# Patient Record
Sex: Female | Born: 1950
Health system: Southern US, Community
[De-identification: ages and names within clinical notes are randomized; demographics above are authoritative.]

## PROBLEM LIST (undated history)

## (undated) DIAGNOSIS — N189 Chronic kidney disease, unspecified: Secondary | ICD-10-CM

## (undated) DIAGNOSIS — H269 Unspecified cataract: Secondary | ICD-10-CM

## (undated) DIAGNOSIS — L309 Dermatitis, unspecified: Secondary | ICD-10-CM

## (undated) DIAGNOSIS — R0600 Dyspnea, unspecified: Secondary | ICD-10-CM

## (undated) DIAGNOSIS — D649 Anemia, unspecified: Secondary | ICD-10-CM

## (undated) DIAGNOSIS — M5090 Cervical disc disorder, unspecified, unspecified cervical region: Secondary | ICD-10-CM

## (undated) DIAGNOSIS — J302 Other seasonal allergic rhinitis: Secondary | ICD-10-CM

## (undated) DIAGNOSIS — I10 Essential (primary) hypertension: Secondary | ICD-10-CM

## (undated) DIAGNOSIS — H409 Unspecified glaucoma: Secondary | ICD-10-CM

## (undated) DIAGNOSIS — E041 Nontoxic single thyroid nodule: Secondary | ICD-10-CM

## (undated) DIAGNOSIS — E785 Hyperlipidemia, unspecified: Secondary | ICD-10-CM

## (undated) DIAGNOSIS — E119 Type 2 diabetes mellitus without complications: Secondary | ICD-10-CM

## (undated) DIAGNOSIS — Z8619 Personal history of other infectious and parasitic diseases: Secondary | ICD-10-CM

## (undated) DIAGNOSIS — R011 Cardiac murmur, unspecified: Secondary | ICD-10-CM

## (undated) DIAGNOSIS — M1990 Unspecified osteoarthritis, unspecified site: Secondary | ICD-10-CM

## (undated) HISTORY — PX: UPPER GI ENDOSCOPY: SHX6162

## (undated) HISTORY — PX: TUBAL LIGATION: SHX77

## (undated) HISTORY — DX: Type 2 diabetes mellitus without complications: E11.9

## (undated) HISTORY — DX: Nontoxic single thyroid nodule: E04.1

## (undated) HISTORY — DX: Personal history of other infectious and parasitic diseases: Z86.19

## (undated) HISTORY — PX: TONSILLECTOMY: SUR1361

## (undated) HISTORY — DX: Cervical disc disorder, unspecified, unspecified cervical region: M50.90

## (undated) HISTORY — PX: COLONOSCOPY: SHX174

---

## 1997-07-03 ENCOUNTER — Other Ambulatory Visit: Admission: RE | Admit: 1997-07-03 | Discharge: 1997-07-03 | Payer: Self-pay | Admitting: Family Medicine

## 1997-07-11 ENCOUNTER — Ambulatory Visit (HOSPITAL_COMMUNITY): Admission: RE | Admit: 1997-07-11 | Discharge: 1997-07-11 | Payer: Self-pay | Admitting: Family Medicine

## 1997-09-08 ENCOUNTER — Other Ambulatory Visit: Admission: RE | Admit: 1997-09-08 | Discharge: 1997-09-08 | Payer: Self-pay | Admitting: Family Medicine

## 2000-06-17 ENCOUNTER — Ambulatory Visit (HOSPITAL_COMMUNITY): Admission: RE | Admit: 2000-06-17 | Discharge: 2000-06-17 | Payer: Self-pay | Admitting: Gastroenterology

## 2000-06-25 ENCOUNTER — Ambulatory Visit (HOSPITAL_COMMUNITY): Admission: RE | Admit: 2000-06-25 | Discharge: 2000-06-25 | Payer: Self-pay | Admitting: Family Medicine

## 2000-06-25 ENCOUNTER — Encounter: Payer: Self-pay | Admitting: Family Medicine

## 2001-06-20 ENCOUNTER — Encounter: Admission: RE | Admit: 2001-06-20 | Discharge: 2001-09-18 | Payer: Self-pay | Admitting: Family Medicine

## 2001-06-27 ENCOUNTER — Ambulatory Visit (HOSPITAL_COMMUNITY): Admission: RE | Admit: 2001-06-27 | Discharge: 2001-06-27 | Payer: Self-pay | Admitting: Family Medicine

## 2001-06-27 ENCOUNTER — Encounter: Payer: Self-pay | Admitting: Family Medicine

## 2001-10-06 ENCOUNTER — Encounter: Admission: RE | Admit: 2001-10-06 | Discharge: 2001-10-18 | Payer: Self-pay | Admitting: Family Medicine

## 2003-11-22 ENCOUNTER — Emergency Department (HOSPITAL_COMMUNITY): Admission: EM | Admit: 2003-11-22 | Discharge: 2003-11-22 | Payer: Self-pay | Admitting: Emergency Medicine

## 2004-07-01 ENCOUNTER — Ambulatory Visit (HOSPITAL_COMMUNITY): Admission: RE | Admit: 2004-07-01 | Discharge: 2004-07-01 | Payer: Self-pay | Admitting: Internal Medicine

## 2004-07-17 ENCOUNTER — Encounter: Admission: RE | Admit: 2004-07-17 | Discharge: 2004-07-17 | Payer: Self-pay | Admitting: Internal Medicine

## 2006-02-15 ENCOUNTER — Emergency Department (HOSPITAL_COMMUNITY): Admission: EM | Admit: 2006-02-15 | Discharge: 2006-02-15 | Payer: Self-pay | Admitting: Family Medicine

## 2006-09-16 ENCOUNTER — Encounter: Admission: RE | Admit: 2006-09-16 | Discharge: 2006-09-16 | Payer: Self-pay | Admitting: Internal Medicine

## 2007-02-02 ENCOUNTER — Emergency Department (HOSPITAL_COMMUNITY): Admission: EM | Admit: 2007-02-02 | Discharge: 2007-02-02 | Payer: Self-pay | Admitting: Emergency Medicine

## 2008-07-12 ENCOUNTER — Encounter: Admission: RE | Admit: 2008-07-12 | Discharge: 2008-07-12 | Payer: Self-pay | Admitting: Internal Medicine

## 2009-07-29 ENCOUNTER — Encounter: Admission: RE | Admit: 2009-07-29 | Discharge: 2009-10-04 | Payer: Self-pay | Admitting: Internal Medicine

## 2009-10-24 ENCOUNTER — Encounter: Admission: RE | Admit: 2009-10-24 | Discharge: 2009-10-24 | Payer: Self-pay | Admitting: Internal Medicine

## 2010-01-26 ENCOUNTER — Encounter: Payer: Self-pay | Admitting: Internal Medicine

## 2010-01-27 ENCOUNTER — Encounter: Payer: Self-pay | Admitting: Internal Medicine

## 2010-05-23 NOTE — Procedures (Signed)
[facility]  Patient:    Michelle Cummings, Michelle Cummings                         MRN: [id number] Proc. Date: 06/17/00 Adm. Date:  [id number] Attending:  Juanita Craver CC:         Emeline General. Dema Severin, M.D.   Procedure Report  DATE OF BIRTH:  1950-02-17.  REFERRING PHYSICIAN:  Emeline General. Dema Severin, M.D.  PROCEDURE PERFORMED:  Colonoscopy.  ENDOSCOPIST:  Nelwyn Salisbury, M.D.  INSTRUMENT USED:  Olympus video colonoscope.  INDICATIONS FOR PROCEDURE:  The patient is a 60 year old African-American female with a history of adenomatous polyps removed in the past.  Repeat colorectal cancer screening is being done to rule out recurrent polyps.  PREPROCEDURE PREPARATION:  Informed consent was procured from the patient. The patient was fasted for eight hours prior to the procedure and prepped with a bottle of magnesium citrate and a gallon of NuLytely the night prior to the procedure.  PREPROCEDURE PHYSICAL:  The patient had stable vital signs.  Neck supple. Chest clear to auscultation.  S1, S2 regular.  Abdomen soft with normal abdominal bowel sounds.  DESCRIPTION OF PROCEDURE:  The patient was placed in the left lateral decubitus position and sedated with 60 mg of Demerol and 7.5 mg of Versed intravenously.  Once the patient was adequately sedated and maintained on low-flow oxygen and continuous cardiac monitoring, the Olympus video colonoscope was advanced from the rectum to the cecum with slight difficulty secondary to some residual stool in the colon especially the transverse and right colon.  No masses, polyps, erosions, ulcerations or diverticula were seen.  Small lesions could have been missed.  The patient tolerated the procedure well without complication.  IMPRESSION: 1. Healthy-appearing colon except for some residual stool in the colon. 2. No large masses or polyps seen. 3. Very small lesions may have been missed.  RECOMMENDATIONS: 1. Repeat colorectal  cancer screening is recommended in the next five years. 2. A high fiber diet has been advised. 3. Outpatient follow-up is advised on a p.r.n. basis.DD:  06/17/00 TD:  06/18/00 Job: [id number]

## 2010-05-23 NOTE — Consult Note (Signed)
NAMEMIRIAN, CAISSIE NO.:  [id number]   MEDICAL RECORD NO.:  [id number]          PATIENT TYPE:  EMS   LOCATION:  ED                           FACILITY:  [facility]   PHYSICIAN:  Anderson Malta, M.D.    DATE OF BIRTH:  20-Feb-1950   DATE OF CONSULTATION:  11/22/2003  DATE OF DISCHARGE:                                   CONSULTATION   CHIEF COMPLAINT:  Right ankle pain.   HISTORY OF PRESENT ILLNESS:  Michelle Cummings is a 60 year old African-American  female, who fell today while she was walking in her wet leaves.  She reports  right ankle pain.  She denies any other orthopedic complaints.  She denies  any loss of consciousness.   CURRENT MEDICATIONS:  Amaryl, Lotrel, and Glucophage.   ALLERGIES:  She has no known drug allergies.   PAST MEDICAL AND SURGICAL HISTORY:  Notable for hypertension and colon  polyps.   SOCIAL HISTORY:  The patient does not smoke or drink.  She lives with her  husband in [place].  She has good family support with at least one  daughter in the area.   PHYSICAL EXAMINATION:  She has full range of motion of the upper extremities  and left lower extremity.  She has no neck pain or tenderness.  Her right  foot exam demonstrates intact sensation of the dorsal aspect of the foot,  palpable pedal pulses, swelling on the lateral aspect of the ankle with mild  tenderness, but there is no medial-sided tenderness.  She has good  dorsiflexion and plantar flexion of the toes.  Compartments are otherwise  soft.  No other masses, lymphadenopathy, or skin changes are noted.  Radiographs demonstrate Weber B ankle fracture with about 2 mm of  displacement.  The ankle mortis is symmetric.  Medial clear space is not  widened.   IMPRESSION:  Right ankle fracture.   PLAN:  The patient's right ankle was manipulated with reversal of her  supination, external rotation mechanism.  The patient's foot was pronated  and internally rotated.  Posterior splint was  applied.  Postreduction  radiographs demonstrate improvement in the alignment.  Because she is  diabetic, I would favor at this time nonoperative management with a period  of nonweightbearing.  I am going to see her back in the clinic in a week,  and we will proceed from there.  I am going to put her on Percocet for pain  as well as aspirin for DVT prophylaxis.  She will be nonweightbearing with  crutches.     GSD/MEDQ  D:  11/22/2003  T:  11/22/2003  Job:  [id number]   cc:   Dr. Rolin Barry

## 2010-05-23 NOTE — Op Note (Signed)
NAMEBREANNA, POULOS NO.:  [id number]   MEDICAL RECORD NO.:  [id number]          PATIENT TYPE:  EMS   LOCATION:  ED                           FACILITY:  [facility]   PHYSICIAN:  Anderson Malta, M.D.    DATE OF BIRTH:  Jun 21, 1950   DATE OF PROCEDURE:  11/22/2003  DATE OF DISCHARGE:                                 OPERATIVE REPORT   PREOPERATIVE DIAGNOSIS:  Right ankle fracture.   POSTOPERATIVE DIAGNOSIS:  Right ankle fracture.   PROCEDURE:  Closed reduction of right ankle fracture.   SURGEON:  Anderson Malta, M.D.   PROCEDURE:  Patient was given 4 mg of IV morphine.  The posterior splint  material was applied.  Using a reduction maneuver of pronation and internal  rotation via supination and external rotation, the ankle fracture was  reduced.  The plaster was allowed to harden while the foot was held in  internal rotation and pronation.  Post-reduction x-ray showed improvement in  the alignment of the fracture.  The patient tolerated the procedure well  without immediate complication.      GSD/MEDQ  D:  11/22/2003  T:  11/22/2003  Job:  [id number]

## 2011-03-05 ENCOUNTER — Encounter (HOSPITAL_COMMUNITY): Payer: Self-pay | Admitting: Internal Medicine

## 2011-03-05 ENCOUNTER — Encounter (HOSPITAL_COMMUNITY): Payer: Self-pay | Admitting: Certified Registered"

## 2011-03-05 ENCOUNTER — Emergency Department (HOSPITAL_COMMUNITY): Payer: BC Managed Care – PPO

## 2011-03-05 ENCOUNTER — Encounter (HOSPITAL_COMMUNITY)
Admission: EM | Disposition: A | Discharge status: Home / Self Care | Payer: Self-pay | Source: Ambulatory Visit | Attending: Emergency Medicine

## 2011-03-05 ENCOUNTER — Observation Stay (HOSPITAL_COMMUNITY)
Admission: EM | Admit: 2011-03-05 | Discharge: 2011-03-07 | DRG: 226 | Disposition: A | Discharge status: Home / Self Care | Payer: BC Managed Care – PPO | Source: Ambulatory Visit | Attending: Internal Medicine | Admitting: Internal Medicine

## 2011-03-05 ENCOUNTER — Inpatient Hospital Stay (HOSPITAL_COMMUNITY): Payer: BC Managed Care – PPO | Admitting: Certified Registered"

## 2011-03-05 ENCOUNTER — Other Ambulatory Visit: Payer: Self-pay

## 2011-03-05 DIAGNOSIS — R55 Syncope and collapse: Secondary | ICD-10-CM | POA: Diagnosis present

## 2011-03-05 DIAGNOSIS — S51009A Unspecified open wound of unspecified elbow, initial encounter: Principal | ICD-10-CM | POA: Insufficient documentation

## 2011-03-05 DIAGNOSIS — E119 Type 2 diabetes mellitus without complications: Secondary | ICD-10-CM | POA: Insufficient documentation

## 2011-03-05 DIAGNOSIS — S41112A Laceration without foreign body of left upper arm, initial encounter: Secondary | ICD-10-CM

## 2011-03-05 DIAGNOSIS — R197 Diarrhea, unspecified: Secondary | ICD-10-CM

## 2011-03-05 DIAGNOSIS — N183 Chronic kidney disease, stage 3 unspecified: Secondary | ICD-10-CM | POA: Diagnosis present

## 2011-03-05 DIAGNOSIS — W19XXXA Unspecified fall, initial encounter: Secondary | ICD-10-CM | POA: Insufficient documentation

## 2011-03-05 DIAGNOSIS — S51002A Unspecified open wound of left elbow, initial encounter: Secondary | ICD-10-CM | POA: Diagnosis present

## 2011-03-05 DIAGNOSIS — Z23 Encounter for immunization: Secondary | ICD-10-CM | POA: Insufficient documentation

## 2011-03-05 DIAGNOSIS — R110 Nausea: Secondary | ICD-10-CM | POA: Insufficient documentation

## 2011-03-05 DIAGNOSIS — S46909A Unspecified injury of unspecified muscle, fascia and tendon at shoulder and upper arm level, unspecified arm, initial encounter: Secondary | ICD-10-CM | POA: Insufficient documentation

## 2011-03-05 DIAGNOSIS — E1122 Type 2 diabetes mellitus with diabetic chronic kidney disease: Secondary | ICD-10-CM | POA: Diagnosis present

## 2011-03-05 DIAGNOSIS — I10 Essential (primary) hypertension: Secondary | ICD-10-CM

## 2011-03-05 DIAGNOSIS — E041 Nontoxic single thyroid nodule: Secondary | ICD-10-CM | POA: Diagnosis present

## 2011-03-05 DIAGNOSIS — Y92009 Unspecified place in unspecified non-institutional (private) residence as the place of occurrence of the external cause: Secondary | ICD-10-CM | POA: Insufficient documentation

## 2011-03-05 HISTORY — DX: Essential (primary) hypertension: I10

## 2011-03-05 HISTORY — PX: ORIF ELBOW FRACTURE: SHX5031

## 2011-03-05 LAB — POCT I-STAT, CHEM 8
BUN: 29 mg/dL — ABNORMAL HIGH (ref 6–23)
Calcium, Ion: 1.28 mmol/L (ref 1.12–1.32)
Chloride: 111 mEq/L (ref 96–112)
Creatinine, Ser: 0.9 mg/dL (ref 0.50–1.10)
Glucose, Bld: 249 mg/dL — ABNORMAL HIGH (ref 70–99)
HCT: 40 % (ref 36.0–46.0)
Hemoglobin: 13.6 g/dL (ref 12.0–15.0)
Potassium: 4.6 mEq/L (ref 3.5–5.1)
Sodium: 137 mEq/L (ref 135–145)
TCO2: 19 mmol/L (ref 0–100)

## 2011-03-05 LAB — GLUCOSE, CAPILLARY
Glucose-Capillary: 181 mg/dL — ABNORMAL HIGH (ref 70–99)
Glucose-Capillary: 167 mg/dL — ABNORMAL HIGH (ref 70–99)
Glucose-Capillary: 150 mg/dL — ABNORMAL HIGH (ref 70–99)
Glucose-Capillary: 156 mg/dL — ABNORMAL HIGH (ref 70–99)

## 2011-03-05 LAB — DIFFERENTIAL
Basophils Absolute: 0 10*3/uL (ref 0.0–0.1)
Basophils Relative: 0 % (ref 0–1)
Eosinophils Absolute: 0.2 10*3/uL (ref 0.0–0.7)
Eosinophils Relative: 1 % (ref 0–5)
Lymphocytes Relative: 14 % (ref 12–46)
Lymphs Abs: 1.4 10*3/uL (ref 0.7–4.0)
Monocytes Absolute: 1.1 10*3/uL — ABNORMAL HIGH (ref 0.1–1.0)
Monocytes Relative: 10 % (ref 3–12)
Neutro Abs: 8 10*3/uL — ABNORMAL HIGH (ref 1.7–7.7)
Neutrophils Relative %: 75 % (ref 43–77)

## 2011-03-05 LAB — COMPREHENSIVE METABOLIC PANEL
ALT: 32 U/L (ref 0–35)
AST: 15 U/L (ref 0–37)
Albumin: 3.3 g/dL — ABNORMAL LOW (ref 3.5–5.2)
Alkaline Phosphatase: 88 U/L (ref 39–117)
BUN: 26 mg/dL — ABNORMAL HIGH (ref 6–23)
CO2: 16 mEq/L — ABNORMAL LOW (ref 19–32)
Calcium: 9.3 mg/dL (ref 8.4–10.5)
Chloride: 108 mEq/L (ref 96–112)
Creatinine, Ser: 0.84 mg/dL (ref 0.50–1.10)
GFR calc Af Amer: 85 mL/min — ABNORMAL LOW (ref >90)
GFR calc non Af Amer: 73 mL/min — ABNORMAL LOW (ref >90)
Glucose, Bld: 179 mg/dL — ABNORMAL HIGH (ref 70–99)
Potassium: 4.2 mEq/L (ref 3.5–5.1)
Sodium: 136 mEq/L (ref 135–145)
Total Bilirubin: 0.2 mg/dL — ABNORMAL LOW (ref 0.3–1.2)
Total Protein: 7 g/dL (ref 6.0–8.3)

## 2011-03-05 LAB — CBC
HCT: 37.2 % (ref 36.0–46.0)
HCT: 34.5 % — ABNORMAL LOW (ref 36.0–46.0)
Hemoglobin: 12.1 g/dL (ref 12.0–15.0)
Hemoglobin: 10.7 g/dL — ABNORMAL LOW (ref 12.0–15.0)
MCH: 26.7 pg (ref 26.0–34.0)
MCH: 25.5 pg — ABNORMAL LOW (ref 26.0–34.0)
MCHC: 32.5 g/dL (ref 30.0–36.0)
MCHC: 31 g/dL (ref 30.0–36.0)
MCV: 81.9 fL (ref 78.0–100.0)
MCV: 82.3 fL (ref 78.0–100.0)
Platelets: 367 10*3/uL (ref 150–400)
Platelets: 315 10*3/uL (ref 150–400)
RBC: 4.54 MIL/uL (ref 3.87–5.11)
RBC: 4.19 MIL/uL (ref 3.87–5.11)
RDW: 13.9 % (ref 11.5–15.5)
RDW: 14.1 % (ref 11.5–15.5)
WBC: 10.6 10*3/uL — ABNORMAL HIGH (ref 4.0–10.5)
WBC: 7.2 10*3/uL (ref 4.0–10.5)

## 2011-03-05 LAB — TSH: TSH: 0.794 u[IU]/mL (ref 0.350–4.500)

## 2011-03-05 LAB — POCT I-STAT TROPONIN I: Troponin i, poc: 0 ng/mL (ref 0.00–0.08)

## 2011-03-05 SURGERY — OPEN REDUCTION INTERNAL FIXATION (ORIF) ELBOW/OLECRANON FRACTURE
Anesthesia: General | Site: Elbow | Laterality: Left | Wound class: Dirty or Infected

## 2011-03-05 MED ORDER — FENTANYL CITRATE 0.05 MG/ML IJ SOLN
50.0000 ug | Freq: Once | INTRAMUSCULAR | Status: AC
  Administered 2011-03-05: 50 ug via INTRAVENOUS

## 2011-03-05 MED ORDER — MEPERIDINE HCL 25 MG/ML IJ SOLN: 6.2500 mg | INTRAMUSCULAR | Status: DC | PRN

## 2011-03-05 MED ORDER — INSULIN ASPART 100 UNIT/ML ~~LOC~~ SOLN
0.0000 [IU] | Freq: Three times a day (TID) | SUBCUTANEOUS | Status: DC
  Administered 2011-03-06: 3 [IU] via SUBCUTANEOUS
  Filled 2011-03-05: qty 3

## 2011-03-05 MED ORDER — OXYCODONE-ACETAMINOPHEN 5-325 MG PO TABS
1.0000 | ORAL_TABLET | ORAL | Status: DC | PRN
  Administered 2011-03-05 – 2011-03-06 (×2): 2 via ORAL
  Filled 2011-03-05 (×2): qty 2

## 2011-03-05 MED ORDER — SODIUM CHLORIDE 0.9 % IV BOLUS (SEPSIS)
1000.0000 mL | Freq: Once | INTRAVENOUS | Status: AC
  Administered 2011-03-05: 1000 mL via INTRAVENOUS

## 2011-03-05 MED ORDER — LIDOCAINE HCL 4 % EX SOLN
CUTANEOUS | Status: AC
  Filled 2011-03-05: qty 100

## 2011-03-05 MED ORDER — PHENYLEPHRINE HCL 10 MG/ML IJ SOLN
INTRAMUSCULAR | Status: DC | PRN
  Administered 2011-03-05: 80 ug via INTRAVENOUS
  Administered 2011-03-05: 40 ug via INTRAVENOUS
  Administered 2011-03-05: 80 ug via INTRAVENOUS

## 2011-03-05 MED ORDER — ONDANSETRON HCL 4 MG/2ML IJ SOLN
INTRAMUSCULAR | Status: DC | PRN
  Administered 2011-03-05: 4 mg via INTRAVENOUS

## 2011-03-05 MED ORDER — CEFAZOLIN SODIUM 1-5 GM-% IV SOLN
1.0000 g | Freq: Three times a day (TID) | INTRAVENOUS | Status: AC
  Administered 2011-03-05 – 2011-03-06 (×3): 1 g via INTRAVENOUS
  Filled 2011-03-05 (×4): qty 50

## 2011-03-05 MED ORDER — PROPOFOL 10 MG/ML IV EMUL
INTRAVENOUS | Status: DC | PRN
  Administered 2011-03-05: 160 mg via INTRAVENOUS
  Administered 2011-03-05: 40 mg via INTRAVENOUS

## 2011-03-05 MED ORDER — FENTANYL CITRATE 0.05 MG/ML IJ SOLN
INTRAMUSCULAR | Status: AC
  Filled 2011-03-05: qty 2

## 2011-03-05 MED ORDER — LIDOCAINE-EPINEPHRINE 1 %-1:100000 IJ SOLN
INTRAMUSCULAR | Status: AC
  Administered 2011-03-05: 05:00:00
  Filled 2011-03-05: qty 2

## 2011-03-05 MED ORDER — CEFAZOLIN SODIUM 1-5 GM-% IV SOLN
INTRAVENOUS | Status: AC
  Filled 2011-03-05: qty 50

## 2011-03-05 MED ORDER — LABETALOL HCL 5 MG/ML IV SOLN
10.0000 mg | INTRAVENOUS | Status: DC | PRN
  Administered 2011-03-05: 10 mg via INTRAVENOUS
  Filled 2011-03-05: qty 4

## 2011-03-05 MED ORDER — SODIUM CHLORIDE 0.9 % IJ SOLN: 3.0000 mL | Freq: Two times a day (BID) | INTRAMUSCULAR | Status: DC

## 2011-03-05 MED ORDER — ACETAMINOPHEN 650 MG RE SUPP: 650.0000 mg | Freq: Four times a day (QID) | RECTAL | Status: DC | PRN

## 2011-03-05 MED ORDER — MIDAZOLAM HCL 2 MG/2ML IJ SOLN: 2.0000 mg | INTRAMUSCULAR | Status: DC | PRN

## 2011-03-05 MED ORDER — 0.9 % SODIUM CHLORIDE (POUR BTL) OPTIME
TOPICAL | Status: DC | PRN
  Administered 2011-03-05: 1000 mL

## 2011-03-05 MED ORDER — FENTANYL CITRATE 0.05 MG/ML IJ SOLN: 100.0000 ug | INTRAMUSCULAR | Status: DC | PRN

## 2011-03-05 MED ORDER — ONDANSETRON HCL 4 MG/2ML IJ SOLN: 4.0000 mg | Freq: Four times a day (QID) | INTRAMUSCULAR | Status: DC | PRN

## 2011-03-05 MED ORDER — MIDAZOLAM HCL 5 MG/5ML IJ SOLN
INTRAMUSCULAR | Status: DC | PRN
  Administered 2011-03-05: 2 mg via INTRAVENOUS

## 2011-03-05 MED ORDER — SODIUM CHLORIDE 0.9 % IV SOLN
INTRAVENOUS | Status: DC | PRN
  Administered 2011-03-05 (×2): via INTRAVENOUS

## 2011-03-05 MED ORDER — TETANUS-DIPHTH-ACELL PERTUSSIS 5-2.5-18.5 LF-MCG/0.5 IM SUSP
0.5000 mL | Freq: Once | INTRAMUSCULAR | Status: AC
  Administered 2011-03-05: 0.5 mL via INTRAMUSCULAR
  Filled 2011-03-05: qty 0.5

## 2011-03-05 MED ORDER — FENTANYL CITRATE 0.05 MG/ML IJ SOLN
INTRAMUSCULAR | Status: DC | PRN
  Administered 2011-03-05 (×4): 50 ug via INTRAVENOUS

## 2011-03-05 MED ORDER — HYDROCODONE-ACETAMINOPHEN 5-325 MG PO TABS
1.0000 | ORAL_TABLET | ORAL | Status: DC | PRN
Start: 2011-03-05 — End: 2011-03-07
  Administered 2011-03-06: 2 via ORAL
  Filled 2011-03-05: qty 2

## 2011-03-05 MED ORDER — HYDROMORPHONE HCL PF 1 MG/ML IJ SOLN
0.2500 mg | INTRAMUSCULAR | Status: DC | PRN
Start: 2011-03-05 — End: 2011-03-05
  Administered 2011-03-05 (×2): 0.5 mg via INTRAVENOUS

## 2011-03-05 MED ORDER — MORPHINE SULFATE 2 MG/ML IJ SOLN: 1.0000 mg | INTRAMUSCULAR | Status: DC | PRN

## 2011-03-05 MED ORDER — SODIUM CHLORIDE 0.9 % IV SOLN
INTRAVENOUS | Status: DC
  Administered 2011-03-05 – 2011-03-07 (×5): via INTRAVENOUS

## 2011-03-05 MED ORDER — FENTANYL CITRATE 0.05 MG/ML IJ SOLN
100.0000 ug | Freq: Once | INTRAMUSCULAR | Status: AC
  Administered 2011-03-05: 100 ug via INTRAVENOUS

## 2011-03-05 MED ORDER — ONDANSETRON HCL 4 MG PO TABS: 4.0000 mg | ORAL_TABLET | Freq: Four times a day (QID) | ORAL | Status: DC | PRN

## 2011-03-05 MED ORDER — CEFAZOLIN SODIUM 1-5 GM-% IV SOLN: 1.0000 g | INTRAVENOUS | Status: DC

## 2011-03-05 MED ORDER — CEFAZOLIN SODIUM 1-5 GM-% IV SOLN
1.0000 g | Freq: Once | INTRAVENOUS | Status: AC
  Administered 2011-03-05 (×2): 1 g via INTRAVENOUS
  Filled 2011-03-05: qty 50

## 2011-03-05 MED ORDER — ACETAMINOPHEN 325 MG PO TABS
650.0000 mg | ORAL_TABLET | Freq: Four times a day (QID) | ORAL | Status: DC | PRN
  Administered 2011-03-05: 650 mg via ORAL
  Filled 2011-03-05: qty 2

## 2011-03-05 MED ORDER — BUPIVACAINE HCL (PF) 0.25 % IJ SOLN
INTRAMUSCULAR | Status: DC | PRN
  Administered 2011-03-05: 10 mL

## 2011-03-05 SURGICAL SUPPLY — 58 items
ANCHOR SCREW TI W/SUT 3 (Screw) ×4 IMPLANT
BANDAGE ELASTIC 3 VELCRO ST LF (GAUZE/BANDAGES/DRESSINGS) ×2 IMPLANT
BANDAGE ELASTIC 4 VELCRO ST LF (GAUZE/BANDAGES/DRESSINGS) ×2 IMPLANT
BANDAGE GAUZE ELAST BULKY 4 IN (GAUZE/BANDAGES/DRESSINGS) ×2 IMPLANT
BNDG COHESIVE 4X5 TAN STRL (GAUZE/BANDAGES/DRESSINGS) ×2 IMPLANT
BNDG ESMARK 4X9 LF (GAUZE/BANDAGES/DRESSINGS) ×2 IMPLANT
CLOTH BEACON ORANGE TIMEOUT ST (SAFETY) ×2 IMPLANT
CORDS BIPOLAR (ELECTRODE) ×2 IMPLANT
COVER MAYO STAND STRL (DRAPES) IMPLANT
COVER SURGICAL LIGHT HANDLE (MISCELLANEOUS) ×4 IMPLANT
CUFF TOURNIQUET SINGLE 18IN (TOURNIQUET CUFF) ×2 IMPLANT
CUFF TOURNIQUET SINGLE 24IN (TOURNIQUET CUFF) IMPLANT
DRAPE INCISE IOBAN 66X45 STRL (DRAPES) IMPLANT
DRAPE OEC MINIVIEW 54X84 (DRAPES) IMPLANT
DRAPE ORTHO SPLIT 77X108 STRL (DRAPES) ×1
DRAPE SURG ORHT 6 SPLT 77X108 (DRAPES) ×1 IMPLANT
DRAPE U-SHAPE 47X51 STRL (DRAPES) ×2 IMPLANT
DRILL BIT 5/64 (BIT) ×2 IMPLANT
DRSG ADAPTIC 3X8 NADH LF (GAUZE/BANDAGES/DRESSINGS) ×2 IMPLANT
GLOVE BIOGEL PI IND STRL 7.0 (GLOVE) ×1 IMPLANT
GLOVE BIOGEL PI IND STRL 8 (GLOVE) ×1 IMPLANT
GLOVE BIOGEL PI IND STRL 8.5 (GLOVE) ×1 IMPLANT
GLOVE BIOGEL PI INDICATOR 7.0 (GLOVE) ×1
GLOVE BIOGEL PI INDICATOR 8 (GLOVE) ×1
GLOVE BIOGEL PI INDICATOR 8.5 (GLOVE) ×1
GLOVE ECLIPSE 6.5 STRL STRAW (GLOVE) ×2 IMPLANT
GLOVE EXAM NITRILE MD LF STRL (GLOVE) ×2 IMPLANT
GLOVE SURG ORTHO 8.0 STRL STRW (GLOVE) ×2 IMPLANT
GLOVE SURG SS PI 7.5 STRL IVOR (GLOVE) ×2 IMPLANT
GOWN PREVENTION PLUS XLARGE (GOWN DISPOSABLE) ×2 IMPLANT
GOWN SRG XL XLNG 56XLVL 4 (GOWN DISPOSABLE) ×1 IMPLANT
GOWN STRL NON-REIN LRG LVL3 (GOWN DISPOSABLE) ×2 IMPLANT
GOWN STRL NON-REIN XL XLG LVL4 (GOWN DISPOSABLE) ×1
KIT BASIN OR (CUSTOM PROCEDURE TRAY) ×2 IMPLANT
KIT ROOM TURNOVER OR (KITS) ×2 IMPLANT
LOOP VESSEL MAXI BLUE (MISCELLANEOUS) ×2 IMPLANT
MANIFOLD NEPTUNE II (INSTRUMENTS) ×2 IMPLANT
NEEDLE HYPO 25GX1X1/2 BEV (NEEDLE) ×2 IMPLANT
NS IRRIG 1000ML POUR BTL (IV SOLUTION) ×2 IMPLANT
PACK ORTHO EXTREMITY (CUSTOM PROCEDURE TRAY) ×2 IMPLANT
PAD ARMBOARD 7.5X6 YLW CONV (MISCELLANEOUS) ×4 IMPLANT
PAD CAST 4YDX4 CTTN HI CHSV (CAST SUPPLIES) ×1 IMPLANT
PADDING CAST COTTON 4X4 STRL (CAST SUPPLIES) ×1
SOAP 2 % CHG 4 OZ (WOUND CARE) ×2 IMPLANT
SPONGE GAUZE 4X4 12PLY (GAUZE/BANDAGES/DRESSINGS) ×2 IMPLANT
STAPLER VISISTAT 35W (STAPLE) ×2 IMPLANT
SUCTION FRAZIER TIP 10 FR DISP (SUCTIONS) ×2 IMPLANT
SUT MERSILENE 4 0 P 3 (SUTURE) IMPLANT
SUT PROLENE 3 0 PS 2 (SUTURE) ×4 IMPLANT
SUT PROLENE 4 0 PS 2 18 (SUTURE) IMPLANT
SUT VIC AB 2-0 CT1 27 (SUTURE)
SUT VIC AB 2-0 CT1 TAPERPNT 27 (SUTURE) IMPLANT
SYR CONTROL 10ML LL (SYRINGE) ×2 IMPLANT
TOWEL OR 17X24 6PK STRL BLUE (TOWEL DISPOSABLE) ×2 IMPLANT
TOWEL OR 17X26 10 PK STRL BLUE (TOWEL DISPOSABLE) ×4 IMPLANT
TUBE CONNECTING 12X1/4 (SUCTIONS) ×2 IMPLANT
UNDERPAD 30X30 INCONTINENT (UNDERPADS AND DIAPERS) ×2 IMPLANT
WATER STERILE IRR 1000ML POUR (IV SOLUTION) IMPLANT

## 2011-03-05 NOTE — Brief Op Note (Signed)
03/05/2011  4:06 PM  PATIENT:  Michelle Cummings  61 y.o. female  PRE-OPERATIVE DIAGNOSIS:  Elbow repair tendon laceration  POST-OPERATIVE DIAGNOSIS:  Elbow repair tendon laceration  PROCEDURE:  Procedure(s) (LRB): Open laceration repair and triceps tendon repair  SURGEON:  Surgeon(s) and Role:    * Linna Hoff, MD - Primary  PHYSICIAN ASSISTANT:   ASSISTANTS: none   ANESTHESIA:   general  EBL:  Total I/O In: 1000 [I.V.:1000] Out: -   BLOOD ADMINISTERED:none  DRAINS: none   LOCAL MEDICATIONS USED:  MARCAINE     SPECIMEN:  No Specimen  DISPOSITION OF SPECIMEN:  N/A  COUNTS:  YES  TOURNIQUET:  * Missing tourniquet times found for documented tourniquets in log:  26912 *  DICTATION: .Other Dictation: Dictation Number [id number]  PLAN OF CARE: Admit to inpatient   PATIENT DISPOSITION:  PACU - hemodynamically stable.   Delay start of Pharmacological VTE agent (>24hrs) due to surgical blood loss or risk of bleeding: not applicable

## 2011-03-05 NOTE — Anesthesia Postprocedure Evaluation (Signed)
  Anesthesia Post-op Note  Patient: Michelle Cummings  Procedure(s) Performed: Procedure(s) (LRB): OPEN REDUCTION INTERNAL FIXATION (ORIF) ELBOW/OLECRANON FRACTURE (Left)  Patient Location: PACU  Anesthesia Type: General  Level of Consciousness: awake and alert   Airway and Oxygen Therapy: Patient Spontanous Breathing and Patient connected to nasal cannula oxygen  Post-op Pain: mild  Post-op Assessment: Post-op Vital signs reviewed, Patient's Cardiovascular Status Stable, Respiratory Function Stable, Patent Airway and No signs of Nausea or vomiting  Post-op Vital Signs: Reviewed and stable  Complications: No apparent anesthesia complications

## 2011-03-05 NOTE — Anesthesia Preprocedure Evaluation (Addendum)
Anesthesia Evaluation  Patient identified by MRN, date of birth, ID band Patient awake    Reviewed: Allergy & Precautions, H&P , NPO status , Patient's Chart, lab work & pertinent test results  Airway Mallampati: II TM Distance: >3 FB Neck ROM: Full    Dental No notable dental hx. (+) Teeth Intact   Pulmonary neg pulmonary ROS,  clear to auscultation  Pulmonary exam normal       Cardiovascular hypertension, On Medications Regular Normal    Neuro/Psych Negative Neurological ROS  Negative Psych ROS   GI/Hepatic negative GI ROS, Neg liver ROS,   Endo/Other  Diabetes mellitus-, Well Controlled, Type 2, Oral Hypoglycemic Agents  Renal/GU negative Renal ROS  Genitourinary negative   Musculoskeletal   Abdominal   Peds  Hematology negative hematology ROS (+)   Anesthesia Other Findings   Reproductive/Obstetrics negative OB ROS                           Anesthesia Physical Anesthesia Plan  ASA: II  Anesthesia Plan: General   Post-op Pain Management:    Induction: Intravenous  Airway Management Planned: LMA  Additional Equipment:   Intra-op Plan:   Post-operative Plan: Extubation in OR  Informed Consent: I have reviewed the patients History and Physical, chart, labs and discussed the procedure including the risks, benefits and alternatives for the proposed anesthesia with the patient or authorized representative who has indicated his/her understanding and acceptance.     Plan Discussed with: CRNA  Anesthesia Plan Comments:         Anesthesia Quick Evaluation

## 2011-03-05 NOTE — Transfer of Care (Signed)
Immediate Anesthesia Transfer of Care Note  Patient: Michelle Cummings  Procedure(s) Performed: Procedure(s) (LRB): OPEN REDUCTION INTERNAL FIXATION (ORIF) ELBOW/OLECRANON FRACTURE (Left)  Patient Location: PACU  Anesthesia Type: General  Level of Consciousness: sedated  Airway & Oxygen Therapy: Patient Spontanous Breathing and Patient connected to nasal cannula oxygen  Post-op Assessment: Report given to PACU RN, Post -op Vital signs reviewed and stable and Patient moving all extremities X 4  Post vital signs: Reviewed and stable  Complications: No apparent anesthesia complications

## 2011-03-05 NOTE — Consult Note (Signed)
Reason for Consult:LACERATION TO LEFT ELBOW Referring Physician: [facility]  CHAREL TANCREDI is an 61 y.o. female.  HPI: SYNCOPAL EPISODE LAST NIGHT AND SUSTAINED LACERATION TO LEFT ELBOW PT HERE FOR SURGERY TODAY FOR LEFT ELBOW.  Past Medical History  Diagnosis Date  . Diabetes mellitus   . Hypertension     Past Surgical History  Procedure Date  . No past surgeries     History reviewed. No pertinent family history.  Social History:  reports that she has never smoked. She does not have any smokeless tobacco history on file. She reports that she does not drink alcohol. Her drug history not on file.  Allergies: No Known Allergies  Medications: I have reviewed the patient's current medications.  Results for orders placed during the hospital encounter of 03/05/11 (from the past 48 hour(s))  CBC     Status: Abnormal   Collection Time   03/05/11 12:25 AM      Component Value Range Comment   WBC 10.6 (*) 4.0 - 10.5 (K/uL)    RBC 4.54  3.87 - 5.11 (MIL/uL)    Hemoglobin 12.1  12.0 - 15.0 (g/dL)    HCT 37.2  36.0 - 46.0 (%)    MCV 81.9  78.0 - 100.0 (fL)    MCH 26.7  26.0 - 34.0 (pg)    MCHC 32.5  30.0 - 36.0 (g/dL)    RDW 13.9  11.5 - 15.5 (%)    Platelets 367  150 - 400 (K/uL)   DIFFERENTIAL     Status: Abnormal   Collection Time   03/05/11 12:25 AM      Component Value Range Comment   Neutrophils Relative 75  43 - 77 (%)    Neutro Abs 8.0 (*) 1.7 - 7.7 (K/uL)    Lymphocytes Relative 14  12 - 46 (%)    Lymphs Abs 1.4  0.7 - 4.0 (K/uL)    Monocytes Relative 10  3 - 12 (%)    Monocytes Absolute 1.1 (*) 0.1 - 1.0 (K/uL)    Eosinophils Relative 1  0 - 5 (%)    Eosinophils Absolute 0.2  0.0 - 0.7 (K/uL)    Basophils Relative 0  0 - 1 (%)    Basophils Absolute 0.0  0.0 - 0.1 (K/uL)   POCT I-STAT TROPONIN I     Status: Normal   Collection Time   03/05/11  3:39 AM      Component Value Range Comment   Troponin i, poc 0.00  0.00 - 0.08 (ng/mL)    Comment 3             POCT I-STAT, CHEM 8     Status: Abnormal   Collection Time   03/05/11  3:40 AM      Component Value Range Comment   Sodium 137  135 - 145 (mEq/L)    Potassium 4.6  3.5 - 5.1 (mEq/L)    Chloride 111  96 - 112 (mEq/L)    BUN 29 (*) 6 - 23 (mg/dL)    Creatinine, Ser 0.90  0.50 - 1.10 (mg/dL)    Glucose, Bld 249 (*) 70 - 99 (mg/dL)    Calcium, Ion 1.28  1.12 - 1.32 (mmol/L)    TCO2 19  0 - 100 (mmol/L)    Hemoglobin 13.6  12.0 - 15.0 (g/dL)    HCT 40.0  36.0 - 46.0 (%)   COMPREHENSIVE METABOLIC PANEL     Status: Abnormal   Collection Time  03/05/11  8:35 AM      Component Value Range Comment   Sodium 136  135 - 145 (mEq/L)    Potassium 4.2  3.5 - 5.1 (mEq/L)    Chloride 108  96 - 112 (mEq/L)    CO2 16 (*) 19 - 32 (mEq/L)    Glucose, Bld 179 (*) 70 - 99 (mg/dL)    BUN 26 (*) 6 - 23 (mg/dL)    Creatinine, Ser 0.84  0.50 - 1.10 (mg/dL)    Calcium 9.3  8.4 - 10.5 (mg/dL)    Total Protein 7.0  6.0 - 8.3 (g/dL)    Albumin 3.3 (*) 3.5 - 5.2 (g/dL)    AST 15  0 - 37 (U/L)    ALT 32  0 - 35 (U/L)    Alkaline Phosphatase 88  39 - 117 (U/L)    Total Bilirubin 0.2 (*) 0.3 - 1.2 (mg/dL)    GFR calc non Af Amer 73 (*) >90 (mL/min)    GFR calc Af Amer 85 (*) >90 (mL/min)   CBC     Status: Abnormal   Collection Time   03/05/11  8:35 AM      Component Value Range Comment   WBC 7.2  4.0 - 10.5 (K/uL)    RBC 4.19  3.87 - 5.11 (MIL/uL)    Hemoglobin 10.7 (*) 12.0 - 15.0 (g/dL) DELTA CHECK NOTED   HCT 34.5 (*) 36.0 - 46.0 (%)    MCV 82.3  78.0 - 100.0 (fL)    MCH 25.5 (*) 26.0 - 34.0 (pg)    MCHC 31.0  30.0 - 36.0 (g/dL)    RDW 14.1  11.5 - 15.5 (%)    Platelets 315  150 - 400 (K/uL)   GLUCOSE, CAPILLARY     Status: Abnormal   Collection Time   03/05/11  9:08 AM      Component Value Range Comment   Glucose-Capillary 181 (*) 70 - 99 (mg/dL)    Comment 1 Notify RN     GLUCOSE, CAPILLARY     Status: Abnormal   Collection Time   03/05/11 12:05 PM      Component Value Range Comment    Glucose-Capillary 167 (*) 70 - 99 (mg/dL)    Comment 1 Notify RN       Dg Chest 2 View  03/05/2011  *RADIOLOGY REPORT*  Clinical Data: Fall and fainted.  CHEST - 2 VIEW  Comparison: None.  Findings: Two views of the chest demonstrate clear lungs. Heart and mediastinum are within normal limits.  The trachea is midline. Bony structures are intact.  IMPRESSION: No acute chest findings.  Original Report Authenticated By: Markus Daft, M.D.   Dg Elbow Complete Left  03/05/2011  *RADIOLOGY REPORT*  Clinical Data: Fall and laceration to the left elbow.  LEFT ELBOW - COMPLETE 3+ VIEW  Comparison: None.  Findings: Four views of the left elbow were obtained.  There is lucency posterior to the elbow which is likely within the subcutaneous tissues.  Difficult to evaluate for joint effusion. There may be a skin laceration near the olecranon.  There is no evidence for a displaced elbow fracture.  The elbow is located.  IMPRESSION: No evidence for a gross or displaced elbow fracture.  Limited evaluation for a joint effusion due to the laceration and subcutaneous gas.  Original Report Authenticated By: Markus Daft, M.D.   Ct Head Wo Contrast  03/05/2011  *RADIOLOGY REPORT*  Clinical Data:  Laceration and fall.  CT  HEAD WITHOUT CONTRAST CT CERVICAL SPINE WITHOUT CONTRAST  Technique:  Multidetector CT imaging of the head and cervical spine was performed following the standard protocol without intravenous contrast.  Multiplanar CT image reconstructions of the cervical spine were also generated.  Comparison:   None  CT HEAD  Findings: No evidence for acute hemorrhage, mass lesion, midline shift, hydrocephalus or large infarct.  No acute bony abnormality. The visualized sinuses are clear.  IMPRESSION: No acute intracranial abnormality.  CT CERVICAL SPINE  Findings: Negative for acute fracture or dislocation.  Lung apices are clear.  There is a heterogeneous nodule along the inferior thyroid that measures 3.5 x 2.4 cm.  Mild  cervical spondylosis in the lower cervical spine.  Normal alignment of the cervical spine.  IMPRESSION: No acute bony abnormality in the cervical spine.  Large inferior thyroid nodule measuring up to 3.5 cm.  Recommend ultrasound evaluation and possible biopsy of this lesion.  Original Report Authenticated By: Markus Daft, M.D.   Ct Cervical Spine Wo Contrast  03/05/2011  *RADIOLOGY REPORT*  Clinical Data:  Laceration and fall.  CT HEAD WITHOUT CONTRAST CT CERVICAL SPINE WITHOUT CONTRAST  Technique:  Multidetector CT imaging of the head and cervical spine was performed following the standard protocol without intravenous contrast.  Multiplanar CT image reconstructions of the cervical spine were also generated.  Comparison:   None  CT HEAD  Findings: No evidence for acute hemorrhage, mass lesion, midline shift, hydrocephalus or large infarct.  No acute bony abnormality. The visualized sinuses are clear.  IMPRESSION: No acute intracranial abnormality.  CT CERVICAL SPINE  Findings: Negative for acute fracture or dislocation.  Lung apices are clear.  There is a heterogeneous nodule along the inferior thyroid that measures 3.5 x 2.4 cm.  Mild cervical spondylosis in the lower cervical spine.  Normal alignment of the cervical spine.  IMPRESSION: No acute bony abnormality in the cervical spine.  Large inferior thyroid nodule measuring up to 3.5 cm.  Recommend ultrasound evaluation and possible biopsy of this lesion.  Original Report Authenticated By: Markus Daft, M.D.    @ROS @ Blood pressure 156/70, pulse 90, temperature 98.9 F (37.2 C), temperature source Oral, resp. rate 20, SpO2 98.00%. General Appearance:  Alert, cooperative, no distress, appears stated age  Head:  Normocephalic, without obvious abnormality, atraumatic  Eyes:  Pupils equal, conjunctiva/corneas clear,         Throat: Lips, mucosa, and tongue normal; teeth and gums normal  Neck: No visible masses     Lungs:   respirations unlabored  Chest  Wall:  No tenderness or deformity  Heart:  Regular rate and rhythm,  Abdomen:   Soft, non-tender,         Extremities: LEFT ELBOW IN DRESSING, I DID LOOK AT WOUND WHEN SHE CAME TO ED, LARGE LACERATION WITH LACERATION OF TRICEPS INSERTION AND TENDON ABLE TO FLEX AND EXTEND DIGITS FINGERS WARM WELL PERFUSED  Pulses: 2+ and symmetric  Skin: Skin color, texture, turgor normal, no rashes or lesions     Neurologic: Normal    Assessment/Plan: LEFT ELBOW LACERATION WITH TENDON INVOLVEMENT  TO OR FOR DEFINITIVE REPAIR OF ELBOW LACERATION AND REPAIR OF TRICEPS TENDON  R/B/A DISCUSSED WITH PT IN HOLDING AREA.  PT VOICED UNDERSTANDING OF PLAN CONSENT SIGNED DAY OF SURGERY PT SEEN AND EXAMINED PRIOR TO OPERATIVE PROCEDURE/DAY OF SURGERY SITE MARKED. QUESTIONS ANSWERED WILL REMAIN AN INPATIENT FOLLOWING SURGERY  Michelle Cummings 03/05/2011, 2:21 PM

## 2011-03-05 NOTE — ED Notes (Signed)
Cervical Collar removed per Dr. Lynnae January orders. Suture cart at bedside. Dr. Randal Buba aware. Will continue to monitor.

## 2011-03-05 NOTE — ED Provider Notes (Signed)
History     CSN: [id number]  Arrival date & time 03/05/11  0014   First MD Initiated Contact with Patient 03/05/11 0020      Chief Complaint  Patient presents with  . Near Syncope  . Laceration  . Fall    (Consider location/radiation/quality/duration/timing/severity/associated sxs/prior treatment) Patient is a 61 y.o. female presenting with skin laceration, fall, and syncope. The history is provided by the patient. No language interpreter was used.  Laceration  The incident occurred less than 1 hour ago. The laceration is located on the left arm. The laceration is 11-20 cm in size. The laceration mechanism was a broken glass. The pain is at a severity of 10/10. The pain is severe. The pain has been constant since onset. She reports no foreign bodies present. Her tetanus status is out of date.  Fall The accident occurred less than 1 hour ago. The fall occurred while walking. She fell from a height of 1 to 2 ft. She landed on grass. The volume of blood lost was minimal. The point of impact was the left elbow. The pain is at a severity of 9/10. The pain is severe. She was ambulatory at the scene. There was no entrapment after the fall. There was no drug use involved in the accident. There was no alcohol use involved in the accident. Associated symptoms include nausea. Pertinent negatives include no visual change, no fever, no numbness, no abdominal pain, no vomiting, no hematuria, no headaches, no hearing loss and no loss of consciousness. The symptoms are aggravated by activity. She has tried nothing for the symptoms. The treatment provided no relief.  Loss of Consciousness This is a new problem. The current episode started less than 1 hour ago. The problem occurs rarely. The problem has been resolved. Pertinent negatives include no chest pain, no abdominal pain, no headaches and no shortness of breath. The symptoms are aggravated by nothing. The symptoms are relieved by nothing. She has tried  nothing for the symptoms. The treatment provided significant relief.    No past medical history on file.  No past surgical history on file.  No family history on file.  History  Substance Use Topics  . Smoking status: Not on file  . Smokeless tobacco: Not on file  . Alcohol Use: Not on file    OB History    No data available      Review of Systems  Constitutional: Negative.  Negative for fever.  HENT: Negative.   Eyes: Negative.   Respiratory: Negative for shortness of breath.   Cardiovascular: Positive for syncope. Negative for chest pain.  Gastrointestinal: Positive for nausea and diarrhea. Negative for vomiting and abdominal pain.  Genitourinary: Negative.  Negative for hematuria.  Musculoskeletal: Negative.   Skin: Negative.   Neurological: Negative.  Negative for loss of consciousness, numbness and headaches.  Hematological: Negative.   Psychiatric/Behavioral: Negative.   All other systems reviewed and are negative.    Allergies  Review of patient's allergies indicates no known allergies.  Home Medications   Current Outpatient Rx  Name Route Sig Dispense Refill  . LOSARTAN POTASSIUM 100 MG PO TABS Oral Take 100 mg by mouth daily.    Marland Kitchen METFORMIN HCL 1000 MG PO TABS Oral Take 1,000 mg by mouth 2 (two) times daily with a meal.      BP 155/58  Pulse 102  Temp(Src) 98.2 F (36.8 C) (Oral)  Resp 16  SpO2 99%  Physical Exam  Constitutional: She is oriented to  person, place, and time. She appears well-developed and well-nourished. No distress.  HENT:  Head: Normocephalic and atraumatic.  Right Ear: No hemotympanum.  Left Ear: No hemotympanum.  Mouth/Throat: Oropharynx is clear and moist.  Eyes: Conjunctivae are normal. Pupils are equal, round, and reactive to light.  Neck: Normal range of motion. Neck supple.  Cardiovascular: Normal rate and regular rhythm.   Pulmonary/Chest: Effort normal and breath sounds normal. She has no wheezes. She has no rales.    Abdominal: Soft. Bowel sounds are normal. There is tenderness. There is guarding. There is no rebound.  Musculoskeletal: She exhibits tenderness.       Arms:      Olecranon.  No snuff box tenderness of the left wrist left hand neurovascularly intact  Neurological: She is alert and oriented to person, place, and time. She has normal reflexes.  Skin: Skin is warm and dry.  Psychiatric: She has a normal mood and affect.    ED Course  Procedures (including critical care time)  Labs Reviewed  CBC - Abnormal; Notable for the following:    WBC 10.6 (*)    All other components within normal limits  DIFFERENTIAL - Abnormal; Notable for the following:    Neutro Abs 8.0 (*)    Monocytes Absolute 1.1 (*)    All other components within normal limits  URINE CULTURE  URINALYSIS, ROUTINE W REFLEX MICROSCOPIC   Dg Chest 2 View  03/05/2011  *RADIOLOGY REPORT*  Clinical Data: Fall and fainted.  CHEST - 2 VIEW  Comparison: None.  Findings: Two views of the chest demonstrate clear lungs. Heart and mediastinum are within normal limits.  The trachea is midline. Bony structures are intact.  IMPRESSION: No acute chest findings.  Original Report Authenticated By: Markus Daft, M.D.   Dg Elbow Complete Left  03/05/2011  *RADIOLOGY REPORT*  Clinical Data: Fall and laceration to the left elbow.  LEFT ELBOW - COMPLETE 3+ VIEW  Comparison: None.  Findings: Four views of the left elbow were obtained.  There is lucency posterior to the elbow which is likely within the subcutaneous tissues.  Difficult to evaluate for joint effusion. There may be a skin laceration near the olecranon.  There is no evidence for a displaced elbow fracture.  The elbow is located.  IMPRESSION: No evidence for a gross or displaced elbow fracture.  Limited evaluation for a joint effusion due to the laceration and subcutaneous gas.  Original Report Authenticated By: Markus Daft, M.D.   Ct Head Wo Contrast  03/05/2011  *RADIOLOGY REPORT*  Clinical  Data:  Laceration and fall.  CT HEAD WITHOUT CONTRAST CT CERVICAL SPINE WITHOUT CONTRAST  Technique:  Multidetector CT imaging of the head and cervical spine was performed following the standard protocol without intravenous contrast.  Multiplanar CT image reconstructions of the cervical spine were also generated.  Comparison:   None  CT HEAD  Findings: No evidence for acute hemorrhage, mass lesion, midline shift, hydrocephalus or large infarct.  No acute bony abnormality. The visualized sinuses are clear.  IMPRESSION: No acute intracranial abnormality.  CT CERVICAL SPINE  Findings: Negative for acute fracture or dislocation.  Lung apices are clear.  There is a heterogeneous nodule along the inferior thyroid that measures 3.5 x 2.4 cm.  Mild cervical spondylosis in the lower cervical spine.  Normal alignment of the cervical spine.  IMPRESSION: No acute bony abnormality in the cervical spine.  Large inferior thyroid nodule measuring up to 3.5 cm.  Recommend ultrasound evaluation and possible  biopsy of this lesion.  Original Report Authenticated By: Markus Daft, M.D.   Ct Cervical Spine Wo Contrast  03/05/2011  *RADIOLOGY REPORT*  Clinical Data:  Laceration and fall.  CT HEAD WITHOUT CONTRAST CT CERVICAL SPINE WITHOUT CONTRAST  Technique:  Multidetector CT imaging of the head and cervical spine was performed following the standard protocol without intravenous contrast.  Multiplanar CT image reconstructions of the cervical spine were also generated.  Comparison:   None  CT HEAD  Findings: No evidence for acute hemorrhage, mass lesion, midline shift, hydrocephalus or large infarct.  No acute bony abnormality. The visualized sinuses are clear.  IMPRESSION: No acute intracranial abnormality.  CT CERVICAL SPINE  Findings: Negative for acute fracture or dislocation.  Lung apices are clear.  There is a heterogeneous nodule along the inferior thyroid that measures 3.5 x 2.4 cm.  Mild cervical spondylosis in the lower cervical  spine.  Normal alignment of the cervical spine.  IMPRESSION: No acute bony abnormality in the cervical spine.  Large inferior thyroid nodule measuring up to 3.5 cm.  Recommend ultrasound evaluation and possible biopsy of this lesion.  Original Report Authenticated By: Markus Daft, M.D.     1. Syncope   2. Lacerations of multiple sites of left arm       MDM   Date: 03/05/2011  Rate: 94  Rhythm: normal sinus rhythm  QRS Axis: normal  Intervals: PR prolonged  ST/T Wave abnormalities: normal  Conduction Disutrbances:first-degree A-V block   Narrative Interpretation:   Old EKG Reviewed: none  LACERATION REPAIR Performed by: Carlisle Beers Authorized by: Carlisle Beers Consent: Verbal consent obtained. Risks and benefits: risks, benefits and alternatives were discussed Consent given by: patient Patient identity confirmed: provided demographic data Prepped and Draped in normal sterile fashion Wound explored  Laceration Location: olecranon left  Laceration Length: 11-12cm  No Foreign Bodies seen or palpated  Anesthesia: local infiltration  Local anesthetic: lidocaine 1%   Anesthetic total: 72ml  Irrigation method: syringe Amount of cleaning: extensive   Skin closure: staples  Number of sutures: 6 to loosely approximate until OR in am  Technique: staples loose  Patient tolerance: Patient tolerated the procedure well with no immediate complications.  LACERATION REPAIR Performed by: Carlisle Beers Authorized by: Carlisle Beers Consent: Verbal consent obtained. Risks and benefits: risks, benefits and alternatives were discussed Consent given by: patient Patient identity confirmed: provided demographic data Prepped and Draped in normal sterile fashion Wound explored  Laceration Location: posterior left arm just proximal to the olecranon  Laceration Length: 1.5 cm  No Foreign Bodies seen or palpated  Anesthesia: local  infiltration  Local anesthetic: lidocaine 1%   Anesthetic total: 2 ml  Irrigation method: syringe Amount of cleaning: extensive  Skin closure: 4.0 ethilon  Number of sutures: 4  Technique: simple  Patient tolerance: Patient tolerated the procedure well with no immediate complications.  LACERATION REPAIR Performed by: Carlisle Beers Authorized by: Carlisle Beers Consent: Verbal consent obtained. Risks and benefits: risks, benefits and alternatives were discussed Consent given by: patient Patient identity confirmed: provided demographic data Prepped and Draped in normal sterile fashion Wound explored  Laceration Location: just proximal to the left olecranon  Laceration Length: 1.5cm  No Foreign Bodies seen or palpated  Anesthesia: local infiltration  Local anesthetic: lidocaine 1%   Anesthetic total: 2 ml  Irrigation method: syringe Amount of cleaning: extensive  Skin closure: 4.0 ethilon  Number of sutures: 3  Technique:  simple  Patient tolerance: Patient tolerated the procedure  well with no immediate complications. LACERATION REPAIR Performed by: Carlisle Beers Authorized by: Carlisle Beers Consent: Verbal consent obtained. Risks and benefits: risks, benefits and alternatives were discussed Consent given by: patient Patient identity confirmed: provided demographic data Prepped and Draped in normal sterile fashion Wound explored  Laceration Location: just distal to the left olecranon  Laceration Length: 1.5 cm  No Foreign Bodies seen or palpated  Anesthesia: local infiltration  Local anesthetic: lidocaine 1%   Anesthetic total: 33ml  Irrigation method: syringe Amount of cleaning: extensive  Skin closure: 4.0 ethilon  Number of sutures: 3  Technique:  simple  Patient tolerance: Patient tolerated the procedure well with no immediate complications.  LACERATION REPAIR Performed by: Carlisle Beers Authorized by: Carlisle Beers Consent: Verbal consent obtained. Risks and benefits: risks, benefits and alternatives were discussed Consent given by: patient Patient identity confirmed: provided demographic data Prepped and Draped in normal sterile fashion Wound explored  Laceration Location: distal and medial to the left olecranon gouge  Laceration Length: 2cm  No Foreign Bodies seen or palpated  Anesthesia: local infiltration  Local anesthetic: lidocaine 1%   Anesthetic total: 2 ml  Irrigation method: syringe Amount of cleaning: extensive  Skin closure: 4.0 ethilon  Number of sutures: 2  Technique: simple  Patient tolerance: Patient tolerated the procedure well with no immediate complications.      Carlisle Beers, MD 03/05/11 [phone]

## 2011-03-05 NOTE — H&P (Signed)
Michelle Cummings is an 61 y.o. female.   PCP - Gabriel Carina. Chief Complaint: Loss of consciousness and left elbow injury. HPI: 61 year old female with known history of hypertension and diabetes mellitus type 2 presented to the ER the patient had a fall last night while going to the bathroom. Patient states since yesterday morning patient has been having multiple episodes of diarrhea. Last night when she tried to go to the bathroom she fell and lost consciousness. Her husband was there at the home called EMS and patient was brought to the ER. When she fell her left elbow hit the vase and she had a wound. In the ER patient had CT of the head neck chest x-rays EKG and basic labs which all does not show any acute except for a thyroid nodule. X-ray of the left elbow it's not very conclusive. At this time orthopedic surgeon on call Dr.Ortman has been consulted by ER physician Dr.Palumbo. Dr.Ortman is likely to take the patient to surgery. Patient denies any chest pain palpitation nausea vomiting abdominal pain or any blood in the diarrhea. Patient denies any focal deficits headache or visual symptoms. Patient has not had a syncopal episode previously.  Past Medical History  Diagnosis Date  . Diabetes mellitus   . Hypertension     Past Surgical History  Procedure Date  . No past surgeries     History reviewed. No pertinent family history. Social History:  reports that she has never smoked. She does not have any smokeless tobacco history on file. She reports that she does not drink alcohol. Her drug history not on file.  Allergies: No Known Allergies  Medications Prior to Admission  Medication Dose Route Frequency Provider Last Rate Last Dose  . ceFAZolin (ANCEF) IVPB 1 g/50 mL premix  1 g Intravenous Once April K Palumbo-Rasch, MD   1 g at 03/05/11 0132  . fentaNYL (SUBLIMAZE) injection 100 mcg  100 mcg Intravenous Once April K Palumbo-Rasch, MD   100 mcg at 03/05/11 0410  . fentaNYL (SUBLIMAZE)  injection 50 mcg  50 mcg Intravenous Once April K Palumbo-Rasch, MD   50 mcg at 03/05/11 0328  . lidocaine-EPINEPHrine (XYLOCAINE W/EPI) 1 %-1:100000 (with pres) injection           . sodium chloride 0.9 % bolus 1,000 mL  1,000 mL Intravenous Once April K Palumbo-Rasch, MD   1,000 mL at 03/05/11 0325  . TDaP (BOOSTRIX) injection 0.5 mL  0.5 mL Intramuscular Once April K Palumbo-Rasch, MD   0.5 mL at 03/05/11 0132  . DISCONTD: lidocaine (XYLOCAINE) 4 % external solution            No current outpatient prescriptions on file as of 03/05/2011.    Results for orders placed during the hospital encounter of 03/05/11 (from the past 48 hour(s))  CBC     Status: Abnormal   Collection Time   03/05/11 12:25 AM      Component Value Range Comment   WBC 10.6 (*) 4.0 - 10.5 (K/uL)    RBC 4.54  3.87 - 5.11 (MIL/uL)    Hemoglobin 12.1  12.0 - 15.0 (g/dL)    HCT 37.2  36.0 - 46.0 (%)    MCV 81.9  78.0 - 100.0 (fL)    MCH 26.7  26.0 - 34.0 (pg)    MCHC 32.5  30.0 - 36.0 (g/dL)    RDW 13.9  11.5 - 15.5 (%)    Platelets 367  150 - 400 (K/uL)  DIFFERENTIAL     Status: Abnormal   Collection Time   03/05/11 12:25 AM      Component Value Range Comment   Neutrophils Relative 75  43 - 77 (%)    Neutro Abs 8.0 (*) 1.7 - 7.7 (K/uL)    Lymphocytes Relative 14  12 - 46 (%)    Lymphs Abs 1.4  0.7 - 4.0 (K/uL)    Monocytes Relative 10  3 - 12 (%)    Monocytes Absolute 1.1 (*) 0.1 - 1.0 (K/uL)    Eosinophils Relative 1  0 - 5 (%)    Eosinophils Absolute 0.2  0.0 - 0.7 (K/uL)    Basophils Relative 0  0 - 1 (%)    Basophils Absolute 0.0  0.0 - 0.1 (K/uL)    Dg Chest 2 View  03/05/2011  *RADIOLOGY REPORT*  Clinical Data: Fall and fainted.  CHEST - 2 VIEW  Comparison: None.  Findings: Two views of the chest demonstrate clear lungs. Heart and mediastinum are within normal limits.  The trachea is midline. Bony structures are intact.  IMPRESSION: No acute chest findings.  Original Report Authenticated By: Markus Daft,  M.D.   Dg Elbow Complete Left  03/05/2011  *RADIOLOGY REPORT*  Clinical Data: Fall and laceration to the left elbow.  LEFT ELBOW - COMPLETE 3+ VIEW  Comparison: None.  Findings: Four views of the left elbow were obtained.  There is lucency posterior to the elbow which is likely within the subcutaneous tissues.  Difficult to evaluate for joint effusion. There may be a skin laceration near the olecranon.  There is no evidence for a displaced elbow fracture.  The elbow is located.  IMPRESSION: No evidence for a gross or displaced elbow fracture.  Limited evaluation for a joint effusion due to the laceration and subcutaneous gas.  Original Report Authenticated By: Markus Daft, M.D.   Ct Head Wo Contrast  03/05/2011  *RADIOLOGY REPORT*  Clinical Data:  Laceration and fall.  CT HEAD WITHOUT CONTRAST CT CERVICAL SPINE WITHOUT CONTRAST  Technique:  Multidetector CT imaging of the head and cervical spine was performed following the standard protocol without intravenous contrast.  Multiplanar CT image reconstructions of the cervical spine were also generated.  Comparison:   None  CT HEAD  Findings: No evidence for acute hemorrhage, mass lesion, midline shift, hydrocephalus or large infarct.  No acute bony abnormality. The visualized sinuses are clear.  IMPRESSION: No acute intracranial abnormality.  CT CERVICAL SPINE  Findings: Negative for acute fracture or dislocation.  Lung apices are clear.  There is a heterogeneous nodule along the inferior thyroid that measures 3.5 x 2.4 cm.  Mild cervical spondylosis in the lower cervical spine.  Normal alignment of the cervical spine.  IMPRESSION: No acute bony abnormality in the cervical spine.  Large inferior thyroid nodule measuring up to 3.5 cm.  Recommend ultrasound evaluation and possible biopsy of this lesion.  Original Report Authenticated By: Markus Daft, M.D.   Ct Cervical Spine Wo Contrast  03/05/2011  *RADIOLOGY REPORT*  Clinical Data:  Laceration and fall.  CT HEAD  WITHOUT CONTRAST CT CERVICAL SPINE WITHOUT CONTRAST  Technique:  Multidetector CT imaging of the head and cervical spine was performed following the standard protocol without intravenous contrast.  Multiplanar CT image reconstructions of the cervical spine were also generated.  Comparison:   None  CT HEAD  Findings: No evidence for acute hemorrhage, mass lesion, midline shift, hydrocephalus or large infarct.  No acute bony abnormality. The  visualized sinuses are clear.  IMPRESSION: No acute intracranial abnormality.  CT CERVICAL SPINE  Findings: Negative for acute fracture or dislocation.  Lung apices are clear.  There is a heterogeneous nodule along the inferior thyroid that measures 3.5 x 2.4 cm.  Mild cervical spondylosis in the lower cervical spine.  Normal alignment of the cervical spine.  IMPRESSION: No acute bony abnormality in the cervical spine.  Large inferior thyroid nodule measuring up to 3.5 cm.  Recommend ultrasound evaluation and possible biopsy of this lesion.  Original Report Authenticated By: Markus Daft, M.D.    Review of Systems  Constitutional: Negative.   HENT: Negative.   Eyes: Negative.   Respiratory: Negative.   Cardiovascular: Negative.   Gastrointestinal: Negative.   Genitourinary: Negative.   Musculoskeletal: Positive for falls.  Skin: Negative.   Neurological: Positive for loss of consciousness.  Endo/Heme/Allergies: Negative.     Blood pressure 148/69, pulse 96, temperature 98.2 F (36.8 C), temperature source Oral, resp. rate 24, SpO2 98.00%. Physical Exam  Constitutional: She is oriented to person, place, and time. She appears well-developed and well-nourished. No distress.  HENT:  Head: Normocephalic and atraumatic.  Right Ear: External ear normal.  Left Ear: External ear normal.  Nose: Nose normal.  Mouth/Throat: Oropharynx is clear and moist. No oropharyngeal exudate.  Eyes: Conjunctivae are normal. Pupils are equal, round, and reactive to light. Right eye  exhibits no discharge. Left eye exhibits no discharge. No scleral icterus.  Neck: Normal range of motion. Neck supple.  Cardiovascular: Normal rate, regular rhythm and normal heart sounds.   Respiratory: Effort normal and breath sounds normal. No respiratory distress. She has no wheezes. She has no rales.  GI: Soft. Bowel sounds are normal. She exhibits no distension. There is no tenderness. There is no rebound.  Musculoskeletal: Normal range of motion.       Left arm is dressed. Patient has good pulses.  Neurological: She is alert and oriented to person, place, and time. No cranial nerve deficit. Coordination normal.       Moves upper and lower extremities 5/5. No facial asymmetry.  Skin: Skin is warm and dry. No rash noted. She is not diaphoretic. No erythema.  Psychiatric: Her behavior is normal.     Assessment/Plan #1. Syncope probably precipitated by dehydration from diarrhea - at this time we will monitor patient in telemetry to rule out arrhythmias. We will aggressively hydrate patient with IV fluids. And recheck orthostatic blood pressures. #2. Left elbow injury - patient sustained a left elbow injury after the fall and patient is likely to go for surgery. Dr. Caralyn Guile has been consulted. Patient will be kept n.p.o. until then. #3. Diarrhea - this may be viral. Patient has not taken any antibiotics recently. We will check stool cultures and as suggested earlier aggressively hydrate. #4. Large thyroid nodule - I have discussed this with the patient. I have explained the patient will need further workup as outpatient for this and it is very important. For now we will check a TSH and free T4 level and make sure there is no hyperthyroidism. #5. History of hypertension - as patient is going to be n.p.o. I will keep patient on when necessary labetalol for systolic blood pressure more than 160. Her regular home medication can be resumed once patient starts taking by mouth. #6. History of diabetes  mellitus type 2 - as patient will be n.p.o. for possible surgery we will check CBG Q4 which can be changed to a.c. and at  bedtime once patient starts diet. Patient will be on sliding scale coverage.  CODE STATUS - full code.  Rise Patience. 03/05/2011, 6:15 AM

## 2011-03-05 NOTE — Progress Notes (Signed)
Utilization Review completed.  

## 2011-03-05 NOTE — Anesthesia Procedure Notes (Signed)
Procedure Name: LMA Insertion Date/Time: 03/05/2011 2:55 PM Performed by: Valetta Fuller Pre-anesthesia Checklist: Patient identified, Emergency Drugs available, Suction available and Patient being monitored Patient Re-evaluated:Patient Re-evaluated prior to inductionOxygen Delivery Method: Circle system utilized Preoxygenation: Pre-oxygenation with 100% oxygen Intubation Type: IV induction Ventilation: Mask ventilation without difficulty LMA: LMA inserted LMA Size: 4.0 Tube secured with: Tape Dental Injury: Teeth and Oropharynx as per pre-operative assessment

## 2011-03-05 NOTE — Preoperative (Signed)
Beta Blockers   Reason not to administer Beta Blockers:Not Applicable 

## 2011-03-05 NOTE — Progress Notes (Signed)
Patient seen and examined, admitted by Dr. Hal Hope this morning. Briefly, patient admitted with syncopal episode likely secondary to dehydration and diarrhea, had a fall and sustained left elbow injury. - Continue IV fluids and syncope workup - Plan for OR today for the left elbow injury - Will follow closely.   Montrey Buist M.D. [facility] 03/05/2011, 3:28 PM  Pager: [phone]

## 2011-03-05 NOTE — ED Notes (Signed)
Per EMS, pt has been having diarrhea. Pt was trying to go to the bathroom when she fell like she was going to "pass out". That is when she fell and LOC. She hit her left elbow on a glass vase. Pt is not sure if she hit her head.  Vitals: 172/86, CBG 198, HR 110 and NSR

## 2011-03-05 NOTE — Progress Notes (Signed)
POST OP PLAN: OK TO GO HOME KEEP SPLINT ON AT ALL TIMES NO USE OF LEFT ARM LEFT ARM IS TO KEEP STRAIGHT AT ALL TIMES CALL OFFICE FOR F/U IN 12 DAYS [phone]

## 2011-03-06 LAB — CBC
HCT: 30.5 % — ABNORMAL LOW (ref 36.0–46.0)
Hemoglobin: 9.6 g/dL — ABNORMAL LOW (ref 12.0–15.0)
MCH: 26.4 pg (ref 26.0–34.0)
MCHC: 31.5 g/dL (ref 30.0–36.0)
MCV: 84 fL (ref 78.0–100.0)
Platelets: 273 10*3/uL (ref 150–400)
RBC: 3.63 MIL/uL — ABNORMAL LOW (ref 3.87–5.11)
RDW: 14.3 % (ref 11.5–15.5)
WBC: 7.1 10*3/uL (ref 4.0–10.5)

## 2011-03-06 LAB — GLUCOSE, CAPILLARY
Glucose-Capillary: 143 mg/dL — ABNORMAL HIGH (ref 70–99)
Glucose-Capillary: 225 mg/dL — ABNORMAL HIGH (ref 70–99)
Glucose-Capillary: 100 mg/dL — ABNORMAL HIGH (ref 70–99)
Glucose-Capillary: 198 mg/dL — ABNORMAL HIGH (ref 70–99)

## 2011-03-06 LAB — BASIC METABOLIC PANEL
BUN: 16 mg/dL (ref 6–23)
CO2: 19 mEq/L (ref 19–32)
Calcium: 8.4 mg/dL (ref 8.4–10.5)
Chloride: 111 mEq/L (ref 96–112)
Creatinine, Ser: 0.8 mg/dL (ref 0.50–1.10)
GFR calc Af Amer: 90 mL/min — ABNORMAL LOW (ref >90)
GFR calc non Af Amer: 77 mL/min — ABNORMAL LOW (ref >90)
Glucose, Bld: 159 mg/dL — ABNORMAL HIGH (ref 70–99)
Potassium: 3.9 mEq/L (ref 3.5–5.1)
Sodium: 137 mEq/L (ref 135–145)

## 2011-03-06 MED ORDER — LOSARTAN POTASSIUM 50 MG PO TABS
100.0000 mg | ORAL_TABLET | Freq: Every day | ORAL | Status: DC
  Administered 2011-03-06 – 2011-03-07 (×2): 100 mg via ORAL
  Filled 2011-03-06 (×3): qty 2

## 2011-03-06 MED ORDER — METFORMIN HCL 500 MG PO TABS
1000.0000 mg | ORAL_TABLET | Freq: Two times a day (BID) | ORAL | Status: DC
  Administered 2011-03-07: 1000 mg via ORAL
  Filled 2011-03-06 (×4): qty 2

## 2011-03-06 NOTE — Progress Notes (Signed)
Patient ID: Michelle Cummings    [id number]    DOB: 1950-03-26    DOA: 03/05/2011  PCP: [name], MD, MD  Subjective: Pain controlled  Objective: Weight change:   Intake/Output Summary (Last 24 hours) at 03/06/11 1718 Last data filed at 03/06/11 0800  Gross per 24 hour  Intake    300 ml  Output      0 ml  Net    300 ml   Blood pressure 176/91, pulse 107, temperature 98.5 F (36.9 C), temperature source Oral, resp. rate 20, weight 84.641 kg (186 lb 9.6 oz), SpO2 98.00%.  Physical Exam: General: Alert and awake, oriented x3, not in any acute distress. HEENT: anicteric sclera, pupils reactive to light and accommodation, EOMI CVS: S1-S2 clear, no murmur rubs or gallops Chest: clear to auscultation bilaterally, no wheezing, rales or rhonchi Abdomen: soft nontender, nondistended, normal bowel sounds, no organomegaly Extremities: no cyanosis, clubbing or edema noted bilaterally, left elbow in cast  Neuro: Cranial nerves II-XII intact, no focal neurological deficits  Lab Results: Basic Metabolic Panel:  Lab [id number] 03/05/11 0835  NA 137 136  K 3.9 4.2  CL 111 108  CO2 19 16*  GLUCOSE 159* 179*  BUN 16 26*  CREATININE 0.80 0.84  CALCIUM 8.4 9.3  MG -- --  PHOS -- --   Liver Function Tests:  Lab 03/05/11 0835  AST 15  ALT 32  ALKPHOS 88  BILITOT 0.2*  PROT 7.0  ALBUMIN 3.3*   CBC:  Lab 03/06/11 0645 03/05/11 0835 03/05/11 0025  WBC 7.1 7.2 --  NEUTROABS -- -- 8.0*  HGB 9.6* 10.7* --  HCT 30.5* 34.5* --  MCV 84.0 82.3 --  PLT 273 315 --   CBG:  Lab 03/06/11 1646 03/06/11 1215 03/06/11 0758 03/05/11 2115 03/05/11 1634  GLUCAP 100* 225* 143* 156* 150*     Micro Results: No results found for this or any previous visit (from the past 240 hour(s)).  Studies/Results: Dg Chest 2 View  03/05/2011  *RADIOLOGY REPORT*  Clinical Data: Fall and fainted.  CHEST - 2 VIEW  Comparison: None.  Findings: Two views of the chest demonstrate clear lungs. Heart and  mediastinum are within normal limits.  The trachea is midline. Bony structures are intact.  IMPRESSION: No acute chest findings.  Original Report Authenticated By: Markus Daft, M.D.   Dg Elbow Complete Left  03/05/2011  *RADIOLOGY REPORT*  Clinical Data: Fall and laceration to the left elbow.  LEFT ELBOW - COMPLETE 3+ VIEW  Comparison: None.  Findings: Four views of the left elbow were obtained.  There is lucency posterior to the elbow which is likely within the subcutaneous tissues.  Difficult to evaluate for joint effusion. There may be a skin laceration near the olecranon.  There is no evidence for a displaced elbow fracture.  The elbow is located.  IMPRESSION: No evidence for a gross or displaced elbow fracture.  Limited evaluation for a joint effusion due to the laceration and subcutaneous gas.  Original Report Authenticated By: Markus Daft, M.D.   Ct Head Wo Contrast  03/05/2011  *RADIOLOGY REPORT*  Clinical Data:  Laceration and fall.  CT HEAD WITHOUT CONTRAST CT CERVICAL SPINE WITHOUT CONTRAST  Technique:  Multidetector CT imaging of the head and cervical spine was performed following the standard protocol without intravenous contrast.  Multiplanar CT image reconstructions of the cervical spine were also generated.  Comparison:   None  CT HEAD  Findings: No evidence for acute  hemorrhage, mass lesion, midline shift, hydrocephalus or large infarct.  No acute bony abnormality. The visualized sinuses are clear.  IMPRESSION: No acute intracranial abnormality.  CT CERVICAL SPINE  Findings: Negative for acute fracture or dislocation.  Lung apices are clear.  There is a heterogeneous nodule along the inferior thyroid that measures 3.5 x 2.4 cm.  Mild cervical spondylosis in the lower cervical spine.  Normal alignment of the cervical spine.  IMPRESSION: No acute bony abnormality in the cervical spine.  Large inferior thyroid nodule measuring up to 3.5 cm.  Recommend ultrasound evaluation and possible biopsy of this  lesion.  Original Report Authenticated By: Markus Daft, M.D.   Ct Cervical Spine Wo Contrast  03/05/2011  *RADIOLOGY REPORT*  Clinical Data:  Laceration and fall.  CT HEAD WITHOUT CONTRAST CT CERVICAL SPINE WITHOUT CONTRAST  Technique:  Multidetector CT imaging of the head and cervical spine was performed following the standard protocol without intravenous contrast.  Multiplanar CT image reconstructions of the cervical spine were also generated.  Comparison:   None  CT HEAD  Findings: No evidence for acute hemorrhage, mass lesion, midline shift, hydrocephalus or large infarct.  No acute bony abnormality. The visualized sinuses are clear.  IMPRESSION: No acute intracranial abnormality.  CT CERVICAL SPINE  Findings: Negative for acute fracture or dislocation.  Lung apices are clear.  There is a heterogeneous nodule along the inferior thyroid that measures 3.5 x 2.4 cm.  Mild cervical spondylosis in the lower cervical spine.  Normal alignment of the cervical spine.  IMPRESSION: No acute bony abnormality in the cervical spine.  Large inferior thyroid nodule measuring up to 3.5 cm.  Recommend ultrasound evaluation and possible biopsy of this lesion.  Original Report Authenticated By: Markus Daft, M.D.    Medications: Scheduled Meds:   .  ceFAZolin (ANCEF) IV  1 g Intravenous Q8H  . insulin aspart  0-9 Units Subcutaneous TID WC  . losartan  100 mg Oral Daily  . metFORMIN  1,000 mg Oral BID WC  . sodium chloride  3 mL Intravenous Q12H  . DISCONTD:  ceFAZolin (ANCEF) IV  1 g Intravenous NOW   Continuous Infusions:   . sodium chloride 100 mL/hr at 03/06/11 1055     Assessment/Plan: Principal Problem:  *Syncope: Vasovagal, Likely scheduled to dehydration - Continue gentle hydration today, nausea vomiting diarrhea resolved - PT evaluation ordered  Active Problems:  Diarrhea: Resolved per patient   Open wound of left elbow:  Status post surgery yesterday - Continue pain control, recommendations per  Dr. Caralyn Guile   HTN (hypertension): Uncontrolled - Restarted Cozaar, continue labetalol when necessary   Thyroid nodule: TSH 0.79 - check T4, T3, patient to followup with her PCP for further workup, will need a thyroid ultrasound and biopsy and a referral to endocrinologist.   Diabetes mellitus: Uncontrolled - Added metformin, continue sliding scale insulin  DVT Prophylaxis: SCDs  Code Status: Full Code  Disposition: Hopefully tomorrow am, await PT rec's   LOS: 1 day   Dabney Schanz M.D. [facility] 03/06/2011, 5:18 PM Pager: [phone]

## 2011-03-06 NOTE — Op Note (Signed)
NAMESALIA, Michelle Cummings NO.:  [id number]  MEDICAL RECORD NO.:  [id number]  LOCATION:  [id number]                         FACILITY:  [facility]  PHYSICIAN:  Michelle Nakayama, MD  DATE OF BIRTH:  05/30/1950  DATE OF PROCEDURE:  03/05/2011 DATE OF DISCHARGE:                              OPERATIVE REPORT   PREOPERATIVE DIAGNOSIS:  Left elbow laceration with tendon involvement, triceps tendon laceration.  POSTOPERATIVE DIAGNOSIS:  Left elbow laceration with tendon involvement, triceps tendon laceration.  ATTENDING PHYSICIAN:  Michelle Hoff IV, MD, who scrubbed and present for the entire procedure.  ASSISTANT SURGEON:  None.  ANESTHESIA:  General via LMA.  SURGICAL PROCEDURES: 1. Left elbow triceps tendon repair, primary tendon repair. 2. Left elbow traumatic laceration repair, 10 cm.  SURGICAL IMPLANTS:  Two [employer]  2.9 mm anchors with 2-0 MaxBraid suture.  SURGICAL INDICATIONS:  Michelle Cummings is a right-hand dominant female who sustained a traumatic fall from a syncopal episode yesterday sustaining a sharp laceration over the posterior aspect of her elbow.  The patient consented for the above procedure.  Risks, benefits, and alternatives were discussed in detail with the patient and signed informed consent was obtained.  Risks include, but not limited to bleeding; infection; damage to nearby nerves, arteries, or tendons; loss of motion of the elbow, wrist, and digits; tendon rupture; need for further surgical intervention.  DESCRIPTION OF PROCEDURE:  The patient was properly identified in the preop holding area and a mark with a permanent marker was made on the left elbow to indicate the correct operative site.  The patient was then brought back to the operating room and placed supine on the anesthesia room table.  General anesthesia was administered.  The patient tolerated this well.  A well-padded tourniquet was then placed on the left brachium and sealed  with 1000 drape.  The left upper extremity was then prepped and draped in normal sterile fashion.  Time-out was called, correct site was identified, and procedure was then begun. Attention was then turned to the left elbow where the traumatic laceration which was 10 cm was then extended and was then opened up.  This revealed a complete laceration at the triceps insertion.  Triceps tendon was then mobilized.  Had a nice bony area for the "landing" for the triceps. This was prepared using small curettes and rongeurs.  Following this, 2 of the anchors were then placed and then with the suture in place running up the tendon and back down the tendon.  The tendon was then tied in a locking Krackow fashion, was then tied down to the bone with 2 of the anchors.  This was reinforced with several running 2-0 MaxBraid suture along the tendon edges, both medially and laterally.  Several also 2-0 Vicryl sutures were then used to support the fascia layer. After triceps tendon repair, the wound was then thoroughly irrigated. The traumatic laceration was then closed using 3-0 Prolene in a running horizontal mattress suture.  A 10 mL of 0.25% Marcaine infiltrated locally.  Adaptic dressing, sterile compressive bandage was then applied.  The patient tolerated the procedure well, was placed in a well-  molded anterior splint keeping the elbow in full extension, extubated, and taken to recovery room in good condition.  POSTOPERATIVE PLAN:  The patient will be admitted back to the Medicine Service and being able to be discharged to home.  Seen back in my office in approximately 12 days for wound check, suture removal, and begin a postoperative triceps tendon repair protocol.  X-rays at the first visit of the elbow keeping the elbow in full extension.     Michelle Nakayama, MD     FWO/MEDQ  D:  03/05/2011  T:  03/06/2011  Job:  [id number]

## 2011-03-06 NOTE — Progress Notes (Signed)
PT IS FROM HOME, AWAITING PT/OT ORDER TO DETERMINE DC NEEDS.  WILL F/U. Chauncy Lean [phone] OR [phone] 03/06/2011

## 2011-03-07 LAB — GLUCOSE, CAPILLARY
Glucose-Capillary: 160 mg/dL — ABNORMAL HIGH (ref 70–99)
Glucose-Capillary: 135 mg/dL — ABNORMAL HIGH (ref 70–99)

## 2011-03-07 MED ORDER — METHOCARBAMOL 500 MG PO TABS: 500.0000 mg | ORAL_TABLET | Freq: Four times a day (QID) | ORAL | Status: AC | PRN

## 2011-03-07 MED ORDER — HYDROCODONE-ACETAMINOPHEN 5-325 MG PO TABS: 1.0000 | ORAL_TABLET | Freq: Four times a day (QID) | ORAL | Status: AC | PRN

## 2011-03-07 NOTE — Evaluation (Signed)
Physical Therapy Evaluation Patient Details Name: Michelle Cummings MRN: [id number] DOB: 1950/03/14 Today's Date: 03/07/2011  Problem List:  Patient Active Problem List  Diagnoses  . Syncope  . Diarrhea  . Open wound of left elbow  . HTN (hypertension)  . Thyroid nodule  . Diabetes mellitus    Past Medical History:  Past Medical History  Diagnosis Date  . Diabetes mellitus   . Hypertension    Past Surgical History:  Past Surgical History  Procedure Date  . No past surgeries     PT Assessment/Plan/Recommendation PT Assessment Clinical Impression Statement: 61 year old female with known history of hypertension and diabetes mellitus type 2 presented to the ER the patient had a fall last night while going to the bathroom. Patient states since yesterday morning patient has been having multiple episodes of diarrhea. Last night when she tried to go to the bathroom she fell and lost consciousness. Her husband was there at the home called EMS and patient was brought to the ER. When she fell her left elbow hit the vase and she had a wound. In the ER patient had CT of the head neck chest x-rays EKG and basic labs which all does not show any acute except for a thyroid nodule.  Pt sustained elbow laceration with triceps tendon involvement. Surgery performed per ortho. Pt presents to PT with good mobility, LUE splinted in extension. Pt with good understanding of restrictions for LUE. Educated on elevating and icing extremity. No difficulties with ambulation and mobility. Educated on safe bed mobiilty not using LUE. Willl have assist from family on d/c home. No further PT needed at this time until pt clear for elbow mobilization per ortho. PT signing off.  PT Recommendation/Assessment: Patent does not need any further PT services (when clear from ortho she may benefit OPPT for elbow mob. ) No Skilled PT: All education completed;Patient is modified independent with all activity/mobility;Patient will have  necessary level of assist by caregiver at discharge PT Recommendation Follow Up Recommendations: No PT follow up Equipment Recommended: None recommended by PT PT Goals     PT Evaluation Precautions/Restrictions  Precautions Precautions: Fall Restrictions LUE Weight Bearing: Non weight bearing Other Position/Activity Restrictions: No use of LUE at all; splinted in extension Prior [place] Lives With: Spouse Receives Help From: Family (sister and brother in law can help out at home) Type of Home: House Home Layout: One level Home Access: Stairs to enter Entrance Stairs-Rails: None Entrance Stairs-Number of Steps: 3 in the front, 2 in the back, no railing either place Bathroom Shower/Tub: Tub/shower unit [employer]: Standard Home Adaptive Equipment: None Prior Function Level of Independence: Independent with basic ADLs;Independent with homemaking with ambulation;Independent with transfers;Independent with gait Driving: Yes Vocation: Full time employment Comments: Programmer, systems Cognition Arousal/Alertness: Awake/alert Overall Cognitive Status: Appears within functional limits for tasks assessed Orientation Level: Oriented X4 Sensation/Coordination Sensation Light Touch: Appears Intact Coordination Gross Motor Movements are Fluid and Coordinated: Yes Fine Motor Movements are Fluid and Coordinated: Yes Extremity Assessment RUE Assessment RUE Assessment: Within Functional Limits LUE Assessment LUE Assessment:  (splinted in elbow ext.; hand moves WFL; some swelling) RLE Assessment RLE Assessment: Within Functional Limits LLE Assessment LLE Assessment: Within Functional Limits Mobility (including Balance) Bed Mobility Bed Mobility: No (ed pt on technique without use of LUE) Transfers Transfers: Yes Sit to Stand: 6: Modified independent (Device/Increase time);Without upper extremity assist;From chair/3-in-1;From toilet Stand to Sit: 6:  Modified independent (Device/Increase time);Without upper extremity assist;To chair/3-in-1;To  toilet Ambulation/Gait Ambulation/Gait: Yes Ambulation/Gait Assistance: 7: Independent Ambulation Distance (Feet): 160 Feet Assistive device: None Gait Pattern: Within Functional Limits Stairs: Yes Stairs Assistance: 4: Min assist Stairs Assistance Details (indicate cue type and reason): minA RUE HHA to steady self on stairs Stair Management Technique: Step to pattern Number of Stairs: 3   Posture/Postural Control Posture/Postural Control: No significant limitations Exercise    End of Session PT - End of Session Equipment Utilized During Treatment: Gait belt Activity Tolerance: Patient tolerated treatment well Patient left: in chair;with call bell in reach Nurse Communication: Mobility status for transfers;Mobility status for ambulation General Behavior During Session: [facility] for tasks performed Cognition: [facility] for tasks performed  [place] 03/07/2011, 1:12 PM

## 2011-03-07 NOTE — Progress Notes (Signed)
Pt dc home with husband, BSC arranged through [facility], pt ambulated down hall nad back, pt steady no dizziness, SOB, pain.

## 2011-03-07 NOTE — Discharge Summary (Signed)
Physician Discharge Summary  Patient ID: GEANA LELLA MRN: [id number] DOB/AGE: [birth year] 61 y.o.  Admit date: 03/05/2011 Discharge date: 03/07/2011  Primary Care Physician:  [name], MD, MD  Discharge Diagnoses:    .Syncope likely secondary to dehydration from diarrhea  .Diarrhea resolved  .Open wound/laceration of left elbow status post surgery,  postop day 2  .HTN (hypertension) .Thyroid nodule .Diabetes mellitus  Consults: Orthopedics, Dr. Iran Planas   Discharge Medications: Medication List  As of 03/07/2011 12:48 PM   TAKE these medications         bimatoprost 0.03 % ophthalmic solution   Commonly known as: LUMIGAN   Place 1 drop into both eyes at bedtime.      HYDROcodone-acetaminophen 5-325 MG per tablet   Commonly known as: NORCO   Take 1 tablet by mouth every 6 (six) hours as needed for pain.      losartan 100 MG tablet   Commonly known as: COZAAR   Take 100 mg by mouth daily.      metFORMIN 1000 MG tablet   Commonly known as: GLUCOPHAGE   Take 1,000 mg by mouth 2 (two) times daily with a meal.      methocarbamol 500 MG tablet   Commonly known as: ROBAXIN   Take 1 tablet (500 mg total) by mouth 4 (four) times daily as needed (pain/spasms).      timolol 0.5 % ophthalmic gel-forming   Commonly known as: TIMOPTIC-XR   Place 1 drop into both eyes daily.             Brief H and P: For complete details please refer to admission H and P, but in brief patient is a 61 year old female with known history of hypertension and diabetes type 2 presented to the emergency room with a fall while going to the bathroom a night before the admission. Patient stated that she was having multiple episodes of diarrhea, and when she tried to go to the bathroom she fell and lost consciousness. EMS was called and patient was brought to the emergency room. When patient fell, her left elbow hit the vase and she had a wound.   Hospital Course:    *Syncope: Likely secondary  to dehydration from diarrhea - Patient was admitted to the telemetry monitored floor, had no arrhythmias or any cardiac symptoms. Patient was placed on IV fluids, at the time of discharge she was am waiting without any difficulty, tolerating diet and diarrhea had spontaneously resolved.   Diarrhea: Likely viral gastroenteritis, resolved spontaneously   Open wound of left elbow/laceration: Postop day 2 at the time of discharge - Orthopedics was consulted, patient had repair of triceps tendon and open laceration on 03/05/2011. Patient will be seen in approximately 12 days for wound check, suture removal and postoperative triceps tendon repair protocol in Dr. Angus Palms office. Patient was given discharge instructions by Dr. Caralyn Guile.    HTN (hypertension): Stable, patient was restarted on Cozaar   Thyroid nodule: Patient was incidentally found to have large inferior thyroid nodule measuring up to 3.5 cm on CT of the cervical spine done as the protocol for the fall. TSH was within normal limits. Patient was explained in great detail to followup with her primary care physician, Dr. Baird Cancer in 2 weeks for thyroid ultrasound and thyroid biopsy with referral to endocrinologist.   Diabetes mellitus: Remained stable   Day of Discharge BP 155/89  Pulse 92  Temp(Src) 98.5 F (36.9 C) (Oral)  Resp 18  Ht 5\' 4"  (  1.626 m)  Wt 84.505 kg (186 lb 4.8 oz)  BMI 31.98 kg/m2  SpO2 96%  Physical Exam: General: Alert and awake oriented x3 not in any acute distress. HEENT: anicteric sclera, pupils reactive to light and accommodation CVS: S1-S2 clear no murmur rubs or gallops Chest: clear to auscultation bilaterally, no wheezing rales or rhonchi Abdomen: soft nontender, nondistended, normal bowel sounds, no organomegaly Extremities: Left arm dressing intact, no cyanosis, clubbing or edema noted bilaterally Neuro: Cranial nerves II-XII intact, no focal neurological deficits   The results of significant  diagnostics from this hospitalization (including imaging, microbiology, ancillary and laboratory) are listed below for reference.    LAB RESULTS: Basic Metabolic Panel:  Lab [id number] 03/05/11 0835  NA 137 136  K 3.9 4.2  CL 111 108  CO2 19 16*  GLUCOSE 159* 179*  BUN 16 26*  CREATININE 0.80 0.84  CALCIUM 8.4 9.3  MG -- --  PHOS -- --   Liver Function Tests:  Lab 03/05/11 0835  AST 15  ALT 32  ALKPHOS 88  BILITOT 0.2*  PROT 7.0  ALBUMIN 3.3*   CBC:  Lab 03/06/11 0645 03/05/11 0835 03/05/11 0025  WBC 7.1 7.2 --  NEUTROABS -- -- 8.0*  HGB 9.6* 10.7* --  HCT 30.5* 34.5* --  MCV 84.0 -- --  PLT 273 315 --   CBG:  Lab 03/07/11 1158 03/07/11 0734  GLUCAP 135* 160*    Significant Diagnostic Studies:  Dg Chest 2 View  03/05/2011  *RADIOLOGY REPORT*  Clinical Data: Fall and fainted.  CHEST - 2 VIEW  Comparison: None.  Findings: Two views of the chest demonstrate clear lungs. Heart and mediastinum are within normal limits.  The trachea is midline. Bony structures are intact.  IMPRESSION: No acute chest findings.  Original Report Authenticated By: Markus Daft, M.D.   Dg Elbow Complete Left  03/05/2011  *RADIOLOGY REPORT*  Clinical Data: Fall and laceration to the left elbow.  LEFT ELBOW - COMPLETE 3+ VIEW  Comparison: None.  Findings: Four views of the left elbow were obtained.  There is lucency posterior to the elbow which is likely within the subcutaneous tissues.  Difficult to evaluate for joint effusion. There may be a skin laceration near the olecranon.  There is no evidence for a displaced elbow fracture.  The elbow is located.  IMPRESSION: No evidence for a gross or displaced elbow fracture.  Limited evaluation for a joint effusion due to the laceration and subcutaneous gas.  Original Report Authenticated By: Markus Daft, M.D.   Ct Head Wo Contrast  03/05/2011  *RADIOLOGY REPORT*  Clinical Data:  Laceration and fall.  CT HEAD WITHOUT CONTRAST CT CERVICAL SPINE WITHOUT  CONTRAST  Technique:  Multidetector CT imaging of the head and cervical spine was performed following the standard protocol without intravenous contrast.  Multiplanar CT image reconstructions of the cervical spine were also generated.  Comparison:   None  CT HEAD  Findings: No evidence for acute hemorrhage, mass lesion, midline shift, hydrocephalus or large infarct.  No acute bony abnormality. The visualized sinuses are clear.  IMPRESSION: No acute intracranial abnormality.  CT CERVICAL SPINE  Findings: Negative for acute fracture or dislocation.  Lung apices are clear.  There is a heterogeneous nodule along the inferior thyroid that measures 3.5 x 2.4 cm.  Mild cervical spondylosis in the lower cervical spine.  Normal alignment of the cervical spine.  IMPRESSION: No acute bony abnormality in the cervical spine.  Large inferior thyroid nodule  measuring up to 3.5 cm.  Recommend ultrasound evaluation and possible biopsy of this lesion.  Original Report Authenticated By: Markus Daft, M.D.   Ct Cervical Spine Wo Contrast  03/05/2011  *RADIOLOGY REPORT*  Clinical Data:  Laceration and fall.  CT HEAD WITHOUT CONTRAST CT CERVICAL SPINE WITHOUT CONTRAST  Technique:  Multidetector CT imaging of the head and cervical spine was performed following the standard protocol without intravenous contrast.  Multiplanar CT image reconstructions of the cervical spine were also generated.  Comparison:   None  CT HEAD  Findings: No evidence for acute hemorrhage, mass lesion, midline shift, hydrocephalus or large infarct.  No acute bony abnormality. The visualized sinuses are clear.  IMPRESSION: No acute intracranial abnormality.  CT CERVICAL SPINE  Findings: Negative for acute fracture or dislocation.  Lung apices are clear.  There is a heterogeneous nodule along the inferior thyroid that measures 3.5 x 2.4 cm.  Mild cervical spondylosis in the lower cervical spine.  Normal alignment of the cervical spine.  IMPRESSION: No acute bony  abnormality in the cervical spine.  Large inferior thyroid nodule measuring up to 3.5 cm.  Recommend ultrasound evaluation and possible biopsy of this lesion.  Original Report Authenticated By: Markus Daft, M.D.     Disposition and Follow-up: Discharge Orders    Future Orders Please Complete By Expires   Diet Carb Modified      Increase activity slowly      Discharge instructions      Comments:   You need thyroid ultrasound and biopsy of the thyroid nodule, referral to an Endocrinologist (by your PCP, Dr. Baird Cancer).       DISPOSITION: Home  DIET: Carb modified diet  ACTIVITY:  As tolerated  TESTS THAT NEED FOLLOW-UP Thyroid ultrasound, biopsy, needs referral to an endocrinologist.  DISCHARGE FOLLOW-UP Follow-up Information    Follow up with Linna Hoff, MD. Schedule an appointment as soon as possible for a visit in 12 days.   Contact information:   [facility] [address] [place] [phone]       Follow up with [name], MD. Schedule an appointment as soon as possible for a visit in 2 weeks. (you need Thyroid ultrasound and Biopsy, referral to endocrinologist)    Contact information:   [address] [place] [phone]          Time spent on Discharge: 45 mins  Signed:  Lua Feng M.D. [facility] 03/07/2011, 12:48 PM

## 2011-03-07 NOTE — Progress Notes (Signed)
   CARE MANAGEMENT NOTE 03/07/2011  Patient:  Michelle Cummings, Michelle Cummings   Account Number:  [id number]  Date Initiated:  03/07/2011  Documentation initiated by:  [facility]  Subjective/Objective Assessment:   near syncope, fall     Action/Plan:   Anticipated DC Date:  03/07/2011   Anticipated DC Plan:  [place]  CM consult      Choice offered to / List presented to:     DME arranged  3-N-1      DME agency  [place].        Status of service:  Completed, signed off Medicare Important Message given?   (If response is "NO", the following Medicare IM given date fields will be blank) Date Medicare IM given:   Date Additional Medicare IM given:    Discharge Disposition:  HOME/SELF CARE  Per UR Regulation:    Comments:  03/07/2011 1230 Spoke to pt. Explained NCM will have 3n1 delivered to her home. Address correct on facesheet. Jonnie Finner RN CCM Case Mgmt phone [phone]

## 2011-03-11 ENCOUNTER — Encounter (HOSPITAL_COMMUNITY): Payer: Self-pay | Admitting: Orthopedic Surgery

## 2011-03-18 ENCOUNTER — Other Ambulatory Visit: Payer: Self-pay | Admitting: Internal Medicine

## 2011-03-18 DIAGNOSIS — E041 Nontoxic single thyroid nodule: Secondary | ICD-10-CM

## 2011-03-20 ENCOUNTER — Other Ambulatory Visit: Payer: BC Managed Care – PPO

## 2011-03-23 ENCOUNTER — Other Ambulatory Visit: Payer: Self-pay | Admitting: Internal Medicine

## 2011-03-23 ENCOUNTER — Ambulatory Visit
Admission: RE | Admit: 2011-03-23 | Discharge: 2011-03-23 | Disposition: A | Discharge status: Home / Self Care | Payer: BC Managed Care – PPO | Source: Ambulatory Visit | Attending: Internal Medicine | Admitting: Internal Medicine

## 2011-03-23 DIAGNOSIS — E041 Nontoxic single thyroid nodule: Secondary | ICD-10-CM

## 2011-03-24 ENCOUNTER — Other Ambulatory Visit: Payer: Self-pay | Admitting: Internal Medicine

## 2011-03-24 DIAGNOSIS — E042 Nontoxic multinodular goiter: Secondary | ICD-10-CM

## 2011-03-25 ENCOUNTER — Other Ambulatory Visit (HOSPITAL_COMMUNITY)
Admission: RE | Admit: 2011-03-25 | Discharge: 2011-03-25 | Disposition: A | Discharge status: Home / Self Care | Payer: BC Managed Care – PPO | Source: Ambulatory Visit | Attending: Interventional Radiology | Admitting: Interventional Radiology

## 2011-03-25 ENCOUNTER — Ambulatory Visit
Admission: RE | Admit: 2011-03-25 | Discharge: 2011-03-25 | Disposition: A | Discharge status: Home / Self Care | Payer: BC Managed Care – PPO | Source: Ambulatory Visit | Attending: Internal Medicine | Admitting: Internal Medicine

## 2011-03-25 DIAGNOSIS — R221 Localized swelling, mass and lump, neck: Secondary | ICD-10-CM | POA: Insufficient documentation

## 2011-03-25 DIAGNOSIS — E041 Nontoxic single thyroid nodule: Secondary | ICD-10-CM

## 2011-03-25 DIAGNOSIS — R220 Localized swelling, mass and lump, head: Secondary | ICD-10-CM | POA: Insufficient documentation

## 2011-03-25 DIAGNOSIS — E042 Nontoxic multinodular goiter: Secondary | ICD-10-CM

## 2011-05-05 ENCOUNTER — Other Ambulatory Visit: Payer: Self-pay | Admitting: Endocrinology

## 2011-05-05 DIAGNOSIS — E049 Nontoxic goiter, unspecified: Secondary | ICD-10-CM

## 2011-05-29 ENCOUNTER — Other Ambulatory Visit: Payer: Self-pay | Admitting: Internal Medicine

## 2011-05-29 DIAGNOSIS — Z1231 Encounter for screening mammogram for malignant neoplasm of breast: Secondary | ICD-10-CM

## 2011-06-17 ENCOUNTER — Ambulatory Visit
Admission: RE | Admit: 2011-06-17 | Discharge: 2011-06-17 | Disposition: A | Discharge status: Home / Self Care | Payer: BC Managed Care – PPO | Source: Ambulatory Visit | Attending: Internal Medicine | Admitting: Internal Medicine

## 2011-06-17 DIAGNOSIS — Z1231 Encounter for screening mammogram for malignant neoplasm of breast: Secondary | ICD-10-CM

## 2011-07-13 ENCOUNTER — Encounter: Payer: Self-pay | Admitting: Obstetrics and Gynecology

## 2011-07-13 ENCOUNTER — Ambulatory Visit (INDEPENDENT_AMBULATORY_CARE_PROVIDER_SITE_OTHER): Payer: BC Managed Care – PPO | Admitting: Obstetrics and Gynecology

## 2011-07-13 VITALS — BP 130/78 | HR 70 | Ht 64.0 in | Wt 177.0 lb

## 2011-07-13 DIAGNOSIS — Z0000 Encounter for general adult medical examination without abnormal findings: Secondary | ICD-10-CM

## 2011-07-13 DIAGNOSIS — Z124 Encounter for screening for malignant neoplasm of cervix: Secondary | ICD-10-CM

## 2011-07-13 NOTE — Progress Notes (Signed)
Last Pap: 11/14/09 WNL: Yes Regular Periods:no Contraception: BTL  Monthly Breast exam:yes Tetanus<26yrs:yes Nl.Bladder Function:yes Daily BMs:yes Healthy Diet:yes Calcium:yes Mammogram:yes Date of Mammogram: 06/2011 per pt Exercise:yes Have often Exercise: walking 4 times per week  Seatbelt: yes Abuse at home: no Stressful work:no Sigmoid-colonoscopy: 2012 per pt Bone Density: Yes 2012 per pt PCP: Dr. Glendale Chard Change in [place]: none Change in [id number] BP 130/78  Pulse 70  Ht 5\' 4"  (1.626 m)  Wt 177 lb (80.287 kg)  BMI 30.38 kg/m2 Physical Examination: Neck - supple, no significant adenopathy Chest - clear to auscultation, no wheezes, rales or rhonchi, symmetric air entry Heart - normal rate, regular rhythm, normal S1, S2, no murmurs, rubs, clicks or gallops Abdomen - soft, nontender, nondistended, no masses or organomegaly Breasts - breasts appear normal, no suspicious masses, no skin or nipple changes or axillary nodes Pelvic - normal external genitalia, vulva, vagina, cervix, uterus and adnexa, atrophic Rectal - normal rectal, no masses Musculoskeletal - no joint tenderness, deformity or swelling Extremities - peripheral pulses normal, no pedal edema, no clubbing or cyanosis Skin - normal coloration and turgor, no rashes, no suspicious skin lesions noted Normal AEX Pt due for mammogram done and normal per pt colonoscopy due no Pap done yes next due in three yrs RT one year Diet and exercise discussed

## 2011-07-14 LAB — PAP IG W/ RFLX HPV ASCU

## 2011-10-26 ENCOUNTER — Emergency Department (INDEPENDENT_AMBULATORY_CARE_PROVIDER_SITE_OTHER): Payer: Worker's Compensation

## 2011-10-26 ENCOUNTER — Encounter (HOSPITAL_COMMUNITY): Payer: Self-pay | Admitting: *Deleted

## 2011-10-26 ENCOUNTER — Emergency Department (INDEPENDENT_AMBULATORY_CARE_PROVIDER_SITE_OTHER)
Admission: EM | Admit: 2011-10-26 | Discharge: 2011-10-26 | Disposition: A | Discharge status: Home / Self Care | Payer: Worker's Compensation | Source: Home / Self Care | Attending: Emergency Medicine | Admitting: Emergency Medicine

## 2011-10-26 ENCOUNTER — Other Ambulatory Visit: Payer: BC Managed Care – PPO

## 2011-10-26 DIAGNOSIS — S82409A Unspecified fracture of shaft of unspecified fibula, initial encounter for closed fracture: Secondary | ICD-10-CM

## 2011-10-26 DIAGNOSIS — S82402A Unspecified fracture of shaft of left fibula, initial encounter for closed fracture: Secondary | ICD-10-CM

## 2011-10-26 DIAGNOSIS — S82843A Displaced bimalleolar fracture of unspecified lower leg, initial encounter for closed fracture: Secondary | ICD-10-CM

## 2011-10-26 MED ORDER — HYDROCODONE-ACETAMINOPHEN 5-325 MG PO TABS: ORAL_TABLET | ORAL | Status: DC

## 2011-10-26 NOTE — ED Provider Notes (Addendum)
Chief Complaint  Patient presents with  . Ankle Pain    History of Present Illness:   Michelle Cummings is a 61 year old female employee of the [employer]. She injured her ankle at 7:30 this morning at [employer]. She was walking across some wet grass and slipped and fell. She did not hear a pop. She was able to walk both on the scene and here. She describes swelling and pain over the lateral malleolus as well as over the medial malleolus. She able to move her ankle well but it feels tight and hurt somewhat. She denies any numbness or tingling.  Review of Systems:  Other than noted above, the patient denies any of the following symptoms: Systemic:  No fevers, chills, sweats, or aches.  No fatigue or tiredness. Musculoskeletal:  No joint pain, arthritis, bursitis, swelling, back pain, or neck pain. Neurological:  No muscular weakness, paresthesias, headache, or trouble with speech or coordination.  No dizziness.  [place]:  Past medical history, family history, social history, meds, and allergies were reviewed.  Physical Exam:   Vital signs:  BP 201/84  Pulse 76  Temp 97.5 F (36.4 C) (Oral)  Resp 20  SpO2 100% Gen:  Alert and oriented times 3.  In no distress. Musculoskeletal: There is marketed swelling over both medial and lateral malleolar line but no bruising or deformity there is not much pain to palpation over either the medial or lateral malleolus. The ankle has a full range of motion with minimal pain. Pulses were full. Sensation was normal. Muscle strength was normal. Otherwise, all joints had a full a ROM with no swelling, bruising or deformity.  No edema, pulses full. Extremities were warm and pink.  Capillary refill was brisk.  Skin:  Clear, warm and dry.  No rash. Neuro:  Alert and oriented times 3.  Muscle strength was normal.  Sensation was intact to light touch.   Radiology:  Dg Ankle Complete Left  10/26/2011  *RADIOLOGY REPORT*  Clinical Data: Twist injury, fall,  unable to bear weight, soft tissue swelling  LEFT ANKLE COMPLETE - 3+ VIEW  Comparison: None  Findings: Osseous mineralization normal. Oblique displaced distal fibular fracture. Slight widening of the anterior ankle joint on the lateral view. Plantar and Achilles insertion calcaneal spurs. Nonfused ossicle at medial malleolus appears corticated and old. Diffuse soft tissue swelling. No definite additional fracture, dislocation or bone destruction. Large calcification at the lateral proximal to mid foot likely an unusually large accessory ossification center. Scattered intertarsal degenerative changes.  IMPRESSION: Displaced oblique distal fibular fracture. Mild widening of the anterior ankle joint on lateral view could reflect occult ligamentous injury. Significant calcaneal spurring and intertarsal degenerative changes.   Original Report Authenticated By: Burnetta Sabin, M.D.      I reviewed the images independently and personally and concur with the radiologist's findings.  Course in Urgent Care Center:   The patient was placed in a short leg splint with both a stirrup support and posterior support and given crutches for ambulation.  Assessment:  The encounter diagnosis was Left fibular fracture.  Plan:   1.  The following meds were prescribed:   New Prescriptions   HYDROCODONE-ACETAMINOPHEN (NORCO/VICODIN) 5-325 MG PER TABLET    1 to 2 tabs every 4 to 6 hours as needed for pain.   2.  The patient was instructed in symptomatic care, including rest and activity, elevation, application of ice and compression.  Appropriate handouts were given. 3.  The patient was told  to return if becoming worse in any way, if no better in 3 or 4 days, and given some red flag symptoms that would indicate earlier return.   4.  The patient was told to follow up with Dr. Netta Cedars later on this week.   Harden Mo, MD 10/26/11 1432  Harden Mo, MD 10/26/11 1434

## 2011-10-26 NOTE — ED Notes (Signed)
Pt  Twisted her  l  Ankle  Today  While  Walking on the  [employer]   She  Has  Pain /  Swelling  To the  Affected  Ankle   -  occ  Health  Has  No   X  Ray  Tech  Today that is  Why she  Is  Here

## 2011-11-04 ENCOUNTER — Encounter (HOSPITAL_BASED_OUTPATIENT_CLINIC_OR_DEPARTMENT_OTHER): Payer: Self-pay | Admitting: *Deleted

## 2011-11-04 NOTE — Progress Notes (Signed)
Will need istat

## 2011-11-05 ENCOUNTER — Ambulatory Visit (HOSPITAL_BASED_OUTPATIENT_CLINIC_OR_DEPARTMENT_OTHER): Payer: Worker's Compensation | Admitting: Anesthesiology

## 2011-11-05 ENCOUNTER — Ambulatory Visit (HOSPITAL_COMMUNITY): Payer: Worker's Compensation

## 2011-11-05 ENCOUNTER — Ambulatory Visit (HOSPITAL_BASED_OUTPATIENT_CLINIC_OR_DEPARTMENT_OTHER)
Admission: RE | Admit: 2011-11-05 | Discharge: 2011-11-05 | Disposition: A | Discharge status: Home / Self Care | Payer: Worker's Compensation | Source: Ambulatory Visit | Attending: Orthopedic Surgery | Admitting: Orthopedic Surgery

## 2011-11-05 ENCOUNTER — Encounter (HOSPITAL_BASED_OUTPATIENT_CLINIC_OR_DEPARTMENT_OTHER)
Admission: RE | Disposition: A | Discharge status: Home / Self Care | Payer: Self-pay | Source: Ambulatory Visit | Attending: Orthopedic Surgery

## 2011-11-05 ENCOUNTER — Encounter (HOSPITAL_BASED_OUTPATIENT_CLINIC_OR_DEPARTMENT_OTHER): Payer: Self-pay | Admitting: *Deleted

## 2011-11-05 ENCOUNTER — Encounter (HOSPITAL_BASED_OUTPATIENT_CLINIC_OR_DEPARTMENT_OTHER): Payer: Self-pay | Admitting: Anesthesiology

## 2011-11-05 DIAGNOSIS — Z7982 Long term (current) use of aspirin: Secondary | ICD-10-CM | POA: Insufficient documentation

## 2011-11-05 DIAGNOSIS — M129 Arthropathy, unspecified: Secondary | ICD-10-CM | POA: Insufficient documentation

## 2011-11-05 DIAGNOSIS — Z79899 Other long term (current) drug therapy: Secondary | ICD-10-CM | POA: Insufficient documentation

## 2011-11-05 DIAGNOSIS — Y990 Civilian activity done for income or pay: Secondary | ICD-10-CM | POA: Insufficient documentation

## 2011-11-05 DIAGNOSIS — S8263XA Displaced fracture of lateral malleolus of unspecified fibula, initial encounter for closed fracture: Secondary | ICD-10-CM | POA: Insufficient documentation

## 2011-11-05 DIAGNOSIS — S8262XA Displaced fracture of lateral malleolus of left fibula, initial encounter for closed fracture: Secondary | ICD-10-CM

## 2011-11-05 DIAGNOSIS — E119 Type 2 diabetes mellitus without complications: Secondary | ICD-10-CM | POA: Insufficient documentation

## 2011-11-05 DIAGNOSIS — I10 Essential (primary) hypertension: Secondary | ICD-10-CM | POA: Insufficient documentation

## 2011-11-05 DIAGNOSIS — X500XXA Overexertion from strenuous movement or load, initial encounter: Secondary | ICD-10-CM | POA: Insufficient documentation

## 2011-11-05 HISTORY — DX: Unspecified osteoarthritis, unspecified site: M19.90

## 2011-11-05 HISTORY — PX: ORIF ANKLE FRACTURE: SHX5408

## 2011-11-05 LAB — POCT I-STAT, CHEM 8
BUN: 29 mg/dL — ABNORMAL HIGH (ref 6–23)
Calcium, Ion: 1.23 mmol/L (ref 1.13–1.30)
Chloride: 109 mEq/L (ref 96–112)
Creatinine, Ser: 1 mg/dL (ref 0.50–1.10)
Glucose, Bld: 145 mg/dL — ABNORMAL HIGH (ref 70–99)
HCT: 36 % (ref 36.0–46.0)
Hemoglobin: 12.2 g/dL (ref 12.0–15.0)
Potassium: 4.3 mEq/L (ref 3.5–5.1)
Sodium: 138 mEq/L (ref 135–145)
TCO2: 21 mmol/L (ref 0–100)

## 2011-11-05 LAB — GLUCOSE, CAPILLARY: Glucose-Capillary: 121 mg/dL — ABNORMAL HIGH (ref 70–99)

## 2011-11-05 SURGERY — OPEN REDUCTION INTERNAL FIXATION (ORIF) ANKLE FRACTURE
Anesthesia: General | Laterality: Left

## 2011-11-05 MED ORDER — ACETAMINOPHEN 10 MG/ML IV SOLN
1000.0000 mg | Freq: Once | INTRAVENOUS | Status: AC
  Administered 2011-11-05: 1000 mg via INTRAVENOUS

## 2011-11-05 MED ORDER — OXYCODONE HCL 5 MG PO TABS: 5.0000 mg | ORAL_TABLET | Freq: Once | ORAL | Status: DC | PRN

## 2011-11-05 MED ORDER — LIDOCAINE HCL (CARDIAC) 20 MG/ML IV SOLN
INTRAVENOUS | Status: DC | PRN
  Administered 2011-11-05: 50 mg via INTRAVENOUS

## 2011-11-05 MED ORDER — DOCUSATE SODIUM 100 MG PO CAPS: 100.0000 mg | ORAL_CAPSULE | Freq: Two times a day (BID) | ORAL | Status: DC

## 2011-11-05 MED ORDER — OXYCODONE HCL 5 MG/5ML PO SOLN: 5.0000 mg | Freq: Once | ORAL | Status: DC | PRN

## 2011-11-05 MED ORDER — LACTATED RINGERS IV SOLN
INTRAVENOUS | Status: DC
  Administered 2011-11-05 (×2): via INTRAVENOUS

## 2011-11-05 MED ORDER — ASPIRIN EC 325 MG PO TBEC: 325.0000 mg | DELAYED_RELEASE_TABLET | Freq: Every day | ORAL | Status: DC

## 2011-11-05 MED ORDER — BUPIVACAINE-EPINEPHRINE PF 0.5-1:200000 % IJ SOLN
INTRAMUSCULAR | Status: DC | PRN
  Administered 2011-11-05: 30 mL

## 2011-11-05 MED ORDER — EPHEDRINE SULFATE 50 MG/ML IJ SOLN
INTRAMUSCULAR | Status: DC | PRN
  Administered 2011-11-05: 10 mg via INTRAVENOUS

## 2011-11-05 MED ORDER — MIDAZOLAM HCL 2 MG/2ML IJ SOLN
0.5000 mg | INTRAMUSCULAR | Status: DC | PRN
  Administered 2011-11-05: 2 mg via INTRAVENOUS

## 2011-11-05 MED ORDER — HYDROMORPHONE HCL PF 1 MG/ML IJ SOLN: 0.2500 mg | INTRAMUSCULAR | Status: DC | PRN

## 2011-11-05 MED ORDER — CEFAZOLIN SODIUM-DEXTROSE 2-3 GM-% IV SOLR
2.0000 g | INTRAVENOUS | Status: AC
  Administered 2011-11-05: 2 g via INTRAVENOUS

## 2011-11-05 MED ORDER — PROPOFOL 10 MG/ML IV BOLUS
INTRAVENOUS | Status: DC | PRN
  Administered 2011-11-05: 200 mg via INTRAVENOUS

## 2011-11-05 MED ORDER — ONDANSETRON HCL 4 MG/2ML IJ SOLN
INTRAMUSCULAR | Status: DC | PRN
  Administered 2011-11-05: 4 mg via INTRAVENOUS

## 2011-11-05 MED ORDER — BUPIVACAINE HCL (PF) 0.5 % IJ SOLN
INTRAMUSCULAR | Status: DC | PRN
Start: 2011-11-05 — End: 2011-11-05
  Administered 2011-11-05: 10 mL

## 2011-11-05 MED ORDER — FENTANYL CITRATE 0.05 MG/ML IJ SOLN
50.0000 ug | INTRAMUSCULAR | Status: DC | PRN
  Administered 2011-11-05: 100 ug via INTRAVENOUS

## 2011-11-05 MED ORDER — OXYCODONE HCL 5 MG PO TABS: 5.0000 mg | ORAL_TABLET | ORAL | Status: DC | PRN

## 2011-11-05 MED ORDER — BACITRACIN ZINC 500 UNIT/GM EX OINT
TOPICAL_OINTMENT | CUTANEOUS | Status: DC | PRN
  Administered 2011-11-05: 1 via TOPICAL

## 2011-11-05 MED ORDER — SENNOSIDES 8.6 MG PO TABS: 2.0000 | ORAL_TABLET | Freq: Every day | ORAL | Status: DC

## 2011-11-05 SURGICAL SUPPLY — 68 items
BAG DECANTER FOR FLEXI CONT (MISCELLANEOUS) IMPLANT
BANDAGE ESMARK 6X9 LF (GAUZE/BANDAGES/DRESSINGS) ×1 IMPLANT
BIT DRILL 2.5X2.75 QC CALB (BIT) ×2 IMPLANT
BIT DRILL 3.5X5.5 QC CALB (BIT) ×2 IMPLANT
BLADE SURG 15 STRL LF DISP TIS (BLADE) ×3 IMPLANT
BLADE SURG 15 STRL SS (BLADE) ×3
BNDG COHESIVE 4X5 TAN STRL (GAUZE/BANDAGES/DRESSINGS) ×2 IMPLANT
BNDG COHESIVE 6X5 TAN STRL LF (GAUZE/BANDAGES/DRESSINGS) ×2 IMPLANT
BNDG ESMARK 6X9 LF (GAUZE/BANDAGES/DRESSINGS) ×2
CHLORAPREP W/TINT 26ML (MISCELLANEOUS) ×2 IMPLANT
CLOTH BEACON ORANGE TIMEOUT ST (SAFETY) ×2 IMPLANT
COVER TABLE BACK 60X90 (DRAPES) ×2 IMPLANT
CUFF TOURNIQUET SINGLE 34IN LL (TOURNIQUET CUFF) ×2 IMPLANT
DECANTER SPIKE VIAL GLASS SM (MISCELLANEOUS) IMPLANT
DRAPE C-ARM 42X72 X-RAY (DRAPES) ×2 IMPLANT
DRAPE EXTREMITY T 121X128X90 (DRAPE) ×2 IMPLANT
DRAPE INCISE IOBAN 66X45 STRL (DRAPES) ×2 IMPLANT
DRAPE U-SHAPE 47X51 STRL (DRAPES) ×2 IMPLANT
DRAPE U-SHAPE 76X120 STRL (DRAPES) ×2 IMPLANT
DRESSING ADAPTIC 1/2  N-ADH (PACKING) IMPLANT
DRSG EMULSION OIL 3X3 NADH (GAUZE/BANDAGES/DRESSINGS) ×2 IMPLANT
DRSG PAD ABDOMINAL 8X10 ST (GAUZE/BANDAGES/DRESSINGS) ×4 IMPLANT
ELECT REM PT RETURN 9FT ADLT (ELECTROSURGICAL) ×2
ELECTRODE REM PT RTRN 9FT ADLT (ELECTROSURGICAL) ×1 IMPLANT
GLOVE BIO SURGEON STRL SZ8 (GLOVE) ×2 IMPLANT
GLOVE BIOGEL PI IND STRL 7.0 (GLOVE) ×1 IMPLANT
GLOVE BIOGEL PI IND STRL 8 (GLOVE) ×1 IMPLANT
GLOVE BIOGEL PI INDICATOR 7.0 (GLOVE) ×1
GLOVE BIOGEL PI INDICATOR 8 (GLOVE) ×1
GLOVE ECLIPSE 6.5 STRL STRAW (GLOVE) ×2 IMPLANT
GOWN PREVENTION PLUS XLARGE (GOWN DISPOSABLE) ×2 IMPLANT
GOWN PREVENTION PLUS XXLARGE (GOWN DISPOSABLE) ×2 IMPLANT
NEEDLE HYPO 22GX1.5 SAFETY (NEEDLE) IMPLANT
PACK BASIN DAY SURGERY FS (CUSTOM PROCEDURE TRAY) ×2 IMPLANT
PAD CAST 4YDX4 CTTN HI CHSV (CAST SUPPLIES) ×2 IMPLANT
PADDING CAST COTTON 4X4 STRL (CAST SUPPLIES) ×2
PADDING CAST COTTON 6X4 STRL (CAST SUPPLIES) ×2 IMPLANT
PENCIL BUTTON HOLSTER BLD 10FT (ELECTRODE) ×2 IMPLANT
PLATE ACE 100DEG 6HOLE (Plate) ×2 IMPLANT
SCREW CORTICAL 3.5MM  12MM (Screw) ×2 IMPLANT
SCREW CORTICAL 3.5MM  16MM (Screw) ×1 IMPLANT
SCREW CORTICAL 3.5MM  20MM (Screw) ×1 IMPLANT
SCREW CORTICAL 3.5MM 12MM (Screw) ×2 IMPLANT
SCREW CORTICAL 3.5MM 14MM (Screw) ×6 IMPLANT
SCREW CORTICAL 3.5MM 16MM (Screw) ×1 IMPLANT
SCREW CORTICAL 3.5MM 20MM (Screw) ×1 IMPLANT
SHEET MEDIUM DRAPE 40X70 STRL (DRAPES) ×2 IMPLANT
SPLINT FAST PLASTER 5X30 (CAST SUPPLIES) ×20
SPLINT PLASTER CAST FAST 5X30 (CAST SUPPLIES) ×20 IMPLANT
SPONGE GAUZE 4X4 12PLY (GAUZE/BANDAGES/DRESSINGS) ×2 IMPLANT
SPONGE LAP 18X18 X RAY DECT (DISPOSABLE) ×2 IMPLANT
STAPLER VISISTAT 35W (STAPLE) IMPLANT
STOCKINETTE 6  STRL (DRAPES) ×1
STOCKINETTE 6 STRL (DRAPES) ×1 IMPLANT
STRIP CLOSURE SKIN 1/2X4 (GAUZE/BANDAGES/DRESSINGS) IMPLANT
SUCTION FRAZIER TIP 10 FR DISP (SUCTIONS) ×2 IMPLANT
SUT ETHILON 3 0 PS 1 (SUTURE) ×2 IMPLANT
SUT MNCRL AB 3-0 PS2 18 (SUTURE) ×2 IMPLANT
SUT PROLENE 3 0 PS 2 (SUTURE) IMPLANT
SUT VIC AB 0 SH 27 (SUTURE) ×2 IMPLANT
SUT VIC AB 2-0 SH 27 (SUTURE) ×1
SUT VIC AB 2-0 SH 27XBRD (SUTURE) ×1 IMPLANT
SUT VICRYL 4-0 PS2 18IN ABS (SUTURE) IMPLANT
SYR BULB 3OZ (MISCELLANEOUS) ×2 IMPLANT
SYR CONTROL 10ML LL (SYRINGE) IMPLANT
TUBE CONNECTING 20X1/4 (TUBING) ×2 IMPLANT
UNDERPAD 30X30 INCONTINENT (UNDERPADS AND DIAPERS) ×2 IMPLANT
WATER STERILE IRR 1000ML POUR (IV SOLUTION) IMPLANT

## 2011-11-05 NOTE — H&P (Signed)
Michelle Cummings is an 61 y.o. female.   Chief Complaint: left ankle fracture HPI: 61 y/o female with PMH of diabetes presents with a one week h/o left ankle pain after a fall at work.  She slipped on wet grass and had an inversion injury.  Xrays at urgent care revealed a displaced left lateral malleolus fracture.  She presents now for attempt at closed reduction and casting v. Open reduction and internal fixation.  Past Medical History  Diagnosis Date  . Diabetes mellitus   . Hypertension   . History of chicken pox   . Yeast infection   . Diabetes mellitus   . Arthritis     Past Surgical History  Procedure Date  . Orif elbow fracture 03/05/2011    Procedure: OPEN REDUCTION INTERNAL FIXATION (ORIF) ELBOW/OLECRANON FRACTURE;  Surgeon: Linna Hoff, MD;  Location: [facility];  Service: Orthopedics;  Laterality: Left;  . Tonsilectomy, adenoidectomy, bilateral myringotomy and tubes   . Cesarean section   . Colonoscopy     History reviewed. No pertinent family history. Social History:  reports that she has never smoked. She does not have any smokeless tobacco history on file. She reports that she does not drink alcohol or use illicit drugs.  Allergies: No Known Allergies  Medications Prior to Admission  Medication Sig Dispense Refill  . aspirin 81 MG tablet Take 81 mg by mouth daily.      . bimatoprost (LUMIGAN) 0.03 % ophthalmic solution Place 1 drop into both eyes at bedtime.      . carvedilol (COREG) 6.25 MG tablet Take 6.25 mg by mouth 2 (two) times daily with a meal.      . furosemide (LASIX) 20 MG tablet Take 20 mg by mouth 2 (two) times daily.      Marland Kitchen HYDROcodone-acetaminophen (NORCO/VICODIN) 5-325 MG per tablet 1 to 2 tabs every 4 to 6 hours as needed for pain.  20 tablet  0  . losartan (COZAAR) 100 MG tablet Take 100 mg by mouth daily.      . rosuvastatin (CRESTOR) 10 MG tablet Take 10 mg by mouth daily.      . Saxagliptin-Metformin (KOMBIGLYZE XR) 2.05-998 MG TB24 Take by mouth.        . timolol (TIMOPTIC-XR) 0.5 % ophthalmic gel-forming Place 1 drop into both eyes daily.      . metFORMIN (GLUCOPHAGE) 1000 MG tablet Take 1,000 mg by mouth 2 (two) times daily with a meal.        Results for orders placed during the hospital encounter of 11/05/11 (from the past 48 hour(s))  POCT I-STAT, CHEM 8     Status: Abnormal   Collection Time   11/05/11 10:02 AM      Component Value Range Comment   Sodium 138  135 - 145 mEq/L    Potassium 4.3  3.5 - 5.1 mEq/L    Chloride 109  96 - 112 mEq/L    BUN 29 (*) 6 - 23 mg/dL    Creatinine, Ser 1.00  0.50 - 1.10 mg/dL    Glucose, Bld 145 (*) 70 - 99 mg/dL    Calcium, Ion 1.23  1.13 - 1.30 mmol/L    TCO2 21  0 - 100 mmol/L    Hemoglobin 12.2  12.0 - 15.0 g/dL    HCT 36.0  36.0 - 46.0 %    No results found.  ROS  No recent f/c/n/v/wt loss.  Blood pressure 130/58, pulse 72, temperature 97.9 F (36.6 C),  temperature source Oral, resp. rate 14, height 5\' 4"  (1.626 m), weight 80.287 kg (177 lb), SpO2 100.00%. Physical Exam  wn wd woman in nad.  A anc O x 4.  Mood and affect normal. EOMI.  Respirations unlabored.  Left ankle with heatlhy and intact skin that wrinkles normally.  2+ dp and pt pulses.  Feels LT normally throughoutfoot.  5/5 strength in PF and DF of the toes.  No lymphadenopathy.  Assessment/Plan Left ankle lateral malleolus fracture - to OR for attempted closed reduction and casting v. ORIF.  She understands that if the fracture cannot be reduced in closed fashion or is unstable that ORIF will be required.  The risks and benefits of the alternative treatment options have been discussed in detail.  The patient wishes to proceed with surgery and specifically understands risks of bleeding, infection, nerve damage, blood clots, need for additional surgery, amputation and death.   Michelle Cummings 2011/11/15, 10:31 AM

## 2011-11-05 NOTE — Progress Notes (Signed)
Assisted Dr. Fitzgerald with left, ultrasound guided, popliteal/saphenous block. Side rails up, monitors on throughout procedure. See vital signs in flow sheet. Tolerated Procedure well. 

## 2011-11-05 NOTE — Transfer of Care (Signed)
Immediate Anesthesia Transfer of Care Note  Patient: Michelle Cummings  Procedure(s) Performed: Procedure(s) (LRB) with comments: OPEN REDUCTION INTERNAL FIXATION (ORIF) ANKLE FRACTURE (Left) - OPEN REDUCTION INTERNAL FIXATION LEFT LATERAL MALLEOLUS FRACTURE WITH STRESS XRAYS WITH FLOUROSCOPY  Patient Location: PACU  Anesthesia Type:GA combined with regional for post-op pain  Level of Consciousness: awake and alert   Airway & Oxygen Therapy: Patient Spontanous Breathing and Patient connected to face mask oxygen  Post-op Assessment: Report given to PACU RN and Post -op Vital signs reviewed and stable  Post vital signs: Reviewed and stable  Complications: No apparent anesthesia complications

## 2011-11-05 NOTE — Anesthesia Postprocedure Evaluation (Signed)
  Anesthesia Post-op Note  Patient: Michelle Cummings  Procedure(s) Performed: Procedure(s) (LRB) with comments: OPEN REDUCTION INTERNAL FIXATION (ORIF) ANKLE FRACTURE (Left) - OPEN REDUCTION INTERNAL FIXATION LEFT LATERAL MALLEOLUS FRACTURE WITH STRESS XRAYS WITH FLOUROSCOPY  Patient Location: PACU  Anesthesia Type:GA combined with regional for post-op pain  Level of Consciousness: awake  Airway and Oxygen Therapy: Patient Spontanous Breathing and Patient connected to face mask oxygen  Post-op Pain: none  Post-op Assessment: Post-op Vital signs reviewed, Patient's Cardiovascular Status Stable, Respiratory Function Stable, Patent Airway and No signs of Nausea or vomiting  Post-op Vital Signs: Reviewed and stable  Complications: No apparent anesthesia complications

## 2011-11-05 NOTE — Anesthesia Procedure Notes (Addendum)
Anesthesia Regional Block:  Popliteal block  Pre-Anesthetic Checklist: ,, timeout performed, Correct Patient, Correct Site, Correct Laterality, Correct Procedure, Correct Position, site marked, Risks and benefits discussed, pre-op evaluation, post-op pain management  Laterality: Left  Prep: Maximum Sterile Barrier Precautions used and chloraprep       Needles:  Injection technique: Single-shot  Needle Type: Echogenic Stimulator Needle          Additional Needles:  Procedures: ultrasound guided (picture in chart) and nerve stimulator Popliteal block  Nerve Stimulator or Paresthesia:  Response: Peroneal, 0.4 mA,  Response: Tibial,   Additional Responses:   Narrative:  Start time: 11/05/2011 10:07 AM End time: 11/05/2011 10:19 AM Injection made incrementally with aspirations every 5 mL. Anesthesiologist: Ola Spurr, MD  Additional Notes: 2% Lidocaine skin wheel. Saphenous block with 10cc of 0.5% Bupivicaine plain.  Popliteal block Procedure Name: LMA Insertion Date/Time: 11/05/2011 10:48 AM Performed by: Lieutenant Diego Pre-anesthesia Checklist: Patient identified, Emergency Drugs available, Suction available and Patient being monitored Patient Re-evaluated:Patient Re-evaluated prior to inductionOxygen Delivery Method: Circle System Utilized Preoxygenation: Pre-oxygenation with 100% oxygen Intubation Type: IV induction Ventilation: Mask ventilation without difficulty LMA: LMA with gastric port inserted LMA Size: 4.0 Number of attempts: 1 Airway Equipment and Method: bite block Placement Confirmation: positive ETCO2 and breath sounds checked- equal and bilateral Tube secured with: Tape Dental Injury: Teeth and Oropharynx as per pre-operative assessment

## 2011-11-05 NOTE — Anesthesia Preprocedure Evaluation (Signed)
Anesthesia Evaluation  Patient identified by MRN, date of birth, ID band Patient awake    Reviewed: Allergy & Precautions, H&P , NPO status , Patient's Chart, lab work & pertinent test results  Airway Mallampati: II TM Distance: >3 FB Neck ROM: Full    Dental No notable dental hx. (+) Teeth Intact and Dental Advisory Given   Pulmonary neg pulmonary ROS,  breath sounds clear to auscultation  Pulmonary exam normal       Cardiovascular hypertension, On Medications Rhythm:Regular Rate:Normal     Neuro/Psych negative neurological ROS  negative psych ROS   GI/Hepatic negative GI ROS, Neg liver ROS,   Endo/Other  diabetes, Type 2, Oral Hypoglycemic Agents  Renal/GU negative Renal ROS  negative genitourinary   Musculoskeletal   Abdominal   Peds  Hematology negative hematology ROS (+)   Anesthesia Other Findings   Reproductive/Obstetrics negative OB ROS                           Anesthesia Physical Anesthesia Plan  ASA: II  Anesthesia Plan: General and Regional   Post-op Pain Management:    Induction: Intravenous  Airway Management Planned: LMA  Additional Equipment:   Intra-op Plan:   Post-operative Plan: Extubation in OR  Informed Consent: I have reviewed the patients History and Physical, chart, labs and discussed the procedure including the risks, benefits and alternatives for the proposed anesthesia with the patient or authorized representative who has indicated his/her understanding and acceptance.   Dental advisory given  Plan Discussed with: CRNA  Anesthesia Plan Comments:         Anesthesia Quick Evaluation

## 2011-11-05 NOTE — Brief Op Note (Signed)
11/05/2011  11:46 AM  PATIENT:  Michelle Cummings  61 y.o. female  PRE-OPERATIVE DIAGNOSIS: left ankle lateral malleolus fracture  POST-OPERATIVE DIAGNOSIS:  Same  Procedure(s): 1.  ORIF left ankle lateral malleolus fracture 2.  Fluoro 3.  Stress exam of left ankle under fluoro  SURGEON:  Wylene Simmer, MD  ASSISTANT: n/a  ANESTHESIA:   General, regional  EBL:  minimal   TOURNIQUET:   Total Tourniquet Time Documented: Thigh (Left) - 30 minutes  COMPLICATIONS:  None apparent  DISPOSITION:  Extubated, awake and stable to recovery.  DICTATION ID:  [id number]

## 2011-11-06 ENCOUNTER — Encounter (HOSPITAL_BASED_OUTPATIENT_CLINIC_OR_DEPARTMENT_OTHER): Payer: Self-pay | Admitting: Orthopedic Surgery

## 2011-11-06 NOTE — Op Note (Signed)
Michelle Cummings, Michelle Cummings                  ACCOUNT NO.:  [id number]  MEDICAL RECORD NO.:  [id number]  LOCATION:                                 FACILITY:  PHYSICIAN:  Wylene Simmer, MD        DATE OF BIRTH:  [birth year]  DATE OF PROCEDURE:  11/05/2011 DATE OF DISCHARGE:                              OPERATIVE REPORT   PREOPERATIVE DIAGNOSIS:  Left ankle lateral malleolus fracture.  POSTOPERATIVE DIAGNOSIS:  Left ankle lateral malleolus fracture.  PROCEDURE: 1. Open reduction and internal fixation of left ankle lateral     malleolus fracture. 2. Intraoperative interpretation of fluoroscopic imaging. 3. Stress examination of left ankle under fluoroscopy.  SURGEON:  Wylene Simmer, MD  ANESTHESIA:  General, regional.  ESTIMATED BLOOD LOSS:  Minimal.  TOURNIQUET TIME:  30 minutes at 250 mmHg.  COMPLICATIONS:  None apparent.  DISPOSITION:  Extubated awake and stable to recovery.  INDICATION FOR PROCEDURE:  The patient is a 61 year old woman who fell at work last week when she slipped on some wet grass.  She had an inversion injury and x-rays revealed a displaced lateral malleolus fracture.  She presents now for attempted closed reduction and casting versus open reduction and internal fixation of her left ankle displaced lateral malleolus fracture.  She understands the risks and benefits, the alternative treatment options and elects to proceed with this course of treatment.  She specifically understands that she will need surgical treatment if her ankle could not be reduced adequately in closed fashion or if it unstable after adequate reduction.  She specifically understands surgical risks of bleeding, infection, nerve damage, blood clots, need for additional surgery, chronic pain, arthritis amputation, and death.  PROCEDURE IN DETAIL:  After preoperative consent was obtained and the correct operative site was identified, the patient was brought to the operating room and placed supine on  the operating table.  General anesthesia was induced.  Surgical time-out was taken.  An attempt was made at reduction of the lateral malleolus fracture.  AP and lateral fluoroscopic images showed that the fracture could not be adequately reduced.  The decision was made at that time to proceed to surgical treatment.  Preoperative antibiotics were administered.  The left lower extremity was prepped and draped in standard sterile fashion with a tourniquet around the thigh.  The extremity was exsanguinated and the tourniquet was inflated to 250 mmHg.  A longitudinal incision was made over the lateral malleolus.  Sharp dissection was carried down through the skin and subcutaneous tissue.  Care was taken to protect the branches of the superficial peroneal nerve.  The fracture site was exposed.  It was cleaned of all hematoma.  It was reduced and clamped with a lobster claw.  A 3.5 mm fully-threaded screw was inserted in lag fashion from posterior to anterior.  It was noted to have excellent purchase and compressed the fracture site appropriately.  A six-hole 1/3 tubular plate was then contoured to fit the lateral malleolus.  It was fixed proximally with 3 bicortical screws and distally with 3 unicortical screws.  AP, mortise, and lateral views were obtained showing appropriate reduction of the fracture and appropriate  position and length of all hardware.  Mortise view was obtained.  Dorsiflexion and external rotation stress was applied.  There was no evidence of widening of the medial clear space or the syndesmosis.  The wound was irrigated copiously, 2-0 Vicryl simple sutures were used to approximate the periosteum and deep subcutaneous tissue over the plate.  The superficial subcutaneous tissue was then approximated with inverted simple sutures of 3-0 Monocryl.  The skin was closed with running 3-0 nylon.  Sterile dressings were applied followed by well-padded short-leg splint.  Tourniquet  was released at 30 minutes after application of the dressings.  The patient was then awakened from anesthesia and transported to the recovery room in a stable condition.  FOLLOWUP PLAN:  The patient will be nonweightbearing on the left lower extremity.  She will follow up with me in 2 weeks for suture removal and conversion to a short-leg cast.     Wylene Simmer, MD     JH/MEDQ  D:  11/05/2011  T:  11/06/2011  Job:  [id number]

## 2011-11-11 ENCOUNTER — Ambulatory Visit
Admission: RE | Admit: 2011-11-11 | Discharge: 2011-11-11 | Disposition: A | Discharge status: Home / Self Care | Payer: BC Managed Care – PPO | Source: Ambulatory Visit | Attending: Endocrinology | Admitting: Endocrinology

## 2011-11-11 DIAGNOSIS — E049 Nontoxic goiter, unspecified: Secondary | ICD-10-CM

## 2012-03-03 NOTE — H&P (Signed)
Assessment   Diabetes (250.00) (E11.9).  Hypertension (401.9) (I10).  Neoplasm of thyroid (239.7) (D44.0). Discussed  Bilateral thyroid nodules, Hurthle cell changes and calcifications seen on ultrasound. We discussed the risk of possible carcinoma. Recommend right thyroid lobectomy, partial left thyroid excision including the isthmus to include the left nodule, with frozen section and possible total thyroidectomy. We discussed in detail the nature of the surgery, the need for overnight stay, the risks of recurrent nerve damage and hypocalcemia. All questions were answered. She will schedule in March, I think that's reasonable. Reason For Visit  Michelle Cummings is here today at the kind request of Balan, Bindubal for consultation and opinion for thyroid. HPI  Thyroid nodules incidentally found on imaging after a fall earlier in the year. Ultrasound revealed a multi-nodular changes, with a dominant nodule on the right, and a second dominant nodule left isthmus. Both sides were needled and her thistle changes were seen bilaterally. Ultrasound revealed calcifications as well. She's here for consideration of thyroid surgery. The dominant nodule on the right was 2.3 cm. The left isthmus lesion is 2.7 cm. Allergies  No Known Drug Allergies. Current Meds  Aspirin Low Dose 81 MG Oral Tablet;; RPT Cozaar 100 MG Oral Tablet (Losartan Potassium);; RPT Furosemide TABS;; RPT Potassium TABS;; RPT Crestor TABS;; RPT Carvedilol TABS;; RPT Kombiglyze XR 2.05-998 MG Oral Tablet Extended Release 24 Hour;; RPT. Active Problems  Diabetes  (250.00) (E11.9) Glaucoma  (365.9,365.70) (H40.9) Hypertension  (401.9) (I10). [place]  Ankle Surgery Cesarean Section Tonsillectomy. Family Hx  Family history of diabetes mellitus (V18.0) (Z83.3) Family history of hypertension: Mother,Grandmother (V17.49) (Z83.49). Personal Hx  Never smoker No alcohol use No caffeine use Non-smoker (V49.89) (Z78.9). ROS  Systemic: Not  feeling tired (fatigue).  No fever, no night sweats, and no recent weight loss. Head: No headache. Eyes: No eye symptoms. Otolaryngeal: No hearing loss, no earache, no tinnitus, and no purulent nasal discharge.  No nasal passage blockage (stuffiness), no snoring, no sneezing, no hoarseness, and no sore throat. Cardiovascular: No chest pain or discomfort  and no palpitations. Pulmonary: No dyspnea, no cough, and no wheezing. Gastrointestinal: No dysphagia  and no heartburn.  No nausea, no abdominal pain, and no melena.  No diarrhea. Genitourinary: No dysuria. Endocrine: No muscle weakness. Musculoskeletal: No calf muscle cramps, no arthralgias, and no soft tissue swelling. Neurological: No dizziness, no fainting, no tingling, and no numbness. Psychological: No anxiety  and no depression. Skin: No rash. 12 system ROS was obtained and reviewed on the Health Maintenance form dated today.  Positive responses are shown above.  If the symptom is not checked, the patient has denied it. Vital Signs   Recorded by [facility] on 22 Dec 2011 10:52 AM BP:180/100,  Height: 64 in, Weight: 173 lb, BMI: 29.7 kg/m2,  BMI Calculated: 29.70 ,  BSA Calculated: 1.84. Physical Exam  APPEARANCE: Well developed, well nourished, in no acute distress.  Normal affect, in a pleasant mood.  Oriented to time, place and person. COMMUNICATION: Normal voice   HEAD & FACE:  No scars, lesions or masses of head and face.  Sinuses nontender to palpation.  Salivary glands without mass or tenderness.  Facial strength symmetric.  No facial lesion, scars, or mass. EYES: EOMI with normal primary gaze alignment. Visual acuity grossly intact.  PERRLA EXTERNAL EAR & NOSE: No scars, lesions or masses  EAC & TYMPANIC MEMBRANE:  EAC shows no obstructing lesions or debris and tympanic membranes are normal bilaterally with good movement to insufflation. GROSS  HEARING: Normal  TMJ:  Nontender  INTRANASAL EXAM: No polyps or  purulence.  NASOPHARYNX: Normal, without lesions. LIPS, TEETH & GUMS: No lip lesions, normal dentition and normal gums. ORAL CAVITY/OROPHARYNX:  Oral mucosa moist without lesion or asymmetry of the palate, tongue, tonsil or posterior pharynx. LARYNX (mirror exam):  No lesions of the epiglottis, false cord or TVC's and cords move well to phonation. HYPOPHARYNX (mirror exam): No lesions, asymmetry or pooling of secretions. NECK:  Supple without adenopathy or mass. THYROID: Fullness with a palpable nodule on the right, am unable to palpate the left-sided nodule.  NEUROLOGIC:  No gross CN deficits. No nystagmus noted.   LYMPHATIC:  No enlarged nodes palpable.   We rechecked her blood pressure after the consultation and it was 120/72. Signature  Electronically signed by : Izora Gala  M.D.; 12/22/2011 11:19 AM EST.

## 2012-03-08 ENCOUNTER — Encounter (HOSPITAL_COMMUNITY): Payer: Self-pay | Admitting: Respiratory Therapy

## 2012-03-15 ENCOUNTER — Encounter (HOSPITAL_COMMUNITY)
Admission: RE | Admit: 2012-03-15 | Discharge: 2012-03-15 | Disposition: A | Discharge status: Home / Self Care | Payer: BC Managed Care – PPO | Source: Ambulatory Visit | Attending: Otolaryngology | Admitting: Otolaryngology

## 2012-03-15 ENCOUNTER — Encounter (HOSPITAL_COMMUNITY): Payer: Self-pay

## 2012-03-15 DIAGNOSIS — E785 Hyperlipidemia, unspecified: Secondary | ICD-10-CM | POA: Insufficient documentation

## 2012-03-15 DIAGNOSIS — N182 Chronic kidney disease, stage 2 (mild): Secondary | ICD-10-CM | POA: Insufficient documentation

## 2012-03-15 DIAGNOSIS — Z01812 Encounter for preprocedural laboratory examination: Secondary | ICD-10-CM | POA: Insufficient documentation

## 2012-03-15 DIAGNOSIS — I129 Hypertensive chronic kidney disease with stage 1 through stage 4 chronic kidney disease, or unspecified chronic kidney disease: Secondary | ICD-10-CM | POA: Insufficient documentation

## 2012-03-15 DIAGNOSIS — Z01811 Encounter for preprocedural respiratory examination: Secondary | ICD-10-CM | POA: Insufficient documentation

## 2012-03-15 DIAGNOSIS — E119 Type 2 diabetes mellitus without complications: Secondary | ICD-10-CM | POA: Insufficient documentation

## 2012-03-15 DIAGNOSIS — Z01818 Encounter for other preprocedural examination: Secondary | ICD-10-CM | POA: Insufficient documentation

## 2012-03-15 HISTORY — DX: Unspecified cataract: H26.9

## 2012-03-15 HISTORY — DX: Chronic kidney disease, unspecified: N18.9

## 2012-03-15 HISTORY — DX: Hyperlipidemia, unspecified: E78.5

## 2012-03-15 HISTORY — DX: Unspecified glaucoma: H40.9

## 2012-03-15 LAB — SURGICAL PCR SCREEN
MRSA, PCR: NEGATIVE
Staphylococcus aureus: POSITIVE — AB

## 2012-03-15 LAB — BASIC METABOLIC PANEL
BUN: 32 mg/dL — ABNORMAL HIGH (ref 6–23)
CO2: 22 mEq/L (ref 19–32)
Calcium: 9.9 mg/dL (ref 8.4–10.5)
Chloride: 101 mEq/L (ref 96–112)
Creatinine, Ser: 0.92 mg/dL (ref 0.50–1.10)
GFR calc Af Amer: 75 mL/min — ABNORMAL LOW (ref >90)
GFR calc non Af Amer: 65 mL/min — ABNORMAL LOW (ref >90)
Glucose, Bld: 122 mg/dL — ABNORMAL HIGH (ref 70–99)
Potassium: 5.2 mEq/L — ABNORMAL HIGH (ref 3.5–5.1)
Sodium: 134 mEq/L — ABNORMAL LOW (ref 135–145)

## 2012-03-15 LAB — CBC
HCT: 31.3 % — ABNORMAL LOW (ref 36.0–46.0)
Hemoglobin: 10.3 g/dL — ABNORMAL LOW (ref 12.0–15.0)
MCH: 27 pg (ref 26.0–34.0)
MCHC: 32.9 g/dL (ref 30.0–36.0)
MCV: 82.2 fL (ref 78.0–100.0)
Platelets: 320 10*3/uL (ref 150–400)
RBC: 3.81 MIL/uL — ABNORMAL LOW (ref 3.87–5.11)
RDW: 13.3 % (ref 11.5–15.5)
WBC: 8.5 10*3/uL (ref 4.0–10.5)

## 2012-03-15 NOTE — Pre-Procedure Instructions (Signed)
Chavela Crincoli Valera  03/15/2012   Your procedure is scheduled on:  Wed, Mar 19 @ 8:30 AM  Report to [place] at 6:30 AM.  Call this number if you have problems the morning of surgery: [phone]     Do not eat food or drink liquids after midnight.   Take these medicines the morning of surgery with A SIP OF WATER: Carvedilol(Coreg) and Timolol(Timoptic-eye drops)   Do not wear jewelry, make-up or nail polish.  Do not wear lotions, powders, or perfumes. You may wear deodorant.  Do not shave 48 hours prior to surgery.   Do not bring valuables to the hospital.  Contacts, dentures or bridgework may not be worn into surgery.  Leave suitcase in the car. After surgery it may be brought to your room.  For patients admitted to the hospital, checkout time is 11:00 AM the day of  discharge.   Patients discharged the day of surgery will not be allowed to drive  home.    Special Instructions: Shower using CHG 2 nights before surgery and the night before surgery.  If you shower the day of surgery use CHG.  Use special wash - you have one bottle of CHG for all showers.  You should use approximately 1/3 of the bottle for each shower.   Please read over the following fact sheets that you were given: Pain Booklet, Coughing and Deep Breathing, MRSA Information and Surgical Site Infection Prevention

## 2012-03-15 NOTE — Progress Notes (Signed)
Pt saw Dr.Ganji in Oct 2013-to request report  Echo/Stress Test/EKG to be requested from [facility]  Denies ever having a heart cath  Medical Md is Dr.Robyn [facility]

## 2012-03-16 ENCOUNTER — Encounter (HOSPITAL_COMMUNITY): Payer: Self-pay | Admitting: Vascular Surgery

## 2012-03-16 NOTE — Progress Notes (Addendum)
Anesthesia chart review: Patient is a 62 year old female scheduled for right thyroid lobectomy and left partial thyroid lobectomy versus total thyroidectomy by Dr. Constance Holster on 03/23/2012. 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 Moshe Cipro note on 11/26/11 patient has CKD stage II likely due to HTN and DM), glaucoma, arthritis, cataracts. She underwent left elbow tendon repair on 03/05/2011 and ORIF of left ankle fracture on 11/05/2011. PCP is Dr. Glendale Chard. Endocrinologist Dr. Chalmers Cater.  Her PAT BP was elevated at 181/88 during her PAT visit, but was not rechecked.  This reading was similar to her result at her visit with Dr. Constance Holster, but it was 120/72 when rechecked.  Preoperative labs noted. K+ 5.2, Cr 0.92.  H/H 10.3/31.3. I'll attempt to get a last office note from [place] regarding the specifics of her renal history.   Patient was evaluated by Cardiologist Dr. Einar Gip on 10/29/11 for a pre-operative evaluation prior to her ankle surgery.  EKG then showed NSR, first degree AVB, LAE, poor r wave progression. Negative T wave in aVL, LVH. She underwent a nuclear stress test on 10/30/11 that showed no evidence of ischemia or scar. Dynamic images reveal normal wall motion endocardial thickening. Left ventricular ejection fraction was estimated to be 69%.  She is for a CXR on arrival.  She will be evaluated by her assigned anesthesiologist on the day of surgery, but if no acute changes then would anticipate she could proceed as planned.  George Hugh [facility] Short Stay Center/Anesthesiology Phone [phone] 03/16/2012 3:33 PM

## 2012-03-17 ENCOUNTER — Encounter (HOSPITAL_COMMUNITY): Payer: Self-pay

## 2012-03-23 ENCOUNTER — Ambulatory Visit: Admit: 2012-03-23 | Payer: BC Managed Care – PPO | Admitting: Otolaryngology

## 2012-03-23 SURGERY — THYROIDECTOMY
Anesthesia: General | Laterality: Bilateral

## 2012-07-06 ENCOUNTER — Encounter (HOSPITAL_COMMUNITY): Payer: Self-pay | Admitting: Pharmacy Technician

## 2012-07-12 NOTE — H&P (Signed)
Assessment   Diabetes (250.00) (E11.9).  Hypertension (401.9) (I10).  Neoplasm of thyroid (239.7) (D44.0). Discussed  Bilateral thyroid nodules, Hurthle cell changes and calcifications seen on ultrasound. We discussed the risk of possible carcinoma. Recommend right thyroid lobectomy, partial left thyroid excision including the isthmus to include the left nodule, with frozen section and possible total thyroidectomy. We discussed in detail the nature of the surgery, the need for overnight stay, the risks of recurrent nerve damage and hypocalcemia. All questions were answered. She will schedule in March, I think that's reasonable. Reason For Visit  Michelle Cummings is here today at the kind request of Balan, Bindubal for consultation and opinion for thyroid. HPI  Thyroid nodules incidentally found on imaging after a fall earlier in the year. Ultrasound revealed a multi-nodular changes, with a dominant nodule on the right, and a second dominant nodule left isthmus. Both sides were needled and her thistle changes were seen bilaterally. Ultrasound revealed calcifications as well. She's here for consideration of thyroid surgery. The dominant nodule on the right was 2.3 cm. The left isthmus lesion is 2.7 cm. Allergies  No Known Drug Allergies. Current Meds  Aspirin Low Dose 81 MG Oral Tablet;; RPT Cozaar 100 MG Oral Tablet (Losartan Potassium);; RPT Furosemide TABS;; RPT Potassium TABS;; RPT Crestor TABS;; RPT Carvedilol TABS;; RPT Kombiglyze XR 2.05-998 MG Oral Tablet Extended Release 24 Hour;; RPT. Active Problems  Diabetes  (250.00) (E11.9) Glaucoma  (365.9,365.70) (H40.9) Hypertension  (401.9) (I10). [place]  Ankle Surgery Cesarean Section Tonsillectomy. Family Hx  Family history of diabetes mellitus (V18.0) (Z83.3) Family history of hypertension: Mother,Grandmother (V17.49) (Z83.49). Personal Hx  Never smoker No alcohol use No caffeine use Non-smoker (V49.89) (Z78.9). ROS  Systemic: Not  feeling tired (fatigue).  No fever, no night sweats, and no recent weight loss. Head: No headache. Eyes: No eye symptoms. Otolaryngeal: No hearing loss, no earache, no tinnitus, and no purulent nasal discharge.  No nasal passage blockage (stuffiness), no snoring, no sneezing, no hoarseness, and no sore throat. Cardiovascular: No chest pain or discomfort  and no palpitations. Pulmonary: No dyspnea, no cough, and no wheezing. Gastrointestinal: No dysphagia  and no heartburn.  No nausea, no abdominal pain, and no melena.  No diarrhea. Genitourinary: No dysuria. Endocrine: No muscle weakness. Musculoskeletal: No calf muscle cramps, no arthralgias, and no soft tissue swelling. Neurological: No dizziness, no fainting, no tingling, and no numbness. Psychological: No anxiety  and no depression. Skin: No rash. 12 system ROS was obtained and reviewed on the Health Maintenance form dated today.  Positive responses are shown above.  If the symptom is not checked, the patient has denied it. Vital Signs   Recorded by [facility] on 22 Dec 2011 10:52 AM BP:180/100,  Height: 64 in, Weight: 173 lb, BMI: 29.7 kg/m2,  BMI Calculated: 29.70 ,  BSA Calculated: 1.84. Physical Exam  APPEARANCE: Well developed, well nourished, in no acute distress.  Normal affect, in a pleasant mood.  Oriented to time, place and person. COMMUNICATION: Normal voice   HEAD & FACE:  No scars, lesions or masses of head and face.  Sinuses nontender to palpation.  Salivary glands without mass or tenderness.  Facial strength symmetric.  No facial lesion, scars, or mass. EYES: EOMI with normal primary gaze alignment. Visual acuity grossly intact.  PERRLA EXTERNAL EAR & NOSE: No scars, lesions or masses  EAC & TYMPANIC MEMBRANE:  EAC shows no obstructing lesions or debris and tympanic membranes are normal bilaterally with good movement to insufflation. GROSS  HEARING: Normal  TMJ:  Nontender  INTRANASAL EXAM: No polyps or  purulence.  NASOPHARYNX: Normal, without lesions. LIPS, TEETH & GUMS: No lip lesions, normal dentition and normal gums. ORAL CAVITY/OROPHARYNX:  Oral mucosa moist without lesion or asymmetry of the palate, tongue, tonsil or posterior pharynx. LARYNX (mirror exam):  No lesions of the epiglottis, false cord or TVC's and cords move well to phonation. HYPOPHARYNX (mirror exam): No lesions, asymmetry or pooling of secretions. NECK:  Supple without adenopathy or mass. THYROID: Fullness with a palpable nodule on the right, am unable to palpate the left-sided nodule.  NEUROLOGIC:  No gross CN deficits. No nystagmus noted.   LYMPHATIC:  No enlarged nodes palpable.   We rechecked her blood pressure after the consultation and it was 120/72. Signature  Electronically signed by : Izora Gala  M.D.; 12/22/2011 11:19 AM EST.

## 2012-07-12 NOTE — Pre-Procedure Instructions (Addendum)
Michelle Cummings  07/12/2012   Your procedure is scheduled on:  Wedenesday, July 16th.  Report to [place] at 7:15AM.  Call this number if you have problems the morning of surgery: [phone]   Remember:   Do not eat food or drink liquids after midnight.   Take these medicines the morning of surgery with A SIP OF WATER: carvedilol (COREG).  May use eye drops.    Do not wear jewelry, make-up or nail polish.  Do not wear lotions, powders, or perfumes. You may wear deodorant.  Do not shave 48 hours prior to surgery.   Do not bring valuables to the hospital.  [facility] is not responsible for any belongings or valuables.  Contacts, dentures or bridgework may not be worn into surgery.  Leave suitcase in the car. After surgery it may be brought to your room.  For patients admitted to the hospital, checkout time is 11:00 AM the day of discharge.   Patients discharged the day of surgery will not be allowed to drive home.  Name and phone number of your driver:-  Special Instructions: Shower using CHG 2 nights before surgery and the night before surgery.  If you shower the day of surgery use CHG.  Use special wash - you have one bottle of CHG for all showers.  You should use approximately 1/3 of the bottle for each shower.   Please read over the following fact sheets that you were given: Pain Booklet, Coughing and Deep Breathing and Surgical Site Infection Prevention

## 2012-07-13 ENCOUNTER — Encounter (HOSPITAL_COMMUNITY)
Admission: RE | Admit: 2012-07-13 | Discharge: 2012-07-13 | Disposition: A | Discharge status: Home / Self Care | Payer: BC Managed Care – PPO | Source: Ambulatory Visit | Attending: Otolaryngology | Admitting: Otolaryngology

## 2012-07-13 ENCOUNTER — Encounter (HOSPITAL_COMMUNITY)
Admission: RE | Admit: 2012-07-13 | Discharge: 2012-07-13 | Disposition: A | Discharge status: Home / Self Care | Payer: BC Managed Care – PPO | Source: Ambulatory Visit | Attending: Anesthesiology | Admitting: Anesthesiology

## 2012-07-13 ENCOUNTER — Encounter (HOSPITAL_COMMUNITY): Payer: Self-pay

## 2012-07-13 DIAGNOSIS — Z01818 Encounter for other preprocedural examination: Secondary | ICD-10-CM | POA: Insufficient documentation

## 2012-07-13 DIAGNOSIS — Z01812 Encounter for preprocedural laboratory examination: Secondary | ICD-10-CM | POA: Insufficient documentation

## 2012-07-13 HISTORY — DX: Dermatitis, unspecified: L30.9

## 2012-07-13 HISTORY — DX: Cardiac murmur, unspecified: R01.1

## 2012-07-13 HISTORY — DX: Other seasonal allergic rhinitis: J30.2

## 2012-07-13 LAB — BASIC METABOLIC PANEL
BUN: 23 mg/dL (ref 6–23)
CO2: 26 mEq/L (ref 19–32)
Calcium: 9.5 mg/dL (ref 8.4–10.5)
Chloride: 101 mEq/L (ref 96–112)
Creatinine, Ser: 1.01 mg/dL (ref 0.50–1.10)
GFR calc Af Amer: 67 mL/min — ABNORMAL LOW (ref >90)
GFR calc non Af Amer: 58 mL/min — ABNORMAL LOW (ref >90)
Glucose, Bld: 155 mg/dL — ABNORMAL HIGH (ref 70–99)
Potassium: 5.3 mEq/L — ABNORMAL HIGH (ref 3.5–5.1)
Sodium: 134 mEq/L — ABNORMAL LOW (ref 135–145)

## 2012-07-13 LAB — CBC
HCT: 34.5 % — ABNORMAL LOW (ref 36.0–46.0)
Hemoglobin: 11 g/dL — ABNORMAL LOW (ref 12.0–15.0)
MCH: 26.7 pg (ref 26.0–34.0)
MCHC: 31.9 g/dL (ref 30.0–36.0)
MCV: 83.7 fL (ref 78.0–100.0)
Platelets: 276 10*3/uL (ref 150–400)
RBC: 4.12 MIL/uL (ref 3.87–5.11)
RDW: 12.9 % (ref 11.5–15.5)
WBC: 7.2 10*3/uL (ref 4.0–10.5)

## 2012-07-19 MED ORDER — CEFAZOLIN SODIUM-DEXTROSE 2-3 GM-% IV SOLR
2.0000 g | INTRAVENOUS | Status: AC
  Administered 2012-07-20: 2 g via INTRAVENOUS
  Filled 2012-07-19: qty 50

## 2012-07-20 ENCOUNTER — Encounter (HOSPITAL_COMMUNITY): Payer: Self-pay | Admitting: Anesthesiology

## 2012-07-20 ENCOUNTER — Ambulatory Visit (HOSPITAL_COMMUNITY): Payer: BC Managed Care – PPO | Admitting: Anesthesiology

## 2012-07-20 ENCOUNTER — Encounter (HOSPITAL_COMMUNITY): Payer: Self-pay | Admitting: *Deleted

## 2012-07-20 ENCOUNTER — Encounter (HOSPITAL_COMMUNITY)
Admission: RE | Disposition: A | Discharge status: Home / Self Care | Payer: Self-pay | Source: Ambulatory Visit | Attending: Otolaryngology

## 2012-07-20 ENCOUNTER — Observation Stay (HOSPITAL_COMMUNITY)
Admission: RE | Admit: 2012-07-20 | Discharge: 2012-07-21 | Disposition: A | Discharge status: Home / Self Care | Payer: BC Managed Care – PPO | Source: Ambulatory Visit | Attending: Otolaryngology | Admitting: Otolaryngology

## 2012-07-20 DIAGNOSIS — H409 Unspecified glaucoma: Secondary | ICD-10-CM | POA: Insufficient documentation

## 2012-07-20 DIAGNOSIS — E042 Nontoxic multinodular goiter: Principal | ICD-10-CM | POA: Insufficient documentation

## 2012-07-20 DIAGNOSIS — E119 Type 2 diabetes mellitus without complications: Secondary | ICD-10-CM | POA: Insufficient documentation

## 2012-07-20 DIAGNOSIS — I10 Essential (primary) hypertension: Secondary | ICD-10-CM | POA: Insufficient documentation

## 2012-07-20 DIAGNOSIS — E041 Nontoxic single thyroid nodule: Secondary | ICD-10-CM

## 2012-07-20 HISTORY — PX: THYROID LOBECTOMY: SHX420

## 2012-07-20 HISTORY — PX: THYROIDECTOMY: SHX17

## 2012-07-20 LAB — POCT I-STAT 4, (NA,K, GLUC, HGB,HCT)
Glucose, Bld: 182 mg/dL — ABNORMAL HIGH (ref 70–99)
HCT: 36 % (ref 36.0–46.0)
Hemoglobin: 12.2 g/dL (ref 12.0–15.0)
Potassium: 4.5 mEq/L (ref 3.5–5.1)
Sodium: 139 mEq/L (ref 135–145)

## 2012-07-20 LAB — GLUCOSE, CAPILLARY
Glucose-Capillary: 182 mg/dL — ABNORMAL HIGH (ref 70–99)
Glucose-Capillary: 145 mg/dL — ABNORMAL HIGH (ref 70–99)
Glucose-Capillary: 148 mg/dL — ABNORMAL HIGH (ref 70–99)
Glucose-Capillary: 109 mg/dL — ABNORMAL HIGH (ref 70–99)

## 2012-07-20 SURGERY — THYROIDECTOMY
Anesthesia: General | Site: Neck | Laterality: Right | Wound class: Clean

## 2012-07-20 MED ORDER — HYDROCODONE-ACETAMINOPHEN 7.5-325 MG/15ML PO SOLN
ORAL | Status: AC
  Administered 2012-07-20: 15 mL
  Filled 2012-07-20: qty 15

## 2012-07-20 MED ORDER — BIMATOPROST 0.03 % OP SOLN
1.0000 [drp] | Freq: Every day | OPHTHALMIC | Status: DC
  Filled 2012-07-20: qty 2.5

## 2012-07-20 MED ORDER — OXYCODONE HCL 5 MG/5ML PO SOLN: 5.0000 mg | Freq: Once | ORAL | Status: DC | PRN

## 2012-07-20 MED ORDER — CARVEDILOL 6.25 MG PO TABS
6.2500 mg | ORAL_TABLET | Freq: Two times a day (BID) | ORAL | Status: DC
Start: 2012-07-20 — End: 2012-07-21
  Administered 2012-07-20: 6.25 mg via ORAL
  Filled 2012-07-20 (×4): qty 1

## 2012-07-20 MED ORDER — NEOSTIGMINE METHYLSULFATE 1 MG/ML IJ SOLN
INTRAMUSCULAR | Status: DC | PRN
  Administered 2012-07-20: 3 mg via INTRAVENOUS

## 2012-07-20 MED ORDER — ATORVASTATIN CALCIUM 10 MG PO TABS
10.0000 mg | ORAL_TABLET | Freq: Every day | ORAL | Status: DC
  Administered 2012-07-20: 10 mg via ORAL
  Filled 2012-07-20 (×2): qty 1

## 2012-07-20 MED ORDER — PROMETHAZINE HCL 25 MG PO TABS: 25.0000 mg | ORAL_TABLET | Freq: Four times a day (QID) | ORAL | Status: DC | PRN

## 2012-07-20 MED ORDER — ONDANSETRON HCL 4 MG/2ML IJ SOLN
INTRAMUSCULAR | Status: DC | PRN
  Administered 2012-07-20: 4 mg via INTRAVENOUS

## 2012-07-20 MED ORDER — MIDAZOLAM HCL 5 MG/5ML IJ SOLN
INTRAMUSCULAR | Status: DC | PRN
  Administered 2012-07-20: 1 mg via INTRAVENOUS

## 2012-07-20 MED ORDER — GLYCOPYRROLATE 0.2 MG/ML IJ SOLN
INTRAMUSCULAR | Status: DC | PRN
  Administered 2012-07-20: 0.6 mg via INTRAVENOUS

## 2012-07-20 MED ORDER — SODIUM CHLORIDE 0.9 % IR SOLN
Status: DC | PRN
  Administered 2012-07-20: 1

## 2012-07-20 MED ORDER — LACTATED RINGERS IV SOLN
INTRAVENOUS | Status: DC
  Administered 2012-07-20: 08:00:00 via INTRAVENOUS

## 2012-07-20 MED ORDER — VITAMIN D 1000 UNITS PO CAPS: 1000.0000 [IU] | ORAL_CAPSULE | Freq: Two times a day (BID) | ORAL | Status: DC

## 2012-07-20 MED ORDER — IRON 66 MG PO TABS: 1.0000 | ORAL_TABLET | Freq: Every day | ORAL | Status: DC

## 2012-07-20 MED ORDER — FERROUS SULFATE 325 (65 FE) MG PO TABS
325.0000 mg | ORAL_TABLET | Freq: Every day | ORAL | Status: DC
  Administered 2012-07-20: 325 mg via ORAL
  Filled 2012-07-20 (×3): qty 1

## 2012-07-20 MED ORDER — HYDROMORPHONE HCL PF 1 MG/ML IJ SOLN
INTRAMUSCULAR | Status: AC
  Filled 2012-07-20: qty 1

## 2012-07-20 MED ORDER — PROMETHAZINE HCL 25 MG RE SUPP: 25.0000 mg | Freq: Four times a day (QID) | RECTAL | Status: DC | PRN

## 2012-07-20 MED ORDER — ROCURONIUM BROMIDE 100 MG/10ML IV SOLN
INTRAVENOUS | Status: DC | PRN
  Administered 2012-07-20: 50 mg via INTRAVENOUS

## 2012-07-20 MED ORDER — ASPIRIN 81 MG PO TABS: 81.0000 mg | ORAL_TABLET | Freq: Every day | ORAL | Status: DC

## 2012-07-20 MED ORDER — FUROSEMIDE 20 MG PO TABS
20.0000 mg | ORAL_TABLET | Freq: Every day | ORAL | Status: DC
  Administered 2012-07-20: 20 mg via ORAL
  Filled 2012-07-20 (×2): qty 1

## 2012-07-20 MED ORDER — TIMOLOL MALEATE 0.5 % OP SOLG
1.0000 [drp] | Freq: Every day | OPHTHALMIC | Status: DC
  Filled 2012-07-20: qty 5

## 2012-07-20 MED ORDER — OXYCODONE HCL 5 MG PO TABS: 5.0000 mg | ORAL_TABLET | Freq: Once | ORAL | Status: DC | PRN

## 2012-07-20 MED ORDER — IBUPROFEN 100 MG/5ML PO SUSP
400.0000 mg | Freq: Four times a day (QID) | ORAL | Status: DC | PRN
  Filled 2012-07-20: qty 20

## 2012-07-20 MED ORDER — VITAMIN D3 25 MCG (1000 UNIT) PO TABS
1000.0000 [IU] | ORAL_TABLET | Freq: Two times a day (BID) | ORAL | Status: DC
  Administered 2012-07-20: 1000 [IU] via ORAL
  Filled 2012-07-20 (×5): qty 1

## 2012-07-20 MED ORDER — LACTATED RINGERS IV SOLN
INTRAVENOUS | Status: DC | PRN
  Administered 2012-07-20 (×2): via INTRAVENOUS

## 2012-07-20 MED ORDER — LOSARTAN POTASSIUM 50 MG PO TABS
100.0000 mg | ORAL_TABLET | Freq: Every day | ORAL | Status: DC
Start: 2012-07-20 — End: 2012-07-21
  Administered 2012-07-20: 100 mg via ORAL
  Filled 2012-07-20 (×2): qty 2

## 2012-07-20 MED ORDER — FENTANYL CITRATE 0.05 MG/ML IJ SOLN
INTRAMUSCULAR | Status: DC | PRN
  Administered 2012-07-20 (×2): 25 ug via INTRAVENOUS
  Administered 2012-07-20: 100 ug via INTRAVENOUS

## 2012-07-20 MED ORDER — METFORMIN HCL ER 500 MG PO TB24
1000.0000 mg | ORAL_TABLET | Freq: Two times a day (BID) | ORAL | Status: DC
  Administered 2012-07-20: 1000 mg via ORAL
  Filled 2012-07-20 (×4): qty 2

## 2012-07-20 MED ORDER — LIDOCAINE-EPINEPHRINE 1 %-1:100000 IJ SOLN: INTRAMUSCULAR | Status: DC | PRN

## 2012-07-20 MED ORDER — PHENYLEPHRINE HCL 10 MG/ML IJ SOLN
INTRAMUSCULAR | Status: DC | PRN
  Administered 2012-07-20: 80 ug via INTRAVENOUS
  Administered 2012-07-20 (×4): 40 ug via INTRAVENOUS
  Administered 2012-07-20: 80 ug via INTRAVENOUS
  Administered 2012-07-20: 40 ug via INTRAVENOUS

## 2012-07-20 MED ORDER — HYDROCODONE-ACETAMINOPHEN 5-325 MG PO TABS
1.0000 | ORAL_TABLET | ORAL | Status: DC | PRN
  Administered 2012-07-20 – 2012-07-21 (×2): 1 via ORAL
  Filled 2012-07-20 (×3): qty 1

## 2012-07-20 MED ORDER — ONDANSETRON HCL 4 MG/2ML IJ SOLN: 4.0000 mg | Freq: Four times a day (QID) | INTRAMUSCULAR | Status: DC | PRN

## 2012-07-20 MED ORDER — HYDROCODONE-ACETAMINOPHEN 7.5-325 MG PO TABS: 1.0000 | ORAL_TABLET | Freq: Four times a day (QID) | ORAL | Status: DC | PRN

## 2012-07-20 MED ORDER — PROPOFOL 10 MG/ML IV BOLUS
INTRAVENOUS | Status: DC | PRN
  Administered 2012-07-20: 150 mg via INTRAVENOUS

## 2012-07-20 MED ORDER — LIDOCAINE-EPINEPHRINE 1 %-1:100000 IJ SOLN
INTRAMUSCULAR | Status: AC
  Filled 2012-07-20: qty 1

## 2012-07-20 MED ORDER — VITAMIN D (ERGOCALCIFEROL) 1.25 MG (50000 UNIT) PO CAPS: 50000.0000 [IU] | ORAL_CAPSULE | ORAL | Status: DC

## 2012-07-20 MED ORDER — SAXAGLIPTIN-METFORMIN ER 2.5-1000 MG PO TB24: 1.0000 | ORAL_TABLET | Freq: Two times a day (BID) | ORAL | Status: DC

## 2012-07-20 MED ORDER — LIDOCAINE HCL (CARDIAC) 20 MG/ML IV SOLN
INTRAVENOUS | Status: DC | PRN
  Administered 2012-07-20: 50 mg via INTRAVENOUS

## 2012-07-20 MED ORDER — LINAGLIPTIN 5 MG PO TABS
5.0000 mg | ORAL_TABLET | Freq: Every day | ORAL | Status: DC
  Administered 2012-07-20: 5 mg via ORAL
  Filled 2012-07-20 (×2): qty 1

## 2012-07-20 MED ORDER — ASPIRIN 81 MG PO CHEW
81.0000 mg | CHEWABLE_TABLET | Freq: Every day | ORAL | Status: DC
  Administered 2012-07-20: 81 mg via ORAL
  Filled 2012-07-20 (×2): qty 1

## 2012-07-20 MED ORDER — SODIUM CHLORIDE 0.9 % IV SOLN
INTRAVENOUS | Status: DC
  Administered 2012-07-20: 13:00:00 via INTRAVENOUS

## 2012-07-20 MED ORDER — HYDROMORPHONE HCL PF 1 MG/ML IJ SOLN
0.2500 mg | INTRAMUSCULAR | Status: DC | PRN
  Administered 2012-07-20 (×2): 0.5 mg via INTRAVENOUS

## 2012-07-20 MED ORDER — BACITRACIN ZINC 500 UNIT/GM EX OINT
TOPICAL_OINTMENT | CUTANEOUS | Status: AC
  Filled 2012-07-20: qty 15

## 2012-07-20 SURGICAL SUPPLY — 48 items
APPLIER CLIP 9.375 SM OPEN (CLIP)
ATTRACTOMAT 16X20 MAGNETIC DRP (DRAPES) ×2 IMPLANT
CANISTER SUCTION 2500CC (MISCELLANEOUS) ×2 IMPLANT
CLEANER TIP ELECTROSURG 2X2 (MISCELLANEOUS) ×2 IMPLANT
CLIP APPLIE 9.375 SM OPEN (CLIP) IMPLANT
CLOTH BEACON ORANGE TIMEOUT ST (SAFETY) ×2 IMPLANT
CONT SPEC 4OZ CLIKSEAL STRL BL (MISCELLANEOUS) ×4 IMPLANT
CORDS BIPOLAR (ELECTRODE) ×2 IMPLANT
COVER SURGICAL LIGHT HANDLE (MISCELLANEOUS) ×2 IMPLANT
DECANTER SPIKE VIAL GLASS SM (MISCELLANEOUS) ×2 IMPLANT
DERMABOND ADVANCED (GAUZE/BANDAGES/DRESSINGS) ×1
DERMABOND ADVANCED .7 DNX12 (GAUZE/BANDAGES/DRESSINGS) ×1 IMPLANT
DRAIN SNY 10 ROU (WOUND CARE) ×2 IMPLANT
ELECT COATED BLADE 2.86 ST (ELECTRODE) ×2 IMPLANT
ELECT REM PT RETURN 9FT ADLT (ELECTROSURGICAL) ×2
ELECTRODE REM PT RTRN 9FT ADLT (ELECTROSURGICAL) ×1 IMPLANT
EVACUATOR SILICONE 100CC (DRAIN) ×2 IMPLANT
GAUZE SPONGE 4X4 16PLY XRAY LF (GAUZE/BANDAGES/DRESSINGS) ×2 IMPLANT
GLOVE BIO SURGEON STRL SZ 6.5 (GLOVE) ×2 IMPLANT
GLOVE BIOGEL PI IND STRL 7.0 (GLOVE) ×1 IMPLANT
GLOVE BIOGEL PI INDICATOR 7.0 (GLOVE) ×1
GLOVE ECLIPSE 7.5 STRL STRAW (GLOVE) ×2 IMPLANT
GLOVE SURG SS PI 6.5 STRL IVOR (GLOVE) ×2 IMPLANT
GLOVE SURG SS PI 7.0 STRL IVOR (GLOVE) ×2 IMPLANT
GOWN PREVENTION PLUS LG XLONG (DISPOSABLE) ×2 IMPLANT
GOWN STRL NON-REIN LRG LVL3 (GOWN DISPOSABLE) ×4 IMPLANT
KIT BASIN OR (CUSTOM PROCEDURE TRAY) ×2 IMPLANT
KIT ROOM TURNOVER OR (KITS) ×2 IMPLANT
NEEDLE 27GAX1X1/2 (NEEDLE) ×2 IMPLANT
NS IRRIG 1000ML POUR BTL (IV SOLUTION) ×2 IMPLANT
PAD ARMBOARD 7.5X6 YLW CONV (MISCELLANEOUS) ×4 IMPLANT
PENCIL FOOT CONTROL (ELECTRODE) ×2 IMPLANT
SPECIMEN JAR MEDIUM (MISCELLANEOUS) IMPLANT
SPONGE INTESTINAL PEANUT (DISPOSABLE) ×2 IMPLANT
STAPLER VISISTAT 35W (STAPLE) ×2 IMPLANT
SUT CHROMIC 3 0 SH 27 (SUTURE) IMPLANT
SUT CHROMIC 4 0 PS 2 18 (SUTURE) ×4 IMPLANT
SUT ETHILON 3 0 PS 1 (SUTURE) ×2 IMPLANT
SUT ETHILON 5 0 P 3 18 (SUTURE)
SUT NYLON ETHILON 5-0 P-3 1X18 (SUTURE) IMPLANT
SUT SILK 3 0 REEL (SUTURE) IMPLANT
SUT SILK 4 0 REEL (SUTURE) ×2 IMPLANT
TAPE CLOTH 4X10 WHT NS (GAUZE/BANDAGES/DRESSINGS) ×2 IMPLANT
TAPE HY-TAPE 1X5Y PINK NS LF (GAUZE/BANDAGES/DRESSINGS) ×2 IMPLANT
TOWEL OR 17X24 6PK STRL BLUE (TOWEL DISPOSABLE) ×2 IMPLANT
TOWEL OR 17X26 10 PK STRL BLUE (TOWEL DISPOSABLE) ×2 IMPLANT
TRAY ENT MC OR (CUSTOM PROCEDURE TRAY) ×2 IMPLANT
WATER STERILE IRR 1000ML POUR (IV SOLUTION) ×2 IMPLANT

## 2012-07-20 NOTE — Transfer of Care (Signed)
Immediate Anesthesia Transfer of Care Note  Patient: Michelle Cummings  Procedure(s) Performed: Procedure(s): RIGHT THYROID LOBECTOMY WITH ISTHMUSECTOMY (Right)  Patient Location: PACU  Anesthesia Type:General  Level of Consciousness: awake, alert  and oriented  Airway & Oxygen Therapy: Patient Spontanous Breathing and Patient connected to nasal cannula oxygen  Post-op Assessment: Report given to PACU RN, Post -op Vital signs reviewed and stable and Patient moving all extremities X 4  Post vital signs: Reviewed and stable  Complications: No apparent anesthesia complications

## 2012-07-20 NOTE — OR Nursing (Signed)
Two specimens tubed to pathology - "Right Thyroid Lobe" and "Lower Pre-Tracheal Component of Isthmus." Called pathology to notify them that the specimens had been tubed at 10:29. Pathology called to confirm they had been received at 10:34. Pathology called with results at 10:57.  Narda Rutherford, RN

## 2012-07-20 NOTE — Progress Notes (Signed)
07/20/2012 9:43 PM  Hiram Comber [id number]  Post  op check    Temp:  [97.3 F (36.3 C)-97.9 F (36.6 C)] 97.8 F (36.6 C) (07/16 1739) Pulse Rate:  [57-68] 65 (07/16 1739) Resp:  [14-20] 16 (07/16 1739) BP: (166-209)/(65-86) 188/71 mmHg (07/16 1739) SpO2:  [95 %-100 %] 100 % (07/16 1739) Weight:  [82 kg (180 lb 12.4 oz)] 82 kg (180 lb 12.4 oz) (07/16 1308),     Intake/Output Summary (Last 24 hours) at 07/20/12 2143 Last data filed at 07/20/12 1700  Gross per 24 hour  Intake   1670 ml  Output     30 ml  Net   1640 ml   JP:  20 ml  Results for orders placed during the hospital encounter of 07/20/12 (from the past 24 hour(s))  GLUCOSE, CAPILLARY     Status: Abnormal   Collection Time    07/20/12  7:35 AM      Result Value Range   Glucose-Capillary 182 (*) 70 - 99 mg/dL  POCT I-STAT 4, (NA,K, GLUC, HGB,HCT)     Status: Abnormal   Collection Time    07/20/12  8:21 AM      Result Value Range   Sodium 139  135 - 145 mEq/L   Potassium 4.5  3.5 - 5.1 mEq/L   Glucose, Bld 182 (*) 70 - 99 mg/dL   HCT 36.0  36.0 - 46.0 %   Hemoglobin 12.2  12.0 - 15.0 g/dL  GLUCOSE, CAPILLARY     Status: Abnormal   Collection Time    07/20/12 11:18 AM      Result Value Range   Glucose-Capillary 145 (*) 70 - 99 mg/dL   Comment 1 Notify RN    GLUCOSE, CAPILLARY     Status: Abnormal   Collection Time    07/20/12  2:52 PM      Result Value Range   Glucose-Capillary 148 (*) 70 - 99 mg/dL  GLUCOSE, CAPILLARY     Status: Abnormal   Collection Time    07/20/12  5:08 PM      Result Value Range   Glucose-Capillary 109 (*) 70 - 99 mg/dL    SUBJECTIVE:  Min pain.  Breathing OK.  Swallowing OK.  Ambulatory.  Voiding without difficulty.  OBJECTIVE:  Voice clear.  Neck flat.  Drain functioning.    IMPRESSION:  Satisfactory check.  Hypertension likely pain related.  Mild hyperglycemia acceptable  PLAN:  Analgesics.  Anticipate drain, out, discharge home in AM.  [place], [place]

## 2012-07-20 NOTE — Interval H&P Note (Signed)
History and Physical Interval Note:  07/20/2012 7:47 AM  [name]  has presented today for surgery, with the diagnosis of THYROID NODULE  The various methods of treatment have been discussed with the patient and family. After consideration of risks, benefits and other options for treatment, the patient has consented to  Procedure(s) with comments: THYROIDECTOMY (Right) - Right thryroid lobectomy with partial left thyroid lobectomy.  Frozen section and possible total. as a surgical intervention .  The patient's history has been reviewed, patient examined, no change in status, stable for surgery.  I have reviewed the patient's chart and labs.  Questions were answered to the patient's satisfaction.     Michelle Cummings

## 2012-07-20 NOTE — Anesthesia Procedure Notes (Signed)
Procedure Name: Intubation Date/Time: 07/20/2012 9:18 AM Performed by: Neldon Newport Pre-anesthesia Checklist: Timeout performed, Patient identified, Emergency Drugs available, Suction available and Patient being monitored Patient Re-evaluated:Patient Re-evaluated prior to inductionOxygen Delivery Method: Circle system utilized Preoxygenation: Pre-oxygenation with 100% oxygen Intubation Type: IV induction Ventilation: Mask ventilation without difficulty Laryngoscope Size: Mac and 3 Grade View: Grade II Tube type: Oral Tube size: 7.0 mm Number of attempts: 1 Placement Confirmation: ETT inserted through vocal cords under direct vision,  positive ETCO2 and breath sounds checked- equal and bilateral Secured at: 22 cm Tube secured with: Tape Dental Injury: Teeth and Oropharynx as per pre-operative assessment

## 2012-07-20 NOTE — Progress Notes (Signed)
Dr. Cherene Altes notified of repeat BP

## 2012-07-20 NOTE — Op Note (Signed)
OPERATIVE REPORT  DATE OF SURGERY: 07/20/2012  PATIENT:  Michelle Cummings,  62 y.o. female  PRE-OPERATIVE DIAGNOSIS:  THYROID NODULE  POST-OPERATIVE DIAGNOSIS:  FOLLICULAR LESION NODULE  PROCEDURE:  Procedure(s): RIGHT THYROID LOBECTOMY WITH ISTHMUSECTOMY  SURGEON:  Beckie Salts, MD  ASSISTANTS: Jolene Provost, PA  ANESTHESIA:   General   EBL:  25 ml  DRAINS: 10 French round  LOCAL MEDICATIONS USED:  None  SPECIMEN:  Right thyroid lobe, frozen section follicular lesion. Lower isthmus mass, benign tissue.  COUNTS:  Correct  PROCEDURE DETAILS: The patient was taken to the operating room and placed on the operating table in the supine position. A shoulder roll was placed beneath the shoulder blades and the neck was extended. The neck was prepped and draped in a standard fashion. A low  transverse incision was outlined with a marking pen and was incised with electrocautery. Dissection was continued down through the platysma layer. Subplatysmal flaps were elevated superiorly to the thyroid cartilage and inferiorly to the clavicle. Self-retaining thyroid retractor was used throughout the case.  The midline fascia was divided. The strap muscles were dissected off of the right lobe of the thyroid. The right lobe was reflected medially and inferiorly as the superior pole vessels were identified separately, ligated between clamps and divided. 4-0 silk ties were used throughout. The middle thyroid vein was ligated and divided as well. As the gland was brought forward and inferiorly recurrent nerve was identified and was preserved. The lower vasculature was identified, ligated and divided. Parathyroids were not identified as the dissection remained on the capsule of the gland. The gland was brought forward off the trachea. The isthmus was divided. The right lobe was sent for pathologic evaluation. Just below the isthmus was a secondary mass that was felt to be where the previous fine-needle  aspiration was performed. It was large and multilobulated, conglomerate of approximately 6-7 cm in diameter. This was dissected out of surrounding tissue. One vessel was ligated and divided. This mass was sent separately and frozen section on this revealed benign thyroid tissue. Frozen section on the right lobe revealed a follicular lesion. No evidence of papillary cancer. The wound was irrigated with saline. Hemostasis was completed. The midline fascia was reapproximated with chromic suture. The platysma layer was also reapproximated in a similar fashion. The drain was left in the wound and exited through the left-side of the incision and secured in place with nylon suture. A subcuticular closure was performed with chromic suture and Dermabond was used on the skin. The patient was awakened, extubated and transferred to recovery in stable condition.   PATIENT DISPOSITION:  To PACU, stable

## 2012-07-20 NOTE — Anesthesia Preprocedure Evaluation (Signed)
Anesthesia Evaluation  Patient identified by MRN, date of birth, ID band Patient awake    Reviewed: Allergy & Precautions, H&P , NPO status , Patient's Chart, lab work & pertinent test results  Airway Mallampati: II  Neck ROM: full    Dental   Pulmonary          Cardiovascular hypertension,     Neuro/Psych    GI/Hepatic   Endo/Other  diabetes, Type 2  Renal/GU      Musculoskeletal  (+) Arthritis -,   Abdominal   Peds  Hematology   Anesthesia Other Findings   Reproductive/Obstetrics                           Anesthesia Physical Anesthesia Plan  ASA: II  Anesthesia Plan: General   Post-op Pain Management:    Induction: Intravenous  Airway Management Planned: Oral ETT  Additional Equipment:   Intra-op Plan:   Post-operative Plan: Extubation in OR  Informed Consent: I have reviewed the patients History and Physical, chart, labs and discussed the procedure including the risks, benefits and alternatives for the proposed anesthesia with the patient or authorized representative who has indicated his/her understanding and acceptance.     Plan Discussed with: CRNA, Anesthesiologist and Surgeon  Anesthesia Plan Comments:         Anesthesia Quick Evaluation

## 2012-07-20 NOTE — Anesthesia Postprocedure Evaluation (Signed)
Anesthesia Post Note  Patient: Michelle Cummings  Procedure(s) Performed: Procedure(s) (LRB): RIGHT THYROID LOBECTOMY WITH ISTHMUSECTOMY (Right)  Anesthesia type: General  Patient location: PACU  Post pain: Pain level controlled and Adequate analgesia  Post assessment: Post-op Vital signs reviewed, Patient's Cardiovascular Status Stable, Respiratory Function Stable, Patent Airway and Pain level controlled  Last Vitals:  Filed Vitals:   07/20/12 1124  BP: 196/70  Pulse:   Temp: 36.3 C  Resp:     Post vital signs: Reviewed and stable  Level of consciousness: awake, alert  and oriented  Complications: No apparent anesthesia complications

## 2012-07-20 NOTE — Preoperative (Signed)
Beta Blockers   Reason not to administer Beta Blockers:Not Applicable 

## 2012-07-20 NOTE — Progress Notes (Signed)
Notified Dr. Cherene Altes of BP will monitor for an additional 15 minutes obtain I-Stat. And call back if BP is no better

## 2012-07-21 LAB — GLUCOSE, CAPILLARY: Glucose-Capillary: 123 mg/dL — ABNORMAL HIGH (ref 70–99)

## 2012-07-21 NOTE — Progress Notes (Signed)
Patient discharged to home with instructions, verbalized understanding. 

## 2012-07-21 NOTE — Discharge Summary (Signed)
Physician Discharge Summary  Patient ID: Michelle Cummings MRN: [id number] DOB/AGE: 05/29/1950 62 y.o.  Admit date: 07/20/2012 Discharge date: 07/21/2012  Admission Diagnoses:Thyroid mass  Discharge Diagnoses:  Active Problems:   * No active hospital problems. *   Discharged Condition: good  Hospital Course: no complications  Consults: none  Significant Diagnostic Studies: none  Treatments: surgery: Thyroid lobectomy  Discharge Exam: Blood pressure 145/52, pulse 67, temperature 98.1 F (36.7 C), temperature source Oral, resp. rate 16, height 5\' 4"  (1.626 m), weight 180 lb 12.4 oz (82 kg), SpO2 96.00%. PHYSICAL EXAM: Normal voice, incision excellent, JP removed.  Disposition: 01-Home or Self Care  Discharge Orders   Future Orders Complete By Expires     Diet - low sodium heart healthy  As directed     Increase activity slowly  As directed         Medication List         aspirin 81 MG tablet  Take 81 mg by mouth daily.     bimatoprost 0.03 % ophthalmic solution  Commonly known as:  LUMIGAN  Place 1 drop into both eyes at bedtime.     carvedilol 6.25 MG tablet  Commonly known as:  COREG  Take 6.25 mg by mouth 2 (two) times daily with a meal.     ergocalciferol 50000 UNITS capsule  Commonly known as:  VITAMIN D2  Take 50,000 Units by mouth See admin instructions. Twice a week on Tues and Friday; Finished yesterday     furosemide 20 MG tablet  Commonly known as:  LASIX  Take 20 mg by mouth daily.     HYDROcodone-acetaminophen 7.5-325 MG per tablet  Commonly known as:  NORCO  Take 1 tablet by mouth every 6 (six) hours as needed for pain.     Iron 66 MG Tabs  Take 1 tablet by mouth daily.     KOMBIGLYZE XR 2.05-998 MG Tb24  Generic drug:  Saxagliptin-Metformin  Take 1 tablet by mouth 2 (two) times daily.     losartan 100 MG tablet  Commonly known as:  COZAAR  Take 100 mg by mouth daily.     promethazine 25 MG suppository  Commonly known as:  PHENERGAN   Place 1 suppository (25 mg total) rectally every 6 (six) hours as needed for nausea.     rosuvastatin 10 MG tablet  Commonly known as:  CRESTOR  Take 10 mg by mouth daily.     timolol 0.5 % ophthalmic gel-forming  Commonly known as:  TIMOPTIC-XR  Place 1 drop into both eyes daily.     Vitamin D 1000 UNITS capsule  Take 1,000 Units by mouth 2 (two) times daily.           Follow-up Information   Follow up with Izora Gala, MD. Schedule an appointment as soon as possible for a visit in 1 week.   Contact information:   [address] [place], [place] [address] [phone]       Signed: Izora Gala 07/21/2012, 8:34 AM

## 2012-07-22 ENCOUNTER — Encounter (HOSPITAL_COMMUNITY): Payer: Self-pay | Admitting: Otolaryngology

## 2012-11-10 ENCOUNTER — Other Ambulatory Visit: Payer: Self-pay

## 2013-03-27 ENCOUNTER — Other Ambulatory Visit: Payer: Self-pay

## 2013-03-27 DIAGNOSIS — Z1231 Encounter for screening mammogram for malignant neoplasm of breast: Secondary | ICD-10-CM

## 2013-04-11 ENCOUNTER — Ambulatory Visit: Payer: Self-pay

## 2013-04-17 ENCOUNTER — Ambulatory Visit: Payer: Self-pay

## 2013-09-19 ENCOUNTER — Ambulatory Visit
Admission: RE | Admit: 2013-09-19 | Discharge: 2013-09-19 | Disposition: A | Discharge status: Home / Self Care | Payer: BC Managed Care – PPO | Source: Ambulatory Visit

## 2013-09-19 DIAGNOSIS — Z1231 Encounter for screening mammogram for malignant neoplasm of breast: Secondary | ICD-10-CM

## 2013-11-06 ENCOUNTER — Encounter (HOSPITAL_COMMUNITY): Payer: Self-pay | Admitting: Otolaryngology

## 2014-01-07 IMAGING — CR DG ANKLE COMPLETE 3+V*L*
4 series · 4 of 4 positions shown · non-contrast
Comparison: None

CLINICAL DATA: Twist injury, fall, unable to bear weight, soft
tissue swelling

LEFT ANKLE COMPLETE - 3+ VIEW

[view not recorded (1 of 4)]
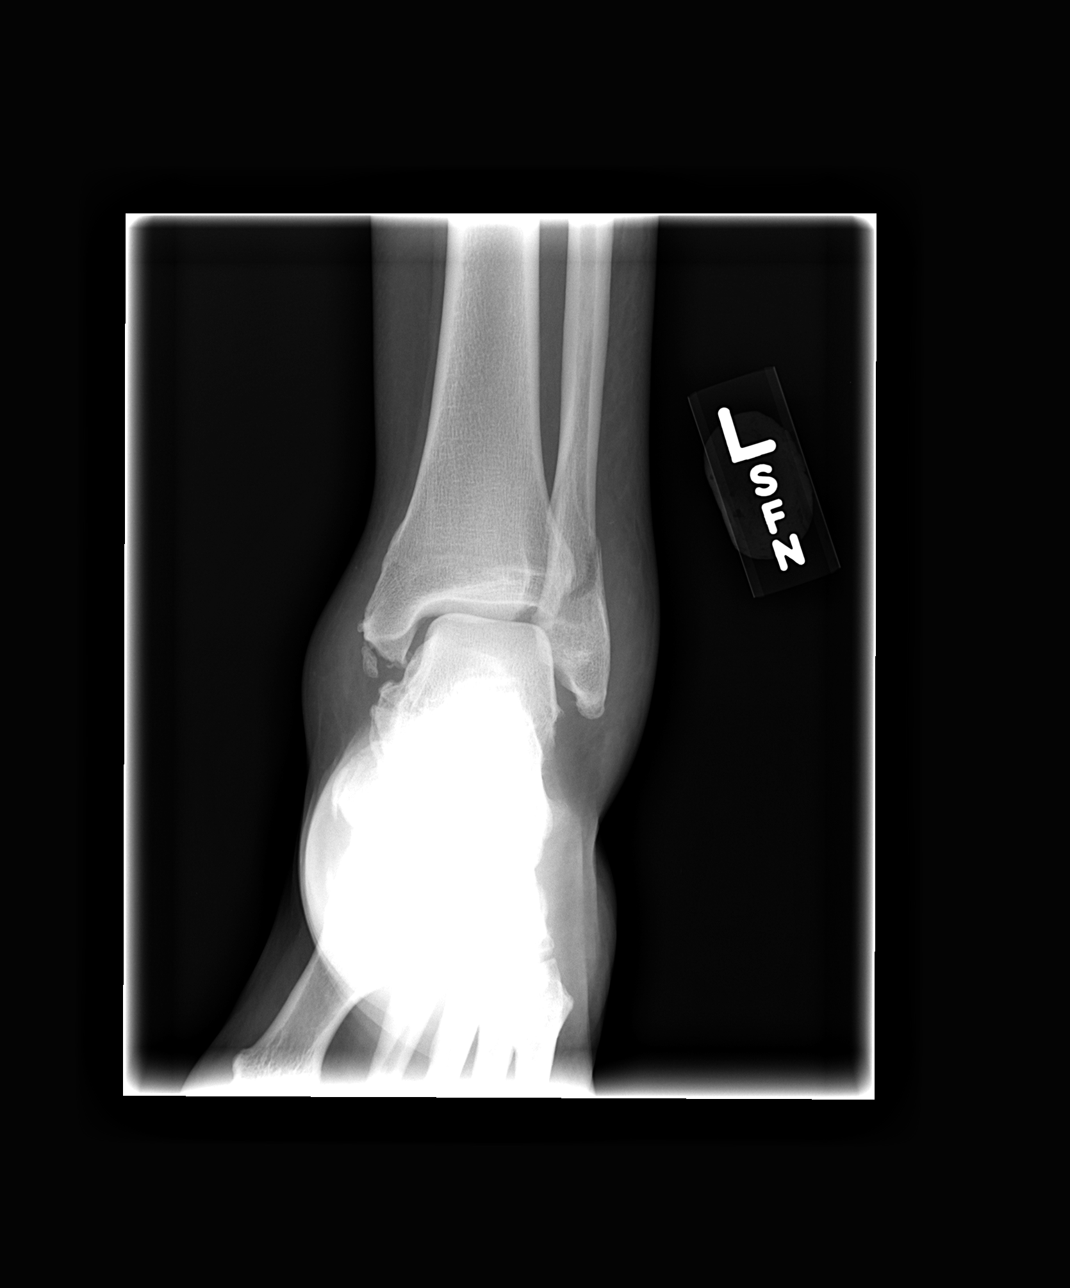

[view not recorded (2 of 4)]
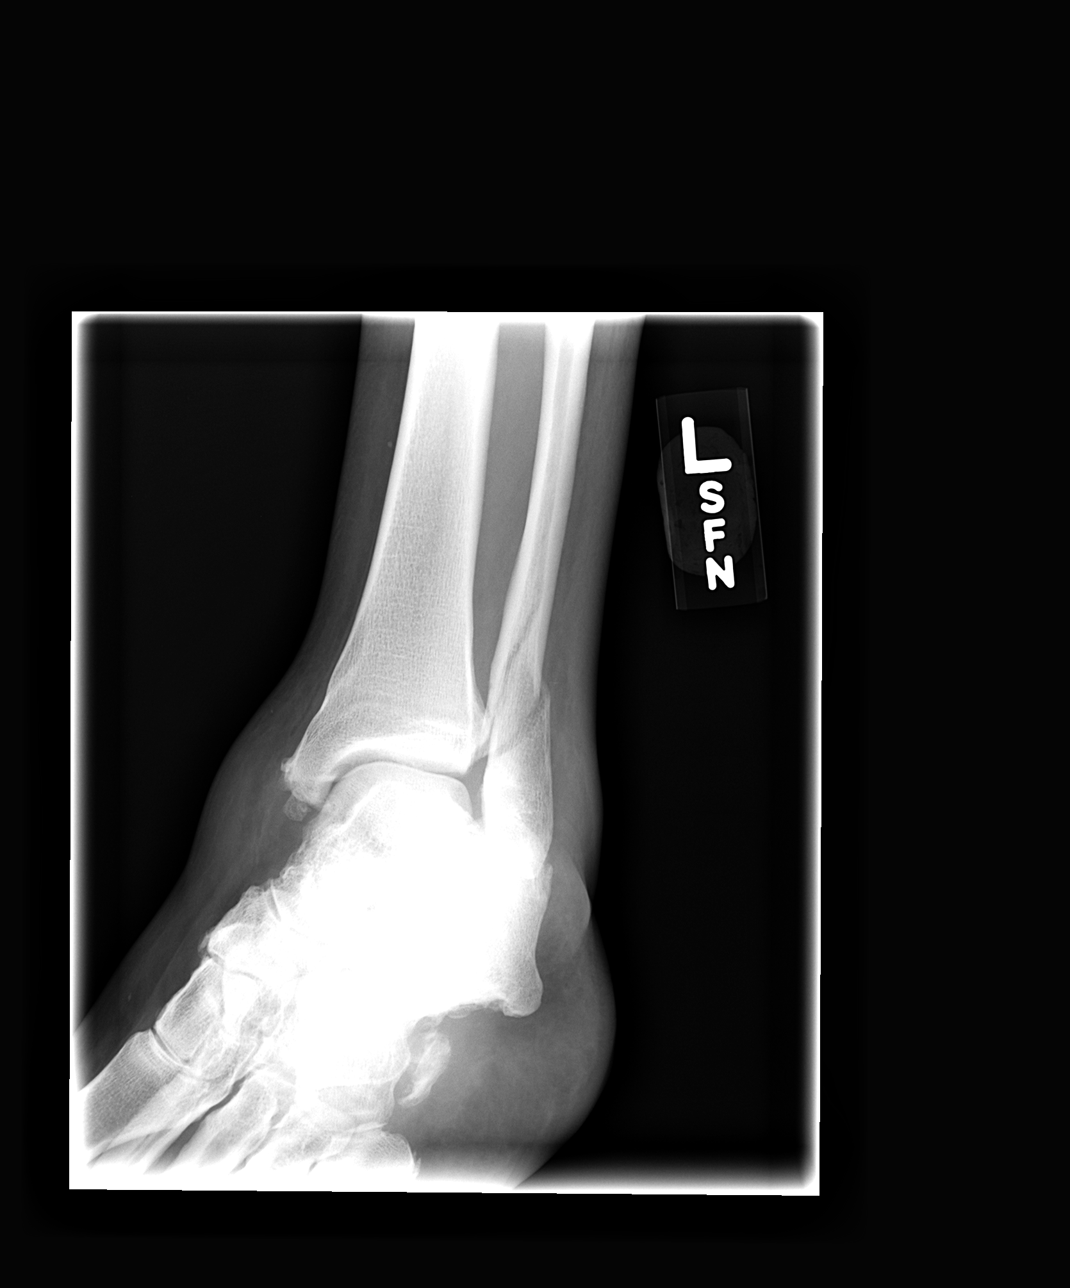

[view not recorded (3 of 4)]
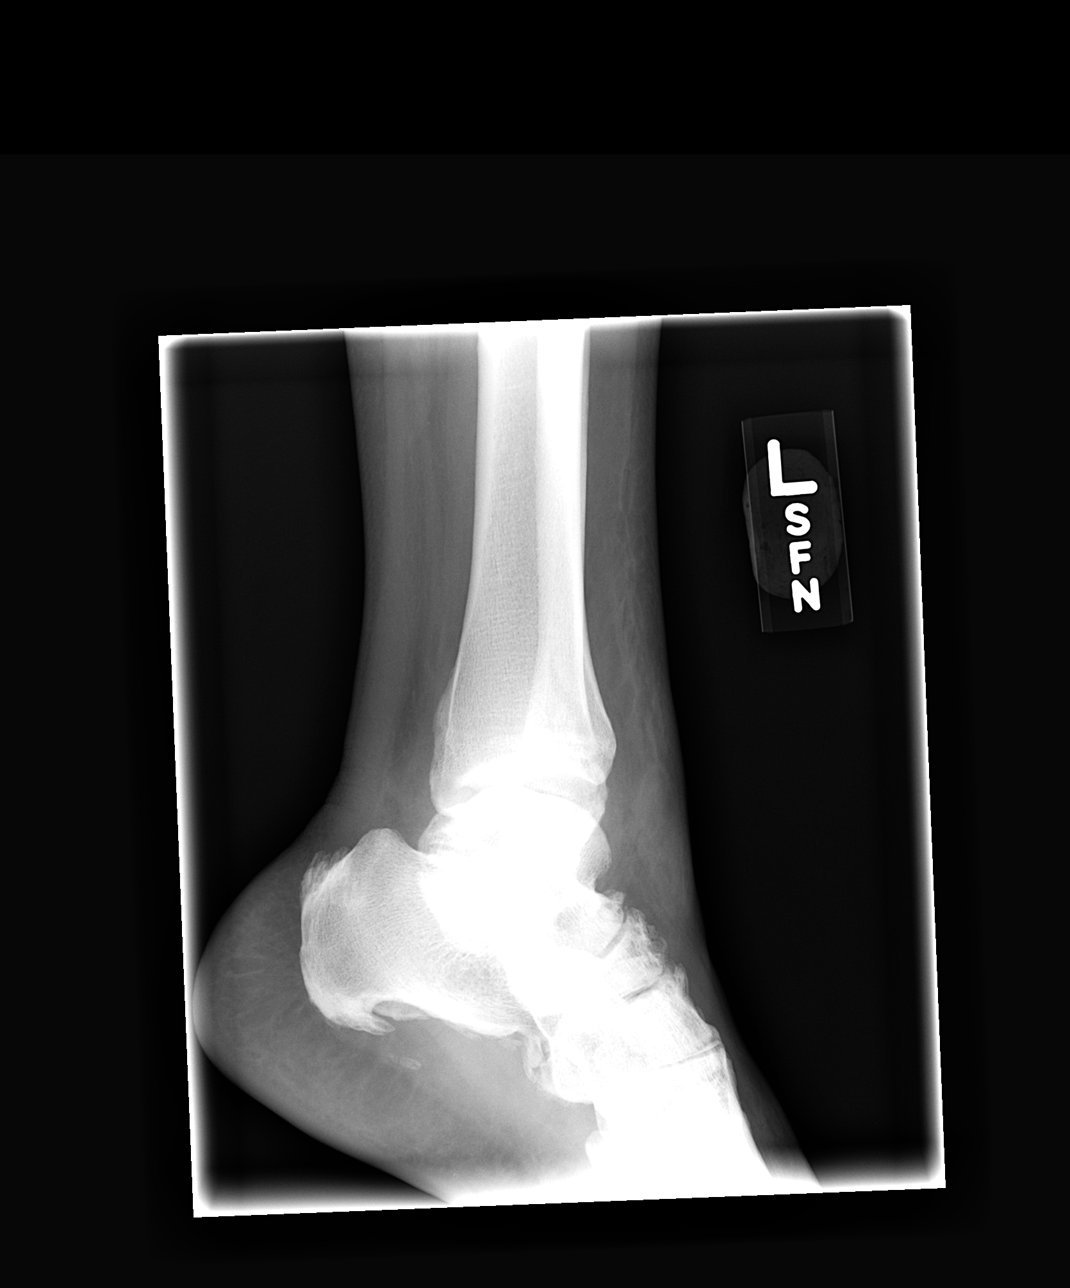

[view not recorded (4 of 4)]
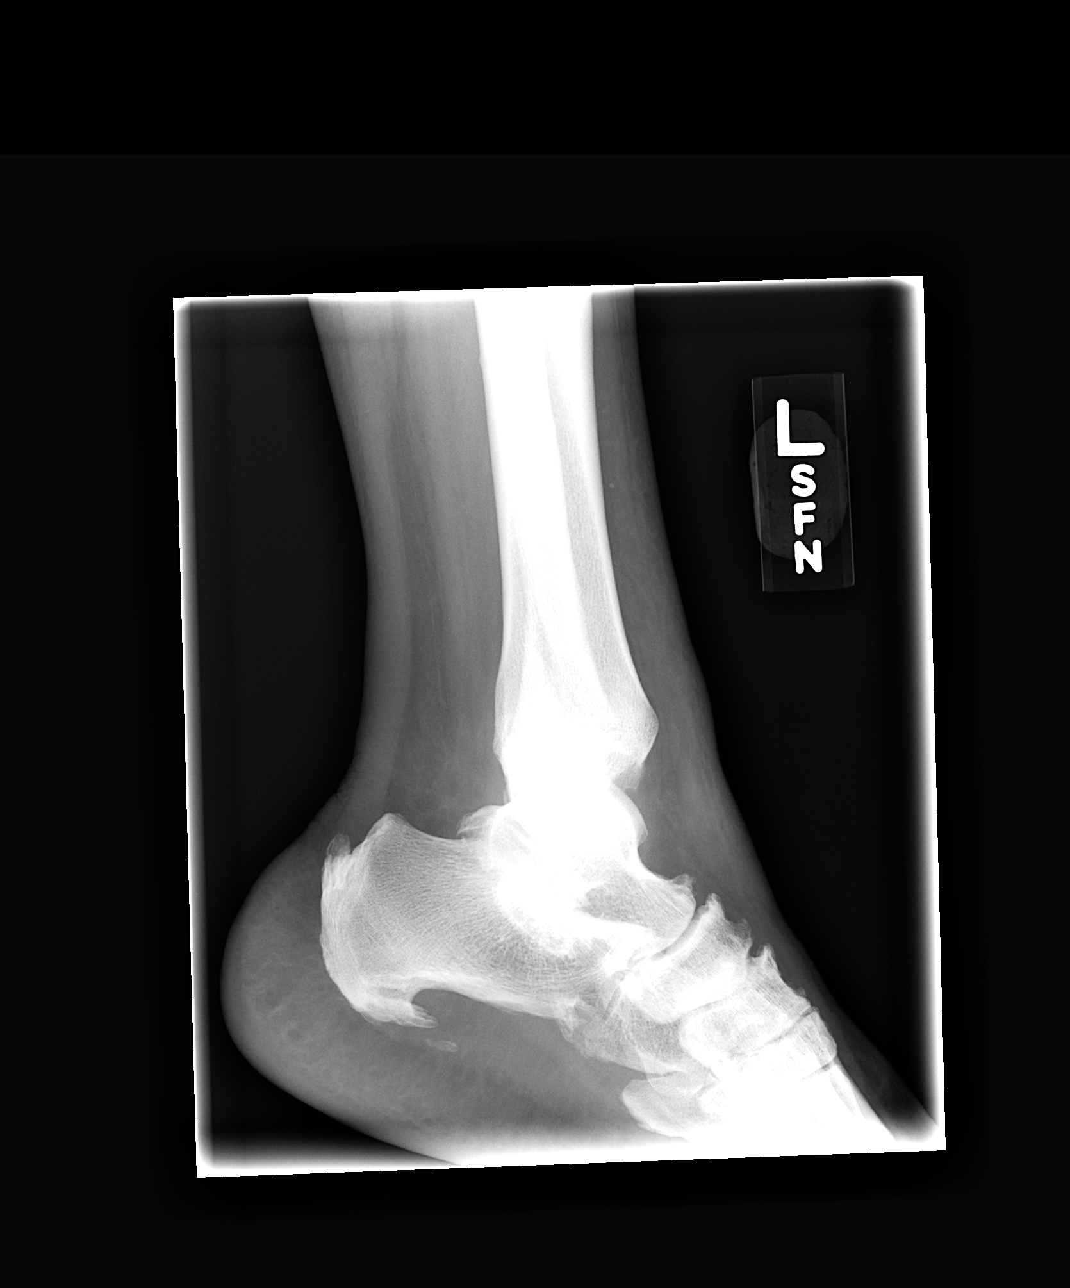

[4 of 4 positions shown; findings below may reference images not displayed]

FINDINGS: Osseous mineralization normal.
Oblique displaced distal fibular fracture.
Slight widening of the anterior ankle joint on the lateral view.
Plantar and Achilles insertion calcaneal spurs.
Nonfused ossicle at medial malleolus appears corticated and old.
Diffuse soft tissue swelling.
No definite additional fracture, dislocation or bone destruction.
Large calcification at the lateral proximal to mid foot likely an
unusually large accessory ossification center.
Scattered intertarsal degenerative changes.
IMPRESSION: Displaced oblique distal fibular fracture.
Mild widening of the anterior ankle joint on lateral view could
reflect occult ligamentous injury.
Significant calcaneal spurring and intertarsal degenerative
changes.

## 2014-07-30 LAB — HM COLONOSCOPY

## 2014-09-25 IMAGING — CR DG CHEST 2V
2 series · 2 of 2 positions shown · non-contrast
Comparison: Chest radiographs 03/05/2011.

CLINICAL DATA: Preoperative respiratory examination for
thyroidectomy.

CHEST - 2 VIEW

[w chest pa]
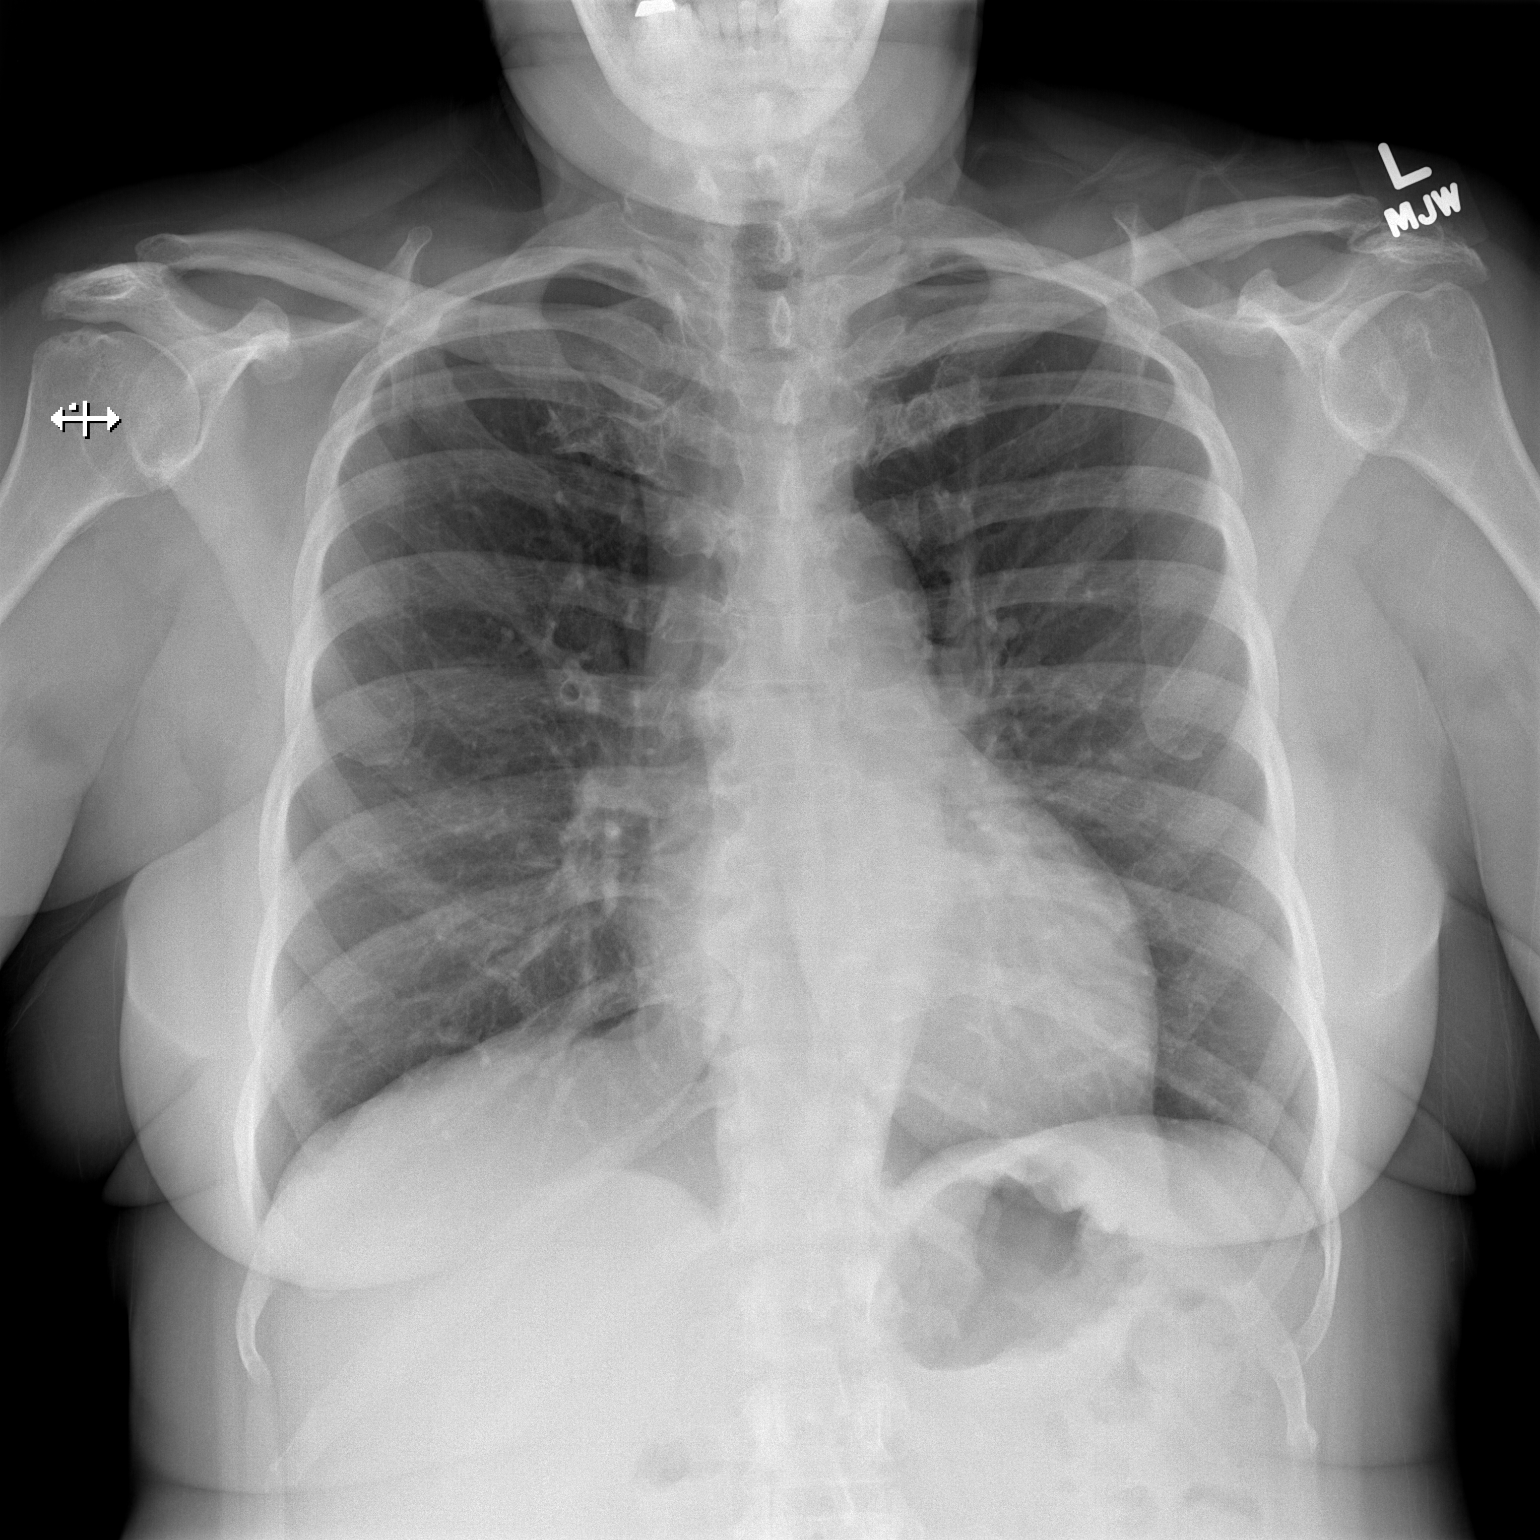

[w chest lat]
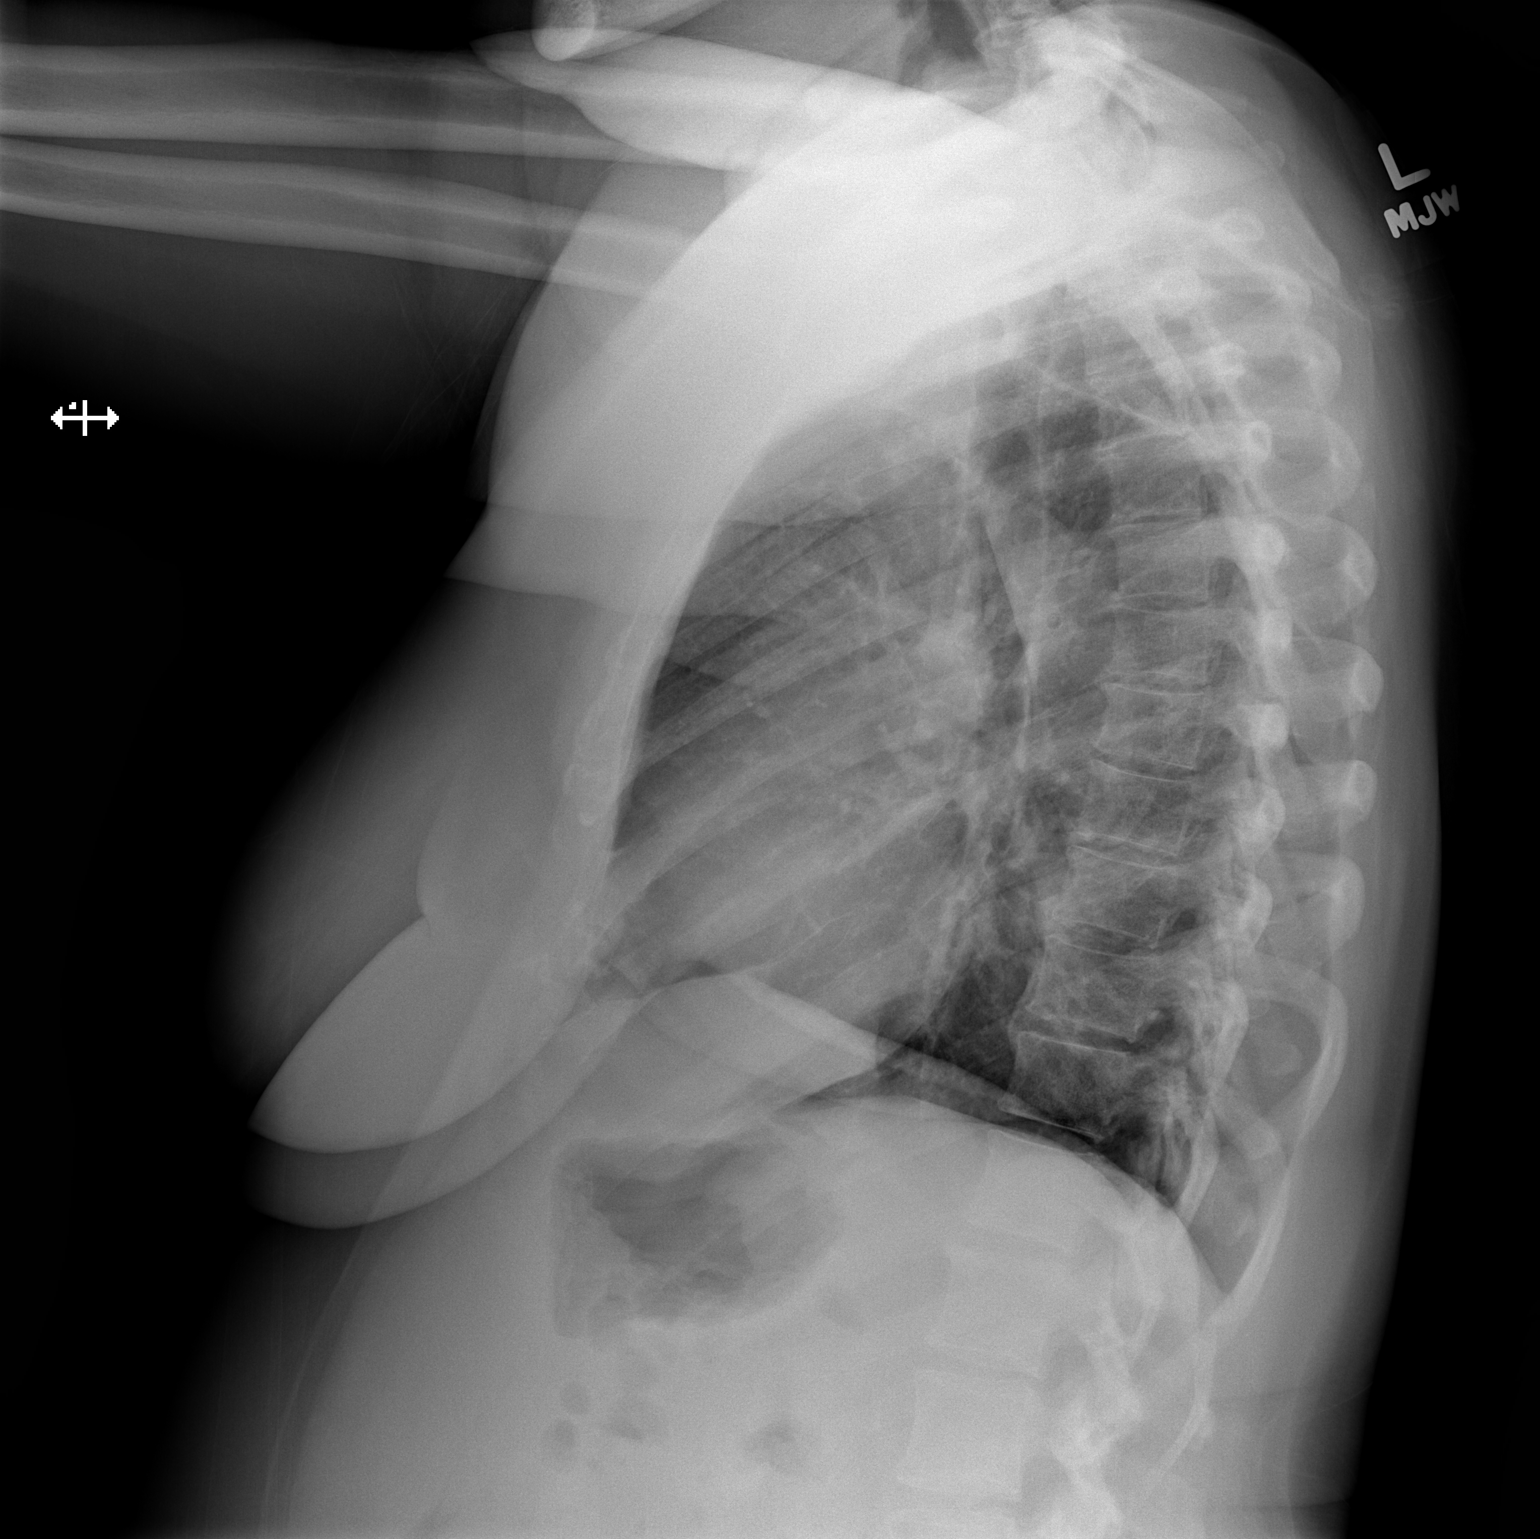

[2 of 2 positions shown; findings below may reference images not displayed]

FINDINGS: There is a slightly improved degree of inspiration. The
heart size and mediastinal contours are normal. The lungs are
clear. There is no pleural effusion or pneumothorax. No acute
osseous findings are identified.  Thoracic spine paraspinal
osteophytes are noted.
IMPRESSION: No active cardiopulmonary process.

## 2014-11-05 ENCOUNTER — Other Ambulatory Visit: Payer: Self-pay

## 2014-11-05 DIAGNOSIS — Z1231 Encounter for screening mammogram for malignant neoplasm of breast: Secondary | ICD-10-CM

## 2014-11-27 ENCOUNTER — Ambulatory Visit
Admission: RE | Admit: 2014-11-27 | Discharge: 2014-11-27 | Disposition: A | Discharge status: Home / Self Care | Payer: Medicare Other | Source: Ambulatory Visit

## 2014-11-27 DIAGNOSIS — Z1231 Encounter for screening mammogram for malignant neoplasm of breast: Secondary | ICD-10-CM

## 2015-11-14 ENCOUNTER — Other Ambulatory Visit: Payer: Self-pay | Admitting: Internal Medicine

## 2015-11-14 DIAGNOSIS — Z1231 Encounter for screening mammogram for malignant neoplasm of breast: Secondary | ICD-10-CM

## 2015-12-03 ENCOUNTER — Ambulatory Visit: Payer: Self-pay

## 2015-12-11 ENCOUNTER — Ambulatory Visit
Admission: RE | Admit: 2015-12-11 | Discharge: 2015-12-11 | Disposition: A | Discharge status: Home / Self Care | Payer: Medicare Other | Source: Ambulatory Visit | Attending: Internal Medicine | Admitting: Internal Medicine

## 2015-12-11 DIAGNOSIS — Z1231 Encounter for screening mammogram for malignant neoplasm of breast: Secondary | ICD-10-CM

## 2016-06-02 ENCOUNTER — Other Ambulatory Visit: Payer: Self-pay | Admitting: Family Medicine

## 2016-06-02 ENCOUNTER — Ambulatory Visit (INDEPENDENT_AMBULATORY_CARE_PROVIDER_SITE_OTHER): Payer: Medicare Other | Admitting: Family Medicine

## 2016-06-02 ENCOUNTER — Encounter: Payer: Self-pay | Admitting: Family Medicine

## 2016-06-02 VITALS — BP 138/72 | HR 74 | Temp 97.7°F | Ht 64.0 in | Wt 168.0 lb

## 2016-06-02 DIAGNOSIS — Z862 Personal history of diseases of the blood and blood-forming organs and certain disorders involving the immune mechanism: Secondary | ICD-10-CM

## 2016-06-02 DIAGNOSIS — N183 Chronic kidney disease, stage 3 unspecified: Secondary | ICD-10-CM | POA: Insufficient documentation

## 2016-06-02 DIAGNOSIS — H409 Unspecified glaucoma: Secondary | ICD-10-CM | POA: Insufficient documentation

## 2016-06-02 DIAGNOSIS — N189 Chronic kidney disease, unspecified: Secondary | ICD-10-CM

## 2016-06-02 DIAGNOSIS — Z79899 Other long term (current) drug therapy: Secondary | ICD-10-CM | POA: Diagnosis not present

## 2016-06-02 DIAGNOSIS — E2839 Other primary ovarian failure: Secondary | ICD-10-CM

## 2016-06-02 DIAGNOSIS — E7800 Pure hypercholesterolemia, unspecified: Secondary | ICD-10-CM

## 2016-06-02 DIAGNOSIS — E119 Type 2 diabetes mellitus without complications: Secondary | ICD-10-CM

## 2016-06-02 DIAGNOSIS — I10 Essential (primary) hypertension: Secondary | ICD-10-CM | POA: Diagnosis not present

## 2016-06-02 DIAGNOSIS — E041 Nontoxic single thyroid nodule: Secondary | ICD-10-CM | POA: Diagnosis not present

## 2016-06-02 DIAGNOSIS — E785 Hyperlipidemia, unspecified: Secondary | ICD-10-CM | POA: Insufficient documentation

## 2016-06-02 DIAGNOSIS — R7989 Other specified abnormal findings of blood chemistry: Secondary | ICD-10-CM

## 2016-06-02 DIAGNOSIS — E559 Vitamin D deficiency, unspecified: Secondary | ICD-10-CM

## 2016-06-02 DIAGNOSIS — Z9189 Other specified personal risk factors, not elsewhere classified: Secondary | ICD-10-CM

## 2016-06-02 DIAGNOSIS — Z1159 Encounter for screening for other viral diseases: Secondary | ICD-10-CM | POA: Diagnosis not present

## 2016-06-02 LAB — TSH: TSH: 3.04 u[IU]/mL (ref 0.35–4.50)

## 2016-06-02 LAB — LIPID PANEL
Cholesterol: 211 mg/dL — ABNORMAL HIGH (ref 0–200)
HDL: 58.1 mg/dL (ref >39.00)
LDL Cholesterol: 122 mg/dL — ABNORMAL HIGH (ref 0–99)
NonHDL: 153.03
Total CHOL/HDL Ratio: 4
Triglycerides: 153 mg/dL — ABNORMAL HIGH (ref 0.0–149.0)
VLDL: 30.6 mg/dL (ref 0.0–40.0)

## 2016-06-02 LAB — COMPREHENSIVE METABOLIC PANEL
ALT: 16 U/L (ref 0–35)
AST: 15 U/L (ref 0–37)
Albumin: 4.2 g/dL (ref 3.5–5.2)
Alkaline Phosphatase: 67 U/L (ref 39–117)
BUN: 44 mg/dL — ABNORMAL HIGH (ref 6–23)
CO2: 22 mEq/L (ref 19–32)
Calcium: 9.7 mg/dL (ref 8.4–10.5)
Chloride: 106 mEq/L (ref 96–112)
Creatinine, Ser: 1.43 mg/dL — ABNORMAL HIGH (ref 0.40–1.20)
GFR: 47.02 mL/min — ABNORMAL LOW (ref >60.00)
Glucose, Bld: 155 mg/dL — ABNORMAL HIGH (ref 70–99)
Potassium: 4.8 mEq/L (ref 3.5–5.1)
Sodium: 135 mEq/L (ref 135–145)
Total Bilirubin: 0.2 mg/dL (ref 0.2–1.2)
Total Protein: 7.4 g/dL (ref 6.0–8.3)

## 2016-06-02 LAB — HEPATITIS C ANTIBODY: HCV Ab: NEGATIVE

## 2016-06-02 LAB — CBC
HCT: 32.5 % — ABNORMAL LOW (ref 36.0–46.0)
Hemoglobin: 10.7 g/dL — ABNORMAL LOW (ref 12.0–15.0)
MCHC: 32.7 g/dL (ref 30.0–36.0)
MCV: 84.4 fl (ref 78.0–100.0)
Platelets: 290 10*3/uL (ref 150.0–400.0)
RBC: 3.86 Mil/uL — ABNORMAL LOW (ref 3.87–5.11)
RDW: 13.6 % (ref 11.5–15.5)
WBC: 5.9 10*3/uL (ref 4.0–10.5)

## 2016-06-02 LAB — VITAMIN B12: Vitamin B-12: 415 pg/mL (ref 211–911)

## 2016-06-02 LAB — VITAMIN D 25 HYDROXY (VIT D DEFICIENCY, FRACTURES): VITD: 45.56 ng/mL (ref 30.00–100.00)

## 2016-06-02 LAB — POCT GLYCOSYLATED HEMOGLOBIN (HGB A1C): Hemoglobin A1C: 6.3

## 2016-06-02 NOTE — Progress Notes (Signed)
Michelle Cummings is a 66 y.o. female is here to [facility].   Patient Care Team: Briscoe Deutscher, DO as PCP - General (Family Medicine)   History of Present Illness:   Shaune Pascal CMA acting as scribe for Dr. Juleen China.  HPI Patient comes in today to establish care. She has no concerns today.   1. Hypertension. Home blood pressure readings at goal.  Avoiding excessive salt intake? [x]   YES  []   NO Trying to exercise on a regular basis? [x]   YES  []   NO Review: taking medications as instructed, no medication side effects noted, no TIAs, no chest pain on exertion, no dyspnea on exertion, no swelling of ankles.   Wt Readings from Last 3 Encounters:  06/02/16 168 lb (76.2 kg)  07/20/12 180 lb 12.4 oz (82 kg)  07/13/12 174 lb 6.4 oz (79.1 kg)   Reports that she has never smoked. She has never used smokeless tobacco.  BP Readings from Last 3 Encounters:  06/02/16 138/72  07/21/12 (!) 145/52  07/13/12 (!) 177/83   Lab Results  Component Value Date   CREATININE 1.01 07/13/2012     2. Type 2 diabetes mellitus without complication.  Current symptoms: no polyuria or polydipsia, no chest pain, dyspnea or TIA's, no numbness, tingling or pain in extremities.  Taking medication compliantly without noted sided effects [x]   YES  []   NO  Episodes of hypoglycemia? []   YES  [x]   NO Maintaining a diabetic diet? [x]   YES  []   NO Trying to exercise on a regular basis? [x]   YES  []   NO  On ACE inhibitor or angiotensin II receptor blocker? [x]   YES  []   NO On Aspirin? []   YES  [x]   NO   3. Hyperlipidemia.  Is the patient taking medications without problems? []   YES  [x]   NO Trying to exercise on a regular basis? [x]   YES  []   NO Diet Compliance: compliant most of the time. Cardiovascular ROS: no chest pain or dyspnea on exertion.     4. Estrogen deficiency. Due for DEXA.    5. Encounter for hepatitis C virus screening test for high risk patient. Due for hepatitis C screening.    7. Hx  of thyroid nodule. Status post removal. Endocrine ROS: negative for - hair pattern changes, malaise/lethargy, mood swings, palpitations, polydipsia/polyuria, skin changes, temperature intolerance or unexpected weight changes.     8. Medication management. Patient would benefit from B12 checked due to long-term metformin use.    9. Low vitamin D level. History of vitamin D deficiency. Due for a vitamin D check.    Health Maintenance Due  Topic Date Due  . HEMOGLOBIN A1C  08/01/1950  . Hepatitis C Screening  1950/12/31  . FOOT EXAM  02/04/1959  . OPHTHALMOLOGY EXAM  02/04/1959  . URINE MICROALBUMIN  02/04/1959  . COLONOSCOPY  02/04/1999  . DEXA SCAN  02/03/2014  . PNA vac Low Risk Adult (1 of 2 - PCV13) 02/03/2014   Immunization History  Administered Date(s) Administered  . Tdap 03/05/2011   PMHx, SurgHx, SocialHx, Medications, and Allergies were reviewed in the Visit Navigator and updated as appropriate.   Past Medical History:  Diagnosis Date  . Arthritis   . Cataracts, bilateral   . Cervical disc disease   . Chronic kidney disease    Dr. Hillery Hunter yearly for CKD stage II as of 11/26/11  . Diabetes mellitus ([place])   . Eczema   . Glaucoma   .  Heart murmur   . History of chicken pox   . Hyperlipidemia   . Hypertension   . Seasonal allergies   . Thyroid nodule    Past Surgical History:  Procedure Laterality Date  . [place]  . COLONOSCOPY    . ORIF ANKLE FRACTURE  11/05/2011   Procedure: OPEN REDUCTION INTERNAL FIXATION (ORIF) ANKLE FRACTURE;  Surgeon: Wylene Simmer, MD;  Location: [facility];  Service: Orthopedics;  Laterality: Left;  OPEN REDUCTION INTERNAL FIXATION LEFT LATERAL MALLEOLUS FRACTURE WITH STRESS XRAYS WITH FLOUROSCOPY  . ORIF ELBOW FRACTURE  03/05/2011   Procedure: OPEN REDUCTION INTERNAL FIXATION (ORIF) ELBOW/OLECRANON FRACTURE;  Surgeon: Linna Hoff, MD;  Location: [facility];  Service: Orthopedics;  Laterality: Left;  . THYROID  LOBECTOMY Right 07/20/2012  . THYROIDECTOMY Right 07/20/2012   Procedure: RIGHT THYROID LOBECTOMY WITH ISTHMUSECTOMY;  Surgeon: Izora Gala, MD;  Location: [facility];  Service: ENT;  Laterality: Right;  . TONSILLECTOMY    . TUBAL LIGATION     No family history on file. Social History  Substance Use Topics  . Smoking status: Never Smoker  . Smokeless tobacco: Never Used  . Alcohol use No   Current Medications and Allergies:   .  bimatoprost (LUMIGAN) 0.03 % ophthalmic solution, Place 1 drop into both eyes at bedtime., Disp: , Rfl:  .  carvedilol (COREG) 6.25 MG tablet, Take 6.25 mg by mouth 2 (two) times daily with a meal., Disp: , Rfl:  .  Cholecalciferol (VITAMIN D) 1000 UNITS capsule, Take 1,000 Units by mouth 2 (two) times daily., Disp: , Rfl:  .  rosuvastatin (CRESTOR) 10 MG tablet, Take 10 mg by mouth daily., Disp: , Rfl:  .  Saxagliptin-Metformin (KOMBIGLYZE XR) 2.05-998 MG TB24, Take 1 tablet by mouth 2 (two) times daily. , Disp: , Rfl:  .  timolol (TIMOPTIC-XR) 0.5 % ophthalmic gel-forming, Place 1 drop into both eyes daily., Disp: , Rfl:   No Known Allergies   Review of Systems:   Review of Systems  Constitutional: Negative for chills, fever and malaise/fatigue.  HENT: Negative for ear pain, sinus pain and sore throat.   Eyes: Negative for blurred vision and double vision.  Respiratory: Negative for cough, shortness of breath and wheezing.   Cardiovascular: Negative for chest pain, palpitations and leg swelling.  Gastrointestinal: Negative for abdominal pain, nausea and vomiting.  Musculoskeletal: Negative for back pain, joint pain and neck pain.  Neurological: Negative for dizziness and headaches.  Psychiatric/Behavioral: Negative for depression, hallucinations and memory loss.   Vitals:   Vitals:   06/02/16 0742  BP: 138/72  Pulse: 74  Temp: 97.7 F (36.5 C)  TempSrc: Oral  SpO2: 97%  Weight: 168 lb (76.2 kg)  Height: 5\' 4"  (1.626 m)     Body mass index is  28.84 kg/m.  Physical Exam:   Physical Exam  Constitutional: She appears well-developed and well-nourished. No distress.  HENT:  Head: Normocephalic and atraumatic.  Eyes: EOM are normal. Pupils are equal, round, and reactive to light.  Neck: Normal range of motion. Neck supple.  Cardiovascular: Normal rate, regular rhythm and intact distal pulses.   Murmur heard. Pulmonary/Chest: Effort normal.  Abdominal: Soft.  Musculoskeletal: Normal range of motion.  Neurological: She is alert.  Skin: Skin is warm.  Psychiatric: She has a normal mood and affect. Her behavior is normal.  Nursing note and vitals reviewed.  Assessment and Plan:   Khalani was seen today for establish care.  Diagnoses  and all orders for this visit:  Essential hypertension Comments: Well controlled.  No signs of complications, medication side effects, or red flags.  Continue current regimen.   Orders: -     Comprehensive metabolic panel -     Lipid panel -     POCT glycosylated hemoglobin (Hb A1C)  Type 2 diabetes without long-term insulin use Comments: Well controlled.  No signs of complications, medication side effects, or red flags.  Continue current regimen.   Orders: -     Comprehensive metabolic panel -     Lipid panel -     POCT glycosylated hemoglobin (Hb A1C)  Pure hypercholesterolemia Comments: I encouraged the patient to start the medication three times weekly. Will check fasting lipid panel today.  Estrogen deficiency -     DG Bone Density; Future  Encounter for hepatitis C virus screening test for high risk patient -     Hepatitis C antibody, reflex  History of anemia -     CBC  Thyroid nodule -     TSH  Medication management -     Vitamin B12  Low vitamin D level -     VITAMIN D 25 Hydroxy (Vit-D Deficiency, Fractures)    . Reviewed expectations re: course of current medical issues. . Discussed self-management of symptoms. . Outlined signs and symptoms indicating need for  more acute intervention. . Patient verbalized understanding and all questions were answered. Marland Kitchen Health Maintenance issues including appropriate healthy diet, exercise, and smoking avoidance were discussed with patient. . See orders for this visit as documented in the electronic medical record. . Patient received an After Visit Summary.  CMA served as Education administrator during this visit. History, Physical, and Plan performed by medical provider. The above documentation has been reviewed and is accurate and complete. Briscoe Deutscher, D.O.  Briscoe Deutscher, DO [place] [place] 06/02/2016  Future Appointments Date Time Provider Department Center  06/16/2016 10:00 AM Riki Sheer, Tyler Aas, RN [facility] None

## 2016-06-07 ENCOUNTER — Encounter: Payer: Self-pay | Admitting: Family Medicine

## 2016-06-07 DIAGNOSIS — D638 Anemia in other chronic diseases classified elsewhere: Secondary | ICD-10-CM | POA: Insufficient documentation

## 2016-06-15 ENCOUNTER — Telehealth: Payer: Self-pay | Admitting: *Deleted

## 2016-06-15 NOTE — Progress Notes (Deleted)
Pre visit review using our clinic review tool, if applicable. No additional management support is needed unless otherwise documented below in the visit note. 

## 2016-06-15 NOTE — Telephone Encounter (Signed)
Rescheduled AWV as [facility] reports last AWV on 07/01/15.

## 2016-06-16 ENCOUNTER — Ambulatory Visit: Payer: Medicare Other | Admitting: *Deleted

## 2016-06-16 NOTE — Telephone Encounter (Signed)
This encounter was created in error - please disregard.

## 2016-06-26 ENCOUNTER — Telehealth: Payer: Self-pay

## 2016-06-26 ENCOUNTER — Telehealth: Payer: Self-pay | Admitting: Family Medicine

## 2016-06-26 NOTE — Telephone Encounter (Signed)
Please refer back to most recent lab results in chart for annotations. Looks like pt signed the record release but I do not see any records in the chart. Have you by chance see these records yet? Pt was calling back about her iron supplement and if she needs to stop her Metformin. Please review. Thanks.

## 2016-06-26 NOTE — Telephone Encounter (Signed)
Spoke with patient. See other phone note.

## 2016-06-26 NOTE — Telephone Encounter (Signed)
Patient requesting a call to discuss lab results with Autumn. Call patient as soon as possible.

## 2016-06-29 NOTE — Progress Notes (Signed)
Pre visit review using our clinic review tool, if applicable. No additional management support is needed unless otherwise documented below in the visit note. 

## 2016-06-29 NOTE — Progress Notes (Addendum)
Subjective:   Michelle Cummings is a 66 y.o. female who presents for Medicare Annual (Subsequent) preventive examination.  Review of Systems:  No ROS.  Medicare Wellness Visit. Additional risk factors are reflected in the social history.  Left ring finger middle joint swollen, right hand pointer finger swollen. Denies history of arthritis.   Cardiac Risk Factors include: advanced age (>71men, >31 women);dyslipidemia;diabetes mellitus;hypertension;obesity (BMI >30kg/m2)    Home Safety/Smoke Alarms: Feels safe in home. Smoke alarms in place.  Living environment; residence and Firearm Safety: Lives with husband in one story single family home.  Seat Belt Safety/Bike Helmet: Wears seat belt.   Counseling:   Eye Exam- Dr Katy Fitch. Last was 10/23/2015. Records release signed. Dental- Dr Deatra Ina, every 6 months.   Female:   Pap-  Aged out. Instructed to let office know if any problems.     Mammo-  12/11/2015   Negative.  Dexa scan-  Ordered at last visit through breast center. Pt will call to schedule this.  CCS- Dr Collene Mares. 2017. Polyps removed. 5 year recall. Records release signed.    Objective:     Vitals: BP 138/74 (BP Location: Left Arm, Patient Position: Sitting, Cuff Size: Large)   Pulse 68   Resp 14   Ht 5\' 4"  (1.626 m)   Wt 169 lb 6.4 oz (76.8 kg)   SpO2 97%   BMI 29.08 kg/m   Body mass index is 29.08 kg/m.   Tobacco History  Smoking Status  . Never Smoker  Smokeless Tobacco  . Never Used     Counseling given: Not Answered   Past Medical History:  Diagnosis Date  . Arthritis   . Cataracts, bilateral   . Cervical disc disease   . Chronic kidney disease    Dr. Hillery Hunter yearly for CKD stage II as of 11/26/11  . Diabetes mellitus ([place])   . Eczema   . Glaucoma   . Heart murmur   . History of chicken pox   . Hyperlipidemia   . Hypertension   . Seasonal allergies   . Thyroid nodule    Past Surgical History:  Procedure Laterality Date  . [place]  . COLONOSCOPY    . ORIF ANKLE FRACTURE  11/05/2011   Procedure: OPEN REDUCTION INTERNAL FIXATION (ORIF) ANKLE FRACTURE;  Surgeon: Wylene Simmer, MD;  Location: [facility] [facility];  Service: Orthopedics;  Laterality: Left;  OPEN REDUCTION INTERNAL FIXATION LEFT LATERAL MALLEOLUS FRACTURE WITH STRESS XRAYS WITH FLOUROSCOPY  . ORIF ELBOW FRACTURE  03/05/2011   Procedure: OPEN REDUCTION INTERNAL FIXATION (ORIF) ELBOW/OLECRANON FRACTURE;  Surgeon: Linna Hoff, MD;  Location: [facility];  Service: Orthopedics;  Laterality: Left;  . THYROID LOBECTOMY Right 07/20/2012  . THYROIDECTOMY Right 07/20/2012   Procedure: RIGHT THYROID LOBECTOMY WITH ISTHMUSECTOMY;  Surgeon: Izora Gala, MD;  Location: [facility];  Service: ENT;  Laterality: Right;  . TONSILLECTOMY    . TUBAL LIGATION     History reviewed. No pertinent family history. History  Sexual Activity  . Sexual activity: Yes  . Birth control/ protection: Surgical    Comment: BTL    Outpatient Encounter Prescriptions as of 07/01/2016  Medication Sig  . bimatoprost (LUMIGAN) 0.03 % ophthalmic solution Place 1 drop into both eyes at bedtime.  . carvedilol (COREG) 6.25 MG tablet Take 6.25 mg by mouth 2 (two) times daily with a meal.  . Cholecalciferol (VITAMIN D) 1000 UNITS capsule Take 1,000 Units by mouth 2 (two) times daily.  Marland Kitchen  rosuvastatin (CRESTOR) 10 MG tablet Take 10 mg by mouth daily.  . Saxagliptin-Metformin (KOMBIGLYZE XR) 2.05-998 MG TB24 Take 1 tablet by mouth 2 (two) times daily.   . valsartan-hydrochlorothiazide (DIOVAN HCT) 160-25 MG tablet Take 1 tablet by mouth daily.  . vitamin C (ASCORBIC ACID) 500 MG tablet Take 1,000 mg by mouth daily.  . timolol (TIMOPTIC-XR) 0.5 % ophthalmic gel-forming Place 1 drop into both eyes daily.  . [DISCONTINUED] valsartan (DIOVAN) 80 MG tablet Take 80 mg by mouth daily.   No facility-administered encounter medications on file as of 07/01/2016.     Activities of Daily Living In your present  state of health, do you have any difficulty performing the following activities: 07/01/2016  Hearing? N  Vision? N  Difficulty concentrating or making decisions? N  Walking or climbing stairs? N  Dressing or bathing? N  Doing errands, shopping? N  Preparing Food and eating ? N  Using the Toilet? N  In the past six months, have you accidently leaked urine? N  Do you have problems with loss of bowel control? N  Managing your Medications? N  Managing your Finances? N  Housekeeping or managing your Housekeeping? N  Some recent data might be hidden    Patient Care Team: Briscoe Deutscher, DO as PCP - General (Family Medicine)    Assessment:    Physical assessment deferred to PCP.  Exercise Activities and Dietary recommendations Current Exercise Habits: Structured exercise class, Type of exercise: strength training/weights;stretching;yoga;treadmill;walking, Time (Minutes): > 60, Frequency (Times/Week): 4, Weekly Exercise (Minutes/Week): 0, Intensity: Moderate   Diet (meal preparation, eat out, water intake, caffeinated beverages, dairy products, fruits and vegetables): 2-3 meals/day. Pt states that she tries to stay away from [place] foods and eat foods full of color. Pt states that she knows what she needs to eat and that she should eat 4-5 small meals/day. States she has a sheet from her Cardiologist that tells her what to eat. Pt seems very eager to get back to healthy eating.   Goals    . Eat healthy, exerise and decrease amount of medications      Fall Risk Fall Risk  07/01/2016 06/02/2016  Falls in the past year? No No   Depression Screen PHQ 2/9 Scores 07/01/2016 06/02/2016  PHQ - 2 Score 0 0     Cognitive Function   Ad8 score reviewed for issues:  Issues making decisions:no  Less interest in hobbies / activities:no  Repeats questions, stories (family complaining):no  Trouble using ordinary gadgets (microwave, computer, phone):no  Forgets the month or year:  no  Mismanaging finances: no  Remembering appts:no  Daily problems with thinking and/or memory:no Ad8 score is=0      Immunization History  Administered Date(s) Administered  . Tdap 03/05/2011   Screening Tests Health Maintenance  Topic Date Due  . FOOT EXAM  02/04/1959  . OPHTHALMOLOGY EXAM  02/04/1960  . COLONOSCOPY  02/04/1999  . DEXA SCAN  02/03/2014  . PNA vac Low Risk Adult (1 of 2 - PCV13) 02/03/2014  . INFLUENZA VACCINE  08/05/2016  . HEMOGLOBIN A1C  12/03/2016  . MAMMOGRAM  12/10/2017  . TETANUS/TDAP  03/04/2021  . Hepatitis C Screening  Completed      Plan:   Pt scheduling appointment for a physical and for her left hand ring finger swelling.   Records release signed for Dr Collene Mares (GI), Dr Hillery Hunter (Nephrologist), Dr Katy Fitch (Opthamologist) during this visit.  Pt will schedule DEXA.  Advance directives given to pt  during visit for her to look over and fill out at home.  I have personally reviewed and noted the following in the patient's chart:   . Medical and social history . Use of alcohol, tobacco or illicit drugs  . Current medications and supplements . Functional ability and status . Nutritional status . Physical activity . Advanced directives . List of other physicians . Vitals . Screenings to include cognitive, depression, and falls . Referrals and appointments  In addition, I have reviewed and discussed with patient certain preventive protocols, quality metrics, and best practice recommendations. A written personalized care plan for preventive services as well as general preventive health recommendations were provided to patient.     Ree Edman, RN  07/01/2016  I have personally reviewed the Medicare Annual Wellness questionnaire and have noted 1. The patient's medical and social history 2. Their use of alcohol, tobacco or illicit drugs 3. Their current medications and supplements 4. The patient's functional ability including  ADL's, fall risks, home safety risks and hearing or visual impairment. 5. Diet and physical activities 6. Evidence for depression or mood disorders 7. Reviewed Updated provider list, see scanned forms and [facility] Snapshot.   The patients weight, height, BMI and visual acuity have been recorded in the chart I have made referrals, counseling and provided education to the patient based review of the above and I have provided the pt with a written personalized care plan for preventive services.  I have provided the patient with a copy of your personalized plan for preventive services. Instructed to take the time to review along with their updated medication list.  Briscoe Deutscher, D.O. [place], [facility]

## 2016-07-01 ENCOUNTER — Encounter: Payer: Self-pay | Admitting: *Deleted

## 2016-07-01 ENCOUNTER — Ambulatory Visit (INDEPENDENT_AMBULATORY_CARE_PROVIDER_SITE_OTHER): Payer: Medicare Other | Admitting: *Deleted

## 2016-07-01 ENCOUNTER — Other Ambulatory Visit: Payer: Self-pay

## 2016-07-01 VITALS — BP 138/74 | HR 68 | Resp 14 | Ht 64.0 in | Wt 169.4 lb

## 2016-07-01 DIAGNOSIS — Z0000 Encounter for general adult medical examination without abnormal findings: Secondary | ICD-10-CM | POA: Diagnosis not present

## 2016-07-01 DIAGNOSIS — N189 Chronic kidney disease, unspecified: Secondary | ICD-10-CM

## 2016-07-01 MED ORDER — ROSUVASTATIN CALCIUM 10 MG PO TABS: 10.0000 mg | ORAL_TABLET | Freq: Every day | ORAL | 3 refills | Status: DC

## 2016-07-01 NOTE — Telephone Encounter (Signed)
Okay to start iron. Okay to stay on Metformin for now. Need referral to Renal and records.

## 2016-07-01 NOTE — Telephone Encounter (Signed)
Referral place.  Patient notified to stay on metformin and begin iron supplementation 325 mg daily.  Patient was notified while in the office for AWV.

## 2016-07-01 NOTE — Patient Instructions (Signed)
Bring a copy of your advance directives to your next office visit. After your diabetic foot exam please have your records sent to our office.  Schedule your bone density exam.  Schedule your mammogram for after 12/11/16.   Preventive Care 67 Years and Older, Female Preventive care refers to lifestyle choices and visits with your health care provider that can promote health and wellness. What does preventive care include?  A yearly physical exam. This is also called an annual well check.  Dental exams once or twice a year.  Routine eye exams. Ask your health care provider how often you should have your eyes checked.  Personal lifestyle choices, including: ? Daily care of your teeth and gums. ? Regular physical activity. ? Eating a healthy diet. ? Avoiding tobacco and drug use. ? Limiting alcohol use. ? Practicing safe sex. ? Taking low-dose aspirin every day. ? Taking vitamin and mineral supplements as recommended by your health care provider. What happens during an annual well check? The services and screenings done by your health care provider during your annual well check will depend on your age, overall health, lifestyle risk factors, and family history of disease. Counseling Your health care provider may ask you questions about your:  Alcohol use.  Tobacco use.  Drug use.  Emotional well-being.  Home and relationship well-being.  Sexual activity.  Eating habits.  History of falls.  Memory and ability to understand (cognition).  Work and work Statistician.  Reproductive health.  Screening You may have the following tests or measurements:  Height, weight, and BMI.  Blood pressure.  Lipid and cholesterol levels. These may be checked every 5 years, or more frequently if you are over 24 years old.  Skin check.  Lung cancer screening. You may have this screening every year starting at age 27 if you have a 30-pack-year history of smoking and currently smoke or  have quit within the past 15 years.  Fecal occult blood test (FOBT) of the stool. You may have this test every year starting at age 67.  Flexible sigmoidoscopy or colonoscopy. You may have a sigmoidoscopy every 5 years or a colonoscopy every 10 years starting at age 31.  Hepatitis C blood test.  Hepatitis B blood test.  Sexually transmitted disease (STD) testing.  Diabetes screening. This is done by checking your blood sugar (glucose) after you have not eaten for a while (fasting). You may have this done every 1-3 years.  Bone density scan. This is done to screen for osteoporosis. You may have this done starting at age 60.  Mammogram. This may be done every 1-2 years. Talk to your health care provider about how often you should have regular mammograms.  Talk with your health care provider about your test results, treatment options, and if necessary, the need for more tests. Vaccines Your health care provider may recommend certain vaccines, such as:  Influenza vaccine. This is recommended every year.  Tetanus, diphtheria, and acellular pertussis (Tdap, Td) vaccine. You may need a Td booster every 10 years.  Varicella vaccine. You may need this if you have not been vaccinated.  Zoster vaccine. You may need this after age 75.  Measles, mumps, and rubella (MMR) vaccine. You may need at least one dose of MMR if you were born in 1957 or later. You may also need a second dose.  Pneumococcal 13-valent conjugate (PCV13) vaccine. One dose is recommended after age 70.  Pneumococcal polysaccharide (PPSV23) vaccine. One dose is recommended after age 65.  Meningococcal vaccine. You may need this if you have certain conditions.  Hepatitis A vaccine. You may need this if you have certain conditions or if you travel or work in places where you may be exposed to hepatitis A.  Hepatitis B vaccine. You may need this if you have certain conditions or if you travel or work in places where you may  be exposed to hepatitis B.  Haemophilus influenzae type b (Hib) vaccine. You may need this if you have certain conditions.  Talk to your health care provider about which screenings and vaccines you need and how often you need them. This information is not intended to replace advice given to you by your health care provider. Make sure you discuss any questions you have with your health care provider. Document Released: 01/18/2015 Document Revised: 09/11/2015 Document Reviewed: 10/23/2014 Elsevier Interactive Patient Education  2017 [employer].

## 2016-07-02 ENCOUNTER — Ambulatory Visit (INDEPENDENT_AMBULATORY_CARE_PROVIDER_SITE_OTHER): Payer: Medicare Other | Admitting: Family Medicine

## 2016-07-02 ENCOUNTER — Encounter: Payer: Self-pay | Admitting: Family Medicine

## 2016-07-02 VITALS — BP 148/74 | HR 84 | Temp 98.2°F | Ht 64.0 in | Wt 168.2 lb

## 2016-07-02 DIAGNOSIS — I10 Essential (primary) hypertension: Secondary | ICD-10-CM

## 2016-07-02 DIAGNOSIS — E1122 Type 2 diabetes mellitus with diabetic chronic kidney disease: Secondary | ICD-10-CM

## 2016-07-02 DIAGNOSIS — N183 Type 2 diabetes mellitus with diabetic chronic kidney disease: Secondary | ICD-10-CM

## 2016-07-02 DIAGNOSIS — M25441 Effusion, right hand: Secondary | ICD-10-CM | POA: Diagnosis not present

## 2016-07-02 DIAGNOSIS — M10342 Gout due to renal impairment, left hand: Secondary | ICD-10-CM

## 2016-07-02 DIAGNOSIS — Z0000 Encounter for general adult medical examination without abnormal findings: Secondary | ICD-10-CM

## 2016-07-02 DIAGNOSIS — D638 Anemia in other chronic diseases classified elsewhere: Secondary | ICD-10-CM | POA: Diagnosis not present

## 2016-07-02 DIAGNOSIS — M25449 Effusion, unspecified hand: Secondary | ICD-10-CM | POA: Diagnosis not present

## 2016-07-02 DIAGNOSIS — M109 Gout, unspecified: Secondary | ICD-10-CM | POA: Insufficient documentation

## 2016-07-02 LAB — URINALYSIS, MICROSCOPIC ONLY: RBC / HPF: NONE SEEN (ref 0–?)

## 2016-07-02 LAB — URIC ACID: Uric Acid, Serum: 9.2 mg/dL — ABNORMAL HIGH (ref 2.4–7.0)

## 2016-07-02 LAB — BASIC METABOLIC PANEL
BUN: 45 mg/dL — ABNORMAL HIGH (ref 6–23)
CO2: 25 mEq/L (ref 19–32)
Calcium: 10 mg/dL (ref 8.4–10.5)
Chloride: 103 mEq/L (ref 96–112)
Creatinine, Ser: 1.64 mg/dL — ABNORMAL HIGH (ref 0.40–1.20)
GFR: 40.14 mL/min — ABNORMAL LOW (ref >60.00)
Glucose, Bld: 129 mg/dL — ABNORMAL HIGH (ref 70–99)
Potassium: 4.4 mEq/L (ref 3.5–5.1)
Sodium: 137 mEq/L (ref 135–145)

## 2016-07-02 LAB — PHOSPHORUS: Phosphorus: 4.2 mg/dL (ref 2.3–4.6)

## 2016-07-02 MED ORDER — DICLOFENAC SODIUM 2 % TD SOLN: 2.0000 g | Freq: Once | TRANSDERMAL | 0 refills | Status: AC

## 2016-07-02 MED ORDER — COLCHICINE 0.6 MG PO TABS: 0.6000 mg | ORAL_TABLET | Freq: Every day | ORAL | 0 refills | Status: DC

## 2016-07-02 MED ORDER — LIRAGLUTIDE 18 MG/3ML ~~LOC~~ SOPN: 1.2000 mg | PEN_INJECTOR | Freq: Every day | SUBCUTANEOUS | 3 refills | Status: DC

## 2016-07-02 MED ORDER — FERROUS SULFATE 325 (65 FE) MG PO TABS: 325.0000 mg | ORAL_TABLET | Freq: Every day | ORAL | 3 refills | Status: DC

## 2016-07-02 NOTE — Progress Notes (Signed)
Michelle Cummings is a 66 y.o. female is here for follow up.  History of Present Illness:   Shaune Pascal CMA acting as scribe for Dr. Juleen China.  HPI: Previous labs reviewed with patient. AMW visit reviewed with patient. SEE AP for other items addressed.  Health Maintenance Due  Topic Date Due  . FOOT EXAM  02/04/1960  . OPHTHALMOLOGY EXAM  02[birth year]04/1960  . COLONOSCOPY  02/04/1999  . DEXA SCAN  02/03/2014   PMHx, SurgHx, SocialHx, FamHx, Medications, and Allergies were reviewed in the Visit Navigator and updated as appropriate.   Patient Active Problem List   Diagnosis Date Noted  . Gout 07/02/2016  . Anemia of chronic disease 06/07/2016  . HLD (hyperlipidemia) 06/02/2016  . Glaucoma 06/02/2016  . Chronic kidney disease, stage 3, mod decreased GFR 06/02/2016  . HTN (hypertension) 03/05/2011  . Diabetes mellitus ([place]) 03/05/2011   Social History  Substance Use Topics  . Smoking status: Never Smoker  . Smokeless tobacco: Never Used  . Alcohol use No   Current Medications and Allergies:   .  bimatoprost (LUMIGAN) 0.03 % ophthalmic solution, Place 1 drop into both eyes at bedtime., Disp: , Rfl:  .  carvedilol (COREG) 6.25 MG tablet, Take 6.25 mg by mouth 2 (two) times daily with a meal., Disp: , Rfl:  .  Cholecalciferol (VITAMIN D) 1000 UNITS capsule, Take 1,000 Units by mouth 2 (two) times daily., Disp: , Rfl:  .  rosuvastatin (CRESTOR) 10 MG tablet, Take 1 tablet (10 mg total) by mouth daily., Disp: 30 tablet, Rfl: 3 .  Saxagliptin-Metformin (KOMBIGLYZE XR) 2.05-998 MG TB24, Take 1 tablet by mouth 2 (two) times daily. , Disp: , Rfl:  .  timolol (TIMOPTIC-XR) 0.5 % ophthalmic gel-forming, Place 1 drop into both eyes daily., Disp: , Rfl:  .  valsartan-hydrochlorothiazide (DIOVAN HCT) 160-25 MG tablet, Take 1 tablet by mouth daily., Disp: , Rfl:  .  vitamin C (ASCORBIC ACID) 500 MG tablet, Take 1,000 mg by mouth daily., Disp: , Rfl:   No Known Allergies   Review of Systems    Review of Systems  Constitutional: Negative for chills, fever, malaise/fatigue and weight loss.  Respiratory: Negative for cough, shortness of breath and wheezing.   Cardiovascular: Negative for chest pain, palpitations and leg swelling.  Gastrointestinal: Negative for abdominal pain, constipation, diarrhea, nausea and vomiting.  Genitourinary: Negative for dysuria and urgency.  Musculoskeletal: Positive for joint pain. Negative for myalgias.  Skin: Negative for rash.  Neurological: Negative for dizziness and headaches.  Psychiatric/Behavioral: Negative for depression, substance abuse and suicidal ideas. The patient is not nervous/anxious.    Vitals:   Vitals:   07/02/16 1335  BP: (!) 148/74  Pulse: 84  Temp: 98.2 F (36.8 C)  TempSrc: Oral  SpO2: 98%  Weight: 168 lb 3.2 oz (76.3 kg)  Height: 5\' 4"  (1.626 m)     Body mass index is 28.87 kg/m.  Physical Exam:   Physical Exam  Constitutional: She is oriented to person, place, and time. She appears well-developed and well-nourished. No distress.  HENT:  Head: Normocephalic and atraumatic.  Right Ear: External ear normal.  Left Ear: External ear normal.  Nose: Nose normal.  Mouth/Throat: Oropharynx is clear and moist.  Eyes: Conjunctivae and EOM are normal. Pupils are equal, round, and reactive to light.  Neck: Normal range of motion. Neck supple. No thyromegaly present.  Cardiovascular: Normal rate, regular rhythm, normal heart sounds and intact distal pulses.   Pulmonary/Chest: Effort normal and  breath sounds normal.  Abdominal: Soft. Bowel sounds are normal.  Lymphadenopathy:    She has no cervical adenopathy.  Neurological: She is alert and oriented to person, place, and time.  Skin: Skin is warm.  Psychiatric: She has a normal mood and affect. Her behavior is normal.  Nursing note and vitals reviewed.   Diabetic Foot Exam - Simple   Simple Foot Form Diabetic Foot exam was performed with the following findings:   Yes 07/02/2016  8:19 PM  Visual Inspection No deformities, no ulcerations, no other skin breakdown bilaterally:  Yes Sensation Testing Intact to touch and monofilament testing bilaterally:  Yes Pulse Check Posterior Tibialis and Dorsalis pulse intact bilaterally:  Yes Comments    Results for orders placed or performed in visit on 07/02/16  Uric acid  Result Value Ref Range   Uric Acid, Serum 9.2 (H) 2.4 - 7.0 mg/dL  Basic metabolic panel  Result Value Ref Range   Sodium 137 135 - 145 mEq/L   Potassium 4.4 3.5 - 5.1 mEq/L   Chloride 103 96 - 112 mEq/L   CO2 25 19 - 32 mEq/L   Glucose, Bld 129 (H) 70 - 99 mg/dL   BUN 45 (H) 6 - 23 mg/dL   Creatinine, Ser 1.64 (H) 0.40 - 1.20 mg/dL   Calcium 10.0 8.4 - 10.5 mg/dL   GFR 40.14 (L) >60.00 mL/min  Phosphorus  Result Value Ref Range   Phosphorus 4.2 2.3 - 4.6 mg/dL  Urine Microscopic  Result Value Ref Range   WBC, UA 3-6/hpf (A) 0-2/hpf   RBC / HPF none seen 0-2/hpf   Squamous Epithelial / LPF Rare(0-4/hpf) Rare(0-4/hpf)   Assessment and Plan:   Diagnoses and all orders for this visit:  Routine physical examination  Swelling of joint of right hand Comments: Left PIP swelling and ttp. High uric acid.  Orders: -     ferrous sulfate 325 (65 FE) MG tablet; Take 1 tablet (325 mg total) by mouth daily with breakfast. -     Uric acid -     Basic metabolic panel -     Phosphorus -     Urine Microscopic -     Diclofenac Sodium (PENNSAID) 2 % SOLN; Place 2 g onto the skin once.  Essential hypertension Comments: Will remove HCTZ.   Chronic kidney disease, stage 3, mod decreased GFR Comments: Patient has not seen Renal in a few years. Referral placed.  Orders: -     Ambulatory referral to Nephrology  Acute gout due to renal impairment involving left hand Comments: Rx colchicine as below. Hold statin to avoid myalgias while taking. Will discuss holding HCTZ. Orders: -     colchicine 0.6 MG tablet; Take 1 tablet (0.6 mg  total) by mouth daily.  Anemia of chronic disease Orders: -     Ambulatory referral to Nephrology  Type 2 diabetes mellitus with stage 3 chronic kidney disease, without long-term current use of insulin (HCC) Comments: Stop Saxagliptin-Metformin with Stage 3 CKD. Will begin Victoza instead. Orders: -     liraglutide 18 MG/3ML SOPN; Inject 0.2 mLs (1.2 mg total) into the skin daily.   Patient Counseling: [x]    Nutrition: Stressed importance of moderation in sodium/caffeine intake, saturated fat and cholesterol, caloric balance, sufficient intake of fresh fruits, vegetables, fiber, calcium, iron, and 1 mg of folate supplement per day (for females capable of pregnancy).  [x]    Stressed the importance of regular exercise.   [x]    Substance Abuse: Discussed  cessation/primary prevention of tobacco, alcohol, or other drug use; driving or other dangerous activities under the influence; availability of treatment for abuse.   [x]    Injury prevention: Discussed safety belts, safety helmets, smoke detector, smoking near bedding or upholstery.   []    Sexuality: Discussed sexually transmitted diseases, partner selection, use of condoms, avoidance of unintended pregnancy and contraceptive alternatives.  [x]    Dental health: Discussed importance of regular tooth brushing, flossing, and dental visits.  [x]    Health maintenance and immunizations reviewed. Please refer to Health maintenance section.    . Reviewed expectations re: course of current medical issues. . Discussed self-management of symptoms. . Outlined signs and symptoms indicating need for more acute intervention. . Patient verbalized understanding and all questions were answered. Marland Kitchen Health Maintenance issues including appropriate healthy diet, exercise, and smoking avoidance were discussed with patient. . See orders for this visit as documented in the electronic medical record. . Patient received an After Visit Summary.  CMA served as Education administrator  during this visit. History, Physical, and Plan performed by medical provider. The above documentation has been reviewed and is accurate and complete. Briscoe Deutscher, D.O.  Briscoe Deutscher, DO [place] [place] 07/02/2016  Future Appointments Date Time Provider [place]  07/02/2017 10:00 AM Stephanie Acre, RN [facility] None

## 2016-07-06 ENCOUNTER — Telehealth: Payer: Self-pay | Admitting: Family Medicine

## 2016-07-06 NOTE — Telephone Encounter (Signed)
ROI fax to [place]

## 2016-07-07 LAB — HM DIABETES EYE EXAM

## 2016-07-07 NOTE — Telephone Encounter (Signed)
Pt is returning phone call ° °

## 2016-07-07 NOTE — Telephone Encounter (Signed)
Pt returning your phone call

## 2016-07-09 ENCOUNTER — Telehealth: Payer: Self-pay | Admitting: Family Medicine

## 2016-07-09 ENCOUNTER — Ambulatory Visit: Payer: Medicare Other | Admitting: Family Medicine

## 2016-07-09 ENCOUNTER — Ambulatory Visit (INDEPENDENT_AMBULATORY_CARE_PROVIDER_SITE_OTHER): Payer: Medicare Other | Admitting: Family Medicine

## 2016-07-09 ENCOUNTER — Encounter: Payer: Self-pay | Admitting: Family Medicine

## 2016-07-09 VITALS — BP 176/82 | HR 89 | Temp 98.1°F | Wt 168.8 lb

## 2016-07-09 DIAGNOSIS — E782 Mixed hyperlipidemia: Secondary | ICD-10-CM

## 2016-07-09 DIAGNOSIS — M10342 Gout due to renal impairment, left hand: Secondary | ICD-10-CM

## 2016-07-09 DIAGNOSIS — D638 Anemia in other chronic diseases classified elsewhere: Secondary | ICD-10-CM

## 2016-07-09 DIAGNOSIS — N183 Chronic kidney disease, stage 3 unspecified: Secondary | ICD-10-CM

## 2016-07-09 DIAGNOSIS — E1122 Type 2 diabetes mellitus with diabetic chronic kidney disease: Secondary | ICD-10-CM | POA: Diagnosis not present

## 2016-07-09 DIAGNOSIS — I10 Essential (primary) hypertension: Secondary | ICD-10-CM | POA: Diagnosis not present

## 2016-07-09 NOTE — Telephone Encounter (Signed)
Left message for patient to return call for lab results. 

## 2016-07-09 NOTE — Telephone Encounter (Signed)
Patient scheduled appointment.

## 2016-07-09 NOTE — Progress Notes (Signed)
Michelle Cummings is a 66 y.o. female is here for follow up.  History of Present Illness:   Shaune Pascal CMA acting as scribe for Dr. Juleen China.  HPI Patient comes in today to review lab results.  Health Maintenance Due  Topic Date Due  . COLONOSCOPY  02/04/1999  . DEXA SCAN  02/03/2014   PMHx, SurgHx, SocialHx, FamHx, Medications, and Allergies were reviewed in the Visit Navigator and updated as appropriate.   Patient Active Problem List   Diagnosis Date Noted  . Gout 07/02/2016  . Anemia of chronic disease 06/07/2016  . HLD (hyperlipidemia) 06/02/2016  . Glaucoma 06/02/2016  . Chronic kidney disease, stage 3, mod decreased GFR 06/02/2016  . HTN (hypertension) 03/05/2011  . Diabetes mellitus ([place]) 03/05/2011   Social History  Substance Use Topics  . Smoking status: Never Smoker  . Smokeless tobacco: Never Used  . Alcohol use No   Current Medications and Allergies:   Current Outpatient Prescriptions:  .  bimatoprost (LUMIGAN) 0.03 % ophthalmic solution, Place 1 drop into both eyes at bedtime., Disp: , Rfl:  .  carvedilol (COREG) 6.25 MG tablet, Take 6.25 mg by mouth 2 (two) times daily with a meal., Disp: , Rfl:  .  Cholecalciferol (VITAMIN D) 1000 UNITS capsule, Take 1,000 Units by mouth 2 (two) times daily., Disp: , Rfl:  .  colchicine 0.6 MG tablet, Take 1 tablet (0.6 mg total) by mouth daily., Disp: 14 tablet, Rfl: 0 .  ferrous sulfate 325 (65 FE) MG tablet, Take 1 tablet (325 mg total) by mouth daily with breakfast., Disp: 30 tablet, Rfl: 3 .  liraglutide 18 MG/3ML SOPN, Inject 0.2 mLs (1.2 mg total) into the skin daily., Disp: 3 mL, Rfl: 3 .  rosuvastatin (CRESTOR) 10 MG tablet, Take 1 tablet (10 mg total) by mouth daily., Disp: 30 tablet, Rfl: 3 .  timolol (TIMOPTIC-XR) 0.5 % ophthalmic gel-forming, Place 1 drop into both eyes daily., Disp: , Rfl:  .  valsartan-hydrochlorothiazide (DIOVAN HCT) 160-25 MG tablet, Take 1 tablet by mouth daily., Disp: , Rfl:  .  vitamin C  (ASCORBIC ACID) 500 MG tablet, Take 1,000 mg by mouth daily., Disp: , Rfl:  No Known Allergies Review of Systems   Review of Systems  All other systems reviewed and are negative.  Vitals:   Vitals:   07/09/16 1509  BP: (!) 176/82  Pulse: 89  Temp: 98.1 F (36.7 C)  TempSrc: Oral  SpO2: 97%  Weight: 168 lb 12.8 oz (76.6 kg)     Body mass index is 28.97 kg/m.   Physical Exam:   Physical Exam  Constitutional: She appears well-developed and well-nourished. No distress.  Cardiovascular: Normal rate and regular rhythm.   Pulmonary/Chest: Effort normal.  Abdominal: Soft.  Nursing note and vitals reviewed.   CBC Latest Ref Rng & Units 06/02/2016 07/20/2012 07/13/2012  WBC 4.0 - 10.5 K/uL 5.9 - 7.2  Hemoglobin 12.0 - 15.0 g/dL 10.7(L) 12.2 11.0(L)  Hematocrit 36.0 - 46.0 % 32.5(L) 36.0 34.5(L)  Platelets 150.0 - 400.0 K/uL 290.0 - 276   Lab Results  Component Value Date   ALT 16 06/02/2016   AST 15 06/02/2016   ALKPHOS 67 06/02/2016   BILITOT 0.2 06/02/2016   Lab Results  Component Value Date   CREATININE 1.64 (H) 07/02/2016   CREATININE 1.43 (H) 06/02/2016   CREATININE 1.01 07/13/2012   Lab Results  Component Value Date   LABURIC 9.2 (H) 07/02/2016   Lab Results  Component Value  Date   HGBA1C 6.3 06/02/2016   Assessment and Plan:    Essential hypertension Comments: Continue current treatment for now. Will hold HCTZ if gout continue to be an issue.   Chronic kidney disease, stage 3, mod decreased GFR Comments: Patient has not seen Renal in a few years. Referral placed. Stop current nephrotoxic DM oral medications. Start Victoza. Continue ARB.  Orders: -     Ambulatory referral to Nephrology  Acute gout due to renal impairment involving left hand Comments: Rx colchicine as below. Hold statin to avoid myalgias while taking. Discussed holding HCTZ. Orders: -     colchicine 0.6 MG tablet; Take 1 tablet (0.6 mg total) by mouth daily.  Anemia of chronic  disease Orders: -     Ambulatory referral to Nephrology  Type 2 diabetes mellitus with stage 3 chronic kidney disease, without long-term current use of insulin (HCC) Comments: Stop Saxagliptin-Metformin with Stage 3 CKD. Will begin Victoza instead. Orders:       -     liraglutide 18 MG/3ML SOPN; Inject 0.2 mLs (1.2 mg total) into the skin daily.   . Reviewed expectations re: course of current medical issues. . Discussed self-management of symptoms. . Outlined signs and symptoms indicating need for more acute intervention. . Patient verbalized understanding and all questions were answered. Marland Kitchen Health Maintenance issues including appropriate healthy diet, exercise, and smoking avoidance were discussed with patient. . See orders for this visit as documented in the electronic medical record. . Patient received an After Visit Summary.  CMA served as Education administrator during this visit. History, Physical, and Plan performed by medical provider. The above documentation has been reviewed and is accurate and complete. Briscoe Deutscher, D.O.  Briscoe Deutscher, DO [place] 07/11/2016  Future Appointments Date Time Provider [place]  07/02/2017 10:00 AM Stephanie Acre, RN [facility] None

## 2016-07-09 NOTE — Telephone Encounter (Signed)
Rec'd from [facility] forward 5 pages to [employer] DO

## 2016-07-09 NOTE — Telephone Encounter (Signed)
Looks like this patient was seen on 06/28. Did you or Amber call her?

## 2016-07-15 ENCOUNTER — Telehealth: Payer: Self-pay | Admitting: Family Medicine

## 2016-07-15 NOTE — Telephone Encounter (Signed)
Rec;d from [facility] forward 4 pages Briscoe Deutscher DO.

## 2016-07-17 ENCOUNTER — Other Ambulatory Visit: Payer: Self-pay

## 2016-07-17 ENCOUNTER — Telehealth: Payer: Self-pay | Admitting: Family Medicine

## 2016-07-17 DIAGNOSIS — E1122 Type 2 diabetes mellitus with diabetic chronic kidney disease: Secondary | ICD-10-CM

## 2016-07-17 DIAGNOSIS — N183 Chronic kidney disease, stage 3 (moderate): Principal | ICD-10-CM

## 2016-07-17 MED ORDER — INSULIN PEN NEEDLE 31G X 6 MM MISC: 11 refills | Status: DC

## 2016-07-17 NOTE — Telephone Encounter (Signed)
**  Remind patient they can make refill requests via MyChart**  Medication refill request (Name & Dosage):  NEEDLES FOR VICTOZA  Preferred pharmacy (Name & Address):  [facility] - [place], [place] - [place] [phone] (Phone) [phone] (Fax)    Other comments (if applicable):

## 2016-07-17 NOTE — Telephone Encounter (Signed)
Rx for pen needles sent to patient's pharmacy.

## 2016-07-22 NOTE — Telephone Encounter (Signed)
Patient notified to take 1.2 mg daily.  She verbalized understanding.

## 2016-07-22 NOTE — Telephone Encounter (Signed)
Patient needs to know what is the dosage for the 3 days that Dr. Juleen China needs her to take. Patient needs a call back today, she has not taken any medication today. I advised patient that this was not a guarantee but, I would ask.

## 2016-08-05 ENCOUNTER — Telehealth: Payer: Self-pay | Admitting: Family Medicine

## 2016-08-05 NOTE — Telephone Encounter (Signed)
Clarify for me - which medications? Is she taking colchicine now? What was the wrong medication that she was taking?

## 2016-08-05 NOTE — Telephone Encounter (Signed)
Patient called back in reference to previous note. Patient stated that she was taking the wrong medication but she has the right one now for her gout. Patient does want to know if it is ok if she goes ahead and takes the medicine for gout. Please call patient and advise.

## 2016-08-05 NOTE — Telephone Encounter (Signed)
Patient called in reference to RT foot bothering her. Patient believes this is gout.   Patient would like to know if she can have a Rx sent in for the gout or if she needs to be seen.   Please call patient and advise. OK to leave message.

## 2016-08-05 NOTE — Telephone Encounter (Signed)
Please advise 

## 2016-08-06 NOTE — Telephone Encounter (Signed)
Talked to patient. She thought she was taking colchicine daily but she had it confused with her cholesterol medication. She is continuing her cholesterol medication daily and has now started taking Colchicine daily with improvement. She will let us know if she has any issues.

## 2016-08-06 NOTE — Telephone Encounter (Signed)
Patient returning missed phone call. Please call patient and advise.

## 2016-08-06 NOTE — Telephone Encounter (Signed)
Patient returning Autumn's phone call. Please call back at [phone].

## 2016-08-06 NOTE — Telephone Encounter (Signed)
Left message for patient to call back  

## 2016-09-01 ENCOUNTER — Other Ambulatory Visit: Payer: Self-pay | Admitting: Family Medicine

## 2016-09-01 NOTE — Telephone Encounter (Signed)
MEDICATION: VICTOZA  PHARMACY:  [facility], [place] - [place] [phone] (Phone) [phone] (Fax)   IS THIS A 90 DAY SUPPLY : Yes  IS PATIENT OUT OF MEDICATION: no  IF NOT; HOW MUCH IS LEFT: "a few" no further answer  LAST APPOINTMENT DATE:07/09/16  NEXT APPOINTMENT DATE:07/02/17  OTHER COMMENTS:    **Let patient know to contact pharmacy at the end of the day to make sure medication is ready. **  ** Please notify patient to allow 48-72 hours to process**  **Encourage patient to contact the pharmacy for refills or they can request refills through [facility]**

## 2016-09-01 NOTE — Telephone Encounter (Signed)
Spoke with patient and she is wanting a 90 day supply. Spoke with pharmacy and gave verbal orders for 90 supply with 1 refill.

## 2016-10-16 ENCOUNTER — Telehealth: Payer: Self-pay | Admitting: Family Medicine

## 2016-10-16 DIAGNOSIS — M10342 Gout due to renal impairment, left hand: Secondary | ICD-10-CM

## 2016-10-16 MED ORDER — COLCHICINE 0.6 MG PO TABS: 0.6000 mg | ORAL_TABLET | Freq: Every day | ORAL | 0 refills | Status: DC

## 2016-10-16 NOTE — Telephone Encounter (Signed)
Please advise on refill.

## 2016-10-16 NOTE — Telephone Encounter (Signed)
RX sent to pharmacy  

## 2016-10-16 NOTE — Telephone Encounter (Signed)
MEDICATION: colchicine 0.6 MG tablet(Expired)  PHARMACY:  [facility]  - [address]  IS THIS A 90 DAY SUPPLY : yes  IS PATIENT OUT OF MEDICATION: yes  IF NOT; HOW MUCH IS LEFT: n/a  LAST APPOINTMENT DATE: @7 /5/18  NEXT APPOINTMENT DATE:@Visit  date not found  OTHER COMMENTS:    **Let patient know to contact pharmacy at the end of the day to make sure medication is ready. **  ** Please notify patient to allow 48-72 hours to process**  **Encourage patient to contact the pharmacy for refills or they can request refills through [facility]**

## 2016-10-16 NOTE — Telephone Encounter (Signed)
Okay refill. 

## 2016-10-21 ENCOUNTER — Telehealth: Payer: Self-pay | Admitting: Family Medicine

## 2016-10-21 NOTE — Telephone Encounter (Signed)
MEDICATION: Insulin Pen Needle 31G X 6 MM MISC  PHARMACY:  [facility] - [place], [place] - [address]  IS THIS A 90 DAY SUPPLY : same as last time  IS PATIENT OUT OF MEDICATION: no  IF NOT; HOW MUCH IS LEFT: a few left  LAST APPOINTMENT DATE: @7 /5/18  NEXT APPOINTMENT DATE:@Visit  date not found  OTHER COMMENTS:    **Let patient know to contact pharmacy at the end of the day to make sure medication is ready. **  ** Please notify patient to allow 48-72 hours to process**  **Encourage patient to contact the pharmacy for refills or they can request refills through [facility]**

## 2016-10-22 ENCOUNTER — Other Ambulatory Visit: Payer: Self-pay

## 2016-10-22 DIAGNOSIS — E1122 Type 2 diabetes mellitus with diabetic chronic kidney disease: Secondary | ICD-10-CM

## 2016-10-22 DIAGNOSIS — N183 Chronic kidney disease, stage 3 unspecified: Secondary | ICD-10-CM

## 2016-10-22 MED ORDER — INSULIN PEN NEEDLE 31G X 6 MM MISC: 11 refills | Status: DC

## 2016-10-22 NOTE — Telephone Encounter (Signed)
Refill sent.

## 2016-11-05 ENCOUNTER — Ambulatory Visit (INDEPENDENT_AMBULATORY_CARE_PROVIDER_SITE_OTHER): Payer: Medicare Other | Admitting: Family Medicine

## 2016-11-05 ENCOUNTER — Encounter: Payer: Self-pay | Admitting: Family Medicine

## 2016-11-05 ENCOUNTER — Other Ambulatory Visit: Payer: Self-pay | Admitting: Family Medicine

## 2016-11-05 VITALS — BP 138/84 | HR 82 | Temp 97.5°F | Ht 64.0 in | Wt 168.4 lb

## 2016-11-05 DIAGNOSIS — N183 Chronic kidney disease, stage 3 (moderate): Secondary | ICD-10-CM | POA: Diagnosis not present

## 2016-11-05 DIAGNOSIS — J069 Acute upper respiratory infection, unspecified: Secondary | ICD-10-CM | POA: Diagnosis not present

## 2016-11-05 DIAGNOSIS — E1122 Type 2 diabetes mellitus with diabetic chronic kidney disease: Secondary | ICD-10-CM | POA: Diagnosis not present

## 2016-11-05 DIAGNOSIS — Z1231 Encounter for screening mammogram for malignant neoplasm of breast: Secondary | ICD-10-CM

## 2016-11-05 LAB — POCT GLYCOSYLATED HEMOGLOBIN (HGB A1C): Hemoglobin A1C: 6.9

## 2016-11-05 NOTE — Progress Notes (Signed)
Michelle Cummings is a 66 y.o. female here for an acute visit.  History of Present Illness:   URI   This is a new problem. The current episode started yesterday. The problem has been unchanged. There has been no fever. Associated symptoms include rhinorrhea, a sore throat and swollen glands. She has tried nothing for the symptoms.   PMHx, SurgHx, SocialHx, Medications, and Allergies were reviewed in the Visit Navigator and updated as appropriate.  Current Medications:   .  allopurinol (ZYLOPRIM) 100 MG tablet, Take 100 mg by mouth daily., Disp: , Rfl:  .  bimatoprost (LUMIGAN) 0.03 % ophthalmic solution, Place 1 drop into both eyes at bedtime., Disp: , Rfl:  .  carvedilol (COREG) 6.25 MG tablet, Take 6.25 mg by mouth 2 (two) times daily with a meal., Disp: , Rfl:  .  Cholecalciferol (VITAMIN D) 1000 UNITS capsule, Take 1,000 Units by mouth 2 (two) times daily., Disp: , Rfl:  .  ferrous sulfate 325 (65 FE) MG tablet, Take 1 tablet (325 mg total) by mouth daily with breakfast., Disp: 30 tablet, Rfl: 3 .  Insulin Pen Needle 31G X 6 MM MISC, Use one needle daily., Disp: 30 each, Rfl: 11 .  latanoprost (XALATAN) 0.005 % ophthalmic solution, place 1 drop into both eyes at bedtime, Disp: , Rfl:  .  liraglutide 18 MG/3ML SOPN, Inject 0.2 mLs (1.2 mg total) into the skin daily., Disp: 3 mL, Rfl: 3 .  rosuvastatin (CRESTOR) 10 MG tablet, Take 1 tablet (10 mg total) by mouth daily., Disp: 30 tablet, Rfl: 3 .  timolol (TIMOPTIC-XR) 0.5 % ophthalmic gel-forming, Place 1 drop into both eyes daily., Disp: , Rfl:  .  valsartan-hydrochlorothiazide (DIOVAN HCT) 160-25 MG tablet, Take 1 tablet by mouth daily., Disp: , Rfl:  .  vitamin C (ASCORBIC ACID) 500 MG tablet, Take 1,000 mg by mouth daily., Disp: , Rfl:  .  colchicine 0.6 MG tablet, Take 1 tablet (0.6 mg total) by mouth daily., Disp: 14 tablet, Rfl: 0   No Known Allergies   Review of Systems:   Pertinent items are noted in the HPI. Otherwise, ROS is  negative.  Vitals:   Vitals:   11/05/16 1137  BP: 138/84  Pulse: 82  Temp: (!) 97.5 F (36.4 C)  TempSrc: Oral  SpO2: 99%  Weight: 168 lb 6.4 oz (76.4 kg)  Height: 5\' 4"  (1.626 m)     Body mass index is 28.91 kg/m.   Physical Exam:   Physical Exam  Constitutional: She appears well-nourished.  HENT:  Head: Normocephalic and atraumatic.  Eyes: Pupils are equal, round, and reactive to light. EOM are normal.  Neck: Normal range of motion. Neck supple.  Cardiovascular: Normal rate, regular rhythm, normal heart sounds and intact distal pulses.   Pulmonary/Chest: Effort normal.  Abdominal: Soft.  Skin: Skin is warm.  Psychiatric: She has a normal mood and affect. Her behavior is normal.  Nursing note and vitals reviewed.   Results for orders placed or performed in visit on 11/05/16  POCT glycosylated hemoglobin (Hb A1C)  Result Value Ref Range   Hemoglobin A1C 6.9    Assessment and Plan:   Michelle Cummings was seen today for follow-up.  Diagnoses and all orders for this visit:  Type 2 diabetes mellitus with stage 3 chronic kidney disease, without long-term current use of insulin (HCC) -     POCT glycosylated hemoglobin (Hb A1C)  Viral upper respiratory tract infection Comments: Symptomatic care and red flags reviewed.   Marland Kitchen  Reviewed expectations re: course of current medical issues. . Discussed self-management of symptoms. . Outlined signs and symptoms indicating need for more acute intervention. . Patient verbalized understanding and all questions were answered. Marland Kitchen Health Maintenance issues including appropriate healthy diet, exercise, and smoking avoidance were discussed with patient. . See orders for this visit as documented in the electronic medical record. . Patient received an After Visit Summary.  Briscoe Deutscher, DO [place] 11/07/2016  Future Appointments Date Time Provider [place]  12/14/2016 11:00 AM [facility] DX DEXA 1 [facility]   12/14/2016 11:30 AM [facility] MM 2 [facility]  07/02/2017 10:00 AM Williemae Area, RN [facility] None

## 2016-11-30 ENCOUNTER — Telehealth: Payer: Self-pay | Admitting: Family Medicine

## 2016-11-30 MED ORDER — VALSARTAN-HYDROCHLOROTHIAZIDE 160-25 MG PO TABS: 1.0000 | ORAL_TABLET | Freq: Every day | ORAL | 5 refills | Status: DC

## 2016-11-30 NOTE — Telephone Encounter (Signed)
Okay refill. 

## 2016-11-30 NOTE — Telephone Encounter (Signed)
Please advise on refill. Historical provider.  

## 2016-11-30 NOTE — Telephone Encounter (Signed)
MEDICATION: valsartan-hydrochlorothiazide (DIOVAN HCT) 160-25 MG tablet  PHARMACY: [facility] [place], [place] [phone] (Phone) [phone] (Fax)       IS THIS A 90 DAY SUPPLY : yes  IS PATIENT OUT OF MEDICATION: n  IF NOT; HOW MUCH IS LEFT: 2  LAST APPOINTMENT DATE: @11 /01/2016  NEXT APPOINTMENT DATE:@Visit  date not found  OTHER COMMENTS:    **Let patient know to contact pharmacy at the end of the day to make sure medication is ready. **  ** Please notify patient to allow 48-72 hours to process**  **Encourage patient to contact the pharmacy for refills or they can request refills through [facility]**

## 2016-11-30 NOTE — Telephone Encounter (Signed)
RX sent to the pharmacy  

## 2016-12-14 ENCOUNTER — Inpatient Hospital Stay: Admission: RE | Admit: 2016-12-14 | Payer: Self-pay | Source: Ambulatory Visit

## 2016-12-14 ENCOUNTER — Ambulatory Visit: Payer: Self-pay

## 2017-01-04 ENCOUNTER — Other Ambulatory Visit: Payer: Self-pay

## 2017-01-12 ENCOUNTER — Ambulatory Visit
Admission: RE | Admit: 2017-01-12 | Discharge: 2017-01-12 | Disposition: A | Discharge status: Home / Self Care | Payer: Medicare Other | Source: Ambulatory Visit | Attending: Family Medicine | Admitting: Family Medicine

## 2017-01-12 DIAGNOSIS — Z1231 Encounter for screening mammogram for malignant neoplasm of breast: Secondary | ICD-10-CM

## 2017-01-22 ENCOUNTER — Ambulatory Visit
Admission: RE | Admit: 2017-01-22 | Discharge: 2017-01-22 | Disposition: A | Discharge status: Home / Self Care | Payer: Medicare Other | Source: Ambulatory Visit | Attending: Family Medicine | Admitting: Family Medicine

## 2017-01-22 DIAGNOSIS — E2839 Other primary ovarian failure: Secondary | ICD-10-CM

## 2017-02-17 ENCOUNTER — Other Ambulatory Visit: Payer: Self-pay | Admitting: Family Medicine

## 2017-02-17 DIAGNOSIS — M10342 Gout due to renal impairment, left hand: Secondary | ICD-10-CM

## 2017-04-22 ENCOUNTER — Other Ambulatory Visit: Payer: Self-pay | Admitting: Family Medicine

## 2017-04-22 DIAGNOSIS — N183 Chronic kidney disease, stage 3 unspecified: Secondary | ICD-10-CM

## 2017-04-22 DIAGNOSIS — E1122 Type 2 diabetes mellitus with diabetic chronic kidney disease: Secondary | ICD-10-CM

## 2017-05-04 ENCOUNTER — Encounter (HOSPITAL_COMMUNITY): Payer: Self-pay | Admitting: Emergency Medicine

## 2017-05-04 ENCOUNTER — Emergency Department (HOSPITAL_COMMUNITY)
Admission: EM | Admit: 2017-05-04 | Discharge: 2017-05-04 | Disposition: A | Discharge status: Home / Self Care | Payer: Medicare Other | Attending: Emergency Medicine | Admitting: Emergency Medicine

## 2017-05-04 ENCOUNTER — Emergency Department (HOSPITAL_COMMUNITY): Payer: Medicare Other

## 2017-05-04 DIAGNOSIS — I700 Atherosclerosis of aorta: Secondary | ICD-10-CM

## 2017-05-04 DIAGNOSIS — I129 Hypertensive chronic kidney disease with stage 1 through stage 4 chronic kidney disease, or unspecified chronic kidney disease: Secondary | ICD-10-CM | POA: Diagnosis not present

## 2017-05-04 DIAGNOSIS — Z794 Long term (current) use of insulin: Secondary | ICD-10-CM | POA: Insufficient documentation

## 2017-05-04 DIAGNOSIS — R109 Unspecified abdominal pain: Secondary | ICD-10-CM | POA: Diagnosis present

## 2017-05-04 DIAGNOSIS — N183 Chronic kidney disease, stage 3 (moderate): Secondary | ICD-10-CM | POA: Diagnosis not present

## 2017-05-04 DIAGNOSIS — Y92410 Unspecified street and highway as the place of occurrence of the external cause: Secondary | ICD-10-CM | POA: Diagnosis not present

## 2017-05-04 DIAGNOSIS — Z79899 Other long term (current) drug therapy: Secondary | ICD-10-CM | POA: Insufficient documentation

## 2017-05-04 DIAGNOSIS — E1122 Type 2 diabetes mellitus with diabetic chronic kidney disease: Secondary | ICD-10-CM | POA: Diagnosis not present

## 2017-05-04 DIAGNOSIS — I2584 Coronary atherosclerosis due to calcified coronary lesion: Secondary | ICD-10-CM

## 2017-05-04 DIAGNOSIS — I2510 Atherosclerotic heart disease of native coronary artery without angina pectoris: Secondary | ICD-10-CM | POA: Diagnosis not present

## 2017-05-04 LAB — CBC
HCT: 36.4 % (ref 36.0–46.0)
Hemoglobin: 11.7 g/dL — ABNORMAL LOW (ref 12.0–15.0)
MCH: 27.4 pg (ref 26.0–34.0)
MCHC: 32.1 g/dL (ref 30.0–36.0)
MCV: 85.2 fL (ref 78.0–100.0)
Platelets: 345 10*3/uL (ref 150–400)
RBC: 4.27 MIL/uL (ref 3.87–5.11)
RDW: 13.5 % (ref 11.5–15.5)
WBC: 11 10*3/uL — ABNORMAL HIGH (ref 4.0–10.5)

## 2017-05-04 LAB — URINALYSIS, ROUTINE W REFLEX MICROSCOPIC
Bacteria, UA: NONE SEEN
Bilirubin Urine: NEGATIVE
Glucose, UA: NEGATIVE mg/dL
Hgb urine dipstick: NEGATIVE
Ketones, ur: NEGATIVE mg/dL
Nitrite: NEGATIVE
Protein, ur: 100 mg/dL — AB
Specific Gravity, Urine: 1.004 — ABNORMAL LOW (ref 1.005–1.030)
pH: 5 (ref 5.0–8.0)

## 2017-05-04 LAB — BASIC METABOLIC PANEL
Anion gap: 11 (ref 5–15)
BUN: 48 mg/dL — ABNORMAL HIGH (ref 6–20)
CO2: 20 mmol/L — ABNORMAL LOW (ref 22–32)
Calcium: 9.5 mg/dL (ref 8.9–10.3)
Chloride: 105 mmol/L (ref 101–111)
Creatinine, Ser: 1.65 mg/dL — ABNORMAL HIGH (ref 0.44–1.00)
GFR calc Af Amer: 36 mL/min — ABNORMAL LOW (ref >60)
GFR calc non Af Amer: 31 mL/min — ABNORMAL LOW (ref >60)
Glucose, Bld: 127 mg/dL — ABNORMAL HIGH (ref 65–99)
Potassium: 4.8 mmol/L (ref 3.5–5.1)
Sodium: 136 mmol/L (ref 135–145)

## 2017-05-04 LAB — CBG MONITORING, ED: Glucose-Capillary: 96 mg/dL (ref 65–99)

## 2017-05-04 MED ORDER — ACETAMINOPHEN 325 MG PO TABS
650.0000 mg | ORAL_TABLET | Freq: Once | ORAL | Status: AC
  Administered 2017-05-04: 650 mg via ORAL
  Filled 2017-05-04: qty 2

## 2017-05-04 MED ORDER — LIDOCAINE 5 % EX PTCH: 1.0000 | MEDICATED_PATCH | CUTANEOUS | 0 refills | Status: DC
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
## 2017-05-19 ENCOUNTER — Telehealth: Payer: Self-pay

## 2017-05-19 NOTE — Telephone Encounter (Signed)
Per [employer], PA not required for Victoza.  Drug is on formulary. Patient's pharmacy notified.

## 2017-07-02 ENCOUNTER — Ambulatory Visit: Payer: Medicare Other | Admitting: *Deleted

## 2017-08-04 ENCOUNTER — Other Ambulatory Visit: Payer: Self-pay

## 2017-08-04 ENCOUNTER — Emergency Department (HOSPITAL_COMMUNITY): Payer: Medicare Other

## 2017-08-04 ENCOUNTER — Encounter (HOSPITAL_COMMUNITY): Payer: Self-pay | Admitting: *Deleted

## 2017-08-04 ENCOUNTER — Emergency Department (HOSPITAL_COMMUNITY)
Admission: EM | Admit: 2017-08-04 | Discharge: 2017-08-04 | Disposition: A | Discharge status: Home / Self Care | Payer: Medicare Other | Attending: Emergency Medicine | Admitting: Emergency Medicine

## 2017-08-04 DIAGNOSIS — N183 Chronic kidney disease, stage 3 (moderate): Secondary | ICD-10-CM | POA: Diagnosis not present

## 2017-08-04 DIAGNOSIS — R55 Syncope and collapse: Secondary | ICD-10-CM | POA: Insufficient documentation

## 2017-08-04 DIAGNOSIS — I129 Hypertensive chronic kidney disease with stage 1 through stage 4 chronic kidney disease, or unspecified chronic kidney disease: Secondary | ICD-10-CM | POA: Insufficient documentation

## 2017-08-04 DIAGNOSIS — Z79899 Other long term (current) drug therapy: Secondary | ICD-10-CM | POA: Diagnosis not present

## 2017-08-04 DIAGNOSIS — E1122 Type 2 diabetes mellitus with diabetic chronic kidney disease: Secondary | ICD-10-CM | POA: Insufficient documentation

## 2017-08-04 LAB — CBC WITH DIFFERENTIAL/PLATELET
Abs Immature Granulocytes: 0 10*3/uL (ref 0.0–0.1)
Basophils Absolute: 0.1 10*3/uL (ref 0.0–0.1)
Basophils Relative: 1 %
Eosinophils Absolute: 0.2 10*3/uL (ref 0.0–0.7)
Eosinophils Relative: 2 %
HCT: 35.9 % — ABNORMAL LOW (ref 36.0–46.0)
Hemoglobin: 11.1 g/dL — ABNORMAL LOW (ref 12.0–15.0)
Immature Granulocytes: 0 %
Lymphocytes Relative: 14 %
Lymphs Abs: 1.4 10*3/uL (ref 0.7–4.0)
MCH: 27.1 pg (ref 26.0–34.0)
MCHC: 30.9 g/dL (ref 30.0–36.0)
MCV: 87.6 fL (ref 78.0–100.0)
Monocytes Absolute: 0.5 10*3/uL (ref 0.1–1.0)
Monocytes Relative: 5 %
Neutro Abs: 7.6 10*3/uL (ref 1.7–7.7)
Neutrophils Relative %: 78 %
Platelets: 349 10*3/uL (ref 150–400)
RBC: 4.1 MIL/uL (ref 3.87–5.11)
RDW: 12.5 % (ref 11.5–15.5)
WBC: 9.7 10*3/uL (ref 4.0–10.5)

## 2017-08-04 LAB — COMPREHENSIVE METABOLIC PANEL
ALT: 21 U/L (ref 0–44)
AST: 20 U/L (ref 15–41)
Albumin: 3.9 g/dL (ref 3.5–5.0)
Alkaline Phosphatase: 77 U/L (ref 38–126)
Anion gap: 11 (ref 5–15)
BUN: 47 mg/dL — ABNORMAL HIGH (ref 8–23)
CO2: 24 mmol/L (ref 22–32)
Calcium: 9.5 mg/dL (ref 8.9–10.3)
Chloride: 97 mmol/L — ABNORMAL LOW (ref 98–111)
Creatinine, Ser: 2.07 mg/dL — ABNORMAL HIGH (ref 0.44–1.00)
GFR calc Af Amer: 27 mL/min — ABNORMAL LOW (ref >60)
GFR calc non Af Amer: 23 mL/min — ABNORMAL LOW (ref >60)
Glucose, Bld: 213 mg/dL — ABNORMAL HIGH (ref 70–99)
Potassium: 4.2 mmol/L (ref 3.5–5.1)
Sodium: 132 mmol/L — ABNORMAL LOW (ref 135–145)
Total Bilirubin: 0.6 mg/dL (ref 0.3–1.2)
Total Protein: 7.3 g/dL (ref 6.5–8.1)

## 2017-08-04 LAB — URINALYSIS, ROUTINE W REFLEX MICROSCOPIC
Bilirubin Urine: NEGATIVE
Glucose, UA: NEGATIVE mg/dL
Ketones, ur: NEGATIVE mg/dL
Nitrite: NEGATIVE
Protein, ur: 100 mg/dL — AB
Specific Gravity, Urine: 1.008 (ref 1.005–1.030)
pH: 5 (ref 5.0–8.0)

## 2017-08-04 LAB — I-STAT TROPONIN, ED: Troponin i, poc: 0 ng/mL (ref 0.00–0.08)

## 2017-08-04 LAB — LIPASE, BLOOD: Lipase: 92 U/L — ABNORMAL HIGH (ref 11–51)

## 2017-08-04 MED ORDER — SODIUM CHLORIDE 0.9 % IV BOLUS
1000.0000 mL | Freq: Once | INTRAVENOUS | Status: AC
  Administered 2017-08-04: 1000 mL via INTRAVENOUS

## 2017-08-04 NOTE — ED Notes (Signed)
ED Provider at bedside. 

## 2017-08-04 NOTE — ED Provider Notes (Signed)
[facility] EMERGENCY DEPARTMENT Provider Note   CSN: [id number] Arrival date & time: 08/04/17  1404     History   Chief Complaint Chief Complaint  Patient presents with  . Near Syncope    HPI Michelle Cummings is a 67 y.o. female.  Patient is a 67 year old female with a history of diabetes, hypertension, hyperlipidemia, chronic kidney disease stage II presenting today after a syncopal episode while she was at church.  Patient states she went out walking this morning and was her normal self.  She had some breakfast prior to going for her walk and then was at church for luncheon.  She states that she started to feel like her blood sugar might be dropping she felt a little woozy and not well so sat down and ate some mashed potatoes and the next thing she remembers is waking up on the floor.  Per bystanders witnesses patient lost consciousness for approximately 20 seconds but they helped her to the floor and she did not fall.  Patient immediately was questioning what happened she denied any chest pain before or after the event or palpitations.  When her blood sugar was checked there by her nurse it was in the 100s and her blood pressure was elevated.  She has not had any recent changes in her medication except for Monday and Tuesday she took a pain pill because she recently had a dental traction but has not had any infectious symptoms, vomiting or diarrhea.  The history is provided by the patient.  Loss of Consciousness   This is a new problem. The current episode started 1 to 2 hours ago. The problem occurs constantly. The problem has been resolved. She lost consciousness for a period of less than one minute. The problem is associated with normal activity. Associated symptoms include light-headedness. Pertinent negatives include abdominal pain, back pain, chest pain, diaphoresis, dizziness, headaches, palpitations, seizures, slurred speech and weakness. Associated symptoms comments:  After waking up she has had burping and feeling a catch in her right side.. She has tried sugar/glucose for the symptoms. The treatment provided significant relief. Her past medical history is significant for DM. Past medical history comments: CKD.    Past Medical History:  Diagnosis Date  . Arthritis   . Cataracts, bilateral   . Cervical disc disease   . Chronic kidney disease    Dr. Hillery Hunter yearly for CKD stage II as of 11/26/11  . Diabetes mellitus ([place])   . Eczema   . Glaucoma   . Heart murmur   . History of chicken pox   . Hyperlipidemia   . Hypertension   . Seasonal allergies   . Thyroid nodule     Patient Active Problem List   Diagnosis Date Noted  . Gout 07/02/2016  . Anemia of chronic disease 06/07/2016  . HLD (hyperlipidemia) 06/02/2016  . Glaucoma 06/02/2016  . Chronic kidney disease, stage 3, mod decreased GFR (HCC) 06/02/2016  . HTN (hypertension) 03/05/2011  . Diabetes mellitus ([place]) 03/05/2011    Past Surgical History:  Procedure Laterality Date  . [place]  . COLONOSCOPY    . ORIF ANKLE FRACTURE  11/05/2011   Procedure: OPEN REDUCTION INTERNAL FIXATION (ORIF) ANKLE FRACTURE;  Surgeon: Wylene Simmer, MD;  Location: [facility];  Service: Orthopedics;  Laterality: Left;  OPEN REDUCTION INTERNAL FIXATION LEFT LATERAL MALLEOLUS FRACTURE WITH STRESS XRAYS WITH FLOUROSCOPY  . ORIF ELBOW FRACTURE  03/05/2011   Procedure: OPEN REDUCTION INTERNAL  FIXATION (ORIF) ELBOW/OLECRANON FRACTURE;  Surgeon: Linna Hoff, MD;  Location: [employer] [facility];  Service: Orthopedics;  Laterality: Left;  . THYROID LOBECTOMY Right 07/20/2012  . THYROIDECTOMY Right 07/20/2012   Procedure: RIGHT THYROID LOBECTOMY WITH ISTHMUSECTOMY;  Surgeon: Izora Gala, MD;  Location: [facility];  Service: ENT;  Laterality: Right;  . TONSILLECTOMY    . TUBAL LIGATION       OB History    Gravida  2   Para  2   Term      Preterm      AB      Living        SAB      TAB       Ectopic      Multiple      Live Births               Home Medications    Prior to Admission medications   Medication Sig Start Date End Date Taking? Authorizing Provider  carvedilol (COREG) 6.25 MG tablet Take 6.25 mg by mouth 2 (two) times daily with a meal.    [provider]  Cholecalciferol (VITAMIN D) 1000 UNITS capsule Take 1,000 Units by mouth 2 (two) times daily.    [provider]  colchicine 0.6 MG tablet TAKE 1 TABLET BY MOUTH ONCE DAILY 02/18/17   Briscoe Deutscher, DO  ferrous sulfate 325 (65 FE) MG tablet Take 1 tablet (325 mg total) by mouth daily with breakfast. 07/02/16   Briscoe Deutscher, DO  Insulin Pen Needle 31G X 6 MM MISC Use one needle daily. 10/22/16   Briscoe Deutscher, DO  latanoprost (XALATAN) 0.005 % ophthalmic solution place 1 drop into both eyes at bedtime 09/01/16   [provider]  lidocaine (LIDODERM) 5 % Place 1 patch onto the skin daily. Remove & Discard patch within 12 hours or as directed by MD 05/04/17   Langston Masker B, PA-C  rosuvastatin (CRESTOR) 10 MG tablet Take 1 tablet (10 mg total) by mouth daily. 07/01/16   Briscoe Deutscher, DO  timolol (TIMOPTIC-XR) 0.5 % ophthalmic gel-forming Place 1 drop into both eyes daily.    [provider]  valsartan-hydrochlorothiazide (DIOVAN HCT) 160-25 MG tablet Take 1 tablet by mouth daily. 11/30/16   Briscoe Deutscher, DO  VICTOZA 18 MG/3ML SOPN INJECT 1.2 MILLIGRAM SUBCUTANEOUSLY DAILY 04/26/17   Briscoe Deutscher, DO  vitamin C (ASCORBIC ACID) 500 MG tablet Take 1,000 mg by mouth daily.    [provider]    Family History No family history on file.  Social History Social History   Tobacco Use  . Smoking status: Never Smoker  . Smokeless tobacco: Never Used  Substance Use Topics  . Alcohol use: No  . Drug use: No     Allergies   Patient has no known allergies.   Review of Systems Review of Systems  Constitutional: Negative for diaphoresis.  Cardiovascular:  Positive for syncope. Negative for chest pain and palpitations.  Gastrointestinal: Negative for abdominal pain.  Musculoskeletal: Negative for back pain.  Neurological: Positive for light-headedness. Negative for dizziness, seizures, weakness and headaches.  All other systems reviewed and are negative.    Physical Exam Updated Vital Signs BP (!) 173/72 (BP Location: Left Arm)   Pulse 79   Temp 97.7 F (36.5 C) (Oral)   Resp 14   Ht 5\' 4"  (1.626 m)   Wt 74.8 kg (165 lb)   SpO2 100%   BMI 28.32 kg/m   Physical Exam  Constitutional: She is oriented to person, place, and time. She appears well-developed and well-nourished. No distress.  HENT:  Head: Normocephalic and atraumatic.  Eyes: Pupils are equal, round, and reactive to light. EOM are normal.  Cardiovascular: Normal rate, regular rhythm and intact distal pulses. Exam reveals no friction rub.  Murmur heard.  Systolic murmur is present with a grade of 2/6. Pulmonary/Chest: Effort normal and breath sounds normal. She has no wheezes. She has no rales.  Abdominal: Soft. Bowel sounds are normal. She exhibits no distension. There is no tenderness. There is no rebound and no guarding.  Musculoskeletal: Normal range of motion. She exhibits no tenderness.  No edema  Neurological: She is alert and oriented to person, place, and time. No cranial nerve deficit.  Skin: Skin is warm and dry. No rash noted.  Psychiatric: She has a normal mood and affect. Her behavior is normal.  Nursing note and vitals reviewed.    ED Treatments / Results  Labs (all labs ordered are listed, but only abnormal results are displayed) Labs Reviewed  CBC WITH DIFFERENTIAL/PLATELET - Abnormal; Notable for the following components:      Result Value   Hemoglobin 11.1 (*)    HCT 35.9 (*)    All other components within normal limits  COMPREHENSIVE METABOLIC PANEL - Abnormal; Notable for the following components:   Sodium 132 (*)    Chloride 97 (*)     Glucose, Bld 213 (*)    BUN 47 (*)    Creatinine, Ser 2.07 (*)    GFR calc non Af Amer 23 (*)    GFR calc Af Amer 27 (*)    All other components within normal limits  URINALYSIS, ROUTINE W REFLEX MICROSCOPIC - Abnormal; Notable for the following components:   Color, Urine STRAW (*)    Hgb urine dipstick SMALL (*)    Protein, ur 100 (*)    Leukocytes, UA TRACE (*)    Bacteria, UA RARE (*)    All other components within normal limits  LIPASE, BLOOD - Abnormal; Notable for the following components:   Lipase 92 (*)    All other components within normal limits  I-STAT TROPONIN, ED    EKG EKG Interpretation  Date/Time:  Wednesday August 04 2017 14:09:44 EDT Ventricular Rate:  77 PR Interval:  204 QRS Duration: 86 QT Interval:  406 QTC Calculation: 459 R Axis:   -3 Text Interpretation:  Normal sinus rhythm Possible Left atrial enlargement Left ventricular hypertrophy Cannot rule out Septal infarct , age undetermined No significant change since last tracing Confirmed by Blanchie Dessert [phone]) on 08/04/2017 4:01:52 PM   Radiology Dg Chest 2 View  Result Date: 08/04/2017 CLINICAL DATA:  Upper abdominal pain EXAM: CHEST - 2 VIEW COMPARISON:  CT chest 05/04/2017, radiograph 07/13/2012 FINDINGS: No acute airspace disease or effusion. Stable cardiomediastinal silhouette. No pneumothorax. Chronic elevation distal right clavicle. Degenerative changes of the spine. IMPRESSION: No active cardiopulmonary disease. Electronically Signed   By: Donavan Foil M.D.   On: 08/04/2017 15:17    Procedures Procedures (including critical care time)  Medications Ordered in ED Medications  sodium chloride 0.9 % bolus 1,000 mL (1,000 mLs Intravenous New Bag/Given 08/04/17 1609)     Initial Impression / Assessment and Plan / ED Course  I have reviewed the triage vital signs and the nursing notes.  Pertinent labs & imaging results that were available during my care of the patient were reviewed by me and  considered in my medical decision  making (see chart for details).     Patient is a well-appearing 67 year old female with an episode of syncope today.  Patient is well-appearing here and in no acute distress.  Vital signs are reassuring except for mild hypertension but she states she has not taken 1 of her blood pressure medications today.  Patient denied any cardiac symptoms before or after the event and EKG without acute findings.  Patient's chest x-ray is within normal limits.  Patient has no focal abdominal pain but does have some mild discomfort on her right side but she states started after the fall and she thinks she must of strained something.  Low suspicion for aortic dissection or AAA rupture.  Low suspicion for this being a vagal event.  Suspect most likely related to orthostasis and dehydration as patient's creatinine is also elevated from her baseline from 1.6-2 today.  Low suspicion for GI bleed and patient is not displaying new anemia.  Patient's troponin and EKG without acute findings.  Findings discussed with the patient and her spouse.  Will give fluid and ambulate the patient to make sure she is feeling okay.  5:24 PM Patient finished fluids she was able to ambulate here without any difficulty.  She has follow-up with her PCP tomorrow and will let her know about her mildly elevated creatinine.  Final Clinical Impressions(s) / ED Diagnoses   Final diagnoses:  Syncope, unspecified syncope type    ED Discharge Orders    None       Blanchie Dessert, MD 08/04/17 1724

## 2017-08-04 NOTE — ED Notes (Signed)
Patient verbalizes understanding of discharge instructions. Opportunity for questioning and answers were provided. Armband removed by staff, pt discharged from ED.  

## 2017-08-04 NOTE — ED Triage Notes (Signed)
Pt arrived via EMS from [place] after a syncope episode . Pt was at church serving lunch . Pt reported feeling hot just before syncope. Witness reported Pt was out for 20 sec. Marland Kitchen Pt now reports ABD tightness and burping. Pt AO on arrival to ED.

## 2017-08-04 NOTE — ED Notes (Signed)
Pt ambulated with minimal/no assistance to bathroom. Tolerated well.

## 2017-08-04 NOTE — ED Provider Notes (Signed)
Patient placed in Quick Look pathway, seen and evaluated   Chief Complaint: syncope  HPI:   Pt is a 67 y.o. female with a PMHx of HLD, HTN, DM2, CKD, and other medical conditions, presenting today with c/o syncopal event; states she felt dizzy and closed her eyes, her friend caught her so she didn't fall, but she blacked out for a few seconds. States the room was hot. No CP or SOB. Has some RUQ abd pain and burping today. Had meatball with cheesy potato earlier. No nausea or vomiting. No HA or vision changes.   ROS: +Syncope and RUQ pain. No n/v, CP, SOB, HA, vision changes  Physical Exam:  BP (!) 173/72 (BP Location: Left Arm)   Pulse 79   Temp 97.7 F (36.5 C) (Oral)   Resp 14   SpO2 100%    Gen: No distress  Neuro: Awake and Alert  Skin: Warm    Focused Exam: Cardio: RRR, nl s1/s2, no m/r/g appreciated. Lungs: CTAB in all lung fields, no w/r/r, no hypoxia or increased WOB, speaking in full sentences, SpO2 100% on RA. Abd: Soft, nondistended, +BS throughout, with very mild RUQ/R lateral abd TTP, no r/g/r, neg murphy's, neg mcburney's, no CVA TTP    Initiation of care has begun. The patient has been counseled on the process, plan, and necessity for staying for the completion/evaluation, and the remainder of the medical screening examination     [address] Tashyra Adduci, [place], [place] 08/04/17 1430    Julianne Rice, MD 08/07/17 [phone]

## 2017-08-06 ENCOUNTER — Other Ambulatory Visit: Payer: Self-pay | Admitting: Family Medicine

## 2017-10-07 ENCOUNTER — Encounter: Payer: Self-pay | Admitting: Internal Medicine

## 2017-10-07 ENCOUNTER — Ambulatory Visit: Payer: Medicare Other | Admitting: Internal Medicine

## 2017-10-07 VITALS — BP 130/74 | HR 73 | Temp 97.9°F | Wt 160.8 lb

## 2017-10-07 DIAGNOSIS — E1122 Type 2 diabetes mellitus with diabetic chronic kidney disease: Secondary | ICD-10-CM | POA: Diagnosis not present

## 2017-10-07 DIAGNOSIS — N183 Chronic kidney disease, stage 3 unspecified: Secondary | ICD-10-CM

## 2017-10-07 DIAGNOSIS — G629 Polyneuropathy, unspecified: Secondary | ICD-10-CM

## 2017-10-07 DIAGNOSIS — N08 Glomerular disorders in diseases classified elsewhere: Secondary | ICD-10-CM

## 2017-10-07 DIAGNOSIS — I129 Hypertensive chronic kidney disease with stage 1 through stage 4 chronic kidney disease, or unspecified chronic kidney disease: Secondary | ICD-10-CM | POA: Diagnosis not present

## 2017-10-07 DIAGNOSIS — M25552 Pain in left hip: Secondary | ICD-10-CM

## 2017-10-07 LAB — LIPID PANEL
Chol/HDL Ratio: 3.5 ratio (ref 0.0–4.4)
Cholesterol, Total: 209 mg/dL — ABNORMAL HIGH (ref 100–199)
HDL: 60 mg/dL (ref >39)
LDL Calculated: 130 mg/dL — ABNORMAL HIGH (ref 0–99)
Triglycerides: 95 mg/dL (ref 0–149)
VLDL Cholesterol Cal: 19 mg/dL (ref 5–40)

## 2017-10-07 LAB — CMP14+EGFR
ALT: 16 IU/L (ref 0–32)
AST: 15 IU/L (ref 0–40)
Albumin/Globulin Ratio: 1.6 (ref 1.2–2.2)
Albumin: 4.5 g/dL (ref 3.6–4.8)
Alkaline Phosphatase: 102 IU/L (ref 39–117)
BUN/Creatinine Ratio: 23 (ref 12–28)
BUN: 39 mg/dL — ABNORMAL HIGH (ref 8–27)
Bilirubin Total: 0.3 mg/dL (ref 0.0–1.2)
CO2: 22 mmol/L (ref 20–29)
Calcium: 10.4 mg/dL — ABNORMAL HIGH (ref 8.7–10.3)
Chloride: 93 mmol/L — ABNORMAL LOW (ref 96–106)
Creatinine, Ser: 1.68 mg/dL — ABNORMAL HIGH (ref 0.57–1.00)
GFR calc Af Amer: 36 mL/min/{1.73_m2} — ABNORMAL LOW (ref >59)
GFR calc non Af Amer: 31 mL/min/{1.73_m2} — ABNORMAL LOW (ref >59)
Globulin, Total: 2.8 g/dL (ref 1.5–4.5)
Glucose: 101 mg/dL — ABNORMAL HIGH (ref 65–99)
Potassium: 5 mmol/L (ref 3.5–5.2)
Sodium: 132 mmol/L — ABNORMAL LOW (ref 134–144)
Total Protein: 7.3 g/dL (ref 6.0–8.5)

## 2017-10-07 LAB — HEMOGLOBIN A1C
Est. average glucose Bld gHb Est-mCnc: 163 mg/dL
Hgb A1c MFr Bld: 7.3 % — ABNORMAL HIGH (ref 4.8–5.6)

## 2017-10-07 NOTE — Progress Notes (Addendum)
Subjective:     Patient ID: Michelle Cummings , female    DOB: [birth year] , 67 y.o.   MRN: [id number]   Hip Pain   The incident occurred 5 to 7 days ago. There was no injury mechanism. The pain is present in the left hip. The quality of the pain is described as aching. The pain is at a severity of 5/10. The pain is moderate. The pain has been fluctuating since onset. She reports no foreign bodies present. The symptoms are aggravated by movement.  Diabetes  She presents for her follow-up diabetic visit. She has type 2 diabetes mellitus. Her disease course has been worsening. There are no hypoglycemic associated symptoms. Pertinent negatives for diabetes include no blurred vision, no chest pain and no weakness. There are no hypoglycemic complications. Diabetic complications include nephropathy. Risk factors for coronary artery disease include hypertension, diabetes mellitus, sedentary lifestyle and dyslipidemia. Her home blood glucose trend is increasing steadily. Her breakfast blood glucose is taken between 9-10 am. Her breakfast blood glucose range is generally 140-180 mg/dl.  Hypertension  This is a chronic problem. The current episode started more than 1 year ago. Pertinent negatives include no blurred vision or chest pain.   SHE REPORTS COMPLIANCE WITH MEDS; HOWEVER, ADMITS SHE HAS YET TO TAKE HER MEDS TODAY.   Past Medical History:  Diagnosis Date  . Arthritis   . Cataracts, bilateral   . Cervical disc disease   . Chronic kidney disease    Dr. Hillery Hunter yearly for CKD stage II as of 11/26/11  . Diabetes mellitus ([place])   . Eczema   . Glaucoma   . Heart murmur   . History of chicken pox   . Hyperlipidemia   . Hypertension   . Seasonal allergies   . Thyroid nodule       Current Outpatient Medications:  .  carvedilol (COREG) 6.25 MG tablet, Take 6.25 mg by mouth 2 (two) times daily with a meal., Disp: , Rfl:  .  Cholecalciferol (VITAMIN D) 1000 UNITS capsule, Take 1,000 Units by mouth  2 (two) times daily., Disp: , Rfl:  .  colchicine 0.6 MG tablet, TAKE 1 TABLET BY MOUTH ONCE DAILY, Disp: 14 tablet, Rfl: 0 .  ferrous sulfate 325 (65 FE) MG tablet, Take 1 tablet (325 mg total) by mouth daily with breakfast., Disp: 30 tablet, Rfl: 3 .  Insulin Pen Needle 31G X 6 MM MISC, Use one needle daily., Disp: 30 each, Rfl: 11 .  latanoprost (XALATAN) 0.005 % ophthalmic solution, place 1 drop into both eyes at bedtime, Disp: , Rfl:  .  lidocaine (LIDODERM) 5 %, Place 1 patch onto the skin daily. Remove & Discard patch within 12 hours or as directed by MD, Disp: 30 patch, Rfl: 0 .  rosuvastatin (CRESTOR) 10 MG tablet, Take 1 tablet (10 mg total) by mouth daily., Disp: 30 tablet, Rfl: 3 .  timolol (TIMOPTIC-XR) 0.5 % ophthalmic gel-forming, Place 1 drop into both eyes daily., Disp: , Rfl:  .  valsartan-hydrochlorothiazide (DIOVAN-HCT) 80-12.5 MG tablet, Take 1 tablet by mouth daily., Disp: , Rfl:  .  VICTOZA 18 MG/3ML SOPN, INJECT 1.2 MILLIGRAM SUBCUTANEOUSLY DAILY, Disp: 18 mL, Rfl: 2 .  ONETOUCH DELICA LANCETS FINE MISC, 2 (two) times daily. for testing, Disp: , Rfl: 5 .  vitamin C (ASCORBIC ACID) 500 MG tablet, Take 1,000 mg by mouth daily., Disp: , Rfl:    Review of Systems  Constitutional: Negative.   HENT: Negative.   Eyes:  Negative for blurred vision.  Respiratory: Negative.   Cardiovascular: Negative.  Negative for chest pain.  Gastrointestinal: Negative.   Musculoskeletal: Positive for arthralgias.  Neurological: Negative for weakness.     Today's Vitals   10/07/17 1008  BP: 130/74  Pulse: 73  Temp: 97.9 F (36.6 C)  TempSrc: Oral  Weight: 160 lb 12.8 oz (72.9 kg)  PainSc: 6   PainLoc: Leg   Body mass index is 27.6 kg/m.   Objective:  Physical Exam  Constitutional: She appears well-developed and well-nourished.  Neck: Normal range of motion. Neck supple.  Cardiovascular: Normal rate and regular rhythm.  Pulmonary/Chest: Effort normal and breath sounds normal.    Musculoskeletal:       Left hip: She exhibits tenderness and bony tenderness.       Legs: Skin: Skin is warm and dry.  Psychiatric: She has a normal mood and affect.        Assessment And Plan:     1. Left hip pain I WILL SEND HER FOR L HIP XRAY.   2. Type 2 diabetes mellitus with stage 3 chronic kidney disease, without long-term current use of insulin (HCC) STOP KOMBIGLYZE. STOP VICTOZA (PRESCRIBED BY SOMEONE ELSE). START OZEMPIC, 0.25MG ONCE WEEKLY. WE DISCUSSED STARTING OZEMPIC TO HELP HER ACHIEVE GLYCEMIC CONTROL. SHE WILL START 0.25MG ONCE WEEKLY X 4 WEEKS, THEN 0.5MG ONCE WEEKLY X 2 WEEKS. SHE DENIES FAMILY HISTORY OF THYROID CANCER. SHE WILL RTO IN SIX WEEKS FOR RE-EVALUATION.  MOST COMMON SIDE EFFECTS INCLUDING NAUSEA, DIARRHEA AND CONSTIPATION WERE DISCUSSED WITH THE PATIENT.   - CMP14+EGFR - Hemoglobin A1c - Lipid Profile  3. Chronic kidney disease, stage III (moderate) (HCC) CHRONIC. SHE IS ENCOURAGED TO STAY WELL HYDRATED AND TO KEEP BP/BS WELL CONTROLLED.   4. Nephropathy associated with another disease CHRONIC.     5. Hypertensive nephropathy CONTROLLED. SHE WILL CONTINUE WITH CURRENT MEDS FOR NOW. SHE IS ENCOURAGED TO AVOID ADDING SALT TO HER FOODS. SHE IS REMINDED THAT PACKAGED FOODS TEND TO BE HIGHER IN SODIUM AS WELL.         [name], MD

## 2017-10-08 ENCOUNTER — Other Ambulatory Visit: Payer: Self-pay | Admitting: Internal Medicine

## 2017-10-08 ENCOUNTER — Ambulatory Visit
Admission: RE | Admit: 2017-10-08 | Discharge: 2017-10-08 | Disposition: A | Discharge status: Home / Self Care | Payer: Medicare Other | Source: Ambulatory Visit | Attending: Internal Medicine | Admitting: Internal Medicine

## 2017-10-08 DIAGNOSIS — M25552 Pain in left hip: Secondary | ICD-10-CM

## 2017-10-10 ENCOUNTER — Encounter: Payer: Self-pay | Admitting: Internal Medicine

## 2017-10-10 NOTE — Progress Notes (Signed)
Xray results: no evidence of hip fracture. She also has a calcified fibroid as well. No need for further evaluation. Did you take the muscle relaxer? If yes, did it help?   Take care,   Sincerely,    Jermika [facility], MD

## 2017-10-12 ENCOUNTER — Other Ambulatory Visit: Payer: Self-pay

## 2017-10-12 DIAGNOSIS — M25552 Pain in left hip: Secondary | ICD-10-CM

## 2017-10-12 NOTE — Progress Notes (Unsigned)
Patient was asked if she wanted to go to physical therapy for her leg pain because she called to see what else could she be doing for the pain. The patient said yes and the referral was placed.

## 2017-10-29 ENCOUNTER — Encounter: Payer: Self-pay | Admitting: Internal Medicine

## 2017-11-01 ENCOUNTER — Ambulatory Visit: Payer: Medicare Other | Admitting: Internal Medicine

## 2017-11-01 ENCOUNTER — Ambulatory Visit: Payer: Self-pay | Admitting: Internal Medicine

## 2017-11-01 ENCOUNTER — Encounter: Payer: Self-pay | Admitting: Internal Medicine

## 2017-11-01 VITALS — BP 112/64 | HR 78 | Temp 98.1°F | Ht 62.25 in | Wt 157.8 lb

## 2017-11-01 DIAGNOSIS — E1122 Type 2 diabetes mellitus with diabetic chronic kidney disease: Secondary | ICD-10-CM | POA: Diagnosis not present

## 2017-11-01 DIAGNOSIS — N183 Chronic kidney disease, stage 3 unspecified: Secondary | ICD-10-CM

## 2017-11-01 DIAGNOSIS — I129 Hypertensive chronic kidney disease with stage 1 through stage 4 chronic kidney disease, or unspecified chronic kidney disease: Secondary | ICD-10-CM | POA: Diagnosis not present

## 2017-11-01 MED ORDER — SEMAGLUTIDE(0.25 OR 0.5MG/DOS) 2 MG/1.5ML ~~LOC~~ SOPN: 0.5000 mg | PEN_INJECTOR | SUBCUTANEOUS | 1 refills | Status: DC

## 2017-11-01 NOTE — Progress Notes (Signed)
Subjective:     Patient ID: Michelle Cummings , female    DOB: [birth year] , 67 y.o.   MRN: [id number]   Chief Complaint  Patient presents with  . Diabetes  . Hypertension    HPI  Diabetes  She presents for her follow-up diabetic visit. She has type 2 diabetes mellitus. Her disease course has been improving. There are no hypoglycemic associated symptoms. Pertinent negatives for diabetes include no chest pain, no fatigue, no foot paresthesias, no polydipsia, no polyphagia and no polyuria. There are no hypoglycemic complications. Diabetic complications include nephropathy. Risk factors for coronary artery disease include diabetes mellitus, dyslipidemia, hypertension, sedentary lifestyle and post-menopausal.  Hypertension  This is a chronic problem. The current episode started more than 1 year ago. The problem has been gradually improving since onset. Pertinent negatives include no chest pain.  SHE REPORTS COMPLIANCE WITH MEDS.    Past Medical History:  Diagnosis Date  . Arthritis   . Cataracts, bilateral   . Cervical disc disease   . Chronic kidney disease    Dr. Hillery Hunter yearly for CKD stage II as of 11/26/11  . Diabetes mellitus ([place])   . Eczema   . Glaucoma   . Heart murmur   . History of chicken pox   . Hyperlipidemia   . Hypertension   . Seasonal allergies   . Thyroid nodule       Current Outpatient Medications:  .  amLODipine (NORVASC) 10 MG tablet, Take 10 mg by mouth daily., Disp: , Rfl:  .  carvedilol (COREG) 6.25 MG tablet, Take 6.25 mg by mouth 2 (two) times daily with a meal., Disp: , Rfl:  .  Cholecalciferol (VITAMIN D) 1000 UNITS capsule, Take 1,000 Units by mouth 2 (two) times daily., Disp: , Rfl:  .  colchicine 0.6 MG tablet, TAKE 1 TABLET BY MOUTH ONCE DAILY, Disp: 14 tablet, Rfl: 0 .  ferrous sulfate 325 (65 FE) MG tablet, Take 1 tablet (325 mg total) by mouth daily with breakfast., Disp: 30 tablet, Rfl: 3 .  latanoprost (XALATAN) 0.005 % ophthalmic solution,  place 1 drop into both eyes at bedtime, Disp: , Rfl:  .  lidocaine (LIDODERM) 5 %, Place 1 patch onto the skin daily. Remove & Discard patch within 12 hours or as directed by MD, Disp: 30 patch, Rfl: 0 .  ONETOUCH DELICA LANCETS FINE MISC, 2 (two) times daily. for testing, Disp: , Rfl: 5 .  rosuvastatin (CRESTOR) 10 MG tablet, Take 1 tablet (10 mg total) by mouth daily., Disp: 30 tablet, Rfl: 3 .  timolol (TIMOPTIC-XR) 0.5 % ophthalmic gel-forming, Place 1 drop into both eyes daily., Disp: , Rfl:  .  valsartan-hydrochlorothiazide (DIOVAN-HCT) 80-12.5 MG tablet, Take 1 tablet by mouth daily., Disp: , Rfl:  .  vitamin C (ASCORBIC ACID) 500 MG tablet, Take 1,000 mg by mouth daily., Disp: , Rfl:    No Known Allergies   Review of Systems  Constitutional: Negative.  Negative for fatigue.  HENT: Negative.   Respiratory: Negative.   Cardiovascular: Negative.  Negative for chest pain.  Endocrine: Negative for polydipsia, polyphagia and polyuria.  Genitourinary: Negative.   Neurological: Negative.   Psychiatric/Behavioral: Negative.      Today's Vitals   11/01/17 0931  BP: 112/64  Pulse: 78  Temp: 98.1 F (36.7 C)  Weight: 157 lb 12.8 oz (71.6 kg)  Height: 5' 2.25" (1.581 m)  PainSc: 0-No pain   Body mass index is 28.63 kg/m.   Objective:  Physical  Exam  Constitutional: She is oriented to person, place, and time. She appears well-developed and well-nourished.  Eyes: EOM are normal.  Cardiovascular: Normal rate, regular rhythm and normal heart sounds.  Pulmonary/Chest: Effort normal and breath sounds normal.  Neurological: She is alert and oriented to person, place, and time.  Skin: Skin is warm and dry.  Psychiatric: She has a normal mood and affect.  Nursing note and vitals reviewed.       Assessment And Plan:     1. Diabetes mellitus with stage 3 chronic kidney disease (HCC)  SHE WILL CONTINUE WITH OZEMPIC, 0.5MG  ONCE WEEKLY. SHE WAS CONGRATULATED ON HER LIFESTYLE CHANGES.  SHE IS ENCOURAGED TO MAINTAIN HER NEW LIFESTYLE. SHE WILL RTO IN 2 MONTHS FOR HER NEXT A1C CHECK.   2. Chronic kidney disease, stage 3, mod decreased GFR (HCC)  CHRONIC. SHE IS ENCOURAGED TO STAY WELL HYDRATED.   3. Hypertensive nephropathy  WELL CONTROLLED. SHE WILL CONTINUE WITH CURRENT MEDS. SHE IS ENCOURAGED TO AVOID ADDING SALT TO HER FOODS.  [name], MD

## 2017-11-01 NOTE — Patient Instructions (Signed)
Heart-Healthy Eating Plan Heart-healthy meal planning includes:  Limiting unhealthy fats.  Increasing healthy fats.  Making other small dietary changes.  You may need to talk with your doctor or a diet specialist (dietitian) to create an eating plan that is right for you. What types of fat should I choose?  Choose healthy fats. These include olive oil and canola oil, flaxseeds, walnuts, almonds, and seeds.  Eat more omega-3 fats. These include salmon, mackerel, sardines, tuna, flaxseed oil, and ground flaxseeds. Try to eat fish at least twice each week.  Limit saturated fats. ? Saturated fats are often found in animal products, such as meats, butter, and cream. ? Plant sources of saturated fats include palm oil, palm kernel oil, and coconut oil.  Avoid foods with partially hydrogenated oils in them. These include stick margarine, some tub margarines, cookies, crackers, and other baked goods. These contain trans fats. What general guidelines do I need to follow?  Check food labels carefully. Identify foods with trans fats or high amounts of saturated fat.  Fill one half of your plate with vegetables and green salads. Eat 4-5 servings of vegetables per day. A serving of vegetables is: ? 1 cup of raw leafy vegetables. ?  cup of raw or cooked cut-up vegetables. ?  cup of vegetable juice.  Fill one fourth of your plate with whole grains. Look for the word "whole" as the first word in the ingredient list.  Fill one fourth of your plate with lean protein foods.  Eat 4-5 servings of fruit per day. A serving of fruit is: ? One medium whole fruit. ?  cup of dried fruit. ?  cup of fresh, frozen, or canned fruit. ?  cup of 100% fruit juice.  Eat more foods that contain soluble fiber. These include apples, broccoli, carrots, beans, peas, and barley. Try to get 20-30 g of fiber per day.  Eat more home-cooked food. Eat less restaurant, buffet, and fast food.  Limit or avoid  alcohol.  Limit foods high in starch and sugar.  Avoid fried foods.  Avoid frying your food. Try baking, boiling, grilling, or broiling it instead. You can also reduce fat by: ? Removing the skin from poultry. ? Removing all visible fats from meats. ? Skimming the fat off of stews, soups, and gravies before serving them. ? Steaming vegetables in water or broth.  Lose weight if you are overweight.  Eat 4-5 servings of nuts, legumes, and seeds per week: ? One serving of dried beans or legumes equals  cup after being cooked. ? One serving of nuts equals 1 ounces. ? One serving of seeds equals  ounce or one tablespoon.  You may need to keep track of how much salt or sodium you eat. This is especially true if you have high blood pressure. Talk with your doctor or dietitian to get more information. What foods can I eat? Grains Breads, including French, Margulies, pita, wheat, raisin, rye, oatmeal, and Italian. Tortillas that are neither fried nor made with lard or trans fat. Low-fat rolls, including hotdog and hamburger buns and English muffins. Biscuits. Muffins. Waffles. Pancakes. Light popcorn. Whole-grain cereals. Flatbread. Melba toast. Pretzels. Breadsticks. Rusks. Low-fat snacks. Low-fat crackers, including oyster, saltine, matzo, graham, animal, and rye. Rice and pasta, including brown rice and pastas that are made with whole wheat. Vegetables All vegetables. Fruits All fruits, but limit coconut. Meats and Other Protein Sources Lean, well-trimmed beef, veal, pork, and lamb. Chicken and turkey without skin. All fish and shellfish.   Wild duck, rabbit, pheasant, and venison. Egg whites or low-cholesterol egg substitutes. Dried beans, peas, lentils, and tofu. Seeds and most nuts. Dairy Low-fat or nonfat cheeses, including ricotta, string, and mozzarella. Skim or 1% milk that is liquid, powdered, or evaporated. Buttermilk that is made with low-fat milk. Nonfat or low-fat  yogurt. Beverages Mineral water. Diet carbonated beverages. Sweets and Desserts Sherbets and fruit ices. Honey, jam, marmalade, jelly, and syrups. Meringues and gelatins. Pure sugar candy, such as hard candy, jelly beans, gumdrops, mints, marshmallows, and small amounts of dark chocolate. [employer] Eat all sweets and desserts in moderation. Fats and Oils Nonhydrogenated (trans-free) margarines. Vegetable oils, including soybean, sesame, sunflower, olive, peanut, safflower, corn, canola, and cottonseed. Salad dressings or mayonnaise made with a vegetable oil. Limit added fats and oils that you use for cooking, baking, salads, and as spreads. Other Cocoa powder. Coffee and tea. All seasonings and condiments. The items listed above may not be a complete list of recommended foods or beverages. Contact your dietitian for more options. What foods are not recommended? Grains Breads that are made with saturated or trans fats, oils, or whole milk. Croissants. Butter rolls. Cheese breads. Sweet rolls. Donuts. Buttered popcorn. Chow mein noodles. High-fat crackers, such as cheese or butter crackers. Meats and Other Protein Sources Fatty meats, such as hotdogs, short ribs, sausage, spareribs, bacon, rib eye roast or steak, and mutton. High-fat deli meats, such as salami and bologna. Caviar. Domestic duck and goose. Organ meats, such as kidney, liver, sweetbreads, and heart. Dairy Cream, sour cream, cream cheese, and creamed cottage cheese. Whole-milk cheeses, including blue (bleu), [place], [place], [place], American, [place], Swiss, cheddar, [place], and [place]. Whole or 2% milk that is liquid, evaporated, or condensed. Whole buttermilk. Cream sauce or high-fat cheese sauce. Yogurt that is made from whole milk. Beverages Regular sodas and juice drinks with added sugar. Sweets and Desserts Frosting. Pudding. Cookies. Cakes other than angel food cake. Candy that has milk chocolate or [place]  chocolate, hydrogenated fat, butter, coconut, or unknown ingredients. Buttered syrups. Full-fat ice cream or ice cream drinks. Fats and Oils Gravy that has suet, meat fat, or shortening. Cocoa butter, hydrogenated oils, palm oil, coconut oil, palm kernel oil. These can often be found in baked products, candy, fried foods, nondairy creamers, and whipped toppings. Solid fats and shortenings, including bacon fat, salt pork, lard, and butter. Nondairy cream substitutes, such as coffee creamers and sour cream substitutes. Salad dressings that are made of unknown oils, cheese, or sour cream. The items listed above may not be a complete list of foods and beverages to avoid. Contact your dietitian for more information. This information is not intended to replace advice given to you by your health care provider. Make sure you discuss any questions you have with your health care provider. Document Released: 06/23/2011 Document Revised: 05/30/2015 Document Reviewed: 06/15/2013 Elsevier Interactive Patient Education  2018 [employer]. Diabetes Mellitus and Nutrition When you have diabetes (diabetes mellitus), it is very important to have healthy eating habits because your blood sugar (glucose) levels are greatly affected by what you eat and drink. Eating healthy foods in the appropriate amounts, at about the same times every day, can help you:  Control your blood glucose.  Lower your risk of heart disease.  Improve your blood pressure.  Reach or maintain a healthy weight.  Every person with diabetes is different, and each person has different needs for a meal plan. Your health care provider may recommend that you  work with a diet and nutrition specialist (dietitian) to make a meal plan that is best for you. Your meal plan may vary depending on factors such as:  The calories you need.  The medicines you take.  Your weight.  Your blood glucose, blood pressure, and cholesterol levels.  Your activity  level.  Other health conditions you have, such as heart or kidney disease.  How do carbohydrates affect me? Carbohydrates affect your blood glucose level more than any other type of food. Eating carbohydrates naturally increases the amount of glucose in your blood. Carbohydrate counting is a method for keeping track of how many carbohydrates you eat. Counting carbohydrates is important to keep your blood glucose at a healthy level, especially if you use insulin or take certain oral diabetes medicines. It is important to know how many carbohydrates you can safely have in each meal. This is different for every person. Your dietitian can help you calculate how many carbohydrates you should have at each meal and for snack. Foods that contain carbohydrates include:  Bread, cereal, rice, pasta, and crackers.  Potatoes and corn.  Peas, beans, and lentils.  Milk and yogurt.  Fruit and juice.  Desserts, such as cakes, cookies, ice cream, and candy.  How does alcohol affect me? Alcohol can cause a sudden decrease in blood glucose (hypoglycemia), especially if you use insulin or take certain oral diabetes medicines. Hypoglycemia can be a life-threatening condition. Symptoms of hypoglycemia (sleepiness, dizziness, and confusion) are similar to symptoms of having too much alcohol. If your health care provider says that alcohol is safe for you, follow these guidelines:  Limit alcohol intake to no more than 1 drink per day for nonpregnant women and 2 drinks per day for men. One drink equals 12 oz of beer, 5 oz of wine, or 1 oz of hard liquor.  Do not drink on an empty stomach.  Keep yourself hydrated with water, diet soda, or unsweetened iced tea.  Keep in mind that regular soda, juice, and other mixers may contain a lot of sugar and must be counted as carbohydrates.  What are tips for following this plan? Reading food labels  Start by checking the serving size on the label. The amount of  calories, carbohydrates, fats, and other nutrients listed on the label are based on one serving of the food. Many foods contain more than one serving per package.  Check the total grams (g) of carbohydrates in one serving. You can calculate the number of servings of carbohydrates in one serving by dividing the total carbohydrates by 15. For example, if a food has 30 g of total carbohydrates, it would be equal to 2 servings of carbohydrates.  Check the number of grams (g) of saturated and trans fats in one serving. Choose foods that have low or no amount of these fats.  Check the number of milligrams (mg) of sodium in one serving. Most people should limit total sodium intake to less than 2,300 mg per day.  Always check the nutrition information of foods labeled as "low-fat" or "nonfat". These foods may be higher in added sugar or refined carbohydrates and should be avoided.  Talk to your dietitian to identify your daily goals for nutrients listed on the label. Shopping  Avoid buying canned, premade, or processed foods. These foods tend to be high in fat, sodium, and added sugar.  Shop around the outside edge of the grocery store. This includes fresh fruits and vegetables, bulk grains, fresh meats, and fresh  dairy. Cooking  Use low-heat cooking methods, such as baking, instead of high-heat cooking methods like deep frying.  Cook using healthy oils, such as olive, canola, or sunflower oil.  Avoid cooking with butter, cream, or high-fat meats. Meal planning  Eat meals and snacks regularly, preferably at the same times every day. Avoid going long periods of time without eating.  Eat foods high in fiber, such as fresh fruits, vegetables, beans, and whole grains. Talk to your dietitian about how many servings of carbohydrates you can eat at each meal.  Eat 4-6 ounces of lean protein each day, such as lean meat, chicken, fish, eggs, or tofu. 1 ounce is equal to 1 ounce of meat, chicken, or fish,  1 egg, or 1/4 cup of tofu.  Eat some foods each day that contain healthy fats, such as avocado, nuts, seeds, and fish. Lifestyle   Check your blood glucose regularly.  Exercise at least 30 minutes 5 or more days each week, or as told by your health care provider.  Take medicines as told by your health care provider.  Do not use any products that contain nicotine or tobacco, such as cigarettes and e-cigarettes. If you need help quitting, ask your health care provider.  Work with a Social worker or diabetes educator to identify strategies to manage stress and any emotional and social challenges. What are some questions to ask my health care provider?  Do I need to meet with a diabetes educator?  Do I need to meet with a dietitian?  What number can I call if I have questions?  When are the best times to check my blood glucose? Where to find more information:  American Diabetes Association: diabetes.org/food-and-fitness/food  Academy of Nutrition and Dietetics: PokerClues.dk  [employer] of Diabetes and Digestive and Kidney Diseases (NIH): ContactWire.be Summary  A healthy meal plan will help you control your blood glucose and maintain a healthy lifestyle.  Working with a diet and nutrition specialist (dietitian) can help you make a meal plan that is best for you.  Keep in mind that carbohydrates and alcohol have immediate effects on your blood glucose levels. It is important to count carbohydrates and to use alcohol carefully. This information is not intended to replace advice given to you by your health care provider. Make sure you discuss any questions you have with your health care provider. Document Released: 09/18/2004 Document Revised: 01/27/2016 Document Reviewed: 01/27/2016 Elsevier Interactive Patient Education  [employer].

## 2017-11-05 ENCOUNTER — Other Ambulatory Visit: Payer: Self-pay

## 2017-11-05 MED ORDER — BACLOFEN 10 MG PO TABS: 10.0000 mg | ORAL_TABLET | Freq: Two times a day (BID) | ORAL | 0 refills | Status: DC | PRN

## 2017-11-13 ENCOUNTER — Other Ambulatory Visit: Payer: Self-pay | Admitting: Internal Medicine

## 2017-11-14 ENCOUNTER — Other Ambulatory Visit: Payer: Self-pay | Admitting: Nurse Practitioner

## 2017-11-24 ENCOUNTER — Encounter: Payer: Self-pay | Admitting: Internal Medicine

## 2017-11-24 ENCOUNTER — Ambulatory Visit: Payer: Medicare Other | Admitting: Internal Medicine

## 2017-11-24 VITALS — BP 150/70 | HR 89 | Temp 97.6°F | Ht 62.5 in | Wt 151.6 lb

## 2017-11-24 DIAGNOSIS — M25512 Pain in left shoulder: Secondary | ICD-10-CM | POA: Diagnosis not present

## 2017-11-24 DIAGNOSIS — M25511 Pain in right shoulder: Secondary | ICD-10-CM

## 2017-11-24 DIAGNOSIS — M153 Secondary multiple arthritis: Secondary | ICD-10-CM | POA: Diagnosis not present

## 2017-11-24 DIAGNOSIS — R5383 Other fatigue: Secondary | ICD-10-CM

## 2017-11-24 DIAGNOSIS — M791 Myalgia, unspecified site: Secondary | ICD-10-CM | POA: Diagnosis not present

## 2017-11-24 DIAGNOSIS — I10 Essential (primary) hypertension: Secondary | ICD-10-CM

## 2017-11-24 NOTE — Progress Notes (Signed)
Subjective:     Patient ID: Michelle Cummings , female    DOB: Apr 22, 1950 , 67 y.o.   MRN: [id number]   Chief Complaint  Patient presents with  . Joint Pain    has been doing PT not feeling better     HPI Pt is here due to having fatigue and joint pains occurring in other joints. After she started PT and 2-3 weks ago for L hip pain, then  pain moved to R inner thigh, then, then L shoulder pain 1 week ago, then R shoulder pain started 5 days ago, and though her shoulders are not as stiff and painful, she is still having aches on both and R thigh.  She denies back pain.  Had more pain earlier today, and is not as bad right. Has taken her valsartan this am, but not the amlodipine this am, but forgot her carvedilol last night.  She admits of eating more carbs than she should.  Past Medical History:  Diagnosis Date  . Arthritis   . Cataracts, bilateral   . Cervical disc disease   . Chronic kidney disease    Dr. Hillery Hunter yearly for CKD stage II as of 11/26/11  . Diabetes mellitus ([place])   . Eczema   . Glaucoma   . Heart murmur   . History of chicken pox   . Hyperlipidemia   . Hypertension   . Seasonal allergies   . Thyroid nodule      History reviewed. No pertinent family history.   Current Outpatient Medications:  .  amLODipine (NORVASC) 10 MG tablet, Take 10 mg by mouth daily., Disp: , Rfl:  .  baclofen (LIORESAL) 10 MG tablet, TAKE 1 TABLET BY MOUTH TWICE DAILY AS NEEDED FOR MUSCLE SPASMS, Disp: 20 tablet, Rfl: 0 .  carvedilol (COREG) 6.25 MG tablet, Take 6.25 mg by mouth 2 (two) times daily with a meal., Disp: , Rfl:  .  Cholecalciferol (VITAMIN D) 1000 UNITS capsule, Take 1,000 Units by mouth 2 (two) times daily., Disp: , Rfl:  .  colchicine 0.6 MG tablet, TAKE 1 TABLET BY MOUTH ONCE DAILY, Disp: 14 tablet, Rfl: 0 .  ferrous sulfate 325 (65 FE) MG tablet, Take 1 tablet (325 mg total) by mouth daily with breakfast., Disp: 30 tablet, Rfl: 3 .  latanoprost (XALATAN) 0.005 %  ophthalmic solution, place 1 drop into both eyes at bedtime, Disp: , Rfl:  .  lidocaine (LIDODERM) 5 %, Place 1 patch onto the skin daily. Remove & Discard patch within 12 hours or as directed by MD, Disp: 30 patch, Rfl: 0 .  ONE TOUCH ULTRA TEST test strip, CHECK BS BID B BRE AND DINNER, Disp: , Rfl: 10 .  ONETOUCH DELICA LANCETS FINE MISC, TEST TWICE DAILY, Disp: 100 each, Rfl: 0 .  rosuvastatin (CRESTOR) 10 MG tablet, Take 1 tablet (10 mg total) by mouth daily., Disp: 30 tablet, Rfl: 3 .  Semaglutide,0.25 or 0.5MG/DOS, (OZEMPIC, 0.25 OR 0.5 MG/DOSE,) 2 MG/1.5ML SOPN, Inject 0.5 mg into the skin once a week., Disp: 1 pen, Rfl: 1 .  timolol (TIMOPTIC-XR) 0.5 % ophthalmic gel-forming, Place 1 drop into both eyes daily., Disp: , Rfl:  .  valsartan-hydrochlorothiazide (DIOVAN-HCT) 80-12.5 MG tablet, Take 1 tablet by mouth daily., Disp: , Rfl:  .  vitamin C (ASCORBIC ACID) 500 MG tablet, Take 1,000 mg by mouth daily., Disp: , Rfl:    No Known Allergies   Review of Systems  Constitutional: Positive for appetite change, fever and  Subjective:     Patient ID: Michelle Cummings , female    DOB: Apr 22, 1950 , 67 y.o.   MRN: [id number]   Chief Complaint  Patient presents with  . Joint Pain    has been doing PT not feeling better     HPI Pt is here due to having fatigue and joint pains occurring in other joints. After she started PT and 2-3 weks ago for L hip pain, then  pain moved to R inner thigh, then, then L shoulder pain 1 week ago, then R shoulder pain started 5 days ago, and though her shoulders are not as stiff and painful, she is still having aches on both and R thigh.  She denies back pain.  Had more pain earlier today, and is not as bad right. Has taken her valsartan this am, but not the amlodipine this am, but forgot her carvedilol last night.  She admits of eating more carbs than she should.  Past Medical History:  Diagnosis Date  . Arthritis   . Cataracts, bilateral   . Cervical disc disease   . Chronic kidney disease    Dr. Hillery Hunter yearly for CKD stage II as of 11/26/11  . Diabetes mellitus ([place])   . Eczema   . Glaucoma   . Heart murmur   . History of chicken pox   . Hyperlipidemia   . Hypertension   . Seasonal allergies   . Thyroid nodule      History reviewed. No pertinent family history.   Current Outpatient Medications:  .  amLODipine (NORVASC) 10 MG tablet, Take 10 mg by mouth daily., Disp: , Rfl:  .  baclofen (LIORESAL) 10 MG tablet, TAKE 1 TABLET BY MOUTH TWICE DAILY AS NEEDED FOR MUSCLE SPASMS, Disp: 20 tablet, Rfl: 0 .  carvedilol (COREG) 6.25 MG tablet, Take 6.25 mg by mouth 2 (two) times daily with a meal., Disp: , Rfl:  .  Cholecalciferol (VITAMIN D) 1000 UNITS capsule, Take 1,000 Units by mouth 2 (two) times daily., Disp: , Rfl:  .  colchicine 0.6 MG tablet, TAKE 1 TABLET BY MOUTH ONCE DAILY, Disp: 14 tablet, Rfl: 0 .  ferrous sulfate 325 (65 FE) MG tablet, Take 1 tablet (325 mg total) by mouth daily with breakfast., Disp: 30 tablet, Rfl: 3 .  latanoprost (XALATAN) 0.005 %  ophthalmic solution, place 1 drop into both eyes at bedtime, Disp: , Rfl:  .  lidocaine (LIDODERM) 5 %, Place 1 patch onto the skin daily. Remove & Discard patch within 12 hours or as directed by MD, Disp: 30 patch, Rfl: 0 .  ONE TOUCH ULTRA TEST test strip, CHECK BS BID B BRE AND DINNER, Disp: , Rfl: 10 .  ONETOUCH DELICA LANCETS FINE MISC, TEST TWICE DAILY, Disp: 100 each, Rfl: 0 .  rosuvastatin (CRESTOR) 10 MG tablet, Take 1 tablet (10 mg total) by mouth daily., Disp: 30 tablet, Rfl: 3 .  Semaglutide,0.25 or 0.5MG/DOS, (OZEMPIC, 0.25 OR 0.5 MG/DOSE,) 2 MG/1.5ML SOPN, Inject 0.5 mg into the skin once a week., Disp: 1 pen, Rfl: 1 .  timolol (TIMOPTIC-XR) 0.5 % ophthalmic gel-forming, Place 1 drop into both eyes daily., Disp: , Rfl:  .  valsartan-hydrochlorothiazide (DIOVAN-HCT) 80-12.5 MG tablet, Take 1 tablet by mouth daily., Disp: , Rfl:  .  vitamin C (ASCORBIC ACID) 500 MG tablet, Take 1,000 mg by mouth daily., Disp: , Rfl:    No Known Allergies   Review of Systems  Constitutional: Positive for appetite change, fever and  Subjective:     Patient ID: Michelle Cummings , female    DOB: Apr 22, 1950 , 67 y.o.   MRN: [id number]   Chief Complaint  Patient presents with  . Joint Pain    has been doing PT not feeling better     HPI Pt is here due to having fatigue and joint pains occurring in other joints. After she started PT and 2-3 weks ago for L hip pain, then  pain moved to R inner thigh, then, then L shoulder pain 1 week ago, then R shoulder pain started 5 days ago, and though her shoulders are not as stiff and painful, she is still having aches on both and R thigh.  She denies back pain.  Had more pain earlier today, and is not as bad right. Has taken her valsartan this am, but not the amlodipine this am, but forgot her carvedilol last night.  She admits of eating more carbs than she should.  Past Medical History:  Diagnosis Date  . Arthritis   . Cataracts, bilateral   . Cervical disc disease   . Chronic kidney disease    Dr. Hillery Hunter yearly for CKD stage II as of 11/26/11  . Diabetes mellitus ([place])   . Eczema   . Glaucoma   . Heart murmur   . History of chicken pox   . Hyperlipidemia   . Hypertension   . Seasonal allergies   . Thyroid nodule      History reviewed. No pertinent family history.   Current Outpatient Medications:  .  amLODipine (NORVASC) 10 MG tablet, Take 10 mg by mouth daily., Disp: , Rfl:  .  baclofen (LIORESAL) 10 MG tablet, TAKE 1 TABLET BY MOUTH TWICE DAILY AS NEEDED FOR MUSCLE SPASMS, Disp: 20 tablet, Rfl: 0 .  carvedilol (COREG) 6.25 MG tablet, Take 6.25 mg by mouth 2 (two) times daily with a meal., Disp: , Rfl:  .  Cholecalciferol (VITAMIN D) 1000 UNITS capsule, Take 1,000 Units by mouth 2 (two) times daily., Disp: , Rfl:  .  colchicine 0.6 MG tablet, TAKE 1 TABLET BY MOUTH ONCE DAILY, Disp: 14 tablet, Rfl: 0 .  ferrous sulfate 325 (65 FE) MG tablet, Take 1 tablet (325 mg total) by mouth daily with breakfast., Disp: 30 tablet, Rfl: 3 .  latanoprost (XALATAN) 0.005 %  ophthalmic solution, place 1 drop into both eyes at bedtime, Disp: , Rfl:  .  lidocaine (LIDODERM) 5 %, Place 1 patch onto the skin daily. Remove & Discard patch within 12 hours or as directed by MD, Disp: 30 patch, Rfl: 0 .  ONE TOUCH ULTRA TEST test strip, CHECK BS BID B BRE AND DINNER, Disp: , Rfl: 10 .  ONETOUCH DELICA LANCETS FINE MISC, TEST TWICE DAILY, Disp: 100 each, Rfl: 0 .  rosuvastatin (CRESTOR) 10 MG tablet, Take 1 tablet (10 mg total) by mouth daily., Disp: 30 tablet, Rfl: 3 .  Semaglutide,0.25 or 0.5MG/DOS, (OZEMPIC, 0.25 OR 0.5 MG/DOSE,) 2 MG/1.5ML SOPN, Inject 0.5 mg into the skin once a week., Disp: 1 pen, Rfl: 1 .  timolol (TIMOPTIC-XR) 0.5 % ophthalmic gel-forming, Place 1 drop into both eyes daily., Disp: , Rfl:  .  valsartan-hydrochlorothiazide (DIOVAN-HCT) 80-12.5 MG tablet, Take 1 tablet by mouth daily., Disp: , Rfl:  .  vitamin C (ASCORBIC ACID) 500 MG tablet, Take 1,000 mg by mouth daily., Disp: , Rfl:    No Known Allergies   Review of Systems  Constitutional: Positive for appetite change, fever and

## 2017-11-24 NOTE — Patient Instructions (Signed)

## 2017-11-25 LAB — T3, FREE: T3, Free: 2 pg/mL (ref 2.0–4.4)

## 2017-11-25 LAB — CBC
Hematocrit: 29.7 % — ABNORMAL LOW (ref 34.0–46.6)
Hemoglobin: 9.3 g/dL — ABNORMAL LOW (ref 11.1–15.9)
MCH: 25.5 pg — ABNORMAL LOW (ref 26.6–33.0)
MCHC: 31.3 g/dL — ABNORMAL LOW (ref 31.5–35.7)
MCV: 82 fL (ref 79–97)
Platelets: 450 10*3/uL (ref 150–450)
RBC: 3.64 x10E6/uL — ABNORMAL LOW (ref 3.77–5.28)
RDW: 12.3 % (ref 12.3–15.4)
WBC: 8.7 10*3/uL (ref 3.4–10.8)

## 2017-11-25 LAB — TSH: TSH: 1.97 u[IU]/mL (ref 0.450–4.500)

## 2017-11-25 LAB — SEDIMENTATION RATE: Sed Rate: 69 mm/hr — ABNORMAL HIGH (ref 0–40)

## 2017-11-25 LAB — ANTINUCLEAR ANTIBODIES, IFA: ANA Titer 1: NEGATIVE

## 2017-11-25 LAB — T4, FREE: Free T4: 1.28 ng/dL (ref 0.82–1.77)

## 2017-11-30 ENCOUNTER — Other Ambulatory Visit: Payer: Self-pay | Admitting: Internal Medicine

## 2017-11-30 DIAGNOSIS — D508 Other iron deficiency anemias: Secondary | ICD-10-CM

## 2017-11-30 NOTE — Progress Notes (Signed)
Iron labs added

## 2017-12-07 ENCOUNTER — Telehealth: Payer: Self-pay

## 2017-12-07 NOTE — Telephone Encounter (Signed)
Patient notified of her lab results and I left her a v/m to call the office back to schedule an office visit for her rectal exam as requested from her v/m. YRL,RMA

## 2017-12-08 ENCOUNTER — Ambulatory Visit: Payer: Self-pay | Admitting: Internal Medicine

## 2017-12-13 ENCOUNTER — Other Ambulatory Visit: Payer: Self-pay | Admitting: Internal Medicine

## 2017-12-13 DIAGNOSIS — Z1231 Encounter for screening mammogram for malignant neoplasm of breast: Secondary | ICD-10-CM

## 2017-12-14 ENCOUNTER — Ambulatory Visit: Payer: Medicare Other | Admitting: Internal Medicine

## 2017-12-14 ENCOUNTER — Ambulatory Visit: Payer: Self-pay | Admitting: Internal Medicine

## 2017-12-14 ENCOUNTER — Encounter: Payer: Self-pay | Admitting: Internal Medicine

## 2017-12-14 VITALS — BP 120/70 | HR 81 | Temp 97.8°F | Ht 62.5 in | Wt 152.8 lb

## 2017-12-14 DIAGNOSIS — Z1211 Encounter for screening for malignant neoplasm of colon: Secondary | ICD-10-CM

## 2017-12-14 DIAGNOSIS — D508 Other iron deficiency anemias: Secondary | ICD-10-CM

## 2017-12-14 DIAGNOSIS — R195 Other fecal abnormalities: Secondary | ICD-10-CM | POA: Diagnosis not present

## 2017-12-14 NOTE — Progress Notes (Signed)
Subjective:     Patient ID: Michelle Cummings , female    DOB: 1950-06-12 , 67 y.o.   MRN: [id number]   Chief Complaint  Patient presents with  . Follow-up    Coloscreen     HPI Pt is here for rectal exam due to anemia and did not want to collect stool cards at home. Her HH was low on her last lab. She has not seen blood in her stool     Past Medical History:  Diagnosis Date  . Arthritis   . Cataracts, bilateral   . Cervical disc disease   . Chronic kidney disease    Dr. Antoine Poche yearly for CKD stage II as of 11/26/11  . Diabetes mellitus (HCC)   . Eczema   . Glaucoma   . Heart murmur   . History of chicken pox   . Hyperlipidemia   . Hypertension   . Seasonal allergies   . Thyroid nodule     Current Outpatient Medications:  .  amLODipine (NORVASC) 10 MG tablet, Take 10 mg by mouth daily., Disp: , Rfl:  .  carvedilol (COREG) 6.25 MG tablet, Take 6.25 mg by mouth 2 (two) times daily with a meal., Disp: , Rfl:  .  Cholecalciferol (VITAMIN D) 1000 UNITS capsule, Take 1,000 Units by mouth 2 (two) times daily., Disp: , Rfl:  .  colchicine 0.6 MG tablet, TAKE 1 TABLET BY MOUTH ONCE DAILY, Disp: 14 tablet, Rfl: 0 .  ferrous sulfate 325 (65 FE) MG tablet, Take 1 tablet (325 mg total) by mouth daily with breakfast., Disp: 30 tablet, Rfl: 3 .  latanoprost (XALATAN) 0.005 % ophthalmic solution, place 1 drop into both eyes at bedtime, Disp: , Rfl:  .  ONE TOUCH ULTRA TEST test strip, CHECK BS BID B BRE AND DINNER, Disp: , Rfl: 10 .  ONETOUCH DELICA LANCETS FINE MISC, TEST TWICE DAILY, Disp: 100 each, Rfl: 0 .  rosuvastatin (CRESTOR) 10 MG tablet, Take 1 tablet (10 mg total) by mouth daily., Disp: 30 tablet, Rfl: 3 .  Semaglutide,0.25 or 0.5MG /DOS, (OZEMPIC, 0.25 OR 0.5 MG/DOSE,) 2 MG/1.5ML SOPN, Inject 0.5 mg into the skin once a week., Disp: 1 pen, Rfl: 1 .  timolol (TIMOPTIC-XR) 0.5 % ophthalmic gel-forming, Place 1 drop into both eyes daily., Disp: , Rfl:  .   valsartan-hydrochlorothiazide (DIOVAN-HCT) 80-12.5 MG tablet, Take 1 tablet by mouth daily., Disp: , Rfl:  .  vitamin C (ASCORBIC ACID) 500 MG tablet, Take 1,000 mg by mouth daily., Disp: , Rfl:    No Known Allergies   Review of Systems  Constitutional: Negative for fatigue.  Gastrointestinal: Negative for abdominal pain, blood in stool and constipation.     Today's Vitals   12/14/17 1042  BP: 120/70  Pulse: 81  Temp: 97.8 F (36.6 C)  TempSrc: Oral  SpO2: 98%  Weight: 152 lb 12.8 oz (69.3 kg)  Height: 5' 2.5" (1.588 m)   Body mass index is 27.5 kg/m.   Objective:  Physical Exam Vitals signs and nursing note reviewed.  Constitutional:      General: She is not in acute distress.    Appearance: Normal appearance.  HENT:     Head: Normocephalic.     Nose: Nose normal.  Eyes:     Conjunctiva/sclera: Conjunctivae normal.  Neck:     Musculoskeletal: Neck supple.  Pulmonary:     Effort: Pulmonary effort is normal.  Genitourinary:    Rectum: Guaiac result positive.  Comments: No anal abnormalities, masses and no blood seen in the stool .  Skin:    General: Skin is warm and dry.  Neurological:     Mental Status: She is alert and oriented to person, place, and time.  Psychiatric:        Mood and Affect: Mood normal.    Assessment And Plan:    1. Other iron deficiency anemia- new  2. Encounter for Hemoccult screening- + hemoccult today. Was sent to Dr Loreta Ave.   FU as scheduled for her chronic care. May go ahead and start taking the Fe.   Brock Mokry RODRIGUEZ-SOUTHWORTH, PA-C

## 2018-01-07 ENCOUNTER — Other Ambulatory Visit: Payer: Self-pay | Admitting: Internal Medicine

## 2018-01-12 ENCOUNTER — Other Ambulatory Visit: Payer: Self-pay | Admitting: Internal Medicine

## 2018-01-12 DIAGNOSIS — M10342 Gout due to renal impairment, left hand: Secondary | ICD-10-CM

## 2018-01-18 ENCOUNTER — Ambulatory Visit
Admission: RE | Admit: 2018-01-18 | Discharge: 2018-01-18 | Disposition: A | Discharge status: Home / Self Care | Payer: Medicare Other | Source: Ambulatory Visit | Attending: Internal Medicine | Admitting: Internal Medicine

## 2018-01-18 DIAGNOSIS — Z1231 Encounter for screening mammogram for malignant neoplasm of breast: Secondary | ICD-10-CM

## 2018-01-20 ENCOUNTER — Ambulatory Visit: Payer: Self-pay

## 2018-01-20 ENCOUNTER — Other Ambulatory Visit: Payer: Self-pay

## 2018-01-20 MED ORDER — SEMAGLUTIDE(0.25 OR 0.5MG/DOS) 2 MG/1.5ML ~~LOC~~ SOPN: 0.5000 mg | PEN_INJECTOR | SUBCUTANEOUS | 1 refills | Status: DC

## 2018-01-24 ENCOUNTER — Other Ambulatory Visit: Payer: Self-pay | Admitting: Internal Medicine

## 2018-01-27 ENCOUNTER — Other Ambulatory Visit: Payer: Self-pay

## 2018-02-01 ENCOUNTER — Ambulatory Visit: Payer: Medicare Other | Admitting: Internal Medicine

## 2018-02-01 ENCOUNTER — Encounter: Payer: Self-pay | Admitting: Internal Medicine

## 2018-02-01 VITALS — BP 152/80 | HR 79 | Temp 97.6°F | Ht 63.6 in | Wt 152.6 lb

## 2018-02-01 DIAGNOSIS — I129 Hypertensive chronic kidney disease with stage 1 through stage 4 chronic kidney disease, or unspecified chronic kidney disease: Secondary | ICD-10-CM | POA: Diagnosis not present

## 2018-02-01 DIAGNOSIS — I700 Atherosclerosis of aorta: Secondary | ICD-10-CM | POA: Insufficient documentation

## 2018-02-01 DIAGNOSIS — N183 Chronic kidney disease, stage 3 unspecified: Secondary | ICD-10-CM

## 2018-02-01 DIAGNOSIS — E1122 Type 2 diabetes mellitus with diabetic chronic kidney disease: Secondary | ICD-10-CM

## 2018-02-01 DIAGNOSIS — E7800 Pure hypercholesterolemia, unspecified: Secondary | ICD-10-CM

## 2018-02-01 NOTE — Progress Notes (Signed)
Subjective:     Patient ID: Michelle Cummings , female    DOB: [birth year] , 68 y.o.   MRN: [id number]   Chief Complaint  Patient presents with  . Diabetes  . Hypertension    HPI  Diabetes  She presents for her follow-up diabetic visit. She has type 2 diabetes mellitus. Her disease course has been stable. There are no hypoglycemic associated symptoms. Pertinent negatives for diabetes include no blurred vision and no chest pain. There are no hypoglycemic complications. Diabetic complications include nephropathy. Risk factors for coronary artery disease include diabetes mellitus, dyslipidemia, hypertension and post-menopausal. Her breakfast blood glucose is taken between 9-10 am. Her breakfast blood glucose range is generally 110-130 mg/dl.  Hypertension  This is a chronic problem. The current episode started more than 1 year ago. The problem has been gradually improving since onset. The problem is controlled. Pertinent negatives include no blurred vision or chest pain.   She reports compliance with meds.   Past Medical History:  Diagnosis Date  . Arthritis   . Cataracts, bilateral   . Cervical disc disease   . Chronic kidney disease    Dr. Hillery Hunter yearly for CKD stage II as of 11/26/11  . Diabetes mellitus ([place])   . Eczema   . Glaucoma   . Heart murmur   . History of chicken pox   . Hyperlipidemia   . Hypertension   . Seasonal allergies   . Thyroid nodule      Family History  Problem Relation Age of Onset  . Hypertension Mother   . Early death Father      Current Outpatient Medications:  .  amLODipine (NORVASC) 10 MG tablet, TAKE 1 TABLET BY MOUTH EVERY DAY, Disp: 30 tablet, Rfl: 4 .  carvedilol (COREG) 6.25 MG tablet, Take 6.25 mg by mouth 2 (two) times daily with a meal., Disp: , Rfl:  .  Cholecalciferol (VITAMIN D) 1000 UNITS capsule, Take 1,000 Units by mouth 2 (two) times daily., Disp: , Rfl:  .  colchicine 0.6 MG tablet, TAKE 1 TABLET BY MOUTH EVERY DAY, Disp: 30  tablet, Rfl: 4 .  ferrous sulfate 325 (65 FE) MG tablet, Take 1 tablet (325 mg total) by mouth daily with breakfast., Disp: 30 tablet, Rfl: 3 .  latanoprost (XALATAN) 0.005 % ophthalmic solution, place 1 drop into both eyes at bedtime, Disp: , Rfl:  .  ONE TOUCH ULTRA TEST test strip, CHECK BS BID B BRE AND DINNER, Disp: , Rfl: 10 .  ONETOUCH DELICA LANCETS 40J MISC, TEST TWICE DAILY, Disp: 100 each, Rfl: 0 .  rosuvastatin (CRESTOR) 10 MG tablet, Take 1 tablet (10 mg total) by mouth daily., Disp: 30 tablet, Rfl: 3 .  Semaglutide,0.25 or 0.5MG /DOS, (OZEMPIC, 0.25 OR 0.5 MG/DOSE,) 2 MG/1.5ML SOPN, Inject 0.5 mg into the skin once a week., Disp: 1 pen, Rfl: 1 .  timolol (TIMOPTIC-XR) 0.5 % ophthalmic gel-forming, Place 1 drop into both eyes daily., Disp: , Rfl:  .  valsartan-hydrochlorothiazide (DIOVAN-HCT) 80-12.5 MG tablet, Take 1 tablet by mouth daily., Disp: , Rfl:  .  vitamin C (ASCORBIC ACID) 500 MG tablet, Take 1,000 mg by mouth daily., Disp: , Rfl:    No Known Allergies   Review of Systems  Constitutional: Negative.   Eyes: Negative for blurred vision.  Respiratory: Negative.   Cardiovascular: Negative.  Negative for chest pain.  Gastrointestinal: Negative.   Neurological: Negative.   Psychiatric/Behavioral: Negative.      Today's Vitals   02/01/18  0907  BP: (!) 152/80  Pulse: 79  Temp: 97.6 F (36.4 C)  TempSrc: Oral  Weight: 152 lb 9.6 oz (69.2 kg)  Height: 5' 3.6" (1.615 m)   Body mass index is 26.52 kg/m.   Objective:  Physical Exam Vitals signs and nursing note reviewed.  Constitutional:      Appearance: Normal appearance.  HENT:     Head: Normocephalic and atraumatic.  Cardiovascular:     Rate and Rhythm: Normal rate and regular rhythm.     Heart sounds: Murmur present.  Pulmonary:     Effort: Pulmonary effort is normal.     Breath sounds: Normal breath sounds.  Skin:    General: Skin is warm.  Neurological:     General: No focal deficit present.      Mental Status: She is alert and oriented to person, place, and time.  Psychiatric:        Mood and Affect: Mood normal.         Assessment And Plan:     1. Type 2 diabetes mellitus with stage 3 chronic kidney disease, without long-term current use of insulin ([place])  I will check a1c, bmet today. She was congratulated on her lifestyle changes and encouraged to keep up the great work! She is now exercising four times per week! She will rto in 3 months for re-evaluation.   2. Hypertensive nephropathy  Uncontrolled. She admits that she did not take meds until she got here. She is encouraged to take meds same time daily. She is also encouraged to avoid adding salt to her foods.   3. Pure hypercholesterolemia  I will check fasting lipid panel at her next visit. Her last lipid results were reviewed in full detail during her visit. Importance of statin compliance was discussed with the patient.   4. Aortic atherosclerosis ([place])   I discussed the importance of compliance with her statin medication. She is encouraged to take as directed and limit her fried food intake.      [name], MD

## 2018-02-01 NOTE — Patient Instructions (Signed)
DASH Eating Plan  DASH stands for "Dietary Approaches to Stop Hypertension." The DASH eating plan is a healthy eating plan that has been shown to reduce high blood pressure (hypertension). It may also reduce your risk for type 2 diabetes, heart disease, and stroke. The DASH eating plan may also help with weight loss.  What are tips for following this plan?    General guidelines   Avoid eating more than 2,300 mg (milligrams) of salt (sodium) a day. If you have hypertension, you may need to reduce your sodium intake to 1,500 mg a day.   Limit alcohol intake to no more than 1 drink a day for nonpregnant women and 2 drinks a day for men. One drink equals 12 oz of beer, 5 oz of wine, or 1 oz of hard liquor.   Work with your health care provider to maintain a healthy body weight or to lose weight. Ask what an ideal weight is for you.   Get at least 30 minutes of exercise that causes your heart to beat faster (aerobic exercise) most days of the week. Activities may include walking, swimming, or biking.   Work with your health care provider or diet and nutrition specialist (dietitian) to adjust your eating plan to your individual calorie needs.  Reading food labels     Check food labels for the amount of sodium per serving. Choose foods with less than 5 percent of the Daily Value of sodium. Generally, foods with less than 300 mg of sodium per serving fit into this eating plan.   To find whole grains, look for the word "whole" as the first word in the ingredient list.  Shopping   Buy products labeled as "low-sodium" or "no salt added."   Buy fresh foods. Avoid canned foods and premade or frozen meals.  Cooking   Avoid adding salt when cooking. Use salt-free seasonings or herbs instead of table salt or sea salt. Check with your health care provider or pharmacist before using salt substitutes.   Do not fry foods. Cook foods using healthy methods such as baking, boiling, grilling, and broiling instead.   Cook with  heart-healthy oils, such as olive, canola, soybean, or sunflower oil.  Meal planning   Eat a balanced diet that includes:  ? 5 or more servings of fruits and vegetables each day. At each meal, try to fill half of your plate with fruits and vegetables.  ? Up to 6-8 servings of whole grains each day.  ? Less than 6 oz of lean meat, poultry, or fish each day. A 3-oz serving of meat is about the same size as a deck of cards. One egg equals 1 oz.  ? 2 servings of low-fat dairy each day.  ? A serving of nuts, seeds, or beans 5 times each week.  ? Heart-healthy fats. Healthy fats called Omega-3 fatty acids are found in foods such as flaxseeds and coldwater fish, like sardines, salmon, and mackerel.   Limit how much you eat of the following:  ? Canned or prepackaged foods.  ? Food that is high in trans fat, such as fried foods.  ? Food that is high in saturated fat, such as fatty meat.  ? Sweets, desserts, sugary drinks, and other foods with added sugar.  ? Full-fat dairy products.   Do not salt foods before eating.   Try to eat at least 2 vegetarian meals each week.   Eat more home-cooked food and less restaurant, buffet, and fast food.     When eating at a restaurant, ask that your food be prepared with less salt or no salt, if possible.  What foods are recommended?  The items listed may not be a complete list. Talk with your dietitian about what dietary choices are best for you.  Grains  Whole-grain or whole-wheat bread. Whole-grain or whole-wheat pasta. Brown rice. Oatmeal. Quinoa. Bulgur. Whole-grain and low-sodium cereals. Pita bread. Low-fat, low-sodium crackers. Whole-wheat flour tortillas.  Vegetables  Fresh or frozen vegetables (raw, steamed, roasted, or grilled). Low-sodium or reduced-sodium tomato and vegetable juice. Low-sodium or reduced-sodium tomato sauce and tomato paste. Low-sodium or reduced-sodium canned vegetables.  Fruits  All fresh, dried, or frozen fruit. Canned fruit in natural juice (without  added sugar).  Meat and other protein foods  Skinless chicken or turkey. Ground chicken or turkey. Pork with fat trimmed off. Fish and seafood. Egg whites. Dried beans, peas, or lentils. Unsalted nuts, nut butters, and seeds. Unsalted canned beans. Lean cuts of beef with fat trimmed off. Low-sodium, lean deli meat.  Dairy  Low-fat (1%) or fat-free (skim) milk. Fat-free, low-fat, or reduced-fat cheeses. Nonfat, low-sodium ricotta or cottage cheese. Low-fat or nonfat yogurt. Low-fat, low-sodium cheese.  Fats and oils  Soft margarine without trans fats. Vegetable oil. Low-fat, reduced-fat, or light mayonnaise and salad dressings (reduced-sodium). Canola, safflower, olive, soybean, and sunflower oils. Avocado.  Seasoning and other foods  Herbs. Spices. Seasoning mixes without salt. Unsalted popcorn and pretzels. Fat-free sweets.  What foods are not recommended?  The items listed may not be a complete list. Talk with your dietitian about what dietary choices are best for you.  Grains  Baked goods made with fat, such as croissants, muffins, or some breads. Dry pasta or rice meal packs.  Vegetables  Creamed or fried vegetables. Vegetables in a cheese sauce. Regular canned vegetables (not low-sodium or reduced-sodium). Regular canned tomato sauce and paste (not low-sodium or reduced-sodium). Regular tomato and vegetable juice (not low-sodium or reduced-sodium). Pickles. Olives.  Fruits  Canned fruit in a light or heavy syrup. Fried fruit. Fruit in cream or butter sauce.  Meat and other protein foods  Fatty cuts of meat. Ribs. Fried meat. Bacon. Sausage. Bologna and other processed lunch meats. Salami. Fatback. Hotdogs. Bratwurst. Salted nuts and seeds. Canned beans with added salt. Canned or smoked fish. Whole eggs or egg yolks. Chicken or turkey with skin.  Dairy  Whole or 2% milk, cream, and half-and-half. Whole or full-fat cream cheese. Whole-fat or sweetened yogurt. Full-fat cheese. Nondairy creamers. Whipped toppings.  Processed cheese and cheese spreads.  Fats and oils  Butter. Stick margarine. Lard. Shortening. Ghee. Bacon fat. Tropical oils, such as coconut, palm kernel, or palm oil.  Seasoning and other foods  Salted popcorn and pretzels. Onion salt, garlic salt, seasoned salt, table salt, and sea salt. Worcestershire sauce. Tartar sauce. Barbecue sauce. Teriyaki sauce. Soy sauce, including reduced-sodium. Steak sauce. Canned and packaged gravies. Fish sauce. Oyster sauce. Cocktail sauce. Horseradish that you find on the shelf. Ketchup. Mustard. Meat flavorings and tenderizers. Bouillon cubes. Hot sauce and Tabasco sauce. Premade or packaged marinades. Premade or packaged taco seasonings. Relishes. Regular salad dressings.  Where to find more information:   National Heart, Lung, and Blood Institute: www.nhlbi.nih.gov   American Heart Association: www.heart.org  Summary   The DASH eating plan is a healthy eating plan that has been shown to reduce high blood pressure (hypertension). It may also reduce your risk for type 2 diabetes, heart disease, and stroke.   With the   DASH eating plan, you should limit salt (sodium) intake to 2,300 mg a day. If you have hypertension, you may need to reduce your sodium intake to 1,500 mg a day.   When on the DASH eating plan, aim to eat more fresh fruits and vegetables, whole grains, lean proteins, low-fat dairy, and heart-healthy fats.   Work with your health care provider or diet and nutrition specialist (dietitian) to adjust your eating plan to your individual calorie needs.  This information is not intended to replace advice given to you by your health care provider. Make sure you discuss any questions you have with your health care provider.  Document Released: 12/11/2010 Document Revised: 12/16/2015 Document Reviewed: 12/16/2015  Elsevier Interactive Patient Education  2019 [employer] Inc[employer]

## 2018-02-02 LAB — CMP14+EGFR
ALT: 12 IU/L (ref 0–32)
AST: 9 IU/L (ref 0–40)
Albumin/Globulin Ratio: 1.6 (ref 1.2–2.2)
Albumin: 4.3 g/dL (ref 3.8–4.8)
Alkaline Phosphatase: 90 IU/L (ref 39–117)
BUN/Creatinine Ratio: 18 (ref 12–28)
BUN: 28 mg/dL — ABNORMAL HIGH (ref 8–27)
Bilirubin Total: 0.2 mg/dL (ref 0.0–1.2)
CO2: 19 mmol/L — ABNORMAL LOW (ref 20–29)
Calcium: 10.1 mg/dL (ref 8.7–10.3)
Chloride: 100 mmol/L (ref 96–106)
Creatinine, Ser: 1.58 mg/dL — ABNORMAL HIGH (ref 0.57–1.00)
GFR calc Af Amer: 38 mL/min/{1.73_m2} — ABNORMAL LOW (ref >59)
GFR calc non Af Amer: 33 mL/min/{1.73_m2} — ABNORMAL LOW (ref >59)
Globulin, Total: 2.7 g/dL (ref 1.5–4.5)
Glucose: 103 mg/dL — ABNORMAL HIGH (ref 65–99)
Potassium: 4.7 mmol/L (ref 3.5–5.2)
Sodium: 137 mmol/L (ref 134–144)
Total Protein: 7 g/dL (ref 6.0–8.5)

## 2018-02-02 LAB — HEMOGLOBIN A1C
Est. average glucose Bld gHb Est-mCnc: 134 mg/dL
Hgb A1c MFr Bld: 6.3 % — ABNORMAL HIGH (ref 4.8–5.6)

## 2018-02-24 ENCOUNTER — Other Ambulatory Visit: Payer: Self-pay

## 2018-02-24 MED ORDER — SEMAGLUTIDE(0.25 OR 0.5MG/DOS) 2 MG/1.5ML ~~LOC~~ SOPN: 0.5000 mg | PEN_INJECTOR | SUBCUTANEOUS | 1 refills | Status: DC

## 2018-03-03 ENCOUNTER — Other Ambulatory Visit: Payer: Self-pay | Admitting: Internal Medicine

## 2018-03-10 ENCOUNTER — Encounter: Payer: Self-pay | Admitting: Internal Medicine

## 2018-03-11 ENCOUNTER — Encounter: Payer: Self-pay | Admitting: Internal Medicine

## 2018-03-14 ENCOUNTER — Other Ambulatory Visit: Payer: Self-pay | Admitting: Internal Medicine

## 2018-03-15 ENCOUNTER — Encounter: Payer: Self-pay | Admitting: Internal Medicine

## 2018-03-17 ENCOUNTER — Other Ambulatory Visit: Payer: Self-pay

## 2018-03-17 MED ORDER — SEMAGLUTIDE(0.25 OR 0.5MG/DOS) 2 MG/1.5ML ~~LOC~~ SOPN: 0.5000 mg | PEN_INJECTOR | SUBCUTANEOUS | 1 refills | Status: DC

## 2018-03-17 MED ORDER — ONETOUCH ULTRA BLUE VI STRP: ORAL_STRIP | 11 refills | Status: DC

## 2018-03-21 ENCOUNTER — Encounter (HOSPITAL_COMMUNITY): Admission: RE | Payer: Self-pay | Source: Home / Self Care

## 2018-03-21 ENCOUNTER — Ambulatory Visit (HOSPITAL_COMMUNITY): Admission: RE | Admit: 2018-03-21 | Payer: Medicare Other | Source: Home / Self Care | Admitting: Gastroenterology

## 2018-03-21 SURGERY — IMAGING PROCEDURE, GI TRACT, INTRALUMINAL, VIA CAPSULE
Anesthesia: LOCAL

## 2018-04-14 ENCOUNTER — Other Ambulatory Visit: Payer: Self-pay

## 2018-04-14 MED ORDER — CARVEDILOL 6.25 MG PO TABS: 6.2500 mg | ORAL_TABLET | Freq: Two times a day (BID) | ORAL | 0 refills | Status: DC

## 2018-04-14 MED ORDER — AMLODIPINE BESYLATE 10 MG PO TABS: 10.0000 mg | ORAL_TABLET | Freq: Every day | ORAL | 0 refills | Status: DC

## 2018-04-14 MED ORDER — VALSARTAN-HYDROCHLOROTHIAZIDE 80-12.5 MG PO TABS: 1.0000 | ORAL_TABLET | Freq: Every day | ORAL | 0 refills | Status: DC

## 2018-05-03 ENCOUNTER — Encounter: Payer: Self-pay | Admitting: Internal Medicine

## 2018-05-03 ENCOUNTER — Ambulatory Visit: Payer: Medicare Other | Admitting: Internal Medicine

## 2018-05-03 ENCOUNTER — Other Ambulatory Visit: Payer: Self-pay

## 2018-05-03 ENCOUNTER — Telehealth: Payer: Self-pay

## 2018-05-03 VITALS — BP 126/82 | HR 77 | Temp 97.5°F | Ht 63.6 in | Wt 152.4 lb

## 2018-05-03 DIAGNOSIS — I129 Hypertensive chronic kidney disease with stage 1 through stage 4 chronic kidney disease, or unspecified chronic kidney disease: Secondary | ICD-10-CM

## 2018-05-03 DIAGNOSIS — N183 Chronic kidney disease, stage 3 (moderate): Secondary | ICD-10-CM | POA: Diagnosis not present

## 2018-05-03 DIAGNOSIS — Z6826 Body mass index (BMI) 26.0-26.9, adult: Secondary | ICD-10-CM

## 2018-05-03 DIAGNOSIS — E7800 Pure hypercholesterolemia, unspecified: Secondary | ICD-10-CM | POA: Diagnosis not present

## 2018-05-03 DIAGNOSIS — E1122 Type 2 diabetes mellitus with diabetic chronic kidney disease: Secondary | ICD-10-CM | POA: Diagnosis not present

## 2018-05-03 NOTE — Telephone Encounter (Signed)
Error

## 2018-05-03 NOTE — Progress Notes (Signed)
Subjective:     Patient ID: Michelle Cummings , female    DOB: 1950/12/26 , 68 y.o.   MRN: [id number]   Chief Complaint  Patient presents with  . Hypertension  . Diabetes    HPI  Hypertension  This is a chronic problem. The current episode started more than 1 year ago. The problem has been gradually improving since onset. The problem is controlled. Pertinent negatives include no blurred vision or chest pain. Risk factors for coronary artery disease include diabetes mellitus, dyslipidemia and post-menopausal state. Hypertensive end-organ damage includes kidney disease. Identifiable causes of hypertension include chronic renal disease.  Diabetes  She presents for her follow-up diabetic visit. She has type 2 diabetes mellitus. Her disease course has been stable. There are no hypoglycemic associated symptoms. Pertinent negatives for diabetes include no blurred vision and no chest pain. There are no hypoglycemic complications. Diabetic complications include nephropathy. Risk factors for coronary artery disease include diabetes mellitus, dyslipidemia, hypertension and post-menopausal. Her breakfast blood glucose is taken between 9-10 am. Her breakfast blood glucose range is generally 110-130 mg/dl.     Past Medical History:  Diagnosis Date  . Arthritis   . Cataracts, bilateral   . Cervical disc disease   . Chronic kidney disease    Dr. Hillery Hunter yearly for CKD stage II as of 11/26/11  . Diabetes mellitus ([place])   . Eczema   . Glaucoma   . Heart murmur   . History of chicken pox   . Hyperlipidemia   . Hypertension   . Seasonal allergies   . Thyroid nodule      Family History  Problem Relation Age of Onset  . Hypertension Mother   . Early death Father   . Breast cancer Sister   . Healthy Brother      Current Outpatient Medications:  .  amLODipine (NORVASC) 10 MG tablet, Take 1 tablet (10 mg total) by mouth daily., Disp: 90 tablet, Rfl: 0 .  carvedilol (COREG) 6.25 MG tablet, Take  1 tablet (6.25 mg total) by mouth 2 (two) times daily with a meal., Disp: 180 tablet, Rfl: 0 .  Cholecalciferol (VITAMIN D) 1000 UNITS capsule, Take 1,000 Units by mouth 2 (two) times daily., Disp: , Rfl:  .  colchicine 0.6 MG tablet, TAKE 1 TABLET BY MOUTH EVERY DAY, Disp: 30 tablet, Rfl: 4 .  ferrous sulfate 325 (65 FE) MG tablet, Take 1 tablet (325 mg total) by mouth daily with breakfast., Disp: 30 tablet, Rfl: 3 .  latanoprost (XALATAN) 0.005 % ophthalmic solution, place 1 drop into both eyes at bedtime, Disp: , Rfl:  .  ONE TOUCH ULTRA TEST test strip, Use as directed to check blood sugars 2 times per day dx: e11.65, Disp: 100 each, Rfl: 11 .  ONETOUCH DELICA LANCETS 41U MISC, USE TWICE DAILY, Disp: 100 each, Rfl: 0 .  rosuvastatin (CRESTOR) 10 MG tablet, Take 1 tablet (10 mg total) by mouth daily., Disp: 30 tablet, Rfl: 3 .  Semaglutide,0.25 or 0.5MG/DOS, (OZEMPIC, 0.25 OR 0.5 MG/DOSE,) 2 MG/1.5ML SOPN, Inject 0.5 mg into the skin once a week., Disp: 3 pen, Rfl: 1 .  timolol (TIMOPTIC-XR) 0.5 % ophthalmic gel-forming, Place 1 drop into both eyes daily., Disp: , Rfl:  .  valsartan-hydrochlorothiazide (DIOVAN-HCT) 80-12.5 MG tablet, Take 1 tablet by mouth daily., Disp: 90 tablet, Rfl: 0 .  vitamin C (ASCORBIC ACID) 500 MG tablet, Take 1,000 mg by mouth daily., Disp: , Rfl:    No Known Allergies  Review of Systems  Constitutional: Negative.   Eyes: Negative for blurred vision.  Respiratory: Negative.   Cardiovascular: Negative.  Negative for chest pain.  Gastrointestinal: Negative.   Neurological: Negative.   Psychiatric/Behavioral: Negative.      Today's Vitals   05/03/18 1124  BP: 126/82  Pulse: 77  Temp: (!) 97.5 F (36.4 C)  TempSrc: Oral  Weight: 152 lb 6.4 oz (69.1 kg)  Height: 5' 3.6" (1.615 m)  PainSc: 0-No pain   Body mass index is 26.49 kg/m.   Objective:  Physical Exam Vitals signs and nursing note reviewed.  Constitutional:      Appearance: Normal  appearance.  HENT:     Head: Normocephalic and atraumatic.  Cardiovascular:     Rate and Rhythm: Normal rate and regular rhythm.     Heart sounds: Normal heart sounds.  Pulmonary:     Effort: Pulmonary effort is normal.     Breath sounds: Normal breath sounds.  Skin:    General: Skin is warm.  Neurological:     General: No focal deficit present.     Mental Status: She is alert.  Psychiatric:        Mood and Affect: Mood normal.        Behavior: Behavior normal.         Assessment And Plan:     1. Hypertensive nephropathy  Well controlled. Improved from last visit. She was congratulated on her improvement.  She will continue with current meds. She is encouraged to avoid adding salt to her foods. She was also given information on the DASH diet.   2. Type 2 diabetes mellitus with stage 3 chronic kidney disease, without long-term current use of insulin ([place])  I will check labs as listed below.  I will check labs as listed below. She will rto in July 2020 for her next AWV.   - Lipid Profile - Hemoglobin A1c - BMP8+EGFR  3. Pure hypercholesterolemia  I will check a fasting lipid panel and LFTs. She is encouraged to avoid fried foods and exercise no less than four to five days weekly.   4. Body mass index (bmi) 26.0-26.9, adult    [name], MD    THE PATIENT IS ENCOURAGED TO PRACTICE SOCIAL DISTANCING DUE TO THE COVID-19 PANDEMIC.

## 2018-05-03 NOTE — Patient Instructions (Signed)
DASH Eating Plan  DASH stands for "Dietary Approaches to Stop Hypertension." The DASH eating plan is a healthy eating plan that has been shown to reduce high blood pressure (hypertension). It may also reduce your risk for type 2 diabetes, heart disease, and stroke. The DASH eating plan may also help with weight loss.  What are tips for following this plan?    General guidelines   Avoid eating more than 2,300 mg (milligrams) of salt (sodium) a day. If you have hypertension, you may need to reduce your sodium intake to 1,500 mg a day.   Limit alcohol intake to no more than 1 drink a day for nonpregnant women and 2 drinks a day for men. One drink equals 12 oz of beer, 5 oz of wine, or 1 oz of hard liquor.   Work with your health care provider to maintain a healthy body weight or to lose weight. Ask what an ideal weight is for you.   Get at least 30 minutes of exercise that causes your heart to beat faster (aerobic exercise) most days of the week. Activities may include walking, swimming, or biking.   Work with your health care provider or diet and nutrition specialist (dietitian) to adjust your eating plan to your individual calorie needs.  Reading food labels     Check food labels for the amount of sodium per serving. Choose foods with less than 5 percent of the Daily Value of sodium. Generally, foods with less than 300 mg of sodium per serving fit into this eating plan.   To find whole grains, look for the word "whole" as the first word in the ingredient list.  Shopping   Buy products labeled as "low-sodium" or "no salt added."   Buy fresh foods. Avoid canned foods and premade or frozen meals.  Cooking   Avoid adding salt when cooking. Use salt-free seasonings or herbs instead of table salt or sea salt. Check with your health care provider or pharmacist before using salt substitutes.   Do not fry foods. Cook foods using healthy methods such as baking, boiling, grilling, and broiling instead.   Cook with  heart-healthy oils, such as olive, canola, soybean, or sunflower oil.  Meal planning   Eat a balanced diet that includes:  ? 5 or more servings of fruits and vegetables each day. At each meal, try to fill half of your plate with fruits and vegetables.  ? Up to 6-8 servings of whole grains each day.  ? Less than 6 oz of lean meat, poultry, or fish each day. A 3-oz serving of meat is about the same size as a deck of cards. One egg equals 1 oz.  ? 2 servings of low-fat dairy each day.  ? A serving of nuts, seeds, or beans 5 times each week.  ? Heart-healthy fats. Healthy fats called Omega-3 fatty acids are found in foods such as flaxseeds and coldwater fish, like sardines, salmon, and mackerel.   Limit how much you eat of the following:  ? Canned or prepackaged foods.  ? Food that is high in trans fat, such as fried foods.  ? Food that is high in saturated fat, such as fatty meat.  ? Sweets, desserts, sugary drinks, and other foods with added sugar.  ? Full-fat dairy products.   Do not salt foods before eating.   Try to eat at least 2 vegetarian meals each week.   Eat more home-cooked food and less restaurant, buffet, and fast food.     When eating at a restaurant, ask that your food be prepared with less salt or no salt, if possible.  What foods are recommended?  The items listed may not be a complete list. Talk with your dietitian about what dietary choices are best for you.  Grains  Whole-grain or whole-wheat bread. Whole-grain or whole-wheat pasta. Brown rice. Oatmeal. Quinoa. Bulgur. Whole-grain and low-sodium cereals. Pita bread. Low-fat, low-sodium crackers. Whole-wheat flour tortillas.  Vegetables  Fresh or frozen vegetables (raw, steamed, roasted, or grilled). Low-sodium or reduced-sodium tomato and vegetable juice. Low-sodium or reduced-sodium tomato sauce and tomato paste. Low-sodium or reduced-sodium canned vegetables.  Fruits  All fresh, dried, or frozen fruit. Canned fruit in natural juice (without  added sugar).  Meat and other protein foods  Skinless chicken or turkey. Ground chicken or turkey. Pork with fat trimmed off. Fish and seafood. Egg whites. Dried beans, peas, or lentils. Unsalted nuts, nut butters, and seeds. Unsalted canned beans. Lean cuts of beef with fat trimmed off. Low-sodium, lean deli meat.  Dairy  Low-fat (1%) or fat-free (skim) milk. Fat-free, low-fat, or reduced-fat cheeses. Nonfat, low-sodium ricotta or cottage cheese. Low-fat or nonfat yogurt. Low-fat, low-sodium cheese.  Fats and oils  Soft margarine without trans fats. Vegetable oil. Low-fat, reduced-fat, or light mayonnaise and salad dressings (reduced-sodium). Canola, safflower, olive, soybean, and sunflower oils. Avocado.  Seasoning and other foods  Herbs. Spices. Seasoning mixes without salt. Unsalted popcorn and pretzels. Fat-free sweets.  What foods are not recommended?  The items listed may not be a complete list. Talk with your dietitian about what dietary choices are best for you.  Grains  Baked goods made with fat, such as croissants, muffins, or some breads. Dry pasta or rice meal packs.  Vegetables  Creamed or fried vegetables. Vegetables in a cheese sauce. Regular canned vegetables (not low-sodium or reduced-sodium). Regular canned tomato sauce and paste (not low-sodium or reduced-sodium). Regular tomato and vegetable juice (not low-sodium or reduced-sodium). Pickles. Olives.  Fruits  Canned fruit in a light or heavy syrup. Fried fruit. Fruit in cream or butter sauce.  Meat and other protein foods  Fatty cuts of meat. Ribs. Fried meat. Bacon. Sausage. Bologna and other processed lunch meats. Salami. Fatback. Hotdogs. Bratwurst. Salted nuts and seeds. Canned beans with added salt. Canned or smoked fish. Whole eggs or egg yolks. Chicken or turkey with skin.  Dairy  Whole or 2% milk, cream, and half-and-half. Whole or full-fat cream cheese. Whole-fat or sweetened yogurt. Full-fat cheese. Nondairy creamers. Whipped toppings.  Processed cheese and cheese spreads.  Fats and oils  Butter. Stick margarine. Lard. Shortening. Ghee. Bacon fat. Tropical oils, such as coconut, palm kernel, or palm oil.  Seasoning and other foods  Salted popcorn and pretzels. Onion salt, garlic salt, seasoned salt, table salt, and sea salt. Worcestershire sauce. Tartar sauce. Barbecue sauce. Teriyaki sauce. Soy sauce, including reduced-sodium. Steak sauce. Canned and packaged gravies. Fish sauce. Oyster sauce. Cocktail sauce. Horseradish that you find on the shelf. Ketchup. Mustard. Meat flavorings and tenderizers. Bouillon cubes. Hot sauce and Tabasco sauce. Premade or packaged marinades. Premade or packaged taco seasonings. Relishes. Regular salad dressings.  Where to find more information:   National Heart, Lung, and Blood Institute: www.nhlbi.nih.gov   American Heart Association: www.heart.org  Summary   The DASH eating plan is a healthy eating plan that has been shown to reduce high blood pressure (hypertension). It may also reduce your risk for type 2 diabetes, heart disease, and stroke.   With the   DASH eating plan, you should limit salt (sodium) intake to 2,300 mg a day. If you have hypertension, you may need to reduce your sodium intake to 1,500 mg a day.   When on the DASH eating plan, aim to eat more fresh fruits and vegetables, whole grains, lean proteins, low-fat dairy, and heart-healthy fats.   Work with your health care provider or diet and nutrition specialist (dietitian) to adjust your eating plan to your individual calorie needs.  This information is not intended to replace advice given to you by your health care provider. Make sure you discuss any questions you have with your health care provider.  Document Released: 12/11/2010 Document Revised: 12/16/2015 Document Reviewed: 12/16/2015  Elsevier Interactive Patient Education  2019 [employer] Inc[employer]

## 2018-05-04 LAB — BMP8+EGFR
BUN/Creatinine Ratio: 29 — ABNORMAL HIGH (ref 12–28)
BUN: 47 mg/dL — ABNORMAL HIGH (ref 8–27)
CO2: 20 mmol/L (ref 20–29)
Calcium: 9.9 mg/dL (ref 8.7–10.3)
Chloride: 99 mmol/L (ref 96–106)
Creatinine, Ser: 1.61 mg/dL — ABNORMAL HIGH (ref 0.57–1.00)
GFR calc Af Amer: 37 mL/min/{1.73_m2} — ABNORMAL LOW (ref >59)
GFR calc non Af Amer: 32 mL/min/{1.73_m2} — ABNORMAL LOW (ref >59)
Glucose: 91 mg/dL (ref 65–99)
Potassium: 5 mmol/L (ref 3.5–5.2)
Sodium: 135 mmol/L (ref 134–144)

## 2018-05-04 LAB — LIPID PANEL
Chol/HDL Ratio: 3.4 ratio (ref 0.0–4.4)
Cholesterol, Total: 229 mg/dL — ABNORMAL HIGH (ref 100–199)
HDL: 67 mg/dL (ref >39)
LDL Calculated: 144 mg/dL — ABNORMAL HIGH (ref 0–99)
Triglycerides: 91 mg/dL (ref 0–149)
VLDL Cholesterol Cal: 18 mg/dL (ref 5–40)

## 2018-05-04 LAB — HEMOGLOBIN A1C
Est. average glucose Bld gHb Est-mCnc: 140 mg/dL
Hgb A1c MFr Bld: 6.5 % — ABNORMAL HIGH (ref 4.8–5.6)

## 2018-05-20 ENCOUNTER — Other Ambulatory Visit: Payer: Self-pay | Admitting: Internal Medicine

## 2018-05-26 ENCOUNTER — Other Ambulatory Visit: Payer: Self-pay

## 2018-05-26 ENCOUNTER — Ambulatory Visit (INDEPENDENT_AMBULATORY_CARE_PROVIDER_SITE_OTHER): Payer: Medicare Other

## 2018-05-26 VITALS — Ht 64.0 in | Wt 152.0 lb

## 2018-05-26 DIAGNOSIS — Z23 Encounter for immunization: Secondary | ICD-10-CM | POA: Diagnosis not present

## 2018-05-26 DIAGNOSIS — Z0000 Encounter for general adult medical examination without abnormal findings: Secondary | ICD-10-CM

## 2018-05-26 MED ORDER — PNEUMOCOCCAL 13-VAL CONJ VACC IM SUSP: 0.5000 mL | INTRAMUSCULAR | 0 refills | Status: AC

## 2018-05-26 NOTE — Progress Notes (Signed)
Subjective:   Michelle Cummings is a 68 y.o. female who presents for Medicare Annual (Subsequent) preventive examination.  This visit type was conducted due to national recommendations for restrictions regarding the COVID-19 Pandemic (e.g. social distancing). This format is felt to be most appropriate for this patient at this time. All issues noted in this document were discussed and addressed. No physical exam was performed (except for noted visual exam findings with Video Visits). This patient, Michelle Cummings, has given permission to perform this visit via telephone. Vital signs may be absent or patient reported.  Patient location:  At home  Nurse location:  [place] office     Review of Systems:  n/a Cardiac Risk Factors include: advanced age (>26men, >32 women);diabetes mellitus;hypertension     Objective:     Vitals: Ht 5\' 4"  (1.626 m) Comment: per patient  Wt 152 lb (68.9 kg) Comment: per patient  BMI 26.09 kg/m   Body mass index is 26.09 kg/m.  Advanced Directives 05/26/2018 08/04/2017 07/01/2016 07/20/2012 07/13/2012 03/15/2012 11/04/2011  Does Patient Have a Medical Advance Directive? No No No Patient does not have advance directive Patient does not have advance directive;Patient would not like information Patient does not have advance directive;Patient would not like information Patient does not have advance directive  Would patient like information on creating a medical advance directive? - No - Patient declined Yes (MAU/Ambulatory/Procedural Areas - Information given) - - - -  Pre-existing out of facility DNR order (yellow form or pink MOST form) - - - - - No -    Tobacco Social History   Tobacco Use  Smoking Status Never Smoker  Smokeless Tobacco Never Used     Counseling given: Not Answered   Clinical Intake:  Pre-visit preparation completed: Yes  Pain : No/denies pain Pain Score: 0-No pain     Diabetes: Yes CBG done?: No Did pt. bring in CBG monitor from home?:  No  How often do you need to have someone help you when you read instructions, pamphlets, or other written materials from your doctor or pharmacy?: 1 - Never What is the last grade level you completed in school?: some college  Interpreter Needed?: No  Information entered by :: NAllen LPN  Past Medical History:  Diagnosis Date  . Arthritis   . Cataracts, bilateral   . Cervical disc disease   . Chronic kidney disease    Dr. Hillery Hunter yearly for CKD stage II as of 11/26/11  . Diabetes mellitus ([place])   . Eczema   . Glaucoma   . Heart murmur   . History of chicken pox   . Hyperlipidemia   . Hypertension   . Seasonal allergies   . Thyroid nodule    Past Surgical History:  Procedure Laterality Date  . [place]  . COLONOSCOPY    . ORIF ANKLE FRACTURE  11/05/2011   Procedure: OPEN REDUCTION INTERNAL FIXATION (ORIF) ANKLE FRACTURE;  Surgeon: Wylene Simmer, MD;  Location: [facility];  Service: Orthopedics;  Laterality: Left;  OPEN REDUCTION INTERNAL FIXATION LEFT LATERAL MALLEOLUS FRACTURE WITH STRESS XRAYS WITH FLOUROSCOPY  . ORIF ELBOW FRACTURE  03/05/2011   Procedure: OPEN REDUCTION INTERNAL FIXATION (ORIF) ELBOW/OLECRANON FRACTURE;  Surgeon: Linna Hoff, MD;  Location: [facility];  Service: Orthopedics;  Laterality: Left;  . THYROID LOBECTOMY Right 07/20/2012  . THYROIDECTOMY Right 07/20/2012   Procedure: RIGHT THYROID LOBECTOMY WITH ISTHMUSECTOMY;  Surgeon: Izora Gala, MD;  Location: [facility];  Service: ENT;  Laterality: Right;  . TONSILLECTOMY    . TUBAL LIGATION     Family History  Problem Relation Age of Onset  . Hypertension Mother   . Early death Father   . Breast cancer Sister   . Healthy Brother    Social History   Socioeconomic History  . Marital status: Married    Spouse name: Not on file  . Number of children: Not on file  . Years of education: Not on file  . Highest education level: Not on file  Occupational History  . Occupation:  retired  Scientific laboratory technician  . Financial resource strain: Not hard at all  . Food insecurity:    Worry: Never true    Inability: Never true  . Transportation needs:    Medical: No    Non-medical: No  Tobacco Use  . Smoking status: Never Smoker  . Smokeless tobacco: Never Used  Substance and Sexual Activity  . Alcohol use: No  . Drug use: No  . Sexual activity: Yes    Birth control/protection: Surgical    Comment: BTL  Lifestyle  . Physical activity:    Days per week: 7 days    Minutes per session: 30 min  . Stress: Not at all  Relationships  . Social connections:    Talks on phone: Not on file    Gets together: Not on file    Attends religious service: Not on file    Active member of club or organization: Not on file    Attends meetings of clubs or organizations: Not on file    Relationship status: Not on file  Other Topics Concern  . Not on file  Social History Narrative  . Not on file    Outpatient Encounter Medications as of 05/26/2018  Medication Sig  . amLODipine (NORVASC) 10 MG tablet Take 1 tablet (10 mg total) by mouth daily.  . carvedilol (COREG) 6.25 MG tablet Take 1 tablet (6.25 mg total) by mouth 2 (two) times daily with a meal.  . Cholecalciferol (VITAMIN D) 1000 UNITS capsule Take 1,000 Units by mouth 2 (two) times daily.  . colchicine 0.6 MG tablet TAKE 1 TABLET BY MOUTH EVERY DAY  . ferrous sulfate 325 (65 FE) MG tablet Take 1 tablet (325 mg total) by mouth daily with breakfast.  . Lancets (ONETOUCH DELICA PLUS DXIPJA25K) MISC USE 2 TIMES A DAY  . latanoprost (XALATAN) 0.005 % ophthalmic solution place 1 drop into both eyes at bedtime  . ONE TOUCH ULTRA TEST test strip Use as directed to check blood sugars 2 times per day dx: e11.65  . rosuvastatin (CRESTOR) 10 MG tablet Take 1 tablet (10 mg total) by mouth daily.  . Semaglutide,0.25 or 0.5MG /DOS, (OZEMPIC, 0.25 OR 0.5 MG/DOSE,) 2 MG/1.5ML SOPN Inject 0.5 mg into the skin once a week.  . timolol (TIMOPTIC-XR)  0.5 % ophthalmic gel-forming Place 1 drop into both eyes daily.  . valsartan-hydrochlorothiazide (DIOVAN-HCT) 80-12.5 MG tablet Take 1 tablet by mouth daily.  . vitamin C (ASCORBIC ACID) 500 MG tablet Take 1,000 mg by mouth daily.  Marland Kitchen allopurinol (ZYLOPRIM) 100 MG tablet TK 2 TS PO D  . omeprazole (PRILOSEC) 40 MG capsule TK 1 C PO 20 MIN B BRE  . pneumococcal 13-valent conjugate vaccine (PREVNAR 13) SUSP injection Inject 0.5 mLs into the muscle tomorrow at 10 am for 1 dose.   No facility-administered encounter medications on file as of 05/26/2018.     Activities of Daily Living In your present  state of health, do you have any difficulty performing the following activities: 05/26/2018  Hearing? N  Vision? N  Difficulty concentrating or making decisions? N  Walking or climbing stairs? N  Dressing or bathing? N  Doing errands, shopping? N  Preparing Food and eating ? N  Using the Toilet? N  In the past six months, have you accidently leaked urine? N  Do you have problems with loss of bowel control? N  Managing your Medications? N  Managing your Finances? N  Housekeeping or managing your Housekeeping? N  Some recent data might be hidden    Patient Care Team: Glendale Chard, MD as PCP - General (Internal Medicine) Juanita Craver, MD as Consulting Physician (Gastroenterology) Corliss Parish, MD as Consulting Physician (Nephrology) Warden Fillers, MD as Consulting Physician (Ophthalmology) Marygrace Drought, MD as Consulting Physician (Ophthalmology)    Assessment:   This is a routine wellness examination for [facility]  Exercise Activities and Dietary recommendations Current Exercise Habits: Home exercise routine, Type of exercise: walking;stretching, Time (Minutes): 30, Frequency (Times/Week): 7, Weekly Exercise (Minutes/Week): 210, Intensity: Moderate  Goals    . Eat healthy, exerise and decrease amount of medications    . Patient Stated     Keep blood sugar under control with  diet and exercise       Fall Risk Fall Risk  05/26/2018 05/03/2018 02/01/2018 12/14/2017 11/01/2017  Falls in the past year? 0 0 0 0 No  Risk for fall due to : Medication side effect - - - -  Follow up Falls prevention discussed - - - -   Is the patient's home free of loose throw rugs in walkways, pet beds, electrical cords, etc?   yes      Grab bars in the bathroom? no      Handrails on the stairs? n/a      Adequate lighting?   yes  Timed Get Up and Go performed: n/a  Depression Screen PHQ 2/9 Scores 05/26/2018 05/03/2018 02/01/2018 12/14/2017  PHQ - 2 Score 0 0 0 0  PHQ- 9 Score 0 - - -     Cognitive Function     6CIT Screen 05/26/2018  What Year? 0 points  What month? 0 points  What time? 0 points  Count back from 20 0 points  Months in reverse 0 points  Repeat phrase 0 points  Total Score 0    Immunization History  Administered Date(s) Administered  . Pneumococcal Conjugate-13 11/13/2015  . Tdap 03/05/2011    Qualifies for Shingles Vaccine? yes  Screening Tests Health Maintenance  Topic Date Due  . PNA vac Low Risk Adult (2 of 2 - PPSV23) 11/12/2016  . FOOT EXAM  07/02/2017  . OPHTHALMOLOGY EXAM  07/07/2017  . INFLUENZA VACCINE  08/06/2018  . HEMOGLOBIN A1C  11/02/2018  . MAMMOGRAM  01/19/2020  . TETANUS/TDAP  10/25/2022  . COLONOSCOPY  02/01/2028  . DEXA SCAN  Completed  . Hepatitis C Screening  Completed    Cancer Screenings: Lung: Low Dose CT Chest recommended if Age 77-80 years, 30 pack-year currently smoking OR have quit w/in 15years. Patient does not qualify. Breast:  Up to date on Mammogram? Yes   Up to date of Bone Density/Dexa? Yes Colorectal: up to date  Additional Screenings: : Hepatitis C Screening: 01/29/2012     Plan:    Wants to keep blood sugar under control with diet and exercise.   I have personally reviewed and noted the following in the patient's chart:   .  Medical and social history . Use of alcohol, tobacco or illicit drugs   . Current medications and supplements . Functional ability and status . Nutritional status . Physical activity . Advanced directives . List of other physicians . Hospitalizations, surgeries, and ER visits in previous 12 months . Vitals . Screenings to include cognitive, depression, and falls . Referrals and appointments  In addition, I have reviewed and discussed with patient certain preventive protocols, quality metrics, and best practice recommendations. A written personalized care plan for preventive services as well as general preventive health recommendations were provided to patient.     Kellie Simmering, LPN  [id number]

## 2018-05-26 NOTE — Patient Instructions (Signed)
Michelle Cummings , Thank you for taking time to come for your Medicare Wellness Visit. I appreciate your ongoing commitment to your health goals. Please review the following plan we discussed and let me know if I can assist you in the future.   Screening recommendations/referrals: Colonoscopy: 01/2018 Mammogram: 01/2018 Bone Density: 01/2017 Recommended yearly ophthalmology/optometry visit for glaucoma screening and checkup Recommended yearly dental visit for hygiene and checkup  Vaccinations: Influenza vaccine: declines Pneumococcal vaccine: 11/2015 Tdap vaccine: 02/2011 Shingles vaccine: 09/2012    Advanced directives: Advance directive discussed with you today.   Conditions/risks identified: Overweight  Next appointment: 07/07/2018 at 9:30   Preventive Care 69 Years and Older, Female Preventive care refers to lifestyle choices and visits with your health care provider that can promote health and wellness. What does preventive care include?  A yearly physical exam. This is also called an annual well check.  Dental exams once or twice a year.  Routine eye exams. Ask your health care provider how often you should have your eyes checked.  Personal lifestyle choices, including:  Daily care of your teeth and gums.  Regular physical activity.  Eating a healthy diet.  Avoiding tobacco and drug use.  Limiting alcohol use.  Practicing safe sex.  Taking low-dose aspirin every day.  Taking vitamin and mineral supplements as recommended by your health care provider. What happens during an annual well check? The services and screenings done by your health care provider during your annual well check will depend on your age, overall health, lifestyle risk factors, and family history of disease. Counseling  Your health care provider may ask you questions about your:  Alcohol use.  Tobacco use.  Drug use.  Emotional well-being.  Home and relationship well-being.  Sexual activity.   Eating habits.  History of falls.  Memory and ability to understand (cognition).  Work and work Statistician.  Reproductive health. Screening  You may have the following tests or measurements:  Height, weight, and BMI.  Blood pressure.  Lipid and cholesterol levels. These may be checked every 5 years, or more frequently if you are over 75 years old.  Skin check.  Lung cancer screening. You may have this screening every year starting at age 74 if you have a 30-pack-year history of smoking and currently smoke or have quit within the past 15 years.  Fecal occult blood test (FOBT) of the stool. You may have this test every year starting at age 55.  Flexible sigmoidoscopy or colonoscopy. You may have a sigmoidoscopy every 5 years or a colonoscopy every 10 years starting at age 37.  Hepatitis C blood test.  Hepatitis B blood test.  Sexually transmitted disease (STD) testing.  Diabetes screening. This is done by checking your blood sugar (glucose) after you have not eaten for a while (fasting). You may have this done every 1-3 years.  Bone density scan. This is done to screen for osteoporosis. You may have this done starting at age 30.  Mammogram. This may be done every 1-2 years. Talk to your health care provider about how often you should have regular mammograms. Talk with your health care provider about your test results, treatment options, and if necessary, the need for more tests. Vaccines  Your health care provider may recommend certain vaccines, such as:  Influenza vaccine. This is recommended every year.  Tetanus, diphtheria, and acellular pertussis (Tdap, Td) vaccine. You may need a Td booster every 10 years.  Zoster vaccine. You may need this after age  60.  Pneumococcal 13-valent conjugate (PCV13) vaccine. One dose is recommended after age 64.  Pneumococcal polysaccharide (PPSV23) vaccine. One dose is recommended after age 43. Talk to your health care provider  about which screenings and vaccines you need and how often you need them. This information is not intended to replace advice given to you by your health care provider. Make sure you discuss any questions you have with your health care provider. Document Released: 01/18/2015 Document Revised: 09/11/2015 Document Reviewed: 10/23/2014 Elsevier Interactive Patient Education  2017 [place] Prevention in the Home Falls can cause injuries. They can happen to people of all ages. There are many things you can do to make your home safe and to help prevent falls. What can I do on the outside of my home?  Regularly fix the edges of walkways and driveways and fix any cracks.  Remove anything that might make you trip as you walk through a door, such as a raised step or threshold.  Trim any bushes or trees on the path to your home.  Use bright outdoor lighting.  Clear any walking paths of anything that might make someone trip, such as rocks or tools.  Regularly check to see if handrails are loose or broken. Make sure that both sides of any steps have handrails.  Any raised decks and porches should have guardrails on the edges.  Have any leaves, snow, or ice cleared regularly.  Use sand or salt on walking paths during winter.  Clean up any spills in your garage right away. This includes oil or grease spills. What can I do in the bathroom?  Use night lights.  Install grab bars by the toilet and in the tub and shower. Do not use towel bars as grab bars.  Use non-skid mats or decals in the tub or shower.  If you need to sit down in the shower, use a plastic, non-slip stool.  Keep the floor dry. Clean up any water that spills on the floor as soon as it happens.  Remove soap buildup in the tub or shower regularly.  Attach bath mats securely with double-sided non-slip rug tape.  Do not have throw rugs and other things on the floor that can make you trip. What can I do in the  bedroom?  Use night lights.  Make sure that you have a light by your bed that is easy to reach.  Do not use any sheets or blankets that are too big for your bed. They should not hang down onto the floor.  Have a firm chair that has side arms. You can use this for support while you get dressed.  Do not have throw rugs and other things on the floor that can make you trip. What can I do in the kitchen?  Clean up any spills right away.  Avoid walking on wet floors.  Keep items that you use a lot in easy-to-reach places.  If you need to reach something above you, use a strong step stool that has a grab bar.  Keep electrical cords out of the way.  Do not use floor polish or wax that makes floors slippery. If you must use wax, use non-skid floor wax.  Do not have throw rugs and other things on the floor that can make you trip. What can I do with my stairs?  Do not leave any items on the stairs.  Make sure that there are handrails on both sides of the stairs and  use them. Fix handrails that are broken or loose. Make sure that handrails are as long as the stairways.  Check any carpeting to make sure that it is firmly attached to the stairs. Fix any carpet that is loose or worn.  Avoid having throw rugs at the top or bottom of the stairs. If you do have throw rugs, attach them to the floor with carpet tape.  Make sure that you have a light switch at the top of the stairs and the bottom of the stairs. If you do not have them, ask someone to add them for you. What else can I do to help prevent falls?  Wear shoes that:  Do not have high heels.  Have rubber bottoms.  Are comfortable and fit you well.  Are closed at the toe. Do not wear sandals.  If you use a stepladder:  Make sure that it is fully opened. Do not climb a closed stepladder.  Make sure that both sides of the stepladder are locked into place.  Ask someone to hold it for you, if possible.  Clearly mark and make  sure that you can see:  Any grab bars or handrails.  First and last steps.  Where the edge of each step is.  Use tools that help you move around (mobility aids) if they are needed. These include:  Canes.  Walkers.  Scooters.  Crutches.  Turn on the lights when you go into a dark area. Replace any light bulbs as soon as they burn out.  Set up your furniture so you have a clear path. Avoid moving your furniture around.  If any of your floors are uneven, fix them.  If there are any pets around you, be aware of where they are.  Review your medicines with your doctor. Some medicines can make you feel dizzy. This can increase your chance of falling. Ask your doctor what other things that you can do to help prevent falls. This information is not intended to replace advice given to you by your health care provider. Make sure you discuss any questions you have with your health care provider. Document Released: 10/18/2008 Document Revised: 05/30/2015 Document Reviewed: 01/26/2014 Elsevier Interactive Patient Education  2017 [employer].

## 2018-06-01 ENCOUNTER — Telehealth: Payer: Self-pay

## 2018-06-01 NOTE — Telephone Encounter (Signed)
The pt's mychart was deactivated at her request because she said she didn't want to use it.  The pt was given her most recent lab results.     Your LDL, bad chol, is 144. As a diabetic, it should be less than 70. How are you taking your cholesterol medication? I do think we need to change the dosing schedule.   Your hba1c is 6.5, this is great! Your kidney function is stable. May you and your family remain covered and safe!  The pt said that she said she hasn't been taking cholesterol med in about 2 months and was told to wait until after her lab results when she told Dr. Baird Cancer a refill because the milligrams might need to be changed.

## 2018-07-07 ENCOUNTER — Other Ambulatory Visit: Payer: Self-pay

## 2018-07-07 ENCOUNTER — Ambulatory Visit: Payer: Self-pay

## 2018-07-07 ENCOUNTER — Encounter: Payer: Self-pay | Admitting: Internal Medicine

## 2018-07-07 ENCOUNTER — Ambulatory Visit (INDEPENDENT_AMBULATORY_CARE_PROVIDER_SITE_OTHER): Payer: Medicare Other | Admitting: Internal Medicine

## 2018-07-07 VITALS — BP 138/78 | HR 74 | Temp 97.8°F | Ht 63.6 in | Wt 155.8 lb

## 2018-07-07 DIAGNOSIS — E663 Overweight: Secondary | ICD-10-CM | POA: Diagnosis not present

## 2018-07-07 DIAGNOSIS — N183 Chronic kidney disease, stage 3 unspecified: Secondary | ICD-10-CM

## 2018-07-07 DIAGNOSIS — E1122 Type 2 diabetes mellitus with diabetic chronic kidney disease: Secondary | ICD-10-CM | POA: Diagnosis not present

## 2018-07-07 DIAGNOSIS — I129 Hypertensive chronic kidney disease with stage 1 through stage 4 chronic kidney disease, or unspecified chronic kidney disease: Secondary | ICD-10-CM

## 2018-07-07 LAB — BMP8+EGFR
BUN/Creatinine Ratio: 28 (ref 12–28)
BUN: 55 mg/dL — ABNORMAL HIGH (ref 8–27)
CO2: 22 mmol/L (ref 20–29)
Calcium: 9.8 mg/dL (ref 8.7–10.3)
Chloride: 100 mmol/L (ref 96–106)
Creatinine, Ser: 2 mg/dL — ABNORMAL HIGH (ref 0.57–1.00)
GFR calc Af Amer: 29 mL/min/{1.73_m2} — ABNORMAL LOW (ref >59)
GFR calc non Af Amer: 25 mL/min/{1.73_m2} — ABNORMAL LOW (ref >59)
Glucose: 115 mg/dL — ABNORMAL HIGH (ref 65–99)
Potassium: 4.9 mmol/L (ref 3.5–5.2)
Sodium: 136 mmol/L (ref 134–144)

## 2018-07-07 LAB — HEMOGLOBIN A1C
Est. average glucose Bld gHb Est-mCnc: 143 mg/dL
Hgb A1c MFr Bld: 6.6 % — ABNORMAL HIGH (ref 4.8–5.6)

## 2018-07-07 NOTE — Patient Instructions (Signed)
Diabetes Mellitus and Foot Care Foot care is an important part of your health, especially when you have diabetes. Diabetes may cause you to have problems because of poor blood flow (circulation) to your feet and legs, which can cause your skin to:  Become thinner and drier.  Break more easily.  Heal more slowly.  Peel and crack. You may also have nerve damage (neuropathy) in your legs and feet, causing decreased feeling in them. This means that you may not notice minor injuries to your feet that could lead to more serious problems. Noticing and addressing any potential problems early is the best way to prevent future foot problems. How to care for your feet Foot hygiene  Wash your feet daily with warm water and mild soap. Do not use hot water. Then, pat your feet and the areas between your toes until they are completely dry. Do not soak your feet as this can dry your skin.  Trim your toenails straight across. Do not dig under them or around the cuticle. File the edges of your nails with an emery board or nail file.  Apply a moisturizing lotion or petroleum jelly to the skin on your feet and to dry, brittle toenails. Use lotion that does not contain alcohol and is unscented. Do not apply lotion between your toes. Shoes and socks  Wear clean socks or stockings every day. Make sure they are not too tight. Do not wear knee-high stockings since they may decrease blood flow to your legs.  Wear shoes that fit properly and have enough cushioning. Always look in your shoes before you put them on to be sure there are no objects inside.  To break in new shoes, wear them for just a few hours a day. This prevents injuries on your feet. Wounds, scrapes, corns, and calluses  Check your feet daily for blisters, cuts, bruises, sores, and redness. If you cannot see the bottom of your feet, use a mirror or ask someone for help.  Do not cut corns or calluses or try to remove them with medicine.  If you  find a minor scrape, cut, or break in the skin on your feet, keep it and the skin around it clean and dry. You may clean these areas with mild soap and water. Do not clean the area with peroxide, alcohol, or iodine.  If you have a wound, scrape, corn, or callus on your foot, look at it several times a day to make sure it is healing and not infected. Check for: ? Redness, swelling, or pain. ? Fluid or blood. ? Warmth. ? Pus or a bad smell. General instructions  Do not cross your legs. This may decrease blood flow to your feet.  Do not use heating pads or hot water bottles on your feet. They may burn your skin. If you have lost feeling in your feet or legs, you may not know this is happening until it is too late.  Protect your feet from hot and cold by wearing shoes, such as at the beach or on hot pavement.  Schedule a complete foot exam at least once a year (annually) or more often if you have foot problems. If you have foot problems, report any cuts, sores, or bruises to your health care provider immediately. Contact a health care provider if:  You have a medical condition that increases your risk of infection and you have any cuts, sores, or bruises on your feet.  You have an injury that is not   healing.  You have redness on your legs or feet.  You feel burning or tingling in your legs or feet.  You have pain or cramps in your legs and feet.  Your legs or feet are numb.  Your feet always feel cold.  You have pain around a toenail. Get help right away if:  You have a wound, scrape, corn, or callus on your foot and: ? You have pain, swelling, or redness that gets worse. ? You have fluid or blood coming from the wound, scrape, corn, or callus. ? Your wound, scrape, corn, or callus feels warm to the touch. ? You have pus or a bad smell coming from the wound, scrape, corn, or callus. ? You have a fever. ? You have a red line going up your leg. Summary  Check your feet every day  for cuts, sores, red spots, swelling, and blisters.  Moisturize feet and legs daily.  Wear shoes that fit properly and have enough cushioning.  If you have foot problems, report any cuts, sores, or bruises to your health care provider immediately.  Schedule a complete foot exam at least once a year (annually) or more often if you have foot problems. This information is not intended to replace advice given to you by your health care provider. Make sure you discuss any questions you have with your health care provider. Document Released: 12/20/1999 Document Revised: 02/03/2017 Document Reviewed: 01/24/2016 Elsevier Patient Education  2020 Elsevier Inc.  

## 2018-07-09 NOTE — Progress Notes (Signed)
Subjective:     Patient ID: Michelle Cummings , female    DOB: 1950-12-23 , 68 y.o.   MRN: [id number]   Chief Complaint  Patient presents with  . Diabetes  . Hypertension    HPI  Diabetes She presents for her follow-up diabetic visit. She has type 2 diabetes mellitus. Her disease course has been stable. There are no hypoglycemic associated symptoms. Pertinent negatives for diabetes include no blurred vision and no chest pain. There are no hypoglycemic complications. Diabetic complications include nephropathy. Risk factors for coronary artery disease include diabetes mellitus, dyslipidemia, hypertension and post-menopausal. She is following a diabetic diet. She participates in exercise three times a week. Her breakfast blood glucose is taken between 9-10 am. Her breakfast blood glucose range is generally 110-130 mg/dl. An ACE inhibitor/angiotensin II receptor blocker is being taken.  Hypertension This is a chronic problem. The current episode started more than 1 year ago. The problem has been gradually improving since onset. The problem is controlled. Pertinent negatives include no blurred vision or chest pain. Past treatments include angiotensin blockers, diuretics and ACE inhibitors. The current treatment provides moderate improvement. Hypertensive end-organ damage includes kidney disease.     Past Medical History:  Diagnosis Date  . Arthritis   . Cataracts, bilateral   . Cervical disc disease   . Chronic kidney disease    Dr. Hillery Hunter yearly for CKD stage II as of 11/26/11  . Diabetes mellitus ([place])   . Eczema   . Glaucoma   . Heart murmur   . History of chicken pox   . Hyperlipidemia   . Hypertension   . Seasonal allergies   . Thyroid nodule      Family History  Problem Relation Age of Onset  . Hypertension Mother   . Early death Father   . Breast cancer Sister   . Healthy Brother      Current Outpatient Medications:  .  allopurinol (ZYLOPRIM) 100 MG tablet, TK 2 TS PO  D, Disp: , Rfl:  .  amLODipine (NORVASC) 10 MG tablet, Take 1 tablet (10 mg total) by mouth daily., Disp: 90 tablet, Rfl: 0 .  carvedilol (COREG) 6.25 MG tablet, Take 1 tablet (6.25 mg total) by mouth 2 (two) times daily with a meal., Disp: 180 tablet, Rfl: 0 .  Cholecalciferol (VITAMIN D) 1000 UNITS capsule, Take 1,000 Units by mouth 2 (two) times daily., Disp: , Rfl:  .  colchicine 0.6 MG tablet, TAKE 1 TABLET BY MOUTH EVERY DAY, Disp: 30 tablet, Rfl: 4 .  ferrous sulfate 325 (65 FE) MG tablet, Take 1 tablet (325 mg total) by mouth daily with breakfast., Disp: 30 tablet, Rfl: 3 .  Lancets (ONETOUCH DELICA PLUS [id number]) MISC, USE 2 TIMES A DAY, Disp: 100 each, Rfl: 0 .  latanoprost (XALATAN) 0.005 % ophthalmic solution, place 1 drop into both eyes at bedtime, Disp: , Rfl:  .  omeprazole (PRILOSEC) 40 MG capsule, TK 1 C PO 20 MIN B BRE, Disp: , Rfl:  .  ONE TOUCH ULTRA TEST test strip, Use as directed to check blood sugars 2 times per day dx: e11.65, Disp: 100 each, Rfl: 11 .  Semaglutide,0.25 or 0.5MG/DOS, (OZEMPIC, 0.25 OR 0.5 MG/DOSE,) 2 MG/1.5ML SOPN, Inject 0.5 mg into the skin once a week., Disp: 3 pen, Rfl: 1 .  timolol (TIMOPTIC-XR) 0.5 % ophthalmic gel-forming, Place 1 drop into both eyes daily., Disp: , Rfl:  .  valsartan-hydrochlorothiazide (DIOVAN-HCT) 80-12.5 MG tablet, Take 1 tablet by  mouth daily., Disp: 90 tablet, Rfl: 0 .  vitamin C (ASCORBIC ACID) 500 MG tablet, Take 1,000 mg by mouth daily., Disp: , Rfl:  .  rosuvastatin (CRESTOR) 10 MG tablet, Take 1 tablet (10 mg total) by mouth daily. (Patient not taking: Reported on 07/07/2018), Disp: 30 tablet, Rfl: 3   No Known Allergies   Review of Systems  Constitutional: Negative.   Eyes: Negative for blurred vision.  Respiratory: Negative.   Cardiovascular: Negative.  Negative for chest pain.  Gastrointestinal: Negative.   Neurological: Negative.   Psychiatric/Behavioral: Negative.      Today's Vitals   07/07/18 1001  BP:  138/78  Pulse: 74  Temp: 97.8 F (36.6 C)  TempSrc: Oral  SpO2: 98%  Weight: 155 lb 12.8 oz (70.7 kg)  Height: 5' 3.6" (1.615 m)   Body mass index is 27.08 kg/m.   Objective:  Physical Exam Vitals signs and nursing note reviewed.  Constitutional:      Appearance: Normal appearance.  HENT:     Head: Normocephalic and atraumatic.  Cardiovascular:     Rate and Rhythm: Normal rate and regular rhythm.     Pulses:          Dorsalis pedis pulses are 2+ on the right side and 2+ on the left side.     Heart sounds: Normal heart sounds.  Pulmonary:     Effort: Pulmonary effort is normal.     Breath sounds: Normal breath sounds.  Feet:     Right foot:     Protective Sensation: 5 sites tested. 5 sites sensed.     Skin integrity: Skin integrity normal.     Toenail Condition: Right toenails are normal.     Left foot:     Protective Sensation: 5 sites tested. 5 sites sensed.     Skin integrity: Skin integrity normal.     Toenail Condition: Left toenails are normal.  Skin:    General: Skin is warm.  Neurological:     General: No focal deficit present.     Mental Status: She is alert.  Psychiatric:        Mood and Affect: Mood normal.        Behavior: Behavior normal.         Assessment And Plan:     1. Type 2 diabetes mellitus with stage 3 chronic kidney disease, without long-term current use of insulin (HCC)  Chronic. Diabetic foot exam was performed. I DISCUSSED WITH THE PATIENT AT LENGTH REGARDING THE GOALS OF GLYCEMIC CONTROL AND POSSIBLE LONG-TERM COMPLICATIONS.  I  ALSO STRESSED THE IMPORTANCE OF COMPLIANCE WITH HOME GLUCOSE MONITORING, DIETARY RESTRICTIONS INCLUDING AVOIDANCE OF SUGARY DRINKS/PROCESSED FOODS,  ALONG WITH REGULAR EXERCISE.  I  ALSO STRESSED THE IMPORTANCE OF ANNUAL EYE EXAMS, SELF FOOT CARE AND COMPLIANCE WITH OFFICE VISITS.  - BMP8+EGFR - Hemoglobin A1c  2. Hypertensive nephropathy  Fair control. She will continue with current meds. She is encouraged  to avoid adding salt to her foods. She is aware goal bp is less than 130/80.   3. Overweight (BMI 25.0-29.9)  Her weight is stable. BMI 27. She is encouraged to exercise no less than 4 days per week. She is encouraged to strive to lose five pounds to decrease cardiac risk.         [name], MD    THE PATIENT IS ENCOURAGED TO PRACTICE SOCIAL DISTANCING DUE TO THE COVID-19 PANDEMIC.

## 2018-07-11 ENCOUNTER — Other Ambulatory Visit: Payer: Self-pay | Admitting: Internal Medicine

## 2018-07-12 ENCOUNTER — Other Ambulatory Visit: Payer: Self-pay | Admitting: Internal Medicine

## 2018-07-14 ENCOUNTER — Other Ambulatory Visit: Payer: Self-pay

## 2018-07-14 MED ORDER — ROSUVASTATIN CALCIUM 10 MG PO TABS: 10.0000 mg | ORAL_TABLET | Freq: Every day | ORAL | 1 refills | Status: DC

## 2018-07-17 ENCOUNTER — Other Ambulatory Visit: Payer: Self-pay | Admitting: Internal Medicine

## 2018-07-19 ENCOUNTER — Telehealth: Payer: Self-pay

## 2018-07-19 NOTE — Telephone Encounter (Signed)
The pt was notified her refill of crestor has been faxed to her pharmacy.

## 2018-07-20 ENCOUNTER — Other Ambulatory Visit: Payer: Self-pay | Admitting: Internal Medicine

## 2018-08-02 ENCOUNTER — Ambulatory Visit: Payer: Self-pay | Admitting: Internal Medicine

## 2018-08-05 ENCOUNTER — Other Ambulatory Visit: Payer: Self-pay

## 2018-08-05 DIAGNOSIS — E1122 Type 2 diabetes mellitus with diabetic chronic kidney disease: Secondary | ICD-10-CM

## 2018-08-08 ENCOUNTER — Ambulatory Visit: Payer: Self-pay

## 2018-08-08 DIAGNOSIS — I129 Hypertensive chronic kidney disease with stage 1 through stage 4 chronic kidney disease, or unspecified chronic kidney disease: Secondary | ICD-10-CM

## 2018-08-08 DIAGNOSIS — E1122 Type 2 diabetes mellitus with diabetic chronic kidney disease: Secondary | ICD-10-CM

## 2018-08-08 NOTE — Chronic Care Management (AMB) (Signed)
  Chronic Care Management   Outreach Note  08/08/2018 Name: Michelle Cummings MRN: [id number] DOB: Apr 27, 1950  Referred by: Glendale Chard, MD Reason for referral : Care Coordination   An unsuccessful telephone outreach was attempted today. The patient was referred to the case management team by for assistance with chronic care management and care coordination.   Follow Up Plan: A HIPPA compliant phone message was left for the patient providing contact information and requesting a return call.  The care management team will reach out to the patient again over the next 10 days.   Daneen Schick, BSW, CDP Social Worker, Certified Dementia Practitioner [place] [facility] [place] [facility] [phone]

## 2018-08-09 ENCOUNTER — Telehealth: Payer: Self-pay

## 2018-08-09 NOTE — Telephone Encounter (Signed)
Spoke with julie about pap for ozempic will contact pt tmrw

## 2018-08-11 ENCOUNTER — Ambulatory Visit: Payer: Self-pay | Admitting: Pharmacist

## 2018-08-11 NOTE — Progress Notes (Signed)
  Chronic Care Management   Outreach Note  08/11/2018 Name: Michelle Cummings MRN: [id number] DOB: 09-06-1950  Referred by: Glendale Chard, MD Reason for referral : Chronic Care Management   An unsuccessful telephone outreach was attempted today. The patient was referred to the case management team by for assistance with chronic care management and care coordination.   Follow Up Plan: A HIPPA compliant phone message was left for the patient providing contact information and requesting a return call.  The care management team will reach out to the patient again over the next 5-7 business days.   Regina Eck, PharmD, BCPS Clinical Pharmacist, [facility] [place]: [phone]

## 2018-08-12 ENCOUNTER — Ambulatory Visit: Payer: Self-pay | Admitting: Pharmacist

## 2018-08-12 DIAGNOSIS — E1122 Type 2 diabetes mellitus with diabetic chronic kidney disease: Secondary | ICD-10-CM

## 2018-08-12 DIAGNOSIS — E7800 Pure hypercholesterolemia, unspecified: Secondary | ICD-10-CM

## 2018-08-12 DIAGNOSIS — I129 Hypertensive chronic kidney disease with stage 1 through stage 4 chronic kidney disease, or unspecified chronic kidney disease: Secondary | ICD-10-CM

## 2018-08-16 ENCOUNTER — Other Ambulatory Visit: Payer: Self-pay | Admitting: Pharmacy Technician

## 2018-08-16 NOTE — Patient Outreach (Signed)
[facility]) Care Management  08/16/2018  Michelle Cummings Jul 22, 1950 [id number]                          Medication Assistance Referral  Referral From: [place] RPh)  Medication/Company: Darlyn Read / [employer] Patient application portion:  Mailed Provider application portion: Faxed  to Dr. Johnnye Lana  Medication/Company: Larna Daughters / [employer] Patient application portion:  Mailed Provider application portion: Faxed  to Dr. Johnnye Lana    Follow up:  Will follow up with patient in 7-10 business days to confirm application(s) have been received.  Maud Deed Chana Bode [place] Certified Pharmacy Technician [place] [facility] Direct Dial:[phone]

## 2018-08-16 NOTE — Progress Notes (Signed)
  Chronic Care Management   Initial Visit Note  08/12/2018 Name: Michelle Cummings MRN: [id number] DOB: 1950-01-16  Referred by: Glendale Chard, MD Reason for referral : Chronic Care Management   Michelle Cummings is a 68 y.o. year old female who is a primary care patient of Glendale Chard, MD. The CCM team was consulted for assistance with chronic disease management and care coordination needs.   Review of patient status, including review of consultants reports, relevant laboratory and other test results, and collaboration with appropriate care team members and the patient's provider was performed as part of comprehensive patient evaluation and provision of chronic care management services.    I spoke with Michelle Cummings by telephone today  Objective:   Goals Addressed            This Visit's Progress     Patient Stated   . I would like to apply for financial assistance for my medications (pt-stated)       Current Barriers:  . Financial Barriers  Pharmacist Clinical Goal(s):  Marland Kitchen Over the next 30 days, patient will work with PharmD & PCP to address needs related to application for medication assistance  Interventions: . Comprehensive medication review performed. . Patient agreeable to participate in application process.  She is unable to afford Ozempic and Colchicine.  Her copays are >$45/month.  She states she is tolerating medication with no side effects. . Collaboration with [facility], Etter Sjogren who will assist with application process for Ozempic and Colchicine.  Patient Self Care Activities:  . Self administers medications as prescribed . Attends all scheduled provider appointments . Calls pharmacy for medication refills . Calls provider office for new concerns or questions  Initial goal documentation         Plan:   The care management team will reach out to the patient again over the next 4 weeks.  Regina Eck, PharmD, BCPS Clinical Pharmacist, [facility] [place]: [phone]

## 2018-09-14 ENCOUNTER — Encounter: Payer: Self-pay | Admitting: Internal Medicine

## 2018-09-16 LAB — HM DIABETES EYE EXAM

## 2018-09-21 ENCOUNTER — Encounter: Payer: Self-pay | Admitting: Internal Medicine

## 2018-10-11 ENCOUNTER — Other Ambulatory Visit: Payer: Self-pay

## 2018-10-11 ENCOUNTER — Encounter: Payer: Self-pay | Admitting: Internal Medicine

## 2018-10-11 ENCOUNTER — Ambulatory Visit: Payer: Medicare Other | Admitting: Internal Medicine

## 2018-10-11 VITALS — BP 140/72 | HR 72 | Temp 97.8°F | Ht 62.8 in | Wt 160.8 lb

## 2018-10-11 DIAGNOSIS — Z0000 Encounter for general adult medical examination without abnormal findings: Secondary | ICD-10-CM

## 2018-10-11 DIAGNOSIS — N183 Chronic kidney disease, stage 3 unspecified: Secondary | ICD-10-CM

## 2018-10-11 DIAGNOSIS — E1122 Type 2 diabetes mellitus with diabetic chronic kidney disease: Secondary | ICD-10-CM

## 2018-10-11 DIAGNOSIS — I129 Hypertensive chronic kidney disease with stage 1 through stage 4 chronic kidney disease, or unspecified chronic kidney disease: Secondary | ICD-10-CM

## 2018-10-11 DIAGNOSIS — Z6828 Body mass index (BMI) 28.0-28.9, adult: Secondary | ICD-10-CM

## 2018-10-11 DIAGNOSIS — E663 Overweight: Secondary | ICD-10-CM

## 2018-10-11 LAB — POCT URINALYSIS DIPSTICK
Bilirubin, UA: NEGATIVE
Blood, UA: NEGATIVE
Glucose, UA: NEGATIVE
Ketones, UA: NEGATIVE
Leukocytes, UA: NEGATIVE
Nitrite, UA: NEGATIVE
Protein, UA: POSITIVE — AB
Spec Grav, UA: 1.01 (ref 1.010–1.025)
Urobilinogen, UA: 0.2 E.U./dL
pH, UA: 5.5 (ref 5.0–8.0)

## 2018-10-11 LAB — POCT UA - MICROALBUMIN
Albumin/Creatinine Ratio, Urine, POC: 300
Creatinine, POC: 100 mg/dL
Microalbumin Ur, POC: 150 mg/L

## 2018-10-11 NOTE — Patient Instructions (Addendum)
Dr. Francee Piccolo - podiatry in [place] Maintenance, Female Adopting a healthy lifestyle and getting preventive care are important in promoting health and wellness. Ask your health care provider about:  The right schedule for you to have regular tests and exams.  Things you can do on your own to prevent diseases and keep yourself healthy. What should I know about diet, weight, and exercise? Eat a healthy diet   Eat a diet that includes plenty of vegetables, fruits, low-fat dairy products, and lean protein.  Do not eat a lot of foods that are high in solid fats, added sugars, or sodium. Maintain a healthy weight Body mass index (BMI) is used to identify weight problems. It estimates body fat based on height and weight. Your health care provider can help determine your BMI and help you achieve or maintain a healthy weight. Get regular exercise Get regular exercise. This is one of the most important things you can do for your health. Most adults should:  Exercise for at least 150 minutes each week. The exercise should increase your heart rate and make you sweat (moderate-intensity exercise).  Do strengthening exercises at least twice a week. This is in addition to the moderate-intensity exercise.  Spend less time sitting. Even light physical activity can be beneficial. Watch cholesterol and blood lipids Have your blood tested for lipids and cholesterol at 69 years of age, then have this test every 5 years. Have your cholesterol levels checked more often if:  Your lipid or cholesterol levels are high.  You are older than 69 years of age.  You are at high risk for heart disease. What should I know about cancer screening? Depending on your health history and family history, you may need to have cancer screening at various ages. This may include screening for:  Breast cancer.  Cervical cancer.  Colorectal cancer.  Skin cancer.  Lung cancer. What should I know about  heart disease, diabetes, and high blood pressure? Blood pressure and heart disease  High blood pressure causes heart disease and increases the risk of stroke. This is more likely to develop in people who have high blood pressure readings, are of African descent, or are overweight.  Have your blood pressure checked: ? Every 3-5 years if you are 75-58 years of age. ? Every year if you are 70 years old or older. Diabetes Have regular diabetes screenings. This checks your fasting blood sugar level. Have the screening done:  Once every three years after age 40 if you are at a normal weight and have a low risk for diabetes.  More often and at a younger age if you are overweight or have a high risk for diabetes. What should I know about preventing infection? Hepatitis B If you have a higher risk for hepatitis B, you should be screened for this virus. Talk with your health care provider to find out if you are at risk for hepatitis B infection. Hepatitis C Testing is recommended for:  Everyone born from [birth year] through 1966.  Anyone with known risk factors for hepatitis C. Sexually transmitted infections (STIs)  Get screened for STIs, including gonorrhea and chlamydia, if: ? You are sexually active and are younger than 69 years of age. ? You are older than 69 years of age and your health care provider tells you that you are at risk for this type of infection. ? Your sexual activity has changed since you were last screened, and you are at increased risk for  chlamydia or gonorrhea. Ask your health care provider if you are at risk.  Ask your health care provider about whether you are at high risk for HIV. Your health care provider may recommend a prescription medicine to help prevent HIV infection. If you choose to take medicine to prevent HIV, you should first get tested for HIV. You should then be tested every 3 months for as long as you are taking the medicine. Pregnancy  If you are about to  stop having your period (premenopausal) and you may become pregnant, seek counseling before you get pregnant.  Take 400 to 800 micrograms (mcg) of folic acid every day if you become pregnant.  Ask for birth control (contraception) if you want to prevent pregnancy. Osteoporosis and menopause Osteoporosis is a disease in which the bones lose minerals and strength with aging. This can result in bone fractures. If you are 38 years old or older, or if you are at risk for osteoporosis and fractures, ask your health care provider if you should:  Be screened for bone loss.  Take a calcium or vitamin D supplement to lower your risk of fractures.  Be given hormone replacement therapy (HRT) to treat symptoms of menopause. Follow these instructions at home: Lifestyle  Do not use any products that contain nicotine or tobacco, such as cigarettes, e-cigarettes, and chewing tobacco. If you need help quitting, ask your health care provider.  Do not use street drugs.  Do not share needles.  Ask your health care provider for help if you need support or information about quitting drugs. Alcohol use  Do not drink alcohol if: ? Your health care provider tells you not to drink. ? You are pregnant, may be pregnant, or are planning to become pregnant.  If you drink alcohol: ? Limit how much you use to 0-1 drink a day. ? Limit intake if you are breastfeeding.  Be aware of how much alcohol is in your drink. In the [place] one drink equals one 12 oz bottle of beer (355 mL), one 5 oz glass of wine (148 mL), or one 1 oz glass of hard liquor (44 mL). General instructions  Schedule regular health, dental, and eye exams.  Stay current with your vaccines.  Tell your health care provider if: ? You often feel depressed. ? You have ever been abused or do not feel safe at home. Summary  Adopting a healthy lifestyle and getting preventive care are important in promoting health and wellness.  Follow your  health care provider's instructions about healthy diet, exercising, and getting tested or screened for diseases.  Follow your health care provider's instructions on monitoring your cholesterol and blood pressure. This information is not intended to replace advice given to you by your health care provider. Make sure you discuss any questions you have with your health care provider. Document Released: 07/07/2010 Document Revised: 12/15/2017 Document Reviewed: 12/15/2017 Elsevier Patient Education  2020 [employer].

## 2018-10-12 LAB — CMP14+EGFR
ALT: 18 IU/L (ref 0–32)
AST: 15 IU/L (ref 0–40)
Albumin/Globulin Ratio: 1.5 (ref 1.2–2.2)
Albumin: 4.4 g/dL (ref 3.8–4.8)
Alkaline Phosphatase: 117 IU/L (ref 39–117)
BUN/Creatinine Ratio: 28 (ref 12–28)
BUN: 60 mg/dL — ABNORMAL HIGH (ref 8–27)
Bilirubin Total: <0.2 mg/dL (ref 0.0–1.2)
CO2: 22 mmol/L (ref 20–29)
Calcium: 10.3 mg/dL (ref 8.7–10.3)
Chloride: 96 mmol/L (ref 96–106)
Creatinine, Ser: 2.12 mg/dL — ABNORMAL HIGH (ref 0.57–1.00)
GFR calc Af Amer: 27 mL/min/{1.73_m2} — ABNORMAL LOW (ref >59)
GFR calc non Af Amer: 23 mL/min/{1.73_m2} — ABNORMAL LOW (ref >59)
Globulin, Total: 2.9 g/dL (ref 1.5–4.5)
Glucose: 110 mg/dL — ABNORMAL HIGH (ref 65–99)
Potassium: 5.3 mmol/L — ABNORMAL HIGH (ref 3.5–5.2)
Sodium: 132 mmol/L — ABNORMAL LOW (ref 134–144)
Total Protein: 7.3 g/dL (ref 6.0–8.5)

## 2018-10-12 LAB — LIPID PANEL
Chol/HDL Ratio: 3.7 ratio (ref 0.0–4.4)
Cholesterol, Total: 203 mg/dL — ABNORMAL HIGH (ref 100–199)
HDL: 55 mg/dL (ref >39)
LDL Chol Calc (NIH): 121 mg/dL — ABNORMAL HIGH (ref 0–99)
Triglycerides: 151 mg/dL — ABNORMAL HIGH (ref 0–149)
VLDL Cholesterol Cal: 27 mg/dL (ref 5–40)

## 2018-10-12 LAB — CBC
Hematocrit: 32.4 % — ABNORMAL LOW (ref 34.0–46.6)
Hemoglobin: 10.5 g/dL — ABNORMAL LOW (ref 11.1–15.9)
MCH: 27.4 pg (ref 26.6–33.0)
MCHC: 32.4 g/dL (ref 31.5–35.7)
MCV: 85 fL (ref 79–97)
Platelets: 340 10*3/uL (ref 150–450)
RBC: 3.83 x10E6/uL (ref 3.77–5.28)
RDW: 12.7 % (ref 11.7–15.4)
WBC: 7 10*3/uL (ref 3.4–10.8)

## 2018-10-12 LAB — HEMOGLOBIN A1C
Est. average glucose Bld gHb Est-mCnc: 146 mg/dL
Hgb A1c MFr Bld: 6.7 % — ABNORMAL HIGH (ref 4.8–5.6)

## 2018-10-15 ENCOUNTER — Other Ambulatory Visit: Payer: Self-pay

## 2018-10-15 DIAGNOSIS — E875 Hyperkalemia: Secondary | ICD-10-CM

## 2018-10-17 ENCOUNTER — Other Ambulatory Visit: Payer: Self-pay

## 2018-10-17 MED ORDER — CARVEDILOL 6.25 MG PO TABS: ORAL_TABLET | ORAL | 0 refills | Status: DC

## 2018-10-17 MED ORDER — VALSARTAN-HYDROCHLOROTHIAZIDE 80-12.5 MG PO TABS: 1.0000 | ORAL_TABLET | Freq: Every day | ORAL | 0 refills | Status: DC

## 2018-10-17 MED ORDER — AMLODIPINE BESYLATE 10 MG PO TABS: ORAL_TABLET | ORAL | 0 refills | Status: DC

## 2018-10-26 ENCOUNTER — Ambulatory Visit: Payer: Self-pay | Admitting: Pharmacist

## 2018-10-26 DIAGNOSIS — N183 Chronic kidney disease, stage 3 unspecified: Secondary | ICD-10-CM

## 2018-10-26 DIAGNOSIS — I10 Essential (primary) hypertension: Secondary | ICD-10-CM

## 2018-10-26 DIAGNOSIS — E1122 Type 2 diabetes mellitus with diabetic chronic kidney disease: Secondary | ICD-10-CM

## 2018-10-30 NOTE — Progress Notes (Signed)
  Chronic Care Management   Outreach Note  10/26/2018 Name: Michelle Cummings MRN: [id number] DOB: 10-Mar-1950  Referred by: Glendale Chard, MD Reason for referral : Chronic Care Management  Successful outreach to patient regarding CCM services.  Patient is scheduled to have labs drawn during PCP visit on 11/07/2018.  She has agreed to meet with CCM PharmD to discuss diabetes and other chronic conditions at that time.  We will explore patient assistance if needed.  Follow Up Plan: Face to Face appointment with care management team member scheduled for: 11/07/2018  SIGNATURE Regina Eck, PharmD, BCPS Clinical Pharmacist, [facility] [place]: [phone]

## 2018-10-30 NOTE — Progress Notes (Signed)
Subjective:     Patient ID: Michelle Cummings , female    DOB: 08/13/1950 , 68 y.o.   MRN: [id number]   Chief Complaint  Patient presents with  . Annual Exam  . Diabetes  . Hypertension    HPI  She is here today for a full physical examination. She is followed by GYN for her pelvic exams. She has no specific concerns or complaints at this time.   Diabetes She presents for her follow-up diabetic visit. She has type 2 diabetes mellitus. Her disease course has been stable. There are no hypoglycemic associated symptoms. Pertinent negatives for diabetes include no blurred vision and no chest pain. There are no hypoglycemic complications. Diabetic complications include nephropathy. Risk factors for coronary artery disease include diabetes mellitus, dyslipidemia, hypertension and post-menopausal. She is following a diabetic diet. She participates in exercise three times a week. Her breakfast blood glucose is taken between 9-10 am. Her breakfast blood glucose range is generally 110-130 mg/dl. An ACE inhibitor/angiotensin II receptor blocker is being taken.  Hypertension This is a chronic problem. The current episode started more than 1 year ago. The problem has been gradually improving since onset. The problem is controlled. Pertinent negatives include no blurred vision or chest pain. Past treatments include angiotensin blockers, diuretics and ACE inhibitors. The current treatment provides moderate improvement. Hypertensive end-organ damage includes kidney disease.     Past Medical History:  Diagnosis Date  . Arthritis   . Cataracts, bilateral   . Cervical disc disease   . Chronic kidney disease    Dr. Goldsbrough yearly for CKD stage II as of 11/26/11  . Diabetes mellitus (HCC)   . Eczema   . Glaucoma   . Heart murmur   . History of chicken pox   . Hyperlipidemia   . Hypertension   . Seasonal allergies   . Thyroid nodule      Family History  Problem Relation Age of Onset  . Hypertension  Mother   . Early death Father   . Breast cancer Sister   . Healthy Brother      Current Outpatient Medications:  .  allopurinol (ZYLOPRIM) 100 MG tablet, TK 2 TS PO D, Disp: , Rfl:  .  Cholecalciferol (VITAMIN D) 1000 UNITS capsule, Take 1,000 Units by mouth 2 (two) times daily., Disp: , Rfl:  .  colchicine 0.6 MG tablet, TAKE 1 TABLET BY MOUTH EVERY DAY, Disp: 30 tablet, Rfl: 4 .  ferrous sulfate 325 (65 FE) MG tablet, Take 1 tablet (325 mg total) by mouth daily with breakfast., Disp: 30 tablet, Rfl: 3 .  Lancets (ONETOUCH DELICA PLUS LANCET30G) MISC, USE 2 TIMES A DAY, Disp: 100 each, Rfl: 0 .  latanoprost (XALATAN) 0.005 % ophthalmic solution, place 1 drop into both eyes at bedtime, Disp: , Rfl:  .  omeprazole (PRILOSEC) 40 MG capsule, TK 1 C PO 20 MIN B BRE, Disp: , Rfl:  .  ONE TOUCH ULTRA TEST test strip, Use as directed to check blood sugars 2 times per day dx: e11.65, Disp: 100 each, Rfl: 11 .  rosuvastatin (CRESTOR) 10 MG tablet, Take 1 tablet (10 mg total) by mouth daily., Disp: 90 tablet, Rfl: 1 .  Semaglutide,0.25 or 0.5MG/DOS, (OZEMPIC, 0.25 OR 0.5 MG/DOSE,) 2 MG/1.5ML SOPN, Inject 0.5 mg into the skin once a week., Disp: 3 pen, Rfl: 1 .  timolol (TIMOPTIC-XR) 0.5 % ophthalmic gel-forming, Place 1 drop into both eyes daily., Disp: , Rfl:  .  vitamin C (  ASCORBIC ACID) 500 MG tablet, Take 1,000 mg by mouth daily., Disp: , Rfl:  .  amLODipine (NORVASC) 10 MG tablet, TAKE 1 TABLET(10 MG) BY MOUTH DAILY, Disp: 90 tablet, Rfl: 0 .  carvedilol (COREG) 6.25 MG tablet, TAKE 1 TABLET(6.25 MG) BY MOUTH TWICE DAILY WITH A MEAL, Disp: 180 tablet, Rfl: 0 .  valsartan-hydrochlorothiazide (DIOVAN-HCT) 80-12.5 MG tablet, Take 1 tablet by mouth daily., Disp: 90 tablet, Rfl: 0   No Known Allergies   Review of Systems  Constitutional: Negative.   HENT: Negative.   Eyes: Negative.  Negative for blurred vision.  Respiratory: Negative.   Cardiovascular: Negative.  Negative for chest pain.   Endocrine: Negative.   Genitourinary: Negative.   Musculoskeletal: Negative.   Skin: Negative.   Allergic/Immunologic: Negative.   Neurological: Negative.   Hematological: Negative.   Psychiatric/Behavioral: Negative.      Today's Vitals   10/11/18 1115  BP: 140/72  Pulse: 72  Temp: 97.8 F (36.6 C)  TempSrc: Oral  SpO2: 97%  Weight: 160 lb 12.8 oz (72.9 kg)  Height: 5' 2.8" (1.595 m)   Body mass index is 28.67 kg/m.   Objective:  Physical Exam Vitals signs and nursing note reviewed.  Constitutional:      Appearance: Normal appearance.  HENT:     Head: Normocephalic and atraumatic.     Right Ear: Tympanic membrane, ear canal and external ear normal.     Left Ear: Tympanic membrane, ear canal and external ear normal.     Nose: Nose normal.     Mouth/Throat:     Mouth: Mucous membranes are moist.     Pharynx: Oropharynx is clear.  Eyes:     Extraocular Movements: Extraocular movements intact.     Conjunctiva/sclera: Conjunctivae normal.     Pupils: Pupils are equal, round, and reactive to light.  Neck:     Musculoskeletal: Normal range of motion and neck supple.  Cardiovascular:     Rate and Rhythm: Normal rate and regular rhythm.     Pulses: Normal pulses.          Dorsalis pedis pulses are 2+ on the right side and 2+ on the left side.     Heart sounds: Normal heart sounds.  Pulmonary:     Effort: Pulmonary effort is normal.     Breath sounds: Normal breath sounds.  Chest:     Breasts: Tanner Score is 5.        Right: Normal.        Left: Normal.  Abdominal:     General: Abdomen is flat. Bowel sounds are normal.     Palpations: Abdomen is soft.  Genitourinary:    Comments: deferred Musculoskeletal: Normal range of motion.  Feet:     Right foot:     Protective Sensation: 5 sites tested. 5 sites sensed.     Skin integrity: Dry skin present.     Toenail Condition: Right toenails are normal.     Left foot:     Protective Sensation: 5 sites tested. 5  sites sensed.     Skin integrity: Dry skin present.     Toenail Condition: Left toenails are normal.  Skin:    General: Skin is warm and dry.  Neurological:     General: No focal deficit present.     Mental Status: She is alert and oriented to person, place, and time.  Psychiatric:        Mood and Affect: Mood normal.  Behavior: Behavior normal.         Assessment And Plan:     1. Encounter for annual physical exam  A full exam was performed.  Importance of monthly self breast exams was discussed with the patient. PATIENT HAS BEEN ADVISED TO GET 30-45 MINUTES REGULAR EXERCISE NO LESS THAN FOUR TO FIVE DAYS PER WEEK - BOTH WEIGHTBEARING EXERCISES AND AEROBIC ARE RECOMMENDED.  SHE WAS ADVISED TO FOLLOW A HEALTHY DIET WITH AT LEAST SIX FRUITS/VEGGIES PER DAY, DECREASE INTAKE OF RED MEAT, AND TO INCREASE FISH INTAKE TO TWO DAYS PER WEEK.  MEATS/FISH SHOULD NOT BE FRIED, BAKED OR BROILED IS PREFERABLE.  I SUGGEST WEARING SPF 50 SUNSCREEN ON EXPOSED PARTS AND ESPECIALLY WHEN IN THE DIRECT SUNLIGHT FOR AN EXTENDED PERIOD OF TIME.  PLEASE AVOID FAST FOOD RESTAURANTS AND INCREASE YOUR WATER INTAKE.  - POCT Urinalysis Dipstick (81002) - POCT UA - Microalbumin - EKG 12-Lead  2. Type 2 diabetes mellitus with stage 3 chronic kidney disease, without long-term current use of insulin, unspecified whether stage 3a or 3b CKD ([place])  Diabetic foot exam was performed.  I DISCUSSED WITH THE PATIENT AT LENGTH REGARDING THE GOALS OF GLYCEMIC CONTROL AND POSSIBLE LONG-TERM COMPLICATIONS.  I  ALSO STRESSED THE IMPORTANCE OF COMPLIANCE WITH HOME GLUCOSE MONITORING, DIETARY RESTRICTIONS INCLUDING AVOIDANCE OF SUGARY DRINKS/PROCESSED FOODS,  ALONG WITH REGULAR EXERCISE.  I  ALSO STRESSED THE IMPORTANCE OF ANNUAL EYE EXAMS, SELF FOOT CARE AND COMPLIANCE WITH OFFICE VISITS.  - POCT Urinalysis Dipstick (81002) - POCT UA - Microalbumin - EKG 12-Lead - CMP14+EGFR - CBC - Lipid panel - Hemoglobin A1c  3.  Hypertensive nephropathy  Chronic, fair control. She will continue with current meds. She is encouraged to aim for 150 minutes of regular exercise per week. EKG performed, no new changes noted.  She will rto in four months for re-evaluation.   - POCT Urinalysis Dipstick (81002) - POCT UA - Microalbumin - EKG 12-Lead  4. Overweight with body mass index (BMI) of 28 to 28.9 in adult  She is encouraged to strive to lose 7-10 pounds to decrease cardiac risk.   [name], MD    THE PATIENT IS ENCOURAGED TO PRACTICE SOCIAL DISTANCING DUE TO THE COVID-19 PANDEMIC.

## 2018-11-07 ENCOUNTER — Other Ambulatory Visit: Payer: Self-pay

## 2018-11-07 ENCOUNTER — Telehealth: Payer: Self-pay | Admitting: Pharmacist

## 2018-11-07 ENCOUNTER — Ambulatory Visit (INDEPENDENT_AMBULATORY_CARE_PROVIDER_SITE_OTHER): Payer: Medicare Other

## 2018-11-07 ENCOUNTER — Other Ambulatory Visit: Payer: Medicare Other

## 2018-11-07 VITALS — BP 140/64 | HR 70 | Temp 97.7°F | Ht 64.2 in | Wt 162.6 lb

## 2018-11-07 DIAGNOSIS — E875 Hyperkalemia: Secondary | ICD-10-CM

## 2018-11-07 DIAGNOSIS — Z23 Encounter for immunization: Secondary | ICD-10-CM | POA: Diagnosis not present

## 2018-11-08 LAB — POTASSIUM: Potassium: 4.7 mmol/L (ref 3.5–5.2)

## 2018-11-09 ENCOUNTER — Telehealth: Payer: Self-pay

## 2018-11-09 NOTE — Telephone Encounter (Signed)
-----   Message from Glendale Chard, MD sent at 11/08/2018  9:44 PM EST ----- Your potassium level has improved.  pls let me know if you have any questions. Happy holidays to you and your family!

## 2018-11-09 NOTE — Telephone Encounter (Signed)
1st attempt to give lab results  

## 2018-11-10 ENCOUNTER — Telehealth: Payer: Self-pay

## 2018-11-10 NOTE — Telephone Encounter (Signed)
Patient notified Your potassium level has improved.  pls let me know if you have any questions. Happy holidays to you and your family!

## 2018-12-08 ENCOUNTER — Other Ambulatory Visit: Payer: Self-pay

## 2018-12-08 MED ORDER — OZEMPIC (0.25 OR 0.5 MG/DOSE) 2 MG/1.5ML ~~LOC~~ SOPN: 0.5000 mg | PEN_INJECTOR | SUBCUTANEOUS | 1 refills | Status: DC

## 2019-01-04 ENCOUNTER — Telehealth: Payer: Self-pay | Admitting: Pharmacist

## 2019-01-09 ENCOUNTER — Ambulatory Visit: Payer: Self-pay | Admitting: Pharmacist

## 2019-01-09 DIAGNOSIS — I10 Essential (primary) hypertension: Secondary | ICD-10-CM

## 2019-01-09 DIAGNOSIS — N183 Chronic kidney disease, stage 3 unspecified: Secondary | ICD-10-CM

## 2019-01-09 DIAGNOSIS — E1122 Type 2 diabetes mellitus with diabetic chronic kidney disease: Secondary | ICD-10-CM

## 2019-01-09 NOTE — Progress Notes (Signed)
  Chronic Care Management   Outreach Note  01/09/2019 Name: Michelle Cummings MRN: [id number] DOB: [birth year]  Referred by: Glendale Chard, MD Reason for referral : Chronic Care Management   An unsuccessful telephone outreach was attempted today. The patient was referred to the case management team by for assistance with care management and care coordination.   Follow Up Plan: A HIPPA compliant phone message was left for the patient providing contact information and requesting a return call.  The care management team will reach out to the patient again over the next 10 days.   SIGNATURE Regina Eck, PharmD, BCPS Clinical Pharmacist, [facility] [place]: [phone]

## 2019-01-11 ENCOUNTER — Other Ambulatory Visit: Payer: Self-pay | Admitting: Internal Medicine

## 2019-01-11 DIAGNOSIS — Z1231 Encounter for screening mammogram for malignant neoplasm of breast: Secondary | ICD-10-CM

## 2019-01-16 ENCOUNTER — Telehealth: Payer: Self-pay

## 2019-01-18 ENCOUNTER — Ambulatory Visit: Payer: Self-pay | Admitting: Pharmacist

## 2019-01-18 DIAGNOSIS — N183 Chronic kidney disease, stage 3 unspecified: Secondary | ICD-10-CM

## 2019-01-18 DIAGNOSIS — E1122 Type 2 diabetes mellitus with diabetic chronic kidney disease: Secondary | ICD-10-CM

## 2019-01-18 NOTE — Progress Notes (Signed)
  Chronic Care Management   Outreach Note  01/18/2019 Name: NAKESHA SAMORA MRN: [id number] DOB: 02/15/1950  Referred by: Glendale Chard, MD Reason for referral : Chronic Care Management  Successful outreach to Ms. Noe.  She denied needing assistance with medications and does not wish to participate in CCM pharmacy services  Follow Up Plan: No further follow up required    Regina Eck, PharmD, [place] Pharmacist, [facility] [place]: [phone]

## 2019-01-20 ENCOUNTER — Other Ambulatory Visit: Payer: Self-pay | Admitting: Internal Medicine

## 2019-01-25 ENCOUNTER — Other Ambulatory Visit: Payer: Self-pay | Admitting: Internal Medicine

## 2019-01-25 ENCOUNTER — Other Ambulatory Visit: Payer: Self-pay

## 2019-01-25 DIAGNOSIS — M10342 Gout due to renal impairment, left hand: Secondary | ICD-10-CM

## 2019-01-25 MED ORDER — COLCHICINE 0.6 MG PO TABS: 0.6000 mg | ORAL_TABLET | Freq: Every day | ORAL | 4 refills | Status: DC

## 2019-01-26 ENCOUNTER — Other Ambulatory Visit: Payer: Self-pay

## 2019-01-26 MED ORDER — COLCHICINE 0.6 MG PO CAPS: 0.6000 mg | ORAL_CAPSULE | Freq: Every day | ORAL | 2 refills | Status: DC

## 2019-01-26 MED ORDER — ACCU-CHEK MULTICLIX LANCETS MISC: 12 refills | Status: DC

## 2019-01-26 MED ORDER — BLOOD GLUCOSE MONITOR KIT: PACK | 0 refills | Status: DC

## 2019-01-26 MED ORDER — GLUCOSE BLOOD VI STRP: ORAL_STRIP | 12 refills | Status: DC

## 2019-02-02 ENCOUNTER — Telehealth: Payer: Self-pay

## 2019-02-02 NOTE — Telephone Encounter (Signed)
Spoke with pt about glucometer supplies

## 2019-02-06 ENCOUNTER — Other Ambulatory Visit: Payer: Self-pay

## 2019-02-06 MED ORDER — ONETOUCH ULTRA 2 W/DEVICE KIT: PACK | 0 refills | Status: DC

## 2019-02-06 MED ORDER — ONETOUCH ULTRASOFT LANCETS MISC: 4 refills | Status: DC

## 2019-02-06 MED ORDER — ONETOUCH ULTRA VI STRP: ORAL_STRIP | 4 refills | Status: DC

## 2019-02-07 ENCOUNTER — Other Ambulatory Visit: Payer: Self-pay

## 2019-02-07 MED ORDER — MITIGARE 0.6 MG PO CAPS: ORAL_CAPSULE | ORAL | 1 refills | Status: DC

## 2019-02-14 ENCOUNTER — Encounter: Payer: Self-pay | Admitting: Internal Medicine

## 2019-02-14 ENCOUNTER — Ambulatory Visit: Payer: Medicare PPO | Admitting: Internal Medicine

## 2019-02-14 ENCOUNTER — Other Ambulatory Visit: Payer: Self-pay

## 2019-02-14 VITALS — BP 124/76 | HR 69 | Temp 97.5°F | Ht 64.2 in | Wt 158.4 lb

## 2019-02-14 DIAGNOSIS — N183 Chronic kidney disease, stage 3 unspecified: Secondary | ICD-10-CM

## 2019-02-14 DIAGNOSIS — I129 Hypertensive chronic kidney disease with stage 1 through stage 4 chronic kidney disease, or unspecified chronic kidney disease: Secondary | ICD-10-CM

## 2019-02-14 DIAGNOSIS — E1122 Type 2 diabetes mellitus with diabetic chronic kidney disease: Secondary | ICD-10-CM | POA: Diagnosis not present

## 2019-02-14 DIAGNOSIS — E663 Overweight: Secondary | ICD-10-CM | POA: Diagnosis not present

## 2019-02-14 DIAGNOSIS — Z6827 Body mass index (BMI) 27.0-27.9, adult: Secondary | ICD-10-CM | POA: Diagnosis not present

## 2019-02-14 DIAGNOSIS — I700 Atherosclerosis of aorta: Secondary | ICD-10-CM | POA: Diagnosis not present

## 2019-02-14 MED ORDER — AMLODIPINE BESYLATE 10 MG PO TABS: ORAL_TABLET | ORAL | 2 refills | Status: DC

## 2019-02-14 MED ORDER — VALSARTAN-HYDROCHLOROTHIAZIDE 80-12.5 MG PO TABS: 1.0000 | ORAL_TABLET | Freq: Every day | ORAL | 2 refills | Status: DC

## 2019-02-14 MED ORDER — CARVEDILOL 6.25 MG PO TABS: ORAL_TABLET | ORAL | 2 refills | Status: DC

## 2019-02-14 MED ORDER — ROSUVASTATIN CALCIUM 10 MG PO TABS: 10.0000 mg | ORAL_TABLET | Freq: Every day | ORAL | 2 refills | Status: DC

## 2019-02-14 NOTE — Progress Notes (Signed)
This visit occurred during the SARS-CoV-2 public health emergency.  Safety protocols were in place, including screening questions prior to the visit, additional usage of staff PPE, and extensive cleaning of exam room while observing appropriate contact time as indicated for disinfecting solutions.  Subjective:     Patient ID: Michelle Cummings , female    DOB: March 29, 1950 , 69 y.o.   MRN: [id number]   Chief Complaint  Patient presents with  . Diabetes  . Hypertension    HPI  Diabetes She presents for her follow-up diabetic visit. She has type 2 diabetes mellitus. Her disease course has been stable. There are no hypoglycemic associated symptoms. Pertinent negatives for diabetes include no blurred vision and no chest pain. There are no hypoglycemic complications. Diabetic complications include nephropathy. Risk factors for coronary artery disease include diabetes mellitus, dyslipidemia, hypertension and post-menopausal. She is following a diabetic diet. She participates in exercise three times a week. Her breakfast blood glucose is taken between 9-10 am. Her breakfast blood glucose range is generally 110-130 mg/dl. An ACE inhibitor/angiotensin II receptor blocker is being taken.  Hypertension This is a chronic problem. The current episode started more than 1 year ago. The problem has been gradually improving since onset. The problem is controlled. Pertinent negatives include no blurred vision or chest pain. Past treatments include angiotensin blockers, diuretics and ACE inhibitors. The current treatment provides moderate improvement. Hypertensive end-organ damage includes kidney disease.     Past Medical History:  Diagnosis Date  . Arthritis   . Cataracts, bilateral   . Cervical disc disease   . Chronic kidney disease    Dr. Hillery Hunter yearly for CKD stage II as of 11/26/11  . Diabetes mellitus ([place])   . Eczema   . Glaucoma   . Heart murmur   . History of chicken pox   . Hyperlipidemia   .  Hypertension   . Seasonal allergies   . Thyroid nodule      Family History  Problem Relation Age of Onset  . Hypertension Mother   . Early death Father   . Breast cancer Sister   . Healthy Brother      Current Outpatient Medications:  .  allopurinol (ZYLOPRIM) 100 MG tablet, TK 2 TS PO D, Disp: , Rfl:  .  amLODipine (NORVASC) 10 MG tablet, TAKE 1 TABLET(10 MG) BY MOUTH DAILY, Disp: 90 tablet, Rfl: 0 .  blood glucose meter kit and supplies KIT, Dispense based on patient and insurance preference. Use up to four times daily to check blood sugars Dx code e11.65, Disp: 1 each, Rfl: 0 .  Blood Glucose Monitoring Suppl (ONE TOUCH ULTRA 2) w/Device KIT, Please fill one touch ultra blue glucometer; Please use to test blood sugars twice daily. DX: 11.65, Disp: 1 kit, Rfl: 0 .  carvedilol (COREG) 6.25 MG tablet, TAKE 1 TABLET(6.25 MG) BY MOUTH TWICE DAILY WITH A MEAL, Disp: 180 tablet, Rfl: 0 .  Cholecalciferol (VITAMIN D) 1000 UNITS capsule, Take 1,000 Units by mouth 2 (two) times daily., Disp: , Rfl:  .  ferrous sulfate 325 (65 FE) MG tablet, Take 1 tablet (325 mg total) by mouth daily with breakfast., Disp: 30 tablet, Rfl: 3 .  glucose blood (ONETOUCH ULTRA) test strip, Use as instructed to test blood sugar twice daily . Please fill one touch ultra BLUE DX: E11.65, Disp: 200 each, Rfl: 4 .  Lancets (ONETOUCH ULTRASOFT) lancets, Use as instructed; please fill one touch ultra blue, Disp: 200 each, Rfl: 4 .  latanoprost (XALATAN) 0.005 % ophthalmic solution, place 1 drop into both eyes at bedtime, Disp: , Rfl:  .  MITIGARE 0.6 MG CAPS, Take 1 capsule by mouth daily, Disp: 30 capsule, Rfl: 1 .  omeprazole (PRILOSEC) 40 MG capsule, TK 1 C PO 20 MIN B BRE, Disp: , Rfl:  .  rosuvastatin (CRESTOR) 10 MG tablet, Take 1 tablet (10 mg total) by mouth daily., Disp: 90 tablet, Rfl: 1 .  Semaglutide,0.25 or 0.'5MG'$ /DOS, (OZEMPIC, 0.25 OR 0.5 MG/DOSE,) 2 MG/1.5ML SOPN, Inject 0.5 mg into the skin once a week.,  Disp: 3 pen, Rfl: 1 .  timolol (TIMOPTIC-XR) 0.5 % ophthalmic gel-forming, Place 1 drop into both eyes daily., Disp: , Rfl:  .  valsartan-hydrochlorothiazide (DIOVAN-HCT) 80-12.5 MG tablet, Take 1 tablet by mouth daily., Disp: 90 tablet, Rfl: 0 .  vitamin C (ASCORBIC ACID) 500 MG tablet, Take 1,000 mg by mouth daily., Disp: , Rfl:    No Known Allergies   Review of Systems  Constitutional: Negative.   Eyes: Negative for blurred vision.  Respiratory: Negative.   Cardiovascular: Negative.  Negative for chest pain.  Gastrointestinal: Negative.   Neurological: Negative.   Psychiatric/Behavioral: Negative.      Today's Vitals   02/14/19 1056  BP: 124/76  Pulse: 69  Temp: (!) 97.5 F (36.4 C)  TempSrc: Oral  Weight: 158 lb 6.4 oz (71.8 kg)  Height: 5' 4.2" (1.631 m)  PainSc: 0-No pain   Body mass index is 27.02 kg/m.   Objective:  Physical Exam Vitals and nursing note reviewed.  Constitutional:      Appearance: Normal appearance.  HENT:     Head: Normocephalic and atraumatic.  Cardiovascular:     Rate and Rhythm: Normal rate and regular rhythm.     Heart sounds: Normal heart sounds.  Pulmonary:     Effort: Pulmonary effort is normal.     Breath sounds: Normal breath sounds.  Skin:    General: Skin is warm.  Neurological:     General: No focal deficit present.     Mental Status: She is alert.  Psychiatric:        Mood and Affect: Mood normal.        Behavior: Behavior normal.         Assessment And Plan:     1. Type 2 diabetes mellitus with stage 3 chronic kidney disease, without long-term current use of insulin, unspecified whether stage 3a or 3b CKD (HCC)  Chronic, yet stable. I will check labs as listed below. She is encouraged to continue with her regular exercise regimen. I will adjust meds as needed once her labs are available for review.   - BMP8+EGFR - Hemoglobin A1c  2. Hypertensive nephropathy  Chronic, well controlled. She will continue with  current medications. She is encouraged to avoid adding salt to her foods.   3. Stage 3 chronic kidney disease, unspecified whether stage 3a or 3b CKD  Chronic, yet stable. She is encouraged to stay well hydrated. She verbalizes understanding of importance of optimal BP/BS control to prevent progression of CKD.   4. Overweight with body mass index (BMI) of 27 to 27.9 in adult  Her weight is stable for her demographic. She is still encouraged to exercise 30 minutes five days per week.   5. Aortic atherosclerosis (HCC)  Chronic, yet stable. Importance of regular exercise and statin compliance was discussed with the patient.     [name], MD    THE PATIENT IS ENCOURAGED  TO PRACTICE SOCIAL DISTANCING DUE TO THE COVID-19 PANDEMIC.

## 2019-02-14 NOTE — Patient Instructions (Addendum)
Zicam - zinc supplementation  Change rosuvastatin to M-F dosing  Continue to exercise five days per week   COVID-19 Frequently Asked Questions COVID-19 (coronavirus disease) is an infection that is caused by a large family of viruses. Some viruses cause illness in people and others cause illness in animals like camels, cats, and bats. In some cases, the viruses that cause illness in animals can spread to humans. Where did the coronavirus come from? In December 2019, [place] told the [facility]) of several cases of lung disease (human respiratory illness). These cases were linked to an open seafood and livestock market in the city of [place]. The link to the seafood and livestock market suggests that the virus may have spread from animals to humans. However, since that first outbreak in December, the virus has also been shown to spread from person to person. What is the name of the disease and the virus? Disease name Early on, this disease was called novel coronavirus. This is because scientists determined that the disease was caused by a new (novel) respiratory virus. The World Health Organization [facility]) has now named the disease COVID-19, or coronavirus disease. Virus name The virus that causes the disease is called severe acute respiratory syndrome coronavirus 2 (SARS-CoV-2). More information on disease and virus naming World Health Organization [facility]): www.who.int/emergencies/diseases/novel-coronavirus-2019/technical-guidance/naming-the-coronavirus-disease-(covid-2019)-and-the-virus-that-causes-it Who is at risk for complications from coronavirus disease? Some people may be at higher risk for complications from coronavirus disease. This includes older adults and people who have chronic diseases, such as heart disease, diabetes, and lung disease. If you are at higher risk for complications, take these extra precautions:  Stay home as much as possible.  Avoid social gatherings  and travel.  Avoid close contact with others. Stay at least 6 ft (2 m) away from others, if possible.  Wash your hands often with soap and water for at least 20 seconds.  Avoid touching your face, mouth, nose, or eyes.  Keep supplies on hand at home, such as food, medicine, and cleaning supplies.  If you must go out in public, wear a cloth face covering or face mask. Make sure your mask covers your nose and mouth. How does coronavirus disease spread? The virus that causes coronavirus disease spreads easily from person to person (is contagious). You may catch the virus by:  Breathing in droplets from an infected person. Droplets can be spread by a person breathing, speaking, singing, coughing, or sneezing.  Touching something, like a table or a doorknob, that was exposed to the virus (contaminated) and then touching your mouth, nose, or eyes. Can I get the virus from touching surfaces or objects? There is still a lot that we do not know about the virus that causes coronavirus disease. Scientists are basing a lot of information on what they know about similar viruses, such as:  Viruses cannot generally survive on surfaces for long. They need a human body (host) to survive.  It is more likely that the virus is spread by close contact with people who are sick (direct contact), such as through: ? Shaking hands or hugging. ? Breathing in respiratory droplets that travel through the air. Droplets can be spread by a person breathing, speaking, singing, coughing, or sneezing.  It is less likely that the virus is spread when a person touches a surface or object that has the virus on it (indirect contact). The virus may be able to enter the body if the person touches a surface or object and then touches  his or her face, eyes, nose, or mouth. Can a person spread the virus without having symptoms of the disease? It may be possible for the virus to spread before a person has symptoms of the disease,  but this is most likely not the main way the virus is spreading. It is more likely for the virus to spread by being in close contact with people who are sick and breathing in the respiratory droplets spread by a person breathing, speaking, singing, coughing, or sneezing. What are the symptoms of coronavirus disease? Symptoms vary from person to person and can range from mild to severe. Symptoms may include:  Fever or chills.  Cough.  Difficulty breathing or feeling short of breath.  Headaches, body aches, or muscle aches.  Runny or stuffy (congested) nose.  Sore throat.  New loss of taste or smell.  Nausea, vomiting, or diarrhea. These symptoms can appear anywhere from 2 to 14 days after you have been exposed to the virus. Some people may not have any symptoms. If you develop symptoms, call your health care provider. People with severe symptoms may need hospital care. Should I be tested for this virus? Your health care provider will decide whether to test you based on your symptoms, history of exposure, and your risk factors. How does a health care provider test for this virus? Health care providers will collect samples to send for testing. Samples may include:  Taking a swab of fluid from the back of your nose and throat, your nose, or your throat.  Taking fluid from the lungs by having you cough up mucus (sputum) into a sterile cup.  Taking a blood sample. Is there a treatment or vaccine for this virus? Currently, there is no vaccine to prevent coronavirus disease. Also, there are no medicines like antibiotics or antivirals to treat the virus. A person who becomes sick is given supportive care, which means rest and fluids. A person may also relieve his or her symptoms by using over-the-counter medicines that treat sneezing, coughing, and runny nose. These are the same medicines that a person takes for the common cold. If you develop symptoms, call your health care provider. People  with severe symptoms may need hospital care. What can I do to protect myself and my family from this virus?     You can protect yourself and your family by taking the same actions that you would take to prevent the spread of other viruses. Take the following actions:  Wash your hands often with soap and water for at least 20 seconds. If soap and water are not available, use alcohol-based hand sanitizer.  Avoid touching your face, mouth, nose, or eyes.  Cough or sneeze into a tissue, sleeve, or elbow. Do not cough or sneeze into your hand or the air. ? If you cough or sneeze into a tissue, throw it away immediately and wash your hands.  Disinfect objects and surfaces that you frequently touch every day.  Stay away from people who are sick.  Avoid going out in public, follow guidance from your state and local health authorities.  Avoid crowded indoor spaces. Stay at least 6 ft (2 m) away from others.  If you must go out in public, wear a cloth face covering or face mask. Make sure your mask covers your nose and mouth.  Stay home if you are sick, except to get medical care. Call your health care provider before you get medical care. Your health care provider will tell you  how long to stay home.  Make sure your vaccines are up to date. Ask your health care provider what vaccines you need. What should I do if I need to travel? Follow travel recommendations from your local health authority, the CDC, and WHO. Travel information and advice  Centers for Disease Control and Prevention (CDC): BodyEditor.hu  World Health Organization [facility]): ThirdIncome.ca Know the risks and take action to protect your health  You are at higher risk of getting coronavirus disease if you are traveling to areas with an outbreak or if you are exposed to travelers from areas with an outbreak.  Wash your hands often  and practice good hygiene to lower the risk of catching or spreading the virus. What should I do if I am sick? General instructions to stop the spread of infection  Wash your hands often with soap and water for at least 20 seconds. If soap and water are not available, use alcohol-based hand sanitizer.  Cough or sneeze into a tissue, sleeve, or elbow. Do not cough or sneeze into your hand or the air.  If you cough or sneeze into a tissue, throw it away immediately and wash your hands.  Stay home unless you must get medical care. Call your health care provider or local health authority before you get medical care.  Avoid public areas. Do not take public transportation, if possible.  If you can, wear a mask if you must go out of the house or if you are in close contact with someone who is not sick. Make sure your mask covers your nose and mouth. Keep your home clean  Disinfect objects and surfaces that are frequently touched every day. This may include: ? Counters and tables. ? Doorknobs and light switches. ? Sinks and faucets. ? Electronics such as phones, remote controls, keyboards, computers, and tablets.  Wash dishes in hot, soapy water or use a dishwasher. Air-dry your dishes.  Wash laundry in hot water. Prevent infecting other household members  Let healthy household members care for children and pets, if possible. If you have to care for children or pets, wash your hands often and wear a mask.  Sleep in a different bedroom or bed, if possible.  Do not share personal items, such as razors, toothbrushes, deodorant, combs, brushes, towels, and washcloths. Where to find more information Centers for Disease Control and Prevention (CDC)  Information and news updates: https://www.butler-gonzalez.com/ World Health Organization [facility])  Information and news updates: MissExecutive.com.ee  Coronavirus health topic:  https://www.castaneda.info/  Questions and answers on COVID-19: OpportunityDebt.at  Global tracker: who.sprinklr.com American Academy of Pediatrics (AAP)  Information for families: www.healthychildren.org/English/health-issues/conditions/chest-lungs/Pages/2019-Novel-Coronavirus.aspx The coronavirus situation is changing rapidly. Check your local health authority website or the CDC and [facility] websites for updates and news. When should I contact a health care provider?  Contact your health care provider if you have symptoms of an infection, such as fever or cough, and you: ? Have been near anyone who is known to have coronavirus disease. ? Have come into contact with a person who is suspected to have coronavirus disease. ? Have traveled to an area where there is an outbreak of COVID-19. When should I get emergency medical care?  Get help right away by calling your local emergency services (911 in the [place] if you have: ? Trouble breathing. ? Pain or pressure in your chest. ? Confusion. ? Blue-tinged lips and fingernails. ? Difficulty waking from sleep. ? Symptoms that get worse. Let the emergency medical personnel know if  you think you have coronavirus disease. Summary  A new respiratory virus is spreading from person to person and causing COVID-19 (coronavirus disease).  The virus that causes COVID-19 appears to spread easily. It spreads from one person to another through droplets from breathing, speaking, singing, coughing, or sneezing.  Older adults and those with chronic diseases are at higher risk of disease. If you are at higher risk for complications, take extra precautions.  There is currently no vaccine to prevent coronavirus disease. There are no medicines, such as antibiotics or antivirals, to treat the virus.  You can protect yourself and your family by washing your hands often, avoiding touching your face, and covering your coughs  and sneezes. This information is not intended to replace advice given to you by your health care provider. Make sure you discuss any questions you have with your health care provider. Document Revised: 10/21/2018 Document Reviewed: 04/19/2018 Elsevier Patient Education  [place].

## 2019-02-15 LAB — BMP8+EGFR
BUN/Creatinine Ratio: 27 (ref 12–28)
BUN: 54 mg/dL — ABNORMAL HIGH (ref 8–27)
CO2: 20 mmol/L (ref 20–29)
Calcium: 9.9 mg/dL (ref 8.7–10.3)
Chloride: 98 mmol/L (ref 96–106)
Creatinine, Ser: 1.99 mg/dL — ABNORMAL HIGH (ref 0.57–1.00)
GFR calc Af Amer: 29 mL/min/{1.73_m2} — ABNORMAL LOW (ref >59)
GFR calc non Af Amer: 25 mL/min/{1.73_m2} — ABNORMAL LOW (ref >59)
Glucose: 101 mg/dL — ABNORMAL HIGH (ref 65–99)
Potassium: 5 mmol/L (ref 3.5–5.2)
Sodium: 131 mmol/L — ABNORMAL LOW (ref 134–144)

## 2019-02-15 LAB — HEMOGLOBIN A1C
Est. average glucose Bld gHb Est-mCnc: 143 mg/dL
Hgb A1c MFr Bld: 6.6 % — ABNORMAL HIGH (ref 4.8–5.6)

## 2019-02-16 ENCOUNTER — Other Ambulatory Visit: Payer: Self-pay

## 2019-02-16 ENCOUNTER — Telehealth: Payer: Self-pay

## 2019-02-16 NOTE — Telephone Encounter (Signed)
I left a detailed message with patient's consent.

## 2019-02-16 NOTE — Telephone Encounter (Signed)
-----   Message from Glendale Chard, MD sent at 02/15/2019  7:25 PM EST ----- Here are your lab results:  Your kidney function is stable. You are in stage 4 chronic kidney disease. Please stay well hydrated. Your hba1c is great at 6.6.   You are doing great! Keep up the great work!  Do you have reflux symptoms daily? If not, can we change omeprazole to Monday, Wednesday, Friday dosing only?   Please let me know if you have any questions or concerns. Stay safe!   Sincerely,    Robyn N. Baird Cancer, MD

## 2019-02-21 ENCOUNTER — Other Ambulatory Visit: Payer: Self-pay

## 2019-02-21 ENCOUNTER — Ambulatory Visit
Admission: RE | Admit: 2019-02-21 | Discharge: 2019-02-21 | Disposition: A | Discharge status: Home / Self Care | Payer: Medicare PPO | Source: Ambulatory Visit | Attending: Internal Medicine | Admitting: Internal Medicine

## 2019-02-21 DIAGNOSIS — Z1231 Encounter for screening mammogram for malignant neoplasm of breast: Secondary | ICD-10-CM

## 2019-02-24 ENCOUNTER — Other Ambulatory Visit: Payer: Self-pay | Admitting: Internal Medicine

## 2019-03-20 DIAGNOSIS — M109 Gout, unspecified: Secondary | ICD-10-CM | POA: Diagnosis not present

## 2019-03-20 DIAGNOSIS — I129 Hypertensive chronic kidney disease with stage 1 through stage 4 chronic kidney disease, or unspecified chronic kidney disease: Secondary | ICD-10-CM | POA: Diagnosis not present

## 2019-03-20 DIAGNOSIS — D631 Anemia in chronic kidney disease: Secondary | ICD-10-CM | POA: Diagnosis not present

## 2019-03-20 DIAGNOSIS — E1122 Type 2 diabetes mellitus with diabetic chronic kidney disease: Secondary | ICD-10-CM | POA: Diagnosis not present

## 2019-03-20 DIAGNOSIS — E875 Hyperkalemia: Secondary | ICD-10-CM | POA: Diagnosis not present

## 2019-03-20 DIAGNOSIS — N183 Chronic kidney disease, stage 3 unspecified: Secondary | ICD-10-CM | POA: Diagnosis not present

## 2019-03-21 DIAGNOSIS — H401131 Primary open-angle glaucoma, bilateral, mild stage: Secondary | ICD-10-CM | POA: Diagnosis not present

## 2019-05-11 ENCOUNTER — Other Ambulatory Visit: Payer: Self-pay | Admitting: Internal Medicine

## 2019-05-30 ENCOUNTER — Ambulatory Visit: Payer: Medicare Other

## 2019-05-30 ENCOUNTER — Ambulatory Visit: Payer: Medicare Other | Admitting: Internal Medicine

## 2019-05-31 ENCOUNTER — Ambulatory Visit: Payer: Medicare PPO | Admitting: Internal Medicine

## 2019-05-31 ENCOUNTER — Encounter: Payer: Self-pay | Admitting: Internal Medicine

## 2019-05-31 ENCOUNTER — Other Ambulatory Visit: Payer: Self-pay

## 2019-05-31 VITALS — BP 156/70 | HR 83 | Temp 98.0°F | Ht 63.2 in | Wt 162.4 lb

## 2019-05-31 DIAGNOSIS — N183 Chronic kidney disease, stage 3 unspecified: Secondary | ICD-10-CM

## 2019-05-31 DIAGNOSIS — E663 Overweight: Secondary | ICD-10-CM | POA: Diagnosis not present

## 2019-05-31 DIAGNOSIS — I129 Hypertensive chronic kidney disease with stage 1 through stage 4 chronic kidney disease, or unspecified chronic kidney disease: Secondary | ICD-10-CM

## 2019-05-31 DIAGNOSIS — E1122 Type 2 diabetes mellitus with diabetic chronic kidney disease: Secondary | ICD-10-CM

## 2019-05-31 DIAGNOSIS — Z6828 Body mass index (BMI) 28.0-28.9, adult: Secondary | ICD-10-CM

## 2019-05-31 NOTE — Patient Instructions (Signed)

## 2019-05-31 NOTE — Progress Notes (Signed)
This encounter was created in error - please disregard.

## 2019-05-31 NOTE — Progress Notes (Signed)
This visit occurred during the SARS-CoV-2 public health emergency.  Safety protocols were in place, including screening questions prior to the visit, additional usage of staff PPE, and extensive cleaning of exam room while observing appropriate contact time as indicated for disinfecting solutions.  Subjective:     Patient ID: Michelle Cummings , female    DOB: [birth year] , 69 y.o.   MRN: [id number]   Chief Complaint  Patient presents with  . Hypertension  . Diabetes    HPI  She reports for BP/DM check. She reports compliance with meds.  She is not sure why BP is elevated today. States home BP readings are in 120s-130s/80s.   Hypertension This is a chronic problem. The current episode started more than 1 year ago. The problem has been gradually improving since onset. The problem is controlled. Pertinent negatives include no blurred vision or chest pain. Past treatments include angiotensin blockers, diuretics and ACE inhibitors. The current treatment provides moderate improvement. Hypertensive end-organ damage includes kidney disease.  Diabetes She presents for her follow-up diabetic visit. She has type 2 diabetes mellitus. Her disease course has been stable. There are no hypoglycemic associated symptoms. Pertinent negatives for diabetes include no blurred vision and no chest pain. There are no hypoglycemic complications. Diabetic complications include nephropathy. Risk factors for coronary artery disease include diabetes mellitus, dyslipidemia, hypertension and post-menopausal. She is following a diabetic diet. She participates in exercise three times a week. Her breakfast blood glucose is taken between 9-10 am. Her breakfast blood glucose range is generally 110-130 mg/dl. An ACE inhibitor/angiotensin II receptor blocker is being taken.     Past Medical History:  Diagnosis Date  . Arthritis   . Cataracts, bilateral   . Cervical disc disease   . Chronic kidney disease    Dr. Hillery Hunter yearly  for CKD stage II as of 11/26/11  . Diabetes mellitus ([place])   . Eczema   . Glaucoma   . Heart murmur   . History of chicken pox   . Hyperlipidemia   . Hypertension   . Seasonal allergies   . Thyroid nodule      Family History  Problem Relation Age of Onset  . Hypertension Mother   . Early death Father   . Breast cancer Sister   . Healthy Brother      Current Outpatient Medications:  .  allopurinol (ZYLOPRIM) 100 MG tablet, TK 2 TS PO D, Disp: , Rfl:  .  amLODipine (NORVASC) 10 MG tablet, TAKE 1 TABLET(10 MG) BY MOUTH DAILY, Disp: 90 tablet, Rfl: 1 .  blood glucose meter kit and supplies KIT, Dispense based on patient and insurance preference. Use up to four times daily to check blood sugars Dx code e11.65, Disp: 1 each, Rfl: 0 .  carvedilol (COREG) 6.25 MG tablet, One tab po bid, Disp: 180 tablet, Rfl: 2 .  Cholecalciferol (VITAMIN D) 1000 UNITS capsule, Take 1,000 Units by mouth 2 (two) times daily., Disp: , Rfl:  .  ferrous sulfate 325 (65 FE) MG tablet, Take 1 tablet (325 mg total) by mouth daily with breakfast., Disp: 30 tablet, Rfl: 3 .  latanoprost (XALATAN) 0.005 % ophthalmic solution, place 1 drop into both eyes at bedtime, Disp: , Rfl:  .  MITIGARE 0.6 MG CAPS, Take 1 capsule by mouth daily, Disp: 30 capsule, Rfl: 1 .  rosuvastatin (CRESTOR) 10 MG tablet, Take 1 tablet (10 mg total) by mouth daily., Disp: 90 tablet, Rfl: 2 .  Semaglutide,0.25 or 0.5MG/DOS, ([place],  0.25 OR 0.5 MG/DOSE,) 2 MG/1.5ML SOPN, Inject 0.5 mg into the skin once a week., Disp: 3 pen, Rfl: 1 .  timolol (TIMOPTIC-XR) 0.5 % ophthalmic gel-forming, Place 1 drop into both eyes daily., Disp: , Rfl:  .  valsartan-hydrochlorothiazide (DIOVAN-HCT) 80-12.5 MG tablet, Take 1 tablet by mouth daily., Disp: 90 tablet, Rfl: 2 .  vitamin C (ASCORBIC ACID) 500 MG tablet, Take 1,000 mg by mouth daily., Disp: , Rfl:  .  omeprazole (PRILOSEC) 40 MG capsule, 40 mg. Monday, Wednesday, Friday, Disp: , Rfl:    No Known  Allergies   Review of Systems  Constitutional: Negative.   Eyes: Negative for blurred vision.  Respiratory: Negative.   Cardiovascular: Negative.  Negative for chest pain.  Gastrointestinal: Negative.   Neurological: Negative.   Psychiatric/Behavioral: Negative.      Today's Vitals   05/31/19 1429  BP: (!) 156/70  Pulse: 83  Temp: 98 F (36.7 C)  TempSrc: Oral  Weight: 162 lb 6.4 oz (73.7 kg)  Height: 5' 3.2" (1.605 m)  PainSc: 0-No pain   Body mass index is 28.59 kg/m.   Wt Readings from Last 3 Encounters:  05/31/19 162 lb 6.4 oz (73.7 kg)  02/14/19 158 lb 6.4 oz (71.8 kg)  11/07/18 162 lb 9.6 oz (73.8 kg)   BP Readings from Last 3 Encounters:  05/31/19 (!) 156/70  02/14/19 124/76  11/07/18 140/64      Objective:  Physical Exam Vitals and nursing note reviewed.  Constitutional:      Appearance: Normal appearance.  HENT:     Head: Normocephalic and atraumatic.  Cardiovascular:     Rate and Rhythm: Normal rate and regular rhythm.     Heart sounds: Normal heart sounds.  Pulmonary:     Effort: Pulmonary effort is normal.     Breath sounds: Normal breath sounds.  Skin:    General: Skin is warm.  Neurological:     General: No focal deficit present.     Mental Status: She is alert.  Psychiatric:        Mood and Affect: Mood normal.        Behavior: Behavior normal.         Assessment And Plan:     1. Hypertensive nephropathy  Chronic, uncontrolled. Last BP reading was well controlled. She will continue with current meds for now. Encouraged to avoid adding salt to her foods. She will continue to monitor her home readings. If needed, I plan to increase valsartan to 160/12.42m daily.   2. Type 2 diabetes mellitus with stage 3 chronic kidney disease, without long-term current use of insulin, unspecified whether stage 3a or 3b CKD (HCC)  Chronic. I will check labs as listed below. Importance of regular exercise was discussed with the patient.   -  CMP14+EGFR - Hemoglobin A1c  3. Overweight with body mass index (BMI) of 28 to 28.9 in adult  Her weight is stable for her demographic. She is encouraged to strive for at least 150 minutes of moderate exercise per week .   RMaximino Greenland MD    THE PATIENT IS ENCOURAGED TO PRACTICE SOCIAL DISTANCING DUE TO THE COVID-19 PANDEMIC.

## 2019-06-01 LAB — CMP14+EGFR
ALT: 15 IU/L (ref 0–32)
AST: 17 IU/L (ref 0–40)
Albumin/Globulin Ratio: 1.7 (ref 1.2–2.2)
Albumin: 4.5 g/dL (ref 3.8–4.8)
Alkaline Phosphatase: 127 IU/L — ABNORMAL HIGH (ref 48–121)
BUN/Creatinine Ratio: 22 (ref 12–28)
BUN: 52 mg/dL — ABNORMAL HIGH (ref 8–27)
Bilirubin Total: <0.2 mg/dL (ref 0.0–1.2)
CO2: 19 mmol/L — ABNORMAL LOW (ref 20–29)
Calcium: 9.7 mg/dL (ref 8.7–10.3)
Chloride: 103 mmol/L (ref 96–106)
Creatinine, Ser: 2.36 mg/dL — ABNORMAL HIGH (ref 0.57–1.00)
GFR calc Af Amer: 23 mL/min/{1.73_m2} — ABNORMAL LOW (ref >59)
GFR calc non Af Amer: 20 mL/min/{1.73_m2} — ABNORMAL LOW (ref >59)
Globulin, Total: 2.6 g/dL (ref 1.5–4.5)
Glucose: 102 mg/dL — ABNORMAL HIGH (ref 65–99)
Potassium: 4.3 mmol/L (ref 3.5–5.2)
Sodium: 137 mmol/L (ref 134–144)
Total Protein: 7.1 g/dL (ref 6.0–8.5)

## 2019-06-01 LAB — HEMOGLOBIN A1C
Est. average glucose Bld gHb Est-mCnc: 134 mg/dL
Hgb A1c MFr Bld: 6.3 % — ABNORMAL HIGH (ref 4.8–5.6)

## 2019-06-02 ENCOUNTER — Other Ambulatory Visit: Payer: Self-pay | Admitting: Internal Medicine

## 2019-09-21 DIAGNOSIS — N189 Chronic kidney disease, unspecified: Secondary | ICD-10-CM | POA: Diagnosis not present

## 2019-09-21 DIAGNOSIS — I129 Hypertensive chronic kidney disease with stage 1 through stage 4 chronic kidney disease, or unspecified chronic kidney disease: Secondary | ICD-10-CM | POA: Diagnosis not present

## 2019-09-21 DIAGNOSIS — E1122 Type 2 diabetes mellitus with diabetic chronic kidney disease: Secondary | ICD-10-CM | POA: Diagnosis not present

## 2019-09-21 DIAGNOSIS — N183 Chronic kidney disease, stage 3 unspecified: Secondary | ICD-10-CM | POA: Diagnosis not present

## 2019-09-21 DIAGNOSIS — E875 Hyperkalemia: Secondary | ICD-10-CM | POA: Diagnosis not present

## 2019-09-21 DIAGNOSIS — M109 Gout, unspecified: Secondary | ICD-10-CM | POA: Diagnosis not present

## 2019-09-27 DIAGNOSIS — H524 Presbyopia: Secondary | ICD-10-CM | POA: Diagnosis not present

## 2019-09-27 DIAGNOSIS — H2513 Age-related nuclear cataract, bilateral: Secondary | ICD-10-CM | POA: Diagnosis not present

## 2019-09-27 DIAGNOSIS — H401131 Primary open-angle glaucoma, bilateral, mild stage: Secondary | ICD-10-CM | POA: Diagnosis not present

## 2019-09-27 DIAGNOSIS — E113291 Type 2 diabetes mellitus with mild nonproliferative diabetic retinopathy without macular edema, right eye: Secondary | ICD-10-CM | POA: Diagnosis not present

## 2019-09-27 LAB — HM DIABETES EYE EXAM

## 2019-10-16 ENCOUNTER — Encounter: Payer: Medicare Other | Admitting: Internal Medicine

## 2019-10-19 ENCOUNTER — Other Ambulatory Visit: Payer: Self-pay

## 2019-10-19 ENCOUNTER — Ambulatory Visit (INDEPENDENT_AMBULATORY_CARE_PROVIDER_SITE_OTHER): Payer: Medicare PPO | Admitting: Internal Medicine

## 2019-10-19 ENCOUNTER — Encounter: Payer: Self-pay | Admitting: Internal Medicine

## 2019-10-19 VITALS — BP 136/72 | HR 98 | Temp 97.9°F | Ht 63.2 in | Wt 161.8 lb

## 2019-10-19 DIAGNOSIS — E559 Vitamin D deficiency, unspecified: Secondary | ICD-10-CM | POA: Diagnosis not present

## 2019-10-19 DIAGNOSIS — L84 Corns and callosities: Secondary | ICD-10-CM

## 2019-10-19 DIAGNOSIS — I129 Hypertensive chronic kidney disease with stage 1 through stage 4 chronic kidney disease, or unspecified chronic kidney disease: Secondary | ICD-10-CM | POA: Diagnosis not present

## 2019-10-19 DIAGNOSIS — N183 Chronic kidney disease, stage 3 unspecified: Secondary | ICD-10-CM | POA: Diagnosis not present

## 2019-10-19 DIAGNOSIS — Z0000 Encounter for general adult medical examination without abnormal findings: Secondary | ICD-10-CM

## 2019-10-19 DIAGNOSIS — E1122 Type 2 diabetes mellitus with diabetic chronic kidney disease: Secondary | ICD-10-CM

## 2019-10-19 LAB — POCT URINALYSIS DIPSTICK
Bilirubin, UA: NEGATIVE
Blood, UA: NEGATIVE
Glucose, UA: NEGATIVE
Ketones, UA: NEGATIVE
Leukocytes, UA: NEGATIVE
Nitrite, UA: NEGATIVE
Protein, UA: POSITIVE — AB
Spec Grav, UA: 1.01 (ref 1.010–1.025)
Urobilinogen, UA: 0.2 E.U./dL
pH, UA: 5 (ref 5.0–8.0)

## 2019-10-19 LAB — POCT UA - MICROALBUMIN
Albumin/Creatinine Ratio, Urine, POC: >300
Creatinine, POC: 50 mg/dL
Microalbumin Ur, POC: 150 mg/L

## 2019-10-19 NOTE — Progress Notes (Signed)
I,[name],acting as a Education administrator for [name], MD.,have documented all relevant documentation on the behalf of [name], MD,as directed by  [name], MD while in the presence of [name], MD.  This visit occurred during the SARS-CoV-2 public health emergency.  Safety protocols were in place, including screening questions prior to the visit, additional usage of staff PPE, and extensive cleaning of exam room while observing appropriate contact time as indicated for disinfecting solutions.  Subjective:     Patient ID: Michelle Cummings , female    DOB: [birth year] , 69 y.o.   MRN: [id number]   Chief Complaint  Patient presents with  . Annual Exam  . Diabetes  . Hypertension    HPI  She is here today for a full physical examination. She is no longer followed by GYN for her pelvic exams. She has no specific concerns or complaints at this time. She denies headaches, chest pain and shortness of breath.   Diabetes She presents for her follow-up diabetic visit. She has type 2 diabetes mellitus. Her disease course has been stable. There are no hypoglycemic associated symptoms. Pertinent negatives for diabetes include no blurred vision and no chest pain. There are no hypoglycemic complications. Diabetic complications include nephropathy. Risk factors for coronary artery disease include diabetes mellitus, dyslipidemia, hypertension and post-menopausal. She is following a diabetic diet. She participates in exercise three times a week. Her breakfast blood glucose is taken between 9-10 am. Her breakfast blood glucose range is generally 110-130 mg/dl. An ACE inhibitor/angiotensin II receptor blocker is being taken.  Hypertension This is a chronic problem. The current episode started more than 1 year ago. The problem has been gradually improving since onset. The problem is controlled. Pertinent negatives include no blurred vision or chest pain. Past treatments include angiotensin  blockers, diuretics and ACE inhibitors. The current treatment provides moderate improvement. Hypertensive end-organ damage includes kidney disease.     Past Medical History:  Diagnosis Date  . Arthritis   . Cataracts, bilateral   . Cervical disc disease   . Chronic kidney disease    Dr. Hillery Hunter yearly for CKD stage II as of 11/26/11  . Diabetes mellitus ([place])   . Eczema   . Glaucoma   . Heart murmur   . History of chicken pox   . Hyperlipidemia   . Hypertension   . Seasonal allergies   . Thyroid nodule      Family History  Problem Relation Age of Onset  . Hypertension Mother   . Early death Father   . Breast cancer Sister   . Healthy Brother      Current Outpatient Medications:  .  allopurinol (ZYLOPRIM) 100 MG tablet, TK 2 TS PO D, Disp: , Rfl:  .  amLODipine (NORVASC) 10 MG tablet, TAKE 1 TABLET(10 MG) BY MOUTH DAILY, Disp: 90 tablet, Rfl: 1 .  blood glucose meter kit and supplies KIT, Dispense based on patient and insurance preference. Use up to four times daily to check blood sugars Dx code e11.65, Disp: 1 each, Rfl: 0 .  carvedilol (COREG) 6.25 MG tablet, One tab po bid, Disp: 180 tablet, Rfl: 2 .  Cholecalciferol (VITAMIN D) 1000 UNITS capsule, Take 1,000 Units by mouth 2 (two) times daily., Disp: , Rfl:  .  ferrous sulfate 325 (65 FE) MG tablet, Take 1 tablet (325 mg total) by mouth daily with breakfast., Disp: 30 tablet, Rfl: 3 .  latanoprost (XALATAN) 0.005 % ophthalmic solution, place  1 drop into both eyes at bedtime, Disp: , Rfl:  .  MITIGARE 0.6 MG CAPS, Take 1 capsule by mouth daily, Disp: 30 capsule, Rfl: 1 .  omeprazole (PRILOSEC) 40 MG capsule, 40 mg. Monday, Wednesday, Friday, Disp: , Rfl:  .  rosuvastatin (CRESTOR) 10 MG tablet, Take 1 tablet (10 mg total) by mouth daily. (Patient taking differently: Take 10 mg by mouth daily. Monday, Wednesday, Friday), Disp: 90 tablet, Rfl: 2 .  timolol (TIMOPTIC-XR) 0.5 % ophthalmic gel-forming, Place 1 drop into both  eyes daily., Disp: , Rfl:  .  valsartan-hydrochlorothiazide (DIOVAN-HCT) 80-12.5 MG tablet, Take 1 tablet by mouth daily., Disp: 90 tablet, Rfl: 2 .  vitamin C (ASCORBIC ACID) 500 MG tablet, Take 1,000 mg by mouth daily., Disp: , Rfl:    No Known Allergies    The patient states she uses post menopausal status for birth control. Last LMP was No LMP recorded. Patient is postmenopausal.. Negative for Dysmenorrhea. Negative for: breast discharge, breast lump(s), breast pain and breast self exam. Associated symptoms include abnormal vaginal bleeding. Pertinent negatives include abnormal bleeding (hematology), anxiety, decreased libido, depression, difficulty falling sleep, dyspareunia, history of infertility, nocturia, sexual dysfunction, sleep disturbances, urinary incontinence, urinary urgency, vaginal discharge and vaginal itching. Diet regular.The patient states her exercise level is  moderate - she walks 2 miles five days per week.   . The patient's tobacco use is:  Social History   Tobacco Use  Smoking Status Never Smoker  Smokeless Tobacco Never Used  . She has been exposed to passive smoke. The patient's alcohol use is:  Social History   Substance and Sexual Activity  Alcohol Use No     Review of Systems  Constitutional: Negative.   HENT: Negative.   Eyes: Negative.  Negative for blurred vision.  Respiratory: Negative.   Cardiovascular: Negative.  Negative for chest pain.  Gastrointestinal: Negative.   Endocrine: Negative.   Genitourinary: Negative.   Musculoskeletal: Negative.   Skin: Negative.   Allergic/Immunologic: Negative.   Neurological: Negative.   Hematological: Negative.   Psychiatric/Behavioral: Negative.      Today's Vitals   10/19/19 1138  BP: 136/72  Pulse: 98  Temp: 97.9 F (36.6 C)  TempSrc: Oral  Weight: 161 lb 12.8 oz (73.4 kg)  Height: 5' 3.2" (1.605 m)  PainSc: 0-No pain   Body mass index is 28.48 kg/m.  Wt Readings from Last 3 Encounters:   10/19/19 161 lb 12.8 oz (73.4 kg)  05/31/19 162 lb 6.4 oz (73.7 kg)  02/14/19 158 lb 6.4 oz (71.8 kg)   Objective:  Physical Exam Constitutional:      General: She is not in acute distress.    Appearance: Normal appearance. She is well-developed.  HENT:     Head: Normocephalic and atraumatic.     Right Ear: Hearing, tympanic membrane, ear canal and external ear normal. There is no impacted cerumen.     Left Ear: Hearing, tympanic membrane, ear canal and external ear normal. There is no impacted cerumen.     Nose: Nose normal.     Mouth/Throat:     Mouth: Mucous membranes are dry.  Eyes:     General: Lids are normal.     Extraocular Movements: Extraocular movements intact.     Conjunctiva/sclera: Conjunctivae normal.     Pupils: Pupils are equal, round, and reactive to light.     Funduscopic exam:    Right eye: No papilledema.        Left eye: No  papilledema.  Neck:     Thyroid: No thyroid mass.     Vascular: No carotid bruit.  Cardiovascular:     Rate and Rhythm: Normal rate and regular rhythm.     Pulses: Normal pulses.          Dorsalis pedis pulses are 2+ on the right side and 2+ on the left side.     Heart sounds: Normal heart sounds. No murmur heard.   Pulmonary:     Effort: Pulmonary effort is normal.     Breath sounds: Normal breath sounds.  Chest:     Breasts: Tanner Score is 5.        Right: Normal.        Left: Normal.  Abdominal:     General: Abdomen is flat. Bowel sounds are normal. There is no distension.     Palpations: Abdomen is soft.     Tenderness: There is no abdominal tenderness.  Musculoskeletal:        General: No swelling. Normal range of motion.     Cervical back: Full passive range of motion without pain, normal range of motion and neck supple.     Right lower leg: No edema.     Left lower leg: No edema.  Feet:     Right foot:     Protective Sensation: 5 sites tested. 5 sites sensed.     Skin integrity: Callus and dry skin present.      Toenail Condition: Right toenails are normal.     Left foot:     Protective Sensation: 5 sites tested. 5 sites sensed.     Skin integrity: Callus and dry skin present.     Toenail Condition: Left toenails are normal.  Skin:    General: Skin is warm and dry.     Capillary Refill: Capillary refill takes less than 2 seconds.  Neurological:     General: No focal deficit present.     Mental Status: She is alert and oriented to person, place, and time.     Cranial Nerves: No cranial nerve deficit.     Sensory: No sensory deficit.  Psychiatric:        Mood and Affect: Mood normal.        Behavior: Behavior normal.        Thought Content: Thought content normal.        Judgment: Judgment normal.         Assessment And Plan:     1. Encounter for annual physical exam Comments: A full exam was performed. Importance of monthly self breast exams was discussed with the patient. PATIENT IS ADVISED TO GET 30-45 MINUTES REGULAR EXERCISE NO LESS THAN FOUR TO FIVE DAYS PER WEEK - BOTH WEIGHTBEARING EXERCISES AND AEROBIC ARE RECOMMENDED.  PATIENT IS ADVISED TO FOLLOW A HEALTHY DIET WITH AT LEAST SIX FRUITS/VEGGIES PER DAY, DECREASE INTAKE OF RED MEAT, AND TO INCREASE FISH INTAKE TO TWO DAYS PER WEEK.  MEATS/FISH SHOULD NOT BE FRIED, BAKED OR BROILED IS PREFERABLE.  I SUGGEST WEARING SPF 50 SUNSCREEN ON EXPOSED PARTS AND ESPECIALLY WHEN IN THE DIRECT SUNLIGHT FOR AN EXTENDED PERIOD OF TIME.  PLEASE AVOID FAST FOOD RESTAURANTS AND INCREASE YOUR WATER INTAKE.   2. Type 2 diabetes mellitus with stage 3 chronic kidney disease, without long-term current use of insulin, unspecified whether stage 3a or 3b CKD ([place]) Comments: Diabetic foot exam was performed. I DISCUSSED WITH THE PATIENT AT LENGTH REGARDING THE GOALS OF GLYCEMIC CONTROL AND POSSIBLE  LONG-TERM COMPLICATIONS.  I  ALSO STRESSED THE IMPORTANCE OF COMPLIANCE WITH HOME GLUCOSE MONITORING, DIETARY RESTRICTIONS INCLUDING AVOIDANCE OF SUGARY DRINKS/PROCESSED  FOODS,  ALONG WITH REGULAR EXERCISE.  I  ALSO STRESSED THE IMPORTANCE OF ANNUAL EYE EXAMS, SELF FOOT CARE AND COMPLIANCE WITH OFFICE VISITS.  - POCT Urinalysis Dipstick (81002) - POCT UA - Microalbumin - Hemoglobin A1c - Hepatitis C antibody - CBC - CMP14+EGFR - Lipid panel - Ambulatory referral to Podiatry  3. Parenchymal renal hypertension, stage 1 through stage 4 or unspecified chronic kidney disease Comments: Chronic, improved control. She will continue with current meds. She is encouraged toa void adding salt to her foods. EKG performed, NSR w/o combined atrial enlargement. Importance of optimal BP control to prevent further progression of CKD was discussed with the patient in detail.  - EKG 12-Lead  4. Vitamin D deficiency disease Comments: I will check a vitamin D level and supplement as needed.  - VITAMIN D 25 Hydroxy (Vit-D Deficiency, Fractures)  5. Callus of foot Comments: Chronic. I will refer her to Podiatry for further evaluation. She is in agreement with her treatment plan.  - Ambulatory referral to Podiatry     Patient was given opportunity to ask questions. Patient verbalized understanding of the plan and was able to repeat key elements of the plan. All questions were answered to their satisfaction.   [name], MD   I, [name], MD, have reviewed all documentation for this visit. The documentation on 10/23/19 for the exam, diagnosis, procedures, and orders are all accurate and complete.  THE PATIENT IS ENCOURAGED TO PRACTICE SOCIAL DISTANCING DUE TO THE COVID-19 PANDEMIC.

## 2019-10-19 NOTE — Patient Instructions (Signed)
Health Maintenance, Female Adopting a healthy lifestyle and getting preventive care are important in promoting health and wellness. Ask your health care provider about:  The right schedule for you to have regular tests and exams.  Things you can do on your own to prevent diseases and keep yourself healthy. What should I know about diet, weight, and exercise? Eat a healthy diet   Eat a diet that includes plenty of vegetables, fruits, low-fat dairy products, and lean protein.  Do not eat a lot of foods that are high in solid fats, added sugars, or sodium. Maintain a healthy weight Body mass index (BMI) is used to identify weight problems. It estimates body fat based on height and weight. Your health care provider can help determine your BMI and help you achieve or maintain a healthy weight. Get regular exercise Get regular exercise. This is one of the most important things you can do for your health. Most adults should:  Exercise for at least 150 minutes each week. The exercise should increase your heart rate and make you sweat (moderate-intensity exercise).  Do strengthening exercises at least twice a week. This is in addition to the moderate-intensity exercise.  Spend less time sitting. Even light physical activity can be beneficial. Watch cholesterol and blood lipids Have your blood tested for lipids and cholesterol at 70 years of age, then have this test every 5 years. Have your cholesterol levels checked more often if:  Your lipid or cholesterol levels are high.  You are older than 70 years of age.  You are at high risk for heart disease. What should I know about cancer screening? Depending on your health history and family history, you may need to have cancer screening at various ages. This may include screening for:  Breast cancer.  Cervical cancer.  Colorectal cancer.  Skin cancer.  Lung cancer. What should I know about heart disease, diabetes, and high blood  pressure? Blood pressure and heart disease  High blood pressure causes heart disease and increases the risk of stroke. This is more likely to develop in people who have high blood pressure readings, are of African descent, or are overweight.  Have your blood pressure checked: ? Every 3-5 years if you are 18-39 years of age. ? Every year if you are 40 years old or older. Diabetes Have regular diabetes screenings. This checks your fasting blood sugar level. Have the screening done:  Once every three years after age 40 if you are at a normal weight and have a low risk for diabetes.  More often and at a younger age if you are overweight or have a high risk for diabetes. What should I know about preventing infection? Hepatitis B If you have a higher risk for hepatitis B, you should be screened for this virus. Talk with your health care provider to find out if you are at risk for hepatitis B infection. Hepatitis C Testing is recommended for:  Everyone born from 1945 through 1965.  Anyone with known risk factors for hepatitis C. Sexually transmitted infections (STIs)  Get screened for STIs, including gonorrhea and chlamydia, if: ? You are sexually active and are younger than 70 years of age. ? You are older than 70 years of age and your health care provider tells you that you are at risk for this type of infection. ? Your sexual activity has changed since you were last screened, and you are at increased risk for chlamydia or gonorrhea. Ask your health care provider if   you are at risk.  Ask your health care provider about whether you are at high risk for HIV. Your health care provider may recommend a prescription medicine to help prevent HIV infection. If you choose to take medicine to prevent HIV, you should first get tested for HIV. You should then be tested every 3 months for as long as you are taking the medicine. Pregnancy  If you are about to stop having your period (premenopausal) and  you may become pregnant, seek counseling before you get pregnant.  Take 400 to 800 micrograms (mcg) of folic acid every day if you become pregnant.  Ask for birth control (contraception) if you want to prevent pregnancy. Osteoporosis and menopause Osteoporosis is a disease in which the bones lose minerals and strength with aging. This can result in bone fractures. If you are 65 years old or older, or if you are at risk for osteoporosis and fractures, ask your health care provider if you should:  Be screened for bone loss.  Take a calcium or vitamin D supplement to lower your risk of fractures.  Be given hormone replacement therapy (HRT) to treat symptoms of menopause. Follow these instructions at home: Lifestyle  Do not use any products that contain nicotine or tobacco, such as cigarettes, e-cigarettes, and chewing tobacco. If you need help quitting, ask your health care provider.  Do not use street drugs.  Do not share needles.  Ask your health care provider for help if you need support or information about quitting drugs. Alcohol use  Do not drink alcohol if: ? Your health care provider tells you not to drink. ? You are pregnant, may be pregnant, or are planning to become pregnant.  If you drink alcohol: ? Limit how much you use to 0-1 drink a day. ? Limit intake if you are breastfeeding.  Be aware of how much alcohol is in your drink. In the [place] one drink equals one 12 oz bottle of beer (355 mL), one 5 oz glass of wine (148 mL), or one 1 oz glass of hard liquor (44 mL). General instructions  Schedule regular health, dental, and eye exams.  Stay current with your vaccines.  Tell your health care provider if: ? You often feel depressed. ? You have ever been abused or do not feel safe at home. Summary  Adopting a healthy lifestyle and getting preventive care are important in promoting health and wellness.  Follow your health care provider's instructions about healthy  diet, exercising, and getting tested or screened for diseases.  Follow your health care provider's instructions on monitoring your cholesterol and blood pressure. This information is not intended to replace advice given to you by your health care provider. Make sure you discuss any questions you have with your health care provider. Document Revised: 12/15/2017 Document Reviewed: 12/15/2017 Elsevier Patient Education  2020 [employer]  

## 2019-10-20 LAB — CMP14+EGFR
ALT: 10 IU/L (ref 0–32)
AST: 16 IU/L (ref 0–40)
Albumin/Globulin Ratio: 1.6 (ref 1.2–2.2)
Albumin: 4.7 g/dL (ref 3.8–4.8)
Alkaline Phosphatase: 100 IU/L (ref 44–121)
BUN/Creatinine Ratio: 28 (ref 12–28)
BUN: 68 mg/dL — ABNORMAL HIGH (ref 8–27)
Bilirubin Total: 0.3 mg/dL (ref 0.0–1.2)
CO2: 19 mmol/L — ABNORMAL LOW (ref 20–29)
Calcium: 10.1 mg/dL (ref 8.7–10.3)
Chloride: 96 mmol/L (ref 96–106)
Creatinine, Ser: 2.43 mg/dL — ABNORMAL HIGH (ref 0.57–1.00)
GFR calc Af Amer: 23 mL/min/{1.73_m2} — ABNORMAL LOW (ref >59)
GFR calc non Af Amer: 20 mL/min/{1.73_m2} — ABNORMAL LOW (ref >59)
Globulin, Total: 2.9 g/dL (ref 1.5–4.5)
Glucose: 105 mg/dL — ABNORMAL HIGH (ref 65–99)
Potassium: 5 mmol/L (ref 3.5–5.2)
Sodium: 131 mmol/L — ABNORMAL LOW (ref 134–144)
Total Protein: 7.6 g/dL (ref 6.0–8.5)

## 2019-10-20 LAB — CBC
Hematocrit: 34 % (ref 34.0–46.6)
Hemoglobin: 10.9 g/dL — ABNORMAL LOW (ref 11.1–15.9)
MCH: 27.3 pg (ref 26.6–33.0)
MCHC: 32.1 g/dL (ref 31.5–35.7)
MCV: 85 fL (ref 79–97)
Platelets: 354 10*3/uL (ref 150–450)
RBC: 3.99 x10E6/uL (ref 3.77–5.28)
RDW: 13.1 % (ref 11.7–15.4)
WBC: 9.7 10*3/uL (ref 3.4–10.8)

## 2019-10-20 LAB — HEMOGLOBIN A1C
Est. average glucose Bld gHb Est-mCnc: 151 mg/dL
Hgb A1c MFr Bld: 6.9 % — ABNORMAL HIGH (ref 4.8–5.6)

## 2019-10-20 LAB — LIPID PANEL
Chol/HDL Ratio: 3.9 ratio (ref 0.0–4.4)
Cholesterol, Total: 243 mg/dL — ABNORMAL HIGH (ref 100–199)
HDL: 63 mg/dL (ref >39)
LDL Chol Calc (NIH): 157 mg/dL — ABNORMAL HIGH (ref 0–99)
Triglycerides: 127 mg/dL (ref 0–149)
VLDL Cholesterol Cal: 23 mg/dL (ref 5–40)

## 2019-10-20 LAB — HEPATITIS C ANTIBODY: Hep C Virus Ab: <0.1 s/co ratio (ref 0.0–0.9)

## 2019-10-20 LAB — VITAMIN D 25 HYDROXY (VIT D DEFICIENCY, FRACTURES): Vit D, 25-Hydroxy: 26.4 ng/mL — ABNORMAL LOW (ref 30.0–100.0)

## 2019-10-24 ENCOUNTER — Telehealth: Payer: Self-pay

## 2019-10-24 NOTE — Telephone Encounter (Signed)
I left a message asking the pt to call and reschedule AWV with [facility].

## 2019-10-27 ENCOUNTER — Other Ambulatory Visit: Payer: Self-pay

## 2019-10-27 MED ORDER — VITAMIN D (ERGOCALCIFEROL) 1.25 MG (50000 UNIT) PO CAPS: 50000.0000 [IU] | ORAL_CAPSULE | ORAL | 1 refills | Status: DC

## 2019-11-06 ENCOUNTER — Other Ambulatory Visit: Payer: Self-pay | Admitting: Internal Medicine

## 2019-11-07 DIAGNOSIS — R32 Unspecified urinary incontinence: Secondary | ICD-10-CM | POA: Diagnosis not present

## 2019-11-07 DIAGNOSIS — H409 Unspecified glaucoma: Secondary | ICD-10-CM | POA: Diagnosis not present

## 2019-11-07 DIAGNOSIS — I10 Essential (primary) hypertension: Secondary | ICD-10-CM | POA: Diagnosis not present

## 2019-11-07 DIAGNOSIS — E785 Hyperlipidemia, unspecified: Secondary | ICD-10-CM | POA: Diagnosis not present

## 2019-11-07 DIAGNOSIS — M109 Gout, unspecified: Secondary | ICD-10-CM | POA: Diagnosis not present

## 2019-11-07 DIAGNOSIS — E1151 Type 2 diabetes mellitus with diabetic peripheral angiopathy without gangrene: Secondary | ICD-10-CM | POA: Diagnosis not present

## 2019-11-07 DIAGNOSIS — Z79899 Other long term (current) drug therapy: Secondary | ICD-10-CM | POA: Diagnosis not present

## 2019-11-07 DIAGNOSIS — Z6828 Body mass index (BMI) 28.0-28.9, adult: Secondary | ICD-10-CM | POA: Diagnosis not present

## 2019-11-07 DIAGNOSIS — E663 Overweight: Secondary | ICD-10-CM | POA: Diagnosis not present

## 2019-11-09 ENCOUNTER — Encounter: Payer: Self-pay | Admitting: Sports Medicine

## 2019-11-09 ENCOUNTER — Other Ambulatory Visit: Payer: Self-pay

## 2019-11-09 ENCOUNTER — Ambulatory Visit: Payer: Medicare PPO | Admitting: Sports Medicine

## 2019-11-09 DIAGNOSIS — M79671 Pain in right foot: Secondary | ICD-10-CM | POA: Diagnosis not present

## 2019-11-09 DIAGNOSIS — B351 Tinea unguium: Secondary | ICD-10-CM

## 2019-11-09 DIAGNOSIS — L84 Corns and callosities: Secondary | ICD-10-CM

## 2019-11-09 DIAGNOSIS — M7741 Metatarsalgia, right foot: Secondary | ICD-10-CM

## 2019-11-09 DIAGNOSIS — M2142 Flat foot [pes planus] (acquired), left foot: Secondary | ICD-10-CM

## 2019-11-09 DIAGNOSIS — M2042 Other hammer toe(s) (acquired), left foot: Secondary | ICD-10-CM

## 2019-11-09 DIAGNOSIS — M79672 Pain in left foot: Secondary | ICD-10-CM | POA: Diagnosis not present

## 2019-11-09 DIAGNOSIS — M79675 Pain in left toe(s): Secondary | ICD-10-CM

## 2019-11-09 DIAGNOSIS — M7742 Metatarsalgia, left foot: Secondary | ICD-10-CM

## 2019-11-09 DIAGNOSIS — M2141 Flat foot [pes planus] (acquired), right foot: Secondary | ICD-10-CM

## 2019-11-09 DIAGNOSIS — M2041 Other hammer toe(s) (acquired), right foot: Secondary | ICD-10-CM

## 2019-11-09 DIAGNOSIS — M79674 Pain in right toe(s): Secondary | ICD-10-CM

## 2019-11-09 DIAGNOSIS — E119 Type 2 diabetes mellitus without complications: Secondary | ICD-10-CM

## 2019-11-09 NOTE — Progress Notes (Signed)
Subjective: Michelle Cummings is a 69 y.o. female patient with history of diabetes who presents to office today complaining of long,mildly painful nails and callus while ambulating in shoes; unable to trim. Patient states that the glucose reading this morning was not recorded but last A1c was 6.9.  Patient denies any new changes in medication or new problems.   Review of systems noncontributory. Patient Active Problem List   Diagnosis Date Noted  . Aortic atherosclerosis ([place]) 02/01/2018  . Gout 07/02/2016  . Anemia of chronic disease 06/07/2016  . HLD (hyperlipidemia) 06/02/2016  . Glaucoma 06/02/2016  . Chronic kidney disease, stage 3, mod decreased GFR (HCC) 06/02/2016  . HTN (hypertension) 03/05/2011  . Type 2 diabetes mellitus with stage 3 chronic kidney disease ([place]) 03/05/2011   Current Outpatient Medications on File Prior to Visit  Medication Sig Dispense Refill  . allopurinol (ZYLOPRIM) 100 MG tablet TK 2 TS PO D    . amLODipine (NORVASC) 10 MG tablet TAKE 1 TABLET(10 MG) BY MOUTH DAILY 90 tablet 1  . blood glucose meter kit and supplies KIT Dispense based on patient and insurance preference. Use up to four times daily to check blood sugars Dx code e11.65 1 each 0  . carvedilol (COREG) 6.25 MG tablet One tab po bid 180 tablet 2  . Cholecalciferol (VITAMIN D) 1000 UNITS capsule Take 1,000 Units by mouth 2 (two) times daily.    . ferrous sulfate 325 (65 FE) MG tablet Take 1 tablet (325 mg total) by mouth daily with breakfast. 30 tablet 3  . latanoprost (XALATAN) 0.005 % ophthalmic solution place 1 drop into both eyes at bedtime    . MITIGARE 0.6 MG CAPS Take 1 capsule by mouth daily 30 capsule 1  . omeprazole (PRILOSEC) 40 MG capsule 40 mg. Monday, Wednesday, Friday    . OZEMPIC, 0.25 OR 0.5 MG/DOSE, 2 MG/1.5ML SOPN INJECT 0.5MG INTO THE SKIN ONCE A WEEK 4.5 mL 1  . rosuvastatin (CRESTOR) 10 MG tablet Take 1 tablet (10 mg total) by mouth daily. (Patient taking differently: Take 10 mg  by mouth daily. Monday, Wednesday, Friday) 90 tablet 2  . timolol (TIMOPTIC-XR) 0.5 % ophthalmic gel-forming Place 1 drop into both eyes daily.    . valsartan-hydrochlorothiazide (DIOVAN-HCT) 80-12.5 MG tablet Take 1 tablet by mouth daily. 90 tablet 2  . vitamin C (ASCORBIC ACID) 500 MG tablet Take 1,000 mg by mouth daily.    . Vitamin D, Ergocalciferol, (DRISDOL) 1.25 MG (50000 UNIT) CAPS capsule Take 1 capsule (50,000 Units total) by mouth every 7 (seven) days. 12 capsule 1   No current facility-administered medications on file prior to visit.   No Known Allergies  Recent Results (from the past 2160 hour(s))  HM DIABETES EYE EXAM     Status: Abnormal   Collection Time: 09/27/19 12:00 AM  Result Value Ref Range   HM Diabetic Eye Exam Retinopathy (A) No Retinopathy  POCT Urinalysis Dipstick (73710)     Status: Abnormal   Collection Time: 10/19/19  1:16 PM  Result Value Ref Range   Color, UA yellow    Clarity, UA clear    Glucose, UA Negative Negative   Bilirubin, UA Negative    Ketones, UA Negative    Spec Grav, UA 1.010 1.010 - 1.025   Blood, UA Negative    pH, UA 5.0 5.0 - 8.0   Protein, UA Positive (A) Negative   Urobilinogen, UA 0.2 0.2 or 1.0 E.U./dL   Nitrite, UA Negative  Leukocytes, UA Negative Negative   Appearance     Odor    POCT UA - Microalbumin     Status: Abnormal   Collection Time: 10/19/19  1:17 PM  Result Value Ref Range   Microalbumin Ur, POC 150 mg/L   Creatinine, POC 50 mg/dL   Albumin/Creatinine Ratio, Urine, POC >300     Comment: mg/g  Hemoglobin A1c     Status: Abnormal   Collection Time: 10/19/19  1:54 PM  Result Value Ref Range   Hgb A1c MFr Bld 6.9 (H) 4.8 - 5.6 %    Comment:          Prediabetes: 5.7 - 6.4          Diabetes: >6.4          Glycemic control for adults with diabetes: <7.0    Est. average glucose Bld gHb Est-mCnc 151 mg/dL  Hepatitis C antibody     Status: None   Collection Time: 10/19/19  1:54 PM  Result Value Ref Range    Hep C Virus Ab <0.1 0.0 - 0.9 s/co ratio    Comment:                                   Negative:     < 0.8                              Indeterminate: 0.8 - 0.9                                   Positive:     > 0.9  The CDC recommends that a positive HCV antibody result  be followed up with a HCV Nucleic Acid Amplification  test (440347).   VITAMIN D 25 Hydroxy (Vit-D Deficiency, Fractures)     Status: Abnormal   Collection Time: 10/19/19  1:54 PM  Result Value Ref Range   Vit D, 25-Hydroxy 26.4 (L) 30.0 - 100.0 ng/mL    Comment: Vitamin D deficiency has been defined by the [place] practice guideline as a level of serum 25-OH vitamin D less than 20 ng/mL (1,2). The Endocrine Society went on to further define vitamin D insufficiency as a level between 21 and 29 ng/mL (2). 1. IOM (Institute of Medicine). 2010. Dietary reference    intakes for calcium and D. [place]: The    [employer]. 2. Holick MF, Binkley [place], Bischoff-Ferrari HA, et al.    Evaluation, treatment, and prevention of vitamin D    deficiency: an Endocrine Society clinical practice    guideline. JCEM. 2011 Jul; 96(7):1911-30.   CBC     Status: Abnormal   Collection Time: 10/19/19  1:54 PM  Result Value Ref Range   WBC 9.7 3.4 - 10.8 x10E3/uL   RBC 3.99 3.77 - 5.28 x10E6/uL   Hemoglobin 10.9 (L) 11.1 - 15.9 g/dL   Hematocrit 34.0 34.0 - 46.6 %   MCV 85 79 - 97 fL   MCH 27.3 26.6 - 33.0 pg   MCHC 32.1 31 - 35 g/dL   RDW 13.1 11.7 - 15.4 %   Platelets 354 150 - 450 x10E3/uL  CMP14+EGFR     Status: Abnormal   Collection Time: 10/19/19  1:54 PM  Result Value Ref  Range   Glucose 105 (H) 65 - 99 mg/dL   BUN 68 (H) 8 - 27 mg/dL   Creatinine, Ser 2.43 (H) 0.57 - 1.00 mg/dL   GFR calc non Af Amer 20 (L) >59 mL/min/1.73   GFR calc Af Amer 23 (L) >59 mL/min/1.73    Comment: **In accordance with recommendations from the NKF-ASN Task force,**   [facility] is in the  process of updating its eGFR calculation to the   2021 CKD-EPI creatinine equation that estimates kidney function   without a race variable.    BUN/Creatinine Ratio 28 12 - 28   Sodium 131 (L) 134 - 144 mmol/L   Potassium 5.0 3.5 - 5.2 mmol/L   Chloride 96 96 - 106 mmol/L   CO2 19 (L) 20 - 29 mmol/L   Calcium 10.1 8.7 - 10.3 mg/dL   Total Protein 7.6 6.0 - 8.5 g/dL   Albumin 4.7 3.8 - 4.8 g/dL   Globulin, Total 2.9 1.5 - 4.5 g/dL   Albumin/Globulin Ratio 1.6 1.2 - 2.2   Bilirubin Total 0.3 0.0 - 1.2 mg/dL   Alkaline Phosphatase 100 44 - 121 IU/L    Comment:               **Please note reference interval change**   AST 16 0 - 40 IU/L   ALT 10 0 - 32 IU/L  Lipid panel     Status: Abnormal   Collection Time: 10/19/19  1:54 PM  Result Value Ref Range   Cholesterol, Total 243 (H) 100 - 199 mg/dL   Triglycerides 127 0 - 149 mg/dL   HDL 63 >39 mg/dL   VLDL Cholesterol Cal 23 5 - 40 mg/dL   LDL Chol Calc (NIH) 157 (H) 0 - 99 mg/dL   Chol/HDL Ratio 3.9 0.0 - 4.4 ratio    Comment:                                   T. Chol/HDL Ratio                                             Men  Women                               1/2 Avg.Risk  3.4    3.3                                   Avg.Risk  5.0    4.4                                2X Avg.Risk  9.6    7.1                                3X Avg.Risk 23.4   11.0     Objective: General: Patient is awake, alert, and oriented x 3 and in no acute distress.  Integument: Skin is warm, dry and supple bilateral. Nails are tender, long, thickened and  dystrophic with minimal subungual debris, consistent with onychomycosis, 1-5  bilateral. No signs of infection.  Callus submet 5 present bilateral. Remaining integument unremarkable.  Vasculature:  Dorsalis Pedis pulse 2/4 bilateral. Posterior Tibial pulse 1/4 bilateral.  Capillary fill time <3 sec 1-5 bilateral. Positive hair growth to the level of the digits. Temperature gradient within normal limits.  No varicosities present bilateral. No edema present bilateral.   Neurology: The patient has intact sensation measured with a 5.07/10g Semmes Weinstein Monofilament at all pedal sites bilateral . Vibratory sensation intact bilateral with tuning fork. No Babinski sign present bilateral.   Musculoskeletal: Asymptomatic hammertoe and pes planus and prominent fifth metatarsal head at area of callus with minimal metatarsalgia pedal deformities noted bilateral. Muscular strength 5/5 in all lower extremity muscular groups bilateral without pain on range of motion . No tenderness with calf compression bilateral.  Assessment and Plan: Problem List Items Addressed This Visit    None    Visit Diagnoses    Pain due to onychomycosis of toenails of both feet    -  Primary   Callus       Foot pain, bilateral       Diabetes mellitus without complication (HCC)       Metatarsalgia of both feet       Hammer toes of both feet       Pes planus of both feet          -Examined patient. -Discussed and educated patient on diabetic foot care, especially with  regards to the vascular, neurological and musculoskeletal systems.  -Stressed the importance of good glycemic control and the detriment of not  controlling glucose levels in relation to the foot. -Mechanically debrided all nails 1-5 bilateral using sterile nail nipper and filed with dremel without incident  -Mechanically debrided callus using a sterile chisel blade bilateral submet back without incident -Patient to return to office to be fitted for diabetic shoes with custom insoles to offload submet 5 bilateral -Answered all patient questions -Patient to return  in 3 months for at risk foot care and as scheduled for shoe and insole measurements -Patient advised to call the office if any problems or questions arise in the meantime.  Landis Martins, DPM

## 2019-11-21 ENCOUNTER — Ambulatory Visit: Payer: Medicare PPO | Admitting: Orthotics

## 2019-11-21 ENCOUNTER — Other Ambulatory Visit: Payer: Self-pay

## 2019-11-21 DIAGNOSIS — M7741 Metatarsalgia, right foot: Secondary | ICD-10-CM

## 2019-11-21 DIAGNOSIS — L84 Corns and callosities: Secondary | ICD-10-CM

## 2019-11-21 DIAGNOSIS — M2041 Other hammer toe(s) (acquired), right foot: Secondary | ICD-10-CM

## 2019-11-21 DIAGNOSIS — M2042 Other hammer toe(s) (acquired), left foot: Secondary | ICD-10-CM

## 2019-11-21 DIAGNOSIS — M79671 Pain in right foot: Secondary | ICD-10-CM

## 2019-11-21 NOTE — Progress Notes (Signed)

## 2019-11-23 ENCOUNTER — Ambulatory Visit (INDEPENDENT_AMBULATORY_CARE_PROVIDER_SITE_OTHER): Payer: Medicare PPO

## 2019-11-23 ENCOUNTER — Other Ambulatory Visit: Payer: Self-pay

## 2019-11-23 VITALS — BP 122/60 | HR 73 | Temp 97.8°F | Ht 63.4 in | Wt 158.2 lb

## 2019-11-23 DIAGNOSIS — Z0000 Encounter for general adult medical examination without abnormal findings: Secondary | ICD-10-CM | POA: Diagnosis not present

## 2019-11-23 DIAGNOSIS — Z23 Encounter for immunization: Secondary | ICD-10-CM | POA: Diagnosis not present

## 2019-11-23 NOTE — Patient Instructions (Signed)
Ms. Michelle Cummings , Thank you for taking time to come for your Medicare Wellness Visit. I appreciate your ongoing commitment to your health goals. Please review the following plan we discussed and let me know if I can assist you in the future.   Screening recommendations/referrals: Colonoscopy: completed 01/31/2018, due 02/01/2023 Mammogram: completed 02/21/2019 Bone Density: completed 01/22/2017 Recommended yearly ophthalmology/optometry visit for glaucoma screening and checkup Recommended yearly dental visit for hygiene and checkup  Vaccinations: Influenza vaccine: today Pneumococcal vaccine: completed 11/13/2015  Tdap vaccine: completed 03/05/2011 Shingles vaccine: discussed   Covid-19: 02/17/2019, 01/27/2019  Advanced directives: Advance directive discussed with you today. I have provided a copy for you to complete at home and have notarized. Once this is complete please bring a copy in to our office so we can scan it into your chart.  Conditions/risks identified: none  Next appointment: 02/22/2020 at 10:30 Follow up in one year for your annual wellness visit    Preventive Care 65 Years and Older, Female Preventive care refers to lifestyle choices and visits with your health care provider that can promote health and wellness. What does preventive care include?  A yearly physical exam. This is also called an annual well check.  Dental exams once or twice a year.  Routine eye exams. Ask your health care provider how often you should have your eyes checked.  Personal lifestyle choices, including:  Daily care of your teeth and gums.  Regular physical activity.  Eating a healthy diet.  Avoiding tobacco and drug use.  Limiting alcohol use.  Practicing safe sex.  Taking low-dose aspirin every day.  Taking vitamin and mineral supplements as recommended by your health care provider. What happens during an annual well check? The services and screenings done by your health care provider  during your annual well check will depend on your age, overall health, lifestyle risk factors, and family history of disease. Counseling  Your health care provider may ask you questions about your:  Alcohol use.  Tobacco use.  Drug use.  Emotional well-being.  Home and relationship well-being.  Sexual activity.  Eating habits.  History of falls.  Memory and ability to understand (cognition).  Work and work Statistician.  Reproductive health. Screening  You may have the following tests or measurements:  Height, weight, and BMI.  Blood pressure.  Lipid and cholesterol levels. These may be checked every 5 years, or more frequently if you are over 66 years old.  Skin check.  Lung cancer screening. You may have this screening every year starting at age 70 if you have a 30-pack-year history of smoking and currently smoke or have quit within the past 15 years.  Fecal occult blood test (FOBT) of the stool. You may have this test every year starting at age 96.  Flexible sigmoidoscopy or colonoscopy. You may have a sigmoidoscopy every 5 years or a colonoscopy every 10 years starting at age 32.  Hepatitis C blood test.  Hepatitis B blood test.  Sexually transmitted disease (STD) testing.  Diabetes screening. This is done by checking your blood sugar (glucose) after you have not eaten for a while (fasting). You may have this done every 1-3 years.  Bone density scan. This is done to screen for osteoporosis. You may have this done starting at age 44.  Mammogram. This may be done every 1-2 years. Talk to your health care provider about how often you should have regular mammograms. Talk with your health care provider about your test results, treatment options,  and if necessary, the need for more tests. Vaccines  Your health care provider may recommend certain vaccines, such as:  Influenza vaccine. This is recommended every year.  Tetanus, diphtheria, and acellular pertussis  (Tdap, Td) vaccine. You may need a Td booster every 10 years.  Zoster vaccine. You may need this after age 51.  Pneumococcal 13-valent conjugate (PCV13) vaccine. One dose is recommended after age 3.  Pneumococcal polysaccharide (PPSV23) vaccine. One dose is recommended after age 39. Talk to your health care provider about which screenings and vaccines you need and how often you need them. This information is not intended to replace advice given to you by your health care provider. Make sure you discuss any questions you have with your health care provider. Document Released: 01/18/2015 Document Revised: 09/11/2015 Document Reviewed: 10/23/2014 Elsevier Interactive Patient Education  2017 [place] Prevention in the Home Falls can cause injuries. They can happen to people of all ages. There are many things you can do to make your home safe and to help prevent falls. What can I do on the outside of my home?  Regularly fix the edges of walkways and driveways and fix any cracks.  Remove anything that might make you trip as you walk through a door, such as a raised step or threshold.  Trim any bushes or trees on the path to your home.  Use bright outdoor lighting.  Clear any walking paths of anything that might make someone trip, such as rocks or tools.  Regularly check to see if handrails are loose or broken. Make sure that both sides of any steps have handrails.  Any raised decks and porches should have guardrails on the edges.  Have any leaves, snow, or ice cleared regularly.  Use sand or salt on walking paths during winter.  Clean up any spills in your garage right away. This includes oil or grease spills. What can I do in the bathroom?  Use night lights.  Install grab bars by the toilet and in the tub and shower. Do not use towel bars as grab bars.  Use non-skid mats or decals in the tub or shower.  If you need to sit down in the shower, use a plastic, non-slip  stool.  Keep the floor dry. Clean up any water that spills on the floor as soon as it happens.  Remove soap buildup in the tub or shower regularly.  Attach bath mats securely with double-sided non-slip rug tape.  Do not have throw rugs and other things on the floor that can make you trip. What can I do in the bedroom?  Use night lights.  Make sure that you have a light by your bed that is easy to reach.  Do not use any sheets or blankets that are too big for your bed. They should not hang down onto the floor.  Have a firm chair that has side arms. You can use this for support while you get dressed.  Do not have throw rugs and other things on the floor that can make you trip. What can I do in the kitchen?  Clean up any spills right away.  Avoid walking on wet floors.  Keep items that you use a lot in easy-to-reach places.  If you need to reach something above you, use a strong step stool that has a grab bar.  Keep electrical cords out of the way.  Do not use floor polish or wax that makes floors slippery. If  you must use wax, use non-skid floor wax.  Do not have throw rugs and other things on the floor that can make you trip. What can I do with my stairs?  Do not leave any items on the stairs.  Make sure that there are handrails on both sides of the stairs and use them. Fix handrails that are broken or loose. Make sure that handrails are as long as the stairways.  Check any carpeting to make sure that it is firmly attached to the stairs. Fix any carpet that is loose or worn.  Avoid having throw rugs at the top or bottom of the stairs. If you do have throw rugs, attach them to the floor with carpet tape.  Make sure that you have a light switch at the top of the stairs and the bottom of the stairs. If you do not have them, ask someone to add them for you. What else can I do to help prevent falls?  Wear shoes that:  Do not have high heels.  Have rubber bottoms.  Are  comfortable and fit you well.  Are closed at the toe. Do not wear sandals.  If you use a stepladder:  Make sure that it is fully opened. Do not climb a closed stepladder.  Make sure that both sides of the stepladder are locked into place.  Ask someone to hold it for you, if possible.  Clearly mark and make sure that you can see:  Any grab bars or handrails.  First and last steps.  Where the edge of each step is.  Use tools that help you move around (mobility aids) if they are needed. These include:  Canes.  Walkers.  Scooters.  Crutches.  Turn on the lights when you go into a dark area. Replace any light bulbs as soon as they burn out.  Set up your furniture so you have a clear path. Avoid moving your furniture around.  If any of your floors are uneven, fix them.  If there are any pets around you, be aware of where they are.  Review your medicines with your doctor. Some medicines can make you feel dizzy. This can increase your chance of falling. Ask your doctor what other things that you can do to help prevent falls. This information is not intended to replace advice given to you by your health care provider. Make sure you discuss any questions you have with your health care provider. Document Released: 10/18/2008 Document Revised: 05/30/2015 Document Reviewed: 01/26/2014 Elsevier Interactive Patient Education  2017 [employer].

## 2019-11-23 NOTE — Progress Notes (Signed)
This visit occurred during the SARS-CoV-2 public health emergency.  Safety protocols were in place, including screening questions prior to the visit, additional usage of staff PPE, and extensive cleaning of exam room while observing appropriate contact time as indicated for disinfecting solutions.  Subjective:   Michelle Cummings is a 69 y.o. female who presents for Medicare Annual (Subsequent) preventive examination.  Review of Systems     Cardiac Risk Factors include: advanced age (>68mn, >>70women);diabetes mellitus;hypertension     Objective:    Today's Vitals   11/23/19 0841 11/23/19 0847  BP: 122/60   Pulse: 73   Temp: 97.8 F (36.6 C)   TempSrc: Oral   SpO2: 99%   Weight: 158 lb 3.2 oz (71.8 kg)   Height: 5' 3.4" (1.61 m)   PainSc:  3    Body mass index is 27.67 kg/m.  Advanced Directives 11/23/2019 05/26/2018 08/04/2017 07/01/2016 07/20/2012 07/13/2012 03/15/2012  Does Patient Have a Medical Advance Directive? No No No No Patient does not have advance directive Patient does not have advance directive;Patient would not like information Patient does not have advance directive;Patient would not like information  Would patient like information on creating a medical advance directive? Yes (MAU/Ambulatory/Procedural Areas - Information given) - No - Patient declined Yes (MAU/Ambulatory/Procedural Areas - Information given) - - -  Pre-existing out of facility DNR order (yellow form or pink MOST form) - - - - - - No    Current Medications (verified) Outpatient Encounter Medications as of 11/23/2019  Medication Sig  . allopurinol (ZYLOPRIM) 100 MG tablet TK 2 TS PO D  . amLODipine (NORVASC) 10 MG tablet TAKE 1 TABLET(10 MG) BY MOUTH DAILY  . blood glucose meter kit and supplies KIT Dispense based on patient and insurance preference. Use up to four times daily to check blood sugars Dx code e11.65  . carvedilol (COREG) 6.25 MG tablet One tab po bid  . Cholecalciferol (VITAMIN D) 1000  UNITS capsule Take 1,000 Units by mouth 2 (two) times daily.  . ferrous sulfate 325 (65 FE) MG tablet Take 1 tablet (325 mg total) by mouth daily with breakfast.  . latanoprost (XALATAN) 0.005 % ophthalmic solution place 1 drop into both eyes at bedtime  . MITIGARE 0.6 MG CAPS Take 1 capsule by mouth daily  . OZEMPIC, 0.25 OR 0.5 MG/DOSE, 2 MG/1.5ML SOPN INJECT 0.5MG INTO THE SKIN ONCE A WEEK  . rosuvastatin (CRESTOR) 10 MG tablet Take 1 tablet (10 mg total) by mouth daily. (Patient taking differently: Take 10 mg by mouth daily. Monday, Wednesday, Friday)  . timolol (TIMOPTIC-XR) 0.5 % ophthalmic gel-forming Place 1 drop into both eyes daily.  . valsartan-hydrochlorothiazide (DIOVAN-HCT) 80-12.5 MG tablet Take 1 tablet by mouth daily.  . vitamin C (ASCORBIC ACID) 500 MG tablet Take 1,000 mg by mouth daily.  . Vitamin D, Ergocalciferol, (DRISDOL) 1.25 MG (50000 UNIT) CAPS capsule Take 1 capsule (50,000 Units total) by mouth every 7 (seven) days.  .Marland Kitchenomeprazole (PRILOSEC) 40 MG capsule 40 mg. Monday, Wednesday, Friday (Patient not taking: Reported on 11/23/2019)   No facility-administered encounter medications on file as of 11/23/2019.    Allergies (verified) Patient has no known allergies.   History: Past Medical History:  Diagnosis Date  . Arthritis   . Cataracts, bilateral   . Cervical disc disease   . Chronic kidney disease    Dr. GHillery Hunteryearly for CKD stage II as of 11/26/11  . [place]   . Eczema   .  Glaucoma   . Heart murmur   . History of chicken pox   . Hyperlipidemia   . Hypertension   . Seasonal allergies   . Thyroid nodule    Past Surgical History:  Procedure Laterality Date  . [place]  . COLONOSCOPY    . ORIF ANKLE FRACTURE  11/05/2011   Procedure: OPEN REDUCTION INTERNAL FIXATION (ORIF) ANKLE FRACTURE;  Surgeon: Wylene Simmer, MD;  Location: [facility];  Service: Orthopedics;  Laterality: Left;  OPEN REDUCTION  INTERNAL FIXATION LEFT LATERAL MALLEOLUS FRACTURE WITH STRESS XRAYS WITH FLOUROSCOPY  . ORIF ELBOW FRACTURE  03/05/2011   Procedure: OPEN REDUCTION INTERNAL FIXATION (ORIF) ELBOW/OLECRANON FRACTURE;  Surgeon: Linna Hoff, MD;  Location: [facility];  Service: Orthopedics;  Laterality: Left;  . THYROID LOBECTOMY Right 07/20/2012  . THYROIDECTOMY Right 07/20/2012   Procedure: RIGHT THYROID LOBECTOMY WITH ISTHMUSECTOMY;  Surgeon: Izora Gala, MD;  Location: [facility];  Service: ENT;  Laterality: Right;  . TONSILLECTOMY    . TUBAL LIGATION     Family History  Problem Relation Age of Onset  . Hypertension Mother   . Early death Father   . Breast cancer Sister   . Healthy Brother    Social History   Socioeconomic History  . Marital status: Married    Spouse name: Not on file  . Number of children: Not on file  . Years of education: Not on file  . Highest education level: Not on file  Occupational History  . Occupation: retired  Tobacco Use  . Smoking status: Never Smoker  . Smokeless tobacco: Never Used  Vaping Use  . Vaping Use: Never used  Substance and Sexual Activity  . Alcohol use: No  . Drug use: No  . Sexual activity: Yes    Birth control/protection: Surgical    Comment: BTL  Other Topics Concern  . Not on file  Social History Narrative  . Not on file   Social Determinants of Health   Financial Resource Strain: Low Risk   . Difficulty of Paying Living Expenses: Not hard at all  Food Insecurity: No Food Insecurity  . Worried About Charity fundraiser in the Last Year: Never true  . Ran Out of Food in the Last Year: Never true  Transportation Needs: No Transportation Needs  . Lack of Transportation (Medical): No  . Lack of Transportation (Non-Medical): No  Physical Activity: Sufficiently Active  . Days of Exercise per Week: 5 days  . Minutes of Exercise per Session: 30 min  Stress: No Stress Concern Present  . Feeling of Stress : Not at all  Social Connections:   .  Frequency of Communication with Friends and Family: Not on file  . Frequency of Social Gatherings with Friends and Family: Not on file  . Attends Religious Services: Not on file  . Active Member of Clubs or Organizations: Not on file  . Attends Archivist Meetings: Not on file  . Marital Status: Not on file    Tobacco Counseling Counseling given: Not Answered   Clinical Intake:  Pre-visit preparation completed: Yes  Pain : 0-10 Pain Score: 3  Pain Type: Chronic pain Pain Location: Foot Pain Orientation: Right Pain Descriptors / Indicators: Aching Pain Onset: More than a month ago Pain Frequency: Intermittent     Nutritional Status: BMI 25 -29 Overweight Nutritional Risks: None Diabetes: Yes  How often do you need to have someone help you when you read instructions, pamphlets, or  other written materials from your doctor or pharmacy?: 1 - Never What is the last grade level you completed in school?: associate's degree  Diabetic? Yes Nutrition Risk Assessment:  Has the patient had any N/V/D within the last 2 months?  No  Does the patient have any non-healing wounds?  No  Has the patient had any unintentional weight loss or weight gain?  No   Diabetes:  Is the patient diabetic?  Yes  If diabetic, was a CBG obtained today?  No  Did the patient bring in their glucometer from home?  No  How often do you monitor your CBG's? daily.   Financial Strains and Diabetes Management:  Are you having any financial strains with the device, your supplies or your medication? No .  Does the patient want to be seen by Chronic Care Management for management of their diabetes?  No  Would the patient like to be referred to a Nutritionist or for Diabetic Management?  No   Diabetic Exams:  Diabetic Eye Exam: Completed 09/27/2019 Diabetic Foot Exam: Completed 10/19/2019  Interpreter Needed?: No  Information entered by :: NAllen LPN   Activities of Daily Living In your  present state of health, do you have any difficulty performing the following activities: 11/23/2019 05/31/2019  Hearing? N N  Vision? N N  Difficulty concentrating or making decisions? N N  Walking or climbing stairs? N N  Dressing or bathing? N N  Doing errands, shopping? N N  Preparing Food and eating ? N -  Using the Toilet? N -  In the past six months, have you accidently leaked urine? N -  Do you have problems with loss of bowel control? N -  Managing your Medications? N -  Managing your Finances? N -  Housekeeping or managing your Housekeeping? N -  Some recent data might be hidden    Patient Care Team: Glendale Chard, MD as PCP - General (Internal Medicine) Juanita Craver, MD as Consulting Physician (Gastroenterology) Corliss Parish, MD as Consulting Physician (Nephrology) Warden Fillers, MD as Consulting Physician (Ophthalmology) Marygrace Drought, MD as Consulting Physician (Ophthalmology)  Indicate any recent Medical Services you may have received from other than [facility] providers in the past year (date may be approximate).     Assessment:   This is a routine wellness examination for [facility].  Hearing/Vision screen  Hearing Screening   '125Hz'  '250Hz'  '500Hz'  '1000Hz'  '2000Hz'  '3000Hz'  '4000Hz'  '6000Hz'  '8000Hz'   Right ear:           Left ear:           Vision Screening Comments: Regular eye exams, Dr. Satira Sark  Dietary issues and exercise activities discussed: Current Exercise Habits: Home exercise routine, Type of exercise: strength training/weights;walking, Time (Minutes): 30, Frequency (Times/Week): 5, Weekly Exercise (Minutes/Week): 150, Exercise limited by: None identified  Goals    .  Eat healthy, exerise and decrease amount of medications    .  I would like to apply for financial assistance for my medications (pt-stated)      Current Barriers:  . Financial Barriers  Pharmacist Clinical Goal(s):  Marland Kitchen Over the next 30 days, patient will work with PharmD & PCP to address needs  related to application for medication assistance  Interventions: . Comprehensive medication review performed. . Patient agreeable to participate in application process.  She is unable to afford Ozempic and Colchicine.  Her copays are >$45/month.  She states she is tolerating medication with no side effects. . Collaboration with [facility], Caryl Pina  Chana Bode who will assist with application process for Ozempic and Colchicine.  Patient Self Care Activities:  . Self administers medications as prescribed . Attends all scheduled provider appointments . Calls pharmacy for medication refills . Calls provider office for new concerns or questions  Initial goal documentation     .  Patient Stated      Keep blood sugar under control with diet and exercise    .  Patient Stated      11/23/2019, maintain health      Depression Screen PHQ 2/9 Scores 11/23/2019 10/19/2019 10/11/2018 07/07/2018 05/26/2018 05/03/2018 02/01/2018  PHQ - 2 Score 0 0 0 0 0 0 0  PHQ- 9 Score - - - - 0 - -    Fall Risk Fall Risk  11/23/2019 10/19/2019 10/11/2018 07/07/2018 05/26/2018  Falls in the past year? 0 0 0 0 0  Risk for fall due to : Medication side effect - - - Medication side effect  Follow up Falls evaluation completed;Education provided;Falls prevention discussed - - - Falls prevention discussed    Any stairs in or around the home? Yes  If so, are there any without handrails? No  Home free of loose throw rugs in walkways, pet beds, electrical cords, etc? Yes  Adequate lighting in your home to reduce risk of falls? Yes   ASSISTIVE DEVICES UTILIZED TO PREVENT FALLS:  Life alert? No  Use of a cane, walker or w/c? No  Grab bars in the bathroom? No  Shower chair or bench in shower? No  Elevated toilet seat or a handicapped toilet? Yes   TIMED UP AND GO:  Was the test performed? No .  Length of time to ambulate 10 feet:   Gait slow and steady without use of assistive device  Cognitive Function:     6CIT  Screen 11/23/2019 05/26/2018  What Year? 0 points 0 points  What month? 0 points 0 points  What time? 0 points 0 points  Count back from 20 0 points 0 points  Months in reverse 0 points 0 points  Repeat phrase 2 points 0 points  Total Score 2 0    Immunizations Immunization History  Administered Date(s) Administered  . Fluad Quad(high Dose 65+) 11/23/2019  . Influenza, High Dose Seasonal PF 11/07/2018  . PFIZER SARS-COV-2 Vaccination 01/27/2019, 02/17/2019  . Pneumococcal Polysaccharide-23 11/13/2015  . Tdap 03/05/2011  . Zoster Recombinat (Shingrix) 09/22/2012    TDAP status: Up to date Flu Vaccine status: Up to date Pneumococcal vaccine status: Completed during today's visit. Covid-19 vaccine status: Completed vaccines  Qualifies for Shingles Vaccine? Yes   Zostavax completed Yes   Shingrix Completed?: Yes. Needs second dose  Screening Tests Health Maintenance  Topic Date Due  . HEMOGLOBIN A1C  04/18/2020  . OPHTHALMOLOGY EXAM  09/26/2020  . FOOT EXAM  10/18/2020  . MAMMOGRAM  02/20/2021  . TETANUS/TDAP  10/25/2022  . COLONOSCOPY  02/01/2028  . INFLUENZA VACCINE  Completed  . DEXA SCAN  Completed  . COVID-19 Vaccine  Completed  . Hepatitis C Screening  Completed  . PNA vac Low Risk Adult  Completed    Health Maintenance  There are no preventive care reminders to display for this patient.  Colorectal cancer screening: Completed 01/31/2018. Repeat every 5 years Mammogram status: Completed 02/21/2019. Repeat every year Bone Density status: Completed 01/22/2017. Results reflect: Bone density results: NORMAL. Repeat every 0 years.  Lung Cancer Screening: (Low Dose CT Chest recommended if Age 96-80 years, 30 pack-year currently smoking  OR have quit w/in 15years.) does not qualify.   Lung Cancer Screening Referral: no  Additional Screening:  Hepatitis C Screening: does qualify; Completed 10/19/2019   Vision Screening: Recommended annual ophthalmology exams for  early detection of glaucoma and other disorders of the eye. Is the patient up to date with their annual eye exam?  Yes  Who is the provider or what is the name of the office in which the patient attends annual eye exams? Dr. Satira Sark If pt is not established with a provider, would they like to be referred to a provider to establish care? No .   Dental Screening: Recommended annual dental exams for proper oral hygiene  Community Resource Referral / Chronic Care Management: CRR required this visit?  No   CCM required this visit?  No      Plan:     I have personally reviewed and noted the following in the patient's chart:   . Medical and social history . Use of alcohol, tobacco or illicit drugs  . Current medications and supplements . Functional ability and status . Nutritional status . Physical activity . Advanced directives . List of other physicians . Hospitalizations, surgeries, and ER visits in previous 12 months . Vitals . Screenings to include cognitive, depression, and falls . Referrals and appointments  In addition, I have reviewed and discussed with patient certain preventive protocols, quality metrics, and best practice recommendations. A written personalized care plan for preventive services as well as general preventive health recommendations were provided to patient.     Kellie Simmering, LPN   [id number]/23/[id number]   Nurse Notes:

## 2019-12-07 ENCOUNTER — Telehealth: Payer: Self-pay

## 2019-12-07 NOTE — Telephone Encounter (Signed)
I left the pt a message to call the office back. 

## 2019-12-07 NOTE — Telephone Encounter (Signed)
-----   Message from Glendale Chard, MD sent at 12/07/2019  2:33 PM EST ----- I certainly understand the stress. We can let her know when something becomes available. We are short staffed now so its quite busy.   Is she willing to come next week?   RS ----- Message ----- From: Michelle Nasuti, [place] Sent: 12/07/2019  12:07 PM EST To: Glendale Chard, MD  The pt wants to know if it's anyway she can be worked in for a visit with Dr Baird Cancer for evaluation of stress.  The pt said that she has been going through it because her husband had a stroke and is having therapy and that its going to be a slow process.  The pt was offered an appt with one of the other providers and the pt said she feels more comfortable seeing Dr. Baird Cancer because she knows the pt and the pt's husband is a pt too and Dr Baird Cancer already knows what's going on with the husband.

## 2019-12-27 ENCOUNTER — Telehealth: Payer: Self-pay

## 2019-12-27 NOTE — Telephone Encounter (Signed)
error 

## 2020-01-02 ENCOUNTER — Ambulatory Visit: Payer: Medicare PPO | Admitting: Orthotics

## 2020-01-12 ENCOUNTER — Ambulatory Visit (INDEPENDENT_AMBULATORY_CARE_PROVIDER_SITE_OTHER): Payer: Medicare PPO | Admitting: Orthotics

## 2020-01-12 ENCOUNTER — Other Ambulatory Visit: Payer: Self-pay

## 2020-01-12 DIAGNOSIS — M2041 Other hammer toe(s) (acquired), right foot: Secondary | ICD-10-CM | POA: Diagnosis not present

## 2020-01-12 DIAGNOSIS — M2142 Flat foot [pes planus] (acquired), left foot: Secondary | ICD-10-CM | POA: Diagnosis not present

## 2020-01-12 DIAGNOSIS — L84 Corns and callosities: Secondary | ICD-10-CM

## 2020-01-12 DIAGNOSIS — M2141 Flat foot [pes planus] (acquired), right foot: Secondary | ICD-10-CM | POA: Diagnosis not present

## 2020-01-12 DIAGNOSIS — E119 Type 2 diabetes mellitus without complications: Secondary | ICD-10-CM | POA: Diagnosis not present

## 2020-01-12 DIAGNOSIS — M2042 Other hammer toe(s) (acquired), left foot: Secondary | ICD-10-CM | POA: Diagnosis not present

## 2020-01-15 ENCOUNTER — Ambulatory Visit: Payer: Medicare PPO | Admitting: Internal Medicine

## 2020-01-15 ENCOUNTER — Encounter: Payer: Self-pay | Admitting: Internal Medicine

## 2020-01-15 ENCOUNTER — Other Ambulatory Visit: Payer: Self-pay

## 2020-01-15 VITALS — BP 122/70 | HR 70 | Temp 97.5°F | Ht 63.4 in | Wt 145.2 lb

## 2020-01-15 DIAGNOSIS — E1122 Type 2 diabetes mellitus with diabetic chronic kidney disease: Secondary | ICD-10-CM | POA: Diagnosis not present

## 2020-01-15 DIAGNOSIS — I129 Hypertensive chronic kidney disease with stage 1 through stage 4 chronic kidney disease, or unspecified chronic kidney disease: Secondary | ICD-10-CM

## 2020-01-15 DIAGNOSIS — E7800 Pure hypercholesterolemia, unspecified: Secondary | ICD-10-CM | POA: Diagnosis not present

## 2020-01-15 DIAGNOSIS — N184 Chronic kidney disease, stage 4 (severe): Secondary | ICD-10-CM | POA: Diagnosis not present

## 2020-01-15 DIAGNOSIS — R6889 Other general symptoms and signs: Secondary | ICD-10-CM | POA: Diagnosis not present

## 2020-01-15 DIAGNOSIS — F4322 Adjustment disorder with anxiety: Secondary | ICD-10-CM

## 2020-01-15 DIAGNOSIS — I700 Atherosclerosis of aorta: Secondary | ICD-10-CM

## 2020-01-15 MED ORDER — CARVEDILOL 6.25 MG PO TABS: ORAL_TABLET | ORAL | 2 refills | Status: DC

## 2020-01-15 MED ORDER — OZEMPIC (0.25 OR 0.5 MG/DOSE) 2 MG/1.5ML ~~LOC~~ SOPN: PEN_INJECTOR | SUBCUTANEOUS | 1 refills | Status: DC

## 2020-01-15 MED ORDER — AMLODIPINE BESYLATE 10 MG PO TABS: ORAL_TABLET | ORAL | 1 refills | Status: DC

## 2020-01-15 MED ORDER — ROSUVASTATIN CALCIUM 10 MG PO TABS: 10.0000 mg | ORAL_TABLET | Freq: Every day | ORAL | 2 refills | Status: DC

## 2020-01-15 NOTE — Progress Notes (Signed)
I,[name],acting as a Education administrator for [name], MD.,have documented all relevant documentation on the behalf of [name], MD,as directed by  [name], MD while in the presence of [name], MD.  This visit occurred during the SARS-CoV-2 public health emergency.  Safety protocols were in place, including screening questions prior to the visit, additional usage of staff PPE, and extensive cleaning of exam room while observing appropriate contact time as indicated for disinfecting solutions.  Subjective:     Patient ID: Michelle Cummings , female    DOB: 07-May-1950 , 70 y.o.   MRN: [id number]   Chief Complaint  Patient presents with  . Diabetes  . Hypertension    HPI  The patient is here today for a diabetes/BP check.  She reports compliance with meds. Unfortunately, her husband has suffered a stroke since her last visit. She admits to feeling more anxious since this has happened. He is now in rehab facility in [place], close to her daughter.   Diabetes She presents for her follow-up diabetic visit. She has type 2 diabetes mellitus. Her disease course has been stable. There are no hypoglycemic associated symptoms. Pertinent negatives for diabetes include no blurred vision and no chest pain. There are no hypoglycemic complications. Diabetic complications include nephropathy. Risk factors for coronary artery disease include diabetes mellitus, dyslipidemia, hypertension and post-menopausal. She is following a diabetic diet. She participates in exercise three times a week. Her breakfast blood glucose is taken between 9-10 am. Her breakfast blood glucose range is generally 110-130 mg/dl. An ACE inhibitor/angiotensin II receptor blocker is being taken.  Hypertension This is a chronic problem. The current episode started more than 1 year ago. The problem has been gradually improving since onset. The problem is controlled. Pertinent negatives include no blurred vision or chest  pain. Past treatments include angiotensin blockers, diuretics and ACE inhibitors. The current treatment provides moderate improvement. Hypertensive end-organ damage includes kidney disease.     Past Medical History:  Diagnosis Date  . Arthritis   . Cataracts, bilateral   . Cervical disc disease   . Chronic kidney disease    Dr. Hillery Hunter yearly for CKD stage II as of 11/26/11  . Diabetes mellitus ([place])   . Eczema   . Glaucoma   . Heart murmur   . History of chicken pox   . Hyperlipidemia   . Hypertension   . Seasonal allergies   . Thyroid nodule      Family History  Problem Relation Age of Onset  . Hypertension Mother   . Early death Father   . Breast cancer Sister   . Healthy Brother      Current Outpatient Medications:  .  allopurinol (ZYLOPRIM) 100 MG tablet, TK 2 TS PO D, Disp: , Rfl:  .  blood glucose meter kit and supplies KIT, Dispense based on patient and insurance preference. Use up to four times daily to check blood sugars Dx code e11.65, Disp: 1 each, Rfl: 0 .  ferrous sulfate 325 (65 FE) MG tablet, Take 1 tablet (325 mg total) by mouth daily with breakfast., Disp: 30 tablet, Rfl: 3 .  latanoprost (XALATAN) 0.005 % ophthalmic solution, place 1 drop into both eyes at bedtime, Disp: , Rfl:  .  MITIGARE 0.6 MG CAPS, Take 1 capsule by mouth daily, Disp: 30 capsule, Rfl: 1 .  timolol (TIMOPTIC-XR) 0.5 % ophthalmic gel-forming, Place 1 drop into both eyes daily., Disp: , Rfl:  .  vitamin C (  ASCORBIC ACID) 500 MG tablet, Take 1,000 mg by mouth daily., Disp: , Rfl:  .  Vitamin D, Ergocalciferol, (DRISDOL) 1.25 MG (50000 UNIT) CAPS capsule, Take 1 capsule (50,000 Units total) by mouth every 7 (seven) days., Disp: 12 capsule, Rfl: 1 .  amLODipine (NORVASC) 10 MG tablet, TAKE 1 TABLET(10 MG) BY MOUTH DAILY, Disp: 90 tablet, Rfl: 1 .  carvedilol (COREG) 6.25 MG tablet, One tab po bid, Disp: 180 tablet, Rfl: 2 .  Cholecalciferol (VITAMIN D) 1000 UNITS capsule, Take 1,000  Units by mouth 2 (two) times daily. (Patient not taking: Reported on 01/15/2020), Disp: , Rfl:  .  omeprazole (PRILOSEC) 40 MG capsule, 40 mg. Monday, Wednesday, Friday (Patient not taking: No sig reported), Disp: , Rfl:  .  rosuvastatin (CRESTOR) 10 MG tablet, Take 1 tablet (10 mg total) by mouth daily. Monday, Wednesday, Friday, Disp: 36 tablet, Rfl: 2 .  Semaglutide,0.25 or 0.5MG/DOS, (OZEMPIC, 0.25 OR 0.5 MG/DOSE,) 2 MG/1.5ML SOPN, INJECT 0.5MG INTO THE SKIN ONCE A WEEK, Disp: 4.5 mL, Rfl: 1 .  valsartan-hydrochlorothiazide (DIOVAN-HCT) 80-12.5 MG tablet, TAKE 1 TABLET BY MOUTH DAILY, Disp: 90 tablet, Rfl: 2   No Known Allergies   Review of Systems  Constitutional: Negative.   HENT: Positive for congestion.   Eyes: Negative for blurred vision.  Respiratory: Negative.   Cardiovascular: Negative.  Negative for chest pain.  Gastrointestinal: Negative.   Psychiatric/Behavioral: Negative.   All other systems reviewed and are negative.    Today's Vitals   01/15/20 1413  BP: 122/70  Pulse: 70  Temp: (!) 97.5 F (36.4 C)  TempSrc: Oral  Weight: 145 lb 3.2 oz (65.9 kg)  Height: 5' 3.4" (1.61 m)  PainSc: 0-No pain   Body mass index is 25.4 kg/m.  Wt Readings from Last 3 Encounters:  01/15/20 145 lb 3.2 oz (65.9 kg)  11/23/19 158 lb 3.2 oz (71.8 kg)  10/19/19 161 lb 12.8 oz (73.4 kg)   Objective:  Physical Exam Vitals and nursing note reviewed.  Constitutional:      Appearance: Normal appearance. She is obese.  HENT:     Head: Normocephalic and atraumatic.  Cardiovascular:     Rate and Rhythm: Normal rate and regular rhythm.     Heart sounds: Normal heart sounds.  Pulmonary:     Breath sounds: Normal breath sounds.  Skin:    General: Skin is warm.  Neurological:     General: No focal deficit present.     Mental Status: She is alert and oriented to person, place, and time.         Assessment And Plan:     1. Type 2 diabetes mellitus with stage 4 chronic kidney  disease, without long-term current use of insulin (HCC) Comments: Chronic, I willcheck labs as listed below. She is encouraged to continue to exercise regularly.  - Hemoglobin A1c - CMP14+EGFR  2. Hypertensive nephropathy Comments: Chronic, well controlled.  She will continue with valsartan/hctz, carvedilol. She is encouraged to follow low sodium diet.   3. Pure hypercholesterolemia Comments: Chronic. She is encouraged to c/w rosuvastatin w/ MWF dosing. She is unable to tolerate daily dosing due to myalgias.   4. Abnormal ankle brachial index (ABI) Comments: She had positive PAD screen w/ home health visit. She agrees to Vascular referral for ABIs.  - VAS [place] ABI WITH/WO TBI; Future  5. Aortic atherosclerosis (HCC) Comments: Chronic. She is encouraged to follow heart healthy diet. Advised to comply with statin therapy.   6. Adjustment  disorder with anxiety Comments: She is now overwhelmed with her husband's care and current state of health. She would like referral for therapy.  - Ambulatory referral to Psychology  Patient was given opportunity to ask questions. Patient verbalized understanding of the plan and was able to repeat key elements of the plan. All questions were answered to their satisfaction.  [name], MD   I, [name], MD, have reviewed all documentation for this visit. The documentation on 01/26/20 for the exam, diagnosis, procedures, and orders are all accurate and complete.  THE PATIENT IS ENCOURAGED TO PRACTICE SOCIAL DISTANCING DUE TO THE COVID-19 PANDEMIC.

## 2020-01-15 NOTE — Patient Instructions (Signed)
Diabetes Mellitus and Foot Care Foot care is an important part of your health, especially when you have diabetes. Diabetes may cause you to have problems because of poor blood flow (circulation) to your feet and legs, which can cause your skin to:  Become thinner and drier.  Break more easily.  Heal more slowly.  Peel and crack. You may also have nerve damage (neuropathy) in your legs and feet, causing decreased feeling in them. This means that you may not notice minor injuries to your feet that could lead to more serious problems. Noticing and addressing any potential problems early is the best way to prevent future foot problems. How to care for your feet Foot hygiene  Wash your feet daily with warm water and mild soap. Do not use hot water. Then, pat your feet and the areas between your toes until they are completely dry. Do not soak your feet as this can dry your skin.  Trim your toenails straight across. Do not dig under them or around the cuticle. File the edges of your nails with an emery board or nail file.  Apply a moisturizing lotion or petroleum jelly to the skin on your feet and to dry, brittle toenails. Use lotion that does not contain alcohol and is unscented. Do not apply lotion between your toes.   Shoes and socks  Wear clean socks or stockings every day. Make sure they are not too tight. Do not wear knee-high stockings since they may decrease blood flow to your legs.  Wear shoes that fit properly and have enough cushioning. Always look in your shoes before you put them on to be sure there are no objects inside.  To break in new shoes, wear them for just a few hours a day. This prevents injuries on your feet. Wounds, scrapes, corns, and calluses  Check your feet daily for blisters, cuts, bruises, sores, and redness. If you cannot see the bottom of your feet, use a mirror or ask someone for help.  Do not cut corns or calluses or try to remove them with medicine.  If you  find a minor scrape, cut, or break in the skin on your feet, keep it and the skin around it clean and dry. You may clean these areas with mild soap and water. Do not clean the area with peroxide, alcohol, or iodine.  If you have a wound, scrape, corn, or callus on your foot, look at it several times a day to make sure it is healing and not infected. Check for: ? Redness, swelling, or pain. ? Fluid or blood. ? Warmth. ? Pus or a bad smell.   General tips  Do not cross your legs. This may decrease blood flow to your feet.  Do not use heating pads or hot water bottles on your feet. They may burn your skin. If you have lost feeling in your feet or legs, you may not know this is happening until it is too late.  Protect your feet from hot and cold by wearing shoes, such as at the beach or on hot pavement.  Schedule a complete foot exam at least once a year (annually) or more often if you have foot problems. Report any cuts, sores, or bruises to your health care provider immediately. Where to find more information  American Diabetes Association: www.diabetes.org  Association of Diabetes Care & Education Specialists: www.diabeteseducator.org Contact a health care provider if:  You have a medical condition that increases your risk of infection and   you have any cuts, sores, or bruises on your feet.  You have an injury that is not healing.  You have redness on your legs or feet.  You feel burning or tingling in your legs or feet.  You have pain or cramps in your legs and feet.  Your legs or feet are numb.  Your feet always feel cold.  You have pain around any toenails. Get help right away if:  You have a wound, scrape, corn, or callus on your foot and: ? You have pain, swelling, or redness that gets worse. ? You have fluid or blood coming from the wound, scrape, corn, or callus. ? Your wound, scrape, corn, or callus feels warm to the touch. ? You have pus or a bad smell coming from  the wound, scrape, corn, or callus. ? You have a fever. ? You have a red line going up your leg. Summary  Check your feet every day for blisters, cuts, bruises, sores, and redness.  Apply a moisturizing lotion or petroleum jelly to the skin on your feet and to dry, brittle toenails.  Wear shoes that fit properly and have enough cushioning.  If you have foot problems, report any cuts, sores, or bruises to your health care provider immediately.  Schedule a complete foot exam at least once a year (annually) or more often if you have foot problems. This information is not intended to replace advice given to you by your health care provider. Make sure you discuss any questions you have with your health care provider. Document Revised: 07/13/2019 Document Reviewed: 07/13/2019 Elsevier Patient Education  2021 Elsevier Inc.  

## 2020-01-16 LAB — CMP14+EGFR
ALT: 19 IU/L (ref 0–32)
AST: 18 IU/L (ref 0–40)
Albumin/Globulin Ratio: 1.7 (ref 1.2–2.2)
Albumin: 4.4 g/dL (ref 3.8–4.8)
Alkaline Phosphatase: 106 IU/L (ref 44–121)
BUN/Creatinine Ratio: 23 (ref 12–28)
BUN: 51 mg/dL — ABNORMAL HIGH (ref 8–27)
Bilirubin Total: 0.3 mg/dL (ref 0.0–1.2)
CO2: 19 mmol/L — ABNORMAL LOW (ref 20–29)
Calcium: 9.4 mg/dL (ref 8.7–10.3)
Chloride: 97 mmol/L (ref 96–106)
Creatinine, Ser: 2.18 mg/dL — ABNORMAL HIGH (ref 0.57–1.00)
GFR calc Af Amer: 26 mL/min/{1.73_m2} — ABNORMAL LOW (ref >59)
GFR calc non Af Amer: 22 mL/min/{1.73_m2} — ABNORMAL LOW (ref >59)
Globulin, Total: 2.6 g/dL (ref 1.5–4.5)
Glucose: 94 mg/dL (ref 65–99)
Potassium: 4.5 mmol/L (ref 3.5–5.2)
Sodium: 130 mmol/L — ABNORMAL LOW (ref 134–144)
Total Protein: 7 g/dL (ref 6.0–8.5)

## 2020-01-16 LAB — HEMOGLOBIN A1C
Est. average glucose Bld gHb Est-mCnc: 131 mg/dL
Hgb A1c MFr Bld: 6.2 % — ABNORMAL HIGH (ref 4.8–5.6)

## 2020-01-17 ENCOUNTER — Ambulatory Visit (HOSPITAL_COMMUNITY)
Admission: RE | Admit: 2020-01-17 | Discharge: 2020-01-17 | Disposition: A | Discharge status: Home / Self Care | Payer: Medicare PPO | Source: Ambulatory Visit | Attending: Internal Medicine | Admitting: Internal Medicine

## 2020-01-17 ENCOUNTER — Other Ambulatory Visit: Payer: Self-pay

## 2020-01-17 DIAGNOSIS — R6889 Other general symptoms and signs: Secondary | ICD-10-CM | POA: Insufficient documentation

## 2020-01-24 ENCOUNTER — Other Ambulatory Visit: Payer: Self-pay | Admitting: Internal Medicine

## 2020-01-26 DIAGNOSIS — F4322 Adjustment disorder with anxiety: Secondary | ICD-10-CM | POA: Insufficient documentation

## 2020-01-26 DIAGNOSIS — R6889 Other general symptoms and signs: Secondary | ICD-10-CM | POA: Insufficient documentation

## 2020-01-26 DIAGNOSIS — I129 Hypertensive chronic kidney disease with stage 1 through stage 4 chronic kidney disease, or unspecified chronic kidney disease: Secondary | ICD-10-CM | POA: Insufficient documentation

## 2020-02-01 ENCOUNTER — Other Ambulatory Visit: Payer: Self-pay

## 2020-02-01 MED ORDER — ACCU-CHEK GUIDE VI STRP: ORAL_STRIP | 2 refills | Status: DC

## 2020-02-01 MED ORDER — ACCU-CHEK SOFTCLIX LANCETS MISC: 2 refills | Status: DC

## 2020-02-09 ENCOUNTER — Telehealth: Payer: Self-pay

## 2020-02-09 NOTE — Telephone Encounter (Addendum)
Per wife [name]  Blood sugar readings   02/08/20- 247 8:00 02/08/20- 67 12:00 02/08/20- 132 8:00 p.m  02/09/20- 232 8:00

## 2020-02-10 NOTE — Telephone Encounter (Signed)
Please increase PM insulin dose by 3 units, pls document new dose in chart.

## 2020-02-12 ENCOUNTER — Telehealth: Payer: Self-pay

## 2020-02-12 NOTE — Telephone Encounter (Signed)
Per RS Please increase PM insulin dose by 3 units, pls document new dose in chart. LVM

## 2020-02-15 ENCOUNTER — Ambulatory Visit: Payer: Medicare PPO | Admitting: Sports Medicine

## 2020-02-15 ENCOUNTER — Other Ambulatory Visit: Payer: Self-pay

## 2020-02-15 ENCOUNTER — Encounter: Payer: Self-pay | Admitting: Sports Medicine

## 2020-02-15 DIAGNOSIS — M79674 Pain in right toe(s): Secondary | ICD-10-CM

## 2020-02-15 DIAGNOSIS — E119 Type 2 diabetes mellitus without complications: Secondary | ICD-10-CM

## 2020-02-15 DIAGNOSIS — B351 Tinea unguium: Secondary | ICD-10-CM | POA: Diagnosis not present

## 2020-02-15 DIAGNOSIS — M79675 Pain in left toe(s): Secondary | ICD-10-CM | POA: Diagnosis not present

## 2020-02-15 DIAGNOSIS — L84 Corns and callosities: Secondary | ICD-10-CM

## 2020-02-15 NOTE — Progress Notes (Signed)
Subjective: Michelle Cummings is a 70 y.o. female patient with history of diabetes who presents to office today complaining of long,mildly painful nails and callus while ambulating in shoes; unable to trim. Reports that diabetic shoes help/ Patient states that the glucose reading this morning was not recorded but last A1c was 6.2.  Patient denies any new changes in medication or new problems.   Review of systems noncontributory. Patient Active Problem List   Diagnosis Date Noted  . Hypertensive nephropathy 01/26/2020  . Abnormal ankle brachial index (ABI) 01/26/2020  . Adjustment disorder with anxiety 01/26/2020  . Aortic atherosclerosis ([place]) 02/01/2018  . Gout 07/02/2016  . Anemia of chronic disease 06/07/2016  . HLD (hyperlipidemia) 06/02/2016  . Glaucoma 06/02/2016  . Chronic kidney disease, stage 3, mod decreased GFR (HCC) 06/02/2016  . HTN (hypertension) 03/05/2011  . Type 2 diabetes mellitus with stage 3 chronic kidney disease ([place]) 03/05/2011   Current Outpatient Medications on File Prior to Visit  Medication Sig Dispense Refill  . ACCU-CHEK GUIDE test strip USE AS DIRECTED TO CHECK BLOOD SUGARS 2 TIMES A DAY DX: E11.22 100 each 2  . Accu-Chek Softclix Lancets lancets USE AS DIRECTED TO CHECK BLOOD SUGARS 2 TIMES A DAY DX: E11.22 100 each 2  . allopurinol (ZYLOPRIM) 100 MG tablet TK 2 TS PO D    . amLODipine (NORVASC) 10 MG tablet TAKE 1 TABLET(10 MG) BY MOUTH DAILY 90 tablet 1  . blood glucose meter kit and supplies KIT Dispense based on patient and insurance preference. Use up to four times daily to check blood sugars Dx code e11.65 1 each 0  . carvedilol (COREG) 6.25 MG tablet One tab po bid 180 tablet 2  . Cholecalciferol (VITAMIN D) 1000 UNITS capsule Take 1,000 Units by mouth 2 (two) times daily. (Patient not taking: Reported on 01/15/2020)    . ferrous sulfate 325 (65 FE) MG tablet Take 1 tablet (325 mg total) by mouth daily with breakfast. 30 tablet 3  . latanoprost  (XALATAN) 0.005 % ophthalmic solution place 1 drop into both eyes at bedtime    . MITIGARE 0.6 MG CAPS Take 1 capsule by mouth daily 30 capsule 1  . omeprazole (PRILOSEC) 40 MG capsule 40 mg. Monday, Wednesday, Friday (Patient not taking: No sig reported)    . rosuvastatin (CRESTOR) 10 MG tablet Take 1 tablet (10 mg total) by mouth daily. Monday, Wednesday, Friday 36 tablet 2  . Semaglutide,0.25 or 0.5MG/DOS, (OZEMPIC, 0.25 OR 0.5 MG/DOSE,) 2 MG/1.5ML SOPN INJECT 0.5MG INTO THE SKIN ONCE A WEEK 4.5 mL 1  . timolol (TIMOPTIC-XR) 0.5 % ophthalmic gel-forming Place 1 drop into both eyes daily.    . valsartan-hydrochlorothiazide (DIOVAN-HCT) 80-12.5 MG tablet TAKE 1 TABLET BY MOUTH DAILY 90 tablet 2  . vitamin C (ASCORBIC ACID) 500 MG tablet Take 1,000 mg by mouth daily.    . Vitamin D, Ergocalciferol, (DRISDOL) 1.25 MG (50000 UNIT) CAPS capsule Take 1 capsule (50,000 Units total) by mouth every 7 (seven) days. 12 capsule 1   No current facility-administered medications on file prior to visit.   No Known Allergies  Recent Results (from the past 2160 hour(s))  Hemoglobin A1c     Status: Abnormal   Collection Time: 01/15/20  3:04 PM  Result Value Ref Range   Hgb A1c MFr Bld 6.2 (H) 4.8 - 5.6 %    Comment:          Prediabetes: 5.7 - 6.4  Diabetes: >6.4          Glycemic control for adults with diabetes: <7.0    Est. average glucose Bld gHb Est-mCnc 131 mg/dL  CMP14+EGFR     Status: Abnormal   Collection Time: 01/15/20  3:04 PM  Result Value Ref Range   Glucose 94 65 - 99 mg/dL   BUN 51 (H) 8 - 27 mg/dL   Creatinine, Ser 2.18 (H) 0.57 - 1.00 mg/dL   GFR calc non Af Amer 22 (L) >59 mL/min/1.73   GFR calc Af Amer 26 (L) >59 mL/min/1.73    Comment: **In accordance with recommendations from the NKF-ASN Task force,**   [facility] is in the process of updating its eGFR calculation to the   2021 CKD-EPI creatinine equation that estimates kidney function   without a race variable.     BUN/Creatinine Ratio 23 12 - 28   Sodium 130 (L) 134 - 144 mmol/L   Potassium 4.5 3.5 - 5.2 mmol/L   Chloride 97 96 - 106 mmol/L   CO2 19 (L) 20 - 29 mmol/L   Calcium 9.4 8.7 - 10.3 mg/dL   Total Protein 7.0 6.0 - 8.5 g/dL   Albumin 4.4 3.8 - 4.8 g/dL   Globulin, Total 2.6 1.5 - 4.5 g/dL   Albumin/Globulin Ratio 1.7 1.2 - 2.2   Bilirubin Total 0.3 0.0 - 1.2 mg/dL   Alkaline Phosphatase 106 44 - 121 IU/L    Comment:               **Please note reference interval change**   AST 18 0 - 40 IU/L   ALT 19 0 - 32 IU/L    Objective: General: Patient is awake, alert, and oriented x 3 and in no acute distress.  Integument: Skin is warm, dry and supple bilateral. Nails are tender, long, thickened and dystrophic with minimal subungual debris, consistent with onychomycosis, 1-5 bilateral. No signs of infection.  Callus submet 5 present bilateral. Remaining integument unremarkable.  Vasculature:  Dorsalis Pedis pulse 2/4 bilateral. Posterior Tibial pulse 1/4 bilateral.  Capillary fill time <3 sec 1-5 bilateral. Positive hair growth to the level of the digits. Temperature gradient within normal limits. No varicosities present bilateral. No edema present bilateral.   Neurology: The patient has intact sensation measured with a 5.07/10g Semmes Weinstein Monofilament at all pedal sites bilateral . Vibratory sensation intact bilateral with tuning fork. No Babinski sign present bilateral.   Musculoskeletal: Asymptomatic hammertoe and pes planus and prominent fifth metatarsal head at area of callus with minimal metatarsalgia pedal deformities noted bilateral. Muscular strength 5/5 in all lower extremity muscular groups bilateral without pain on range of motion . No tenderness with calf compression bilateral.  Assessment and Plan: Problem List Items Addressed This Visit   None   Visit Diagnoses    Pain due to onychomycosis of toenails of both feet    -  Primary   Diabetes mellitus without complication  (HCC)       Callus          -Examined patient. -Discussed and educated patient on diabetic foot care, especially with regards to the vascular, neurological and musculoskeletal systems.  -Mechanically debrided all nails 1-5 bilateral using sterile nail nipper and filed with dremel without incident  -Mechanically debrided callus using a sterile chisel blade bilateral submet back without incident -Continue with diabetic shoes -Answered all patient questions -Patient to return  in 3 months for at risk foot care  Landis Martins, DPM

## 2020-02-19 ENCOUNTER — Telehealth: Payer: Self-pay

## 2020-02-19 NOTE — Telephone Encounter (Signed)
RS: Please increase PM insulin dose by 3 units, pls document new dose in chart LVM x2 for pt to call the office regarding message from [facility]

## 2020-02-22 ENCOUNTER — Ambulatory Visit: Payer: Medicare PPO | Admitting: Internal Medicine

## 2020-02-23 ENCOUNTER — Other Ambulatory Visit: Payer: Self-pay | Admitting: Internal Medicine

## 2020-02-23 DIAGNOSIS — Z1231 Encounter for screening mammogram for malignant neoplasm of breast: Secondary | ICD-10-CM

## 2020-03-06 ENCOUNTER — Telehealth: Payer: Self-pay

## 2020-03-06 NOTE — Telephone Encounter (Signed)
Per RS check pt b/p  142/68  Per RS: okay, let her know bp elevated, get pill cutter and cut amlodipine in half. pls confirm dose put in for nurse visit in two weeks for bp check.     Dose '10mg'$ , appt scheduled

## 2020-03-19 ENCOUNTER — Other Ambulatory Visit: Payer: Self-pay

## 2020-03-19 ENCOUNTER — Ambulatory Visit: Payer: Medicare PPO

## 2020-03-19 VITALS — BP 138/72 | HR 71 | Temp 98.1°F | Ht 63.0 in | Wt 145.8 lb

## 2020-03-19 DIAGNOSIS — I129 Hypertensive chronic kidney disease with stage 1 through stage 4 chronic kidney disease, or unspecified chronic kidney disease: Secondary | ICD-10-CM

## 2020-03-19 NOTE — Progress Notes (Signed)
Pt is here for a BP check. She has not eaten yet, but she did taker her medications at 9 this morning. Amlodipine'10mg'$  & allopurinol. She does exercise 3-4 days out of the week, walking and leg exercises. She also drinks plenty of water. Provider stated to continue to take Provider stated for pt to continue taking medication as directed, and to try some stress relief exercises.  BP Readings from Last 3 Encounters:  03/19/20 138/72  01/15/20 122/70  11/23/19 122/60

## 2020-03-28 ENCOUNTER — Other Ambulatory Visit: Payer: Self-pay | Admitting: Internal Medicine

## 2020-04-03 DIAGNOSIS — H2513 Age-related nuclear cataract, bilateral: Secondary | ICD-10-CM | POA: Diagnosis not present

## 2020-04-03 DIAGNOSIS — H401131 Primary open-angle glaucoma, bilateral, mild stage: Secondary | ICD-10-CM | POA: Diagnosis not present

## 2020-04-09 ENCOUNTER — Other Ambulatory Visit: Payer: Self-pay

## 2020-04-09 ENCOUNTER — Ambulatory Visit: Payer: Medicare PPO | Admitting: Internal Medicine

## 2020-04-09 ENCOUNTER — Encounter: Payer: Self-pay | Admitting: Internal Medicine

## 2020-04-09 VITALS — BP 148/66 | HR 68 | Temp 97.9°F | Ht 63.0 in | Wt 144.8 lb

## 2020-04-09 DIAGNOSIS — I129 Hypertensive chronic kidney disease with stage 1 through stage 4 chronic kidney disease, or unspecified chronic kidney disease: Secondary | ICD-10-CM | POA: Diagnosis not present

## 2020-04-09 DIAGNOSIS — R300 Dysuria: Secondary | ICD-10-CM | POA: Diagnosis not present

## 2020-04-09 DIAGNOSIS — N184 Chronic kidney disease, stage 4 (severe): Secondary | ICD-10-CM | POA: Diagnosis not present

## 2020-04-09 DIAGNOSIS — E7800 Pure hypercholesterolemia, unspecified: Secondary | ICD-10-CM | POA: Diagnosis not present

## 2020-04-09 DIAGNOSIS — E1122 Type 2 diabetes mellitus with diabetic chronic kidney disease: Secondary | ICD-10-CM | POA: Diagnosis not present

## 2020-04-09 DIAGNOSIS — I700 Atherosclerosis of aorta: Secondary | ICD-10-CM | POA: Diagnosis not present

## 2020-04-09 LAB — POCT URINALYSIS DIPSTICK
Bilirubin, UA: NEGATIVE
Blood, UA: NEGATIVE
Glucose, UA: NEGATIVE
Ketones, UA: NEGATIVE
Nitrite, UA: NEGATIVE
Protein, UA: POSITIVE — AB
Spec Grav, UA: 1.01 (ref 1.010–1.025)
Urobilinogen, UA: 0.2 E.U./dL
pH, UA: 5.5 (ref 5.0–8.0)

## 2020-04-09 MED ORDER — VITAMIN D (ERGOCALCIFEROL) 1.25 MG (50000 UNIT) PO CAPS: 1.0000 | ORAL_CAPSULE | ORAL | 1 refills | Status: DC

## 2020-04-09 NOTE — Patient Instructions (Signed)

## 2020-04-09 NOTE — Progress Notes (Signed)
I,[name],acting as a Education administrator for [name], MD.,have documented all relevant documentation on the behalf of [name], MD,as directed by  [name], MD while in the presence of [name], MD.  This visit occurred during the SARS-CoV-2 public health emergency.  Safety protocols were in place, including screening questions prior to the visit, additional usage of staff PPE, and extensive cleaning of exam room while observing appropriate contact time as indicated for disinfecting solutions.  Subjective:     Patient ID: Michelle Cummings , female    DOB: 07/09/1950 , 70 y.o.   MRN: [id number]   Chief Complaint  Patient presents with  . Diabetes  . Hypertension    HPI  The patient is here today for a diabetes and blood pressure f/u.  She reports compliance with meds. Thinks her BP may be elevated because she rushed to her appointment. Denies headaches, chest pain and shortness of breath. She is now caring for her husband who has had a stroke w/ resultant weakness. She is happy to report she has some outside help as well.   Diabetes She presents for her follow-up diabetic visit. She has type 2 diabetes mellitus. Her disease course has been stable. There are no hypoglycemic associated symptoms. Pertinent negatives for diabetes include no blurred vision and no chest pain. There are no hypoglycemic complications. Diabetic complications include nephropathy. Risk factors for coronary artery disease include diabetes mellitus, dyslipidemia, hypertension and post-menopausal. She is following a diabetic diet. She participates in exercise three times a week. Her breakfast blood glucose is taken between 9-10 am. Her breakfast blood glucose range is generally 110-130 mg/dl. An ACE inhibitor/angiotensin II receptor blocker is being taken.  Hypertension This is a chronic problem. The current episode started more than 1 year ago. The problem has been gradually improving since onset. The  problem is controlled. Pertinent negatives include no blurred vision or chest pain. Past treatments include angiotensin blockers, diuretics and ACE inhibitors. The current treatment provides moderate improvement. Hypertensive end-organ damage includes kidney disease.     Past Medical History:  Diagnosis Date  . Arthritis   . Cataracts, bilateral   . Cervical disc disease   . Chronic kidney disease    Dr. Hillery Hunter yearly for CKD stage II as of 11/26/11  . Diabetes mellitus ([place])   . Eczema   . Glaucoma   . Heart murmur   . History of chicken pox   . Hyperlipidemia   . Hypertension   . Seasonal allergies   . Thyroid nodule      Family History  Problem Relation Age of Onset  . Hypertension Mother   . Early death Father   . Breast cancer Sister   . Healthy Brother      Current Outpatient Medications:  .  ACCU-CHEK GUIDE test strip, USE AS DIRECTED TO CHECK BLOOD SUGARS 2 TIMES A DAY DX: E11.22, Disp: 100 each, Rfl: 2 .  Accu-Chek Softclix Lancets lancets, USE AS DIRECTED TO CHECK BLOOD SUGARS 2 TIMES A DAY DX: E11.22, Disp: 100 each, Rfl: 2 .  allopurinol (ZYLOPRIM) 100 MG tablet, TK 2 TS PO D, Disp: , Rfl:  .  amLODipine (NORVASC) 10 MG tablet, TAKE 1 TABLET(10 MG) BY MOUTH DAILY, Disp: 90 tablet, Rfl: 1 .  carvedilol (COREG) 6.25 MG tablet, One tab po bid, Disp: 180 tablet, Rfl: 2 .  ferrous sulfate 325 (65 FE) MG tablet, Take 1 tablet (325 mg total) by mouth daily with  breakfast., Disp: 30 tablet, Rfl: 3 .  latanoprost (XALATAN) 0.005 % ophthalmic solution, place 1 drop into both eyes at bedtime, Disp: , Rfl:  .  MITIGARE 0.6 MG CAPS, Take 1 capsule by mouth daily, Disp: 30 capsule, Rfl: 1 .  rosuvastatin (CRESTOR) 10 MG tablet, Take 1 tablet (10 mg total) by mouth daily. Monday, Wednesday, Friday, Disp: 36 tablet, Rfl: 2 .  Semaglutide,0.25 or 0.5MG/DOS, (OZEMPIC, 0.25 OR 0.5 MG/DOSE,) 2 MG/1.5ML SOPN, INJECT 0.5MG INTO THE SKIN ONCE A WEEK, Disp: 4.5 mL, Rfl: 1 .  timolol  (TIMOPTIC-XR) 0.5 % ophthalmic gel-forming, Place 1 drop into both eyes daily., Disp: , Rfl:  .  valsartan-hydrochlorothiazide (DIOVAN-HCT) 80-12.5 MG tablet, TAKE 1 TABLET BY MOUTH DAILY, Disp: 90 tablet, Rfl: 2 .  vitamin C (ASCORBIC ACID) 500 MG tablet, Take 1,000 mg by mouth daily., Disp: , Rfl:  .  atorvastatin (LIPITOR) 40 MG tablet, TAKE 1 TABLET BY MOUTH ON MONDAY- FRIDAY AS DIRECTED, Disp: 64 tablet, Rfl: 3 .  Vitamin D, Ergocalciferol, (DRISDOL) 1.25 MG (50000 UNIT) CAPS capsule, Take 1 capsule (50,000 Units total) by mouth every 7 (seven) days., Disp: 12 capsule, Rfl: 1   No Known Allergies   Review of Systems  Constitutional: Negative.   Eyes: Negative for blurred vision.  Respiratory: Negative.   Cardiovascular: Negative.  Negative for chest pain.  Gastrointestinal: Negative.   Genitourinary: Positive for dysuria.  Psychiatric/Behavioral: Negative.   All other systems reviewed and are negative.    Today's Vitals   04/09/20 1043  BP: (!) 148/66  Pulse: 68  Temp: 97.9 F (36.6 C)  TempSrc: Oral  Weight: 144 lb 12.8 oz (65.7 kg)  Height: _0  (1.6 m)  PainSc: 0-No pain   Body mass index is 25.65 kg/m.  Wt Readings from Last 3 Encounters:  04/09/20 144 lb 12.8 oz (65.7 kg)  03/19/20 145 lb 12.8 oz (66.1 kg)  01/15/20 145 lb 3.2 oz (65.9 kg)   Objective:  Physical Exam Vitals and nursing note reviewed.  Constitutional:      Appearance: Normal appearance.  HENT:     Head: Normocephalic and atraumatic.     Nose:     Comments: Masked     Mouth/Throat:     Comments: Masked  Cardiovascular:     Rate and Rhythm: Normal rate and regular rhythm.     Heart sounds: Normal heart sounds.  Pulmonary:     Effort: Pulmonary effort is normal.     Breath sounds: Normal breath sounds.  Skin:    General: Skin is warm.  Neurological:     General: No focal deficit present.     Mental Status: She is alert.  Psychiatric:        Mood and Affect: Mood normal.         Behavior: Behavior normal.         Assessment And Plan:     1. Type 2 diabetes mellitus with stage 4 chronic kidney disease, without long-term current use of insulin (HCC) Comments: Chronic, I will check labs as listed below. I will check Renal labs as well. She would like CCM referral, states they gave her husband good information.  - AMB Referral to [place] - Protein electrophoresis, serum - Phosphorus - Parathyroid Hormone, Intact w/Ca - Lipid panel - Hemoglobin A1c - CMP14+EGFR  2. Hypertensive nephropathy Comments: Uncontrolled. I will not make any changes to her meds at this time.  Encouraged to incorporate more exercise into her  daily routine.  - AMB Referral to [facility]  3. Atherosclerosis of aorta ([place]) Comments: She is encouraged to follow heart healthy lifestyle - regular exercise, clean diet and increase fiber intake.  - Lipid panel  4. Pure hypercholesterolemia Comments: Please see #3.  - Lipid panel  5. Dysuria Comments: I will check urinalysis and treat accordingly.  - POCT Urinalysis Dipstick (24199)    Patient was given opportunity to ask questions. Patient verbalized understanding of the plan and was able to repeat key elements of the plan. All questions were answered to their satisfaction.    I, [name], MD, have reviewed all documentation for this visit. The documentation on 04/09/20 for the exam, diagnosis, procedures, and orders are all accurate and complete.   IF YOU HAVE BEEN REFERRED TO A SPECIALIST, IT MAY TAKE 1-2 WEEKS TO SCHEDULE/PROCESS THE REFERRAL. IF YOU HAVE NOT HEARD FROM US/SPECIALIST IN TWO WEEKS, PLEASE GIVE [place] A CALL AT [phone].   THE PATIENT IS ENCOURAGED TO PRACTICE SOCIAL DISTANCING DUE TO THE COVID-19 PANDEMIC.

## 2020-04-11 ENCOUNTER — Telehealth: Payer: Self-pay | Admitting: *Deleted

## 2020-04-11 LAB — LIPID PANEL
Chol/HDL Ratio: 3 ratio (ref 0.0–4.4)
Cholesterol, Total: 219 mg/dL — ABNORMAL HIGH (ref 100–199)
HDL: 73 mg/dL (ref >39)
LDL Chol Calc (NIH): 130 mg/dL — ABNORMAL HIGH (ref 0–99)
Triglycerides: 93 mg/dL (ref 0–149)
VLDL Cholesterol Cal: 16 mg/dL (ref 5–40)

## 2020-04-11 LAB — PHOSPHORUS: Phosphorus: 4.1 mg/dL (ref 3.0–4.3)

## 2020-04-11 LAB — CMP14+EGFR
ALT: 22 IU/L (ref 0–32)
AST: 17 IU/L (ref 0–40)
Albumin/Globulin Ratio: 1.5 (ref 1.2–2.2)
Albumin: 4.5 g/dL (ref 3.7–4.7)
Alkaline Phosphatase: 101 IU/L (ref 44–121)
BUN/Creatinine Ratio: 28 (ref 12–28)
BUN: 62 mg/dL — ABNORMAL HIGH (ref 8–27)
Bilirubin Total: <0.2 mg/dL (ref 0.0–1.2)
CO2: 20 mmol/L (ref 20–29)
Calcium: 9.7 mg/dL (ref 8.7–10.3)
Chloride: 100 mmol/L (ref 96–106)
Creatinine, Ser: 2.21 mg/dL — ABNORMAL HIGH (ref 0.57–1.00)
Globulin, Total: 3 g/dL (ref 1.5–4.5)
Glucose: 92 mg/dL (ref 65–99)
Potassium: 5.2 mmol/L (ref 3.5–5.2)
Sodium: 135 mmol/L (ref 134–144)
Total Protein: 7.5 g/dL (ref 6.0–8.5)
eGFR: 23 mL/min/{1.73_m2} — ABNORMAL LOW (ref >59)

## 2020-04-11 LAB — PROTEIN ELECTROPHORESIS, SERUM
A/G Ratio: 1.2 (ref 0.7–1.7)
Albumin ELP: 4.1 g/dL (ref 2.9–4.4)
Alpha 1: 0.2 g/dL (ref 0.0–0.4)
Alpha 2: 0.9 g/dL (ref 0.4–1.0)
Beta: 1.1 g/dL (ref 0.7–1.3)
Gamma Globulin: 1.2 g/dL (ref 0.4–1.8)
Globulin, Total: 3.4 g/dL (ref 2.2–3.9)

## 2020-04-11 LAB — PTH, INTACT AND CALCIUM: PTH: 88 pg/mL — ABNORMAL HIGH (ref 15–65)

## 2020-04-11 LAB — HEMOGLOBIN A1C
Est. average glucose Bld gHb Est-mCnc: 140 mg/dL
Hgb A1c MFr Bld: 6.5 % — ABNORMAL HIGH (ref 4.8–5.6)

## 2020-04-11 NOTE — Chronic Care Management (AMB) (Signed)
Chronic Care Management   Note  04/11/2020 Name: Michelle Cummings MRN: [id number] DOB: May 25, 1950  Michelle Cummings is a 70 y.o. year old female who is a primary care patient of Dorothyann Peng, MD. I reached out to [name] by phone today in response to a referral sent by [name]'s PCP, Dr. Allyne Gee.     [name] was given information about Chronic Care Management services today including:  1. CCM service includes personalized support from designated clinical staff supervised by her physician, including individualized plan of care and coordination with other care providers 2. 24/7 contact phone numbers for assistance for urgent and routine care needs. 3. Service will only be billed when office clinical staff spend 20 minutes or more in a month to coordinate care. 4. Only one practitioner may furnish and bill the service in a calendar month. 5. The patient may stop CCM services at any time (effective at the end of the month) by phone call to the office staff. 6. The patient will be responsible for cost sharing (co-pay) of up to 20% of the service fee (after annual deductible is met).  Patient agreed to services and verbal consent obtained.   Follow up plan: Telephone appointment with care management team member scheduled for:04/23/2020  [facility] Guide, Embedded Care Coordination [facility] Management

## 2020-04-15 ENCOUNTER — Inpatient Hospital Stay: Admission: RE | Admit: 2020-04-15 | Payer: Medicare PPO | Source: Ambulatory Visit

## 2020-04-16 ENCOUNTER — Ambulatory Visit
Admission: RE | Admit: 2020-04-16 | Discharge: 2020-04-16 | Disposition: A | Discharge status: Home / Self Care | Payer: Medicare PPO | Source: Ambulatory Visit | Attending: Internal Medicine | Admitting: Internal Medicine

## 2020-04-16 ENCOUNTER — Other Ambulatory Visit: Payer: Self-pay

## 2020-04-16 DIAGNOSIS — Z1231 Encounter for screening mammogram for malignant neoplasm of breast: Secondary | ICD-10-CM | POA: Diagnosis not present

## 2020-04-18 ENCOUNTER — Other Ambulatory Visit: Payer: Self-pay

## 2020-04-18 ENCOUNTER — Other Ambulatory Visit: Payer: Self-pay | Admitting: Internal Medicine

## 2020-04-18 MED ORDER — ATORVASTATIN CALCIUM 40 MG PO TABS: ORAL_TABLET | ORAL | 1 refills | Status: DC

## 2020-04-23 ENCOUNTER — Ambulatory Visit (INDEPENDENT_AMBULATORY_CARE_PROVIDER_SITE_OTHER): Payer: Medicare PPO

## 2020-04-23 ENCOUNTER — Other Ambulatory Visit: Payer: Self-pay | Admitting: Internal Medicine

## 2020-04-23 ENCOUNTER — Telehealth: Payer: Medicare PPO

## 2020-04-23 ENCOUNTER — Ambulatory Visit: Payer: Self-pay

## 2020-04-23 DIAGNOSIS — E1122 Type 2 diabetes mellitus with diabetic chronic kidney disease: Secondary | ICD-10-CM

## 2020-04-23 DIAGNOSIS — N184 Chronic kidney disease, stage 4 (severe): Secondary | ICD-10-CM

## 2020-04-23 DIAGNOSIS — I129 Hypertensive chronic kidney disease with stage 1 through stage 4 chronic kidney disease, or unspecified chronic kidney disease: Secondary | ICD-10-CM

## 2020-04-23 DIAGNOSIS — M10342 Gout due to renal impairment, left hand: Secondary | ICD-10-CM

## 2020-04-23 NOTE — Chronic Care Management (AMB) (Signed)
Chronic Care Management   CCM RN Visit Note  04/23/2020 Name: Michelle Cummings MRN: [id number] DOB: 04/20/1950  Subjective: Michelle Cummings is a 70 y.o. year old female who is a primary care patient of Glendale Chard, MD. The care management team was consulted for assistance with disease management and care coordination needs.    Collaboration with Daneen Schick BSW for Case Collaboration  in response to provider referral for case management and/or care coordination services.   Consent to Services:  The patient was given information about Chronic Care Management services, agreed to services, and gave verbal consent prior to initiation of services.  Please see initial visit note for detailed documentation.   Patient agreed to services and verbal consent obtained.   Assessment: Review of patient past medical history, allergies, medications, health status, including review of consultants reports, laboratory and other test data, was performed as part of comprehensive evaluation and provision of chronic care management services.   SDOH (Social Determinants of Health) assessments and interventions performed:  No  CCM Care Plan  No Known Allergies  Outpatient Encounter Medications as of 04/23/2020  Medication Sig  . ACCU-CHEK GUIDE test strip USE AS DIRECTED TO CHECK BLOOD SUGARS 2 TIMES A DAY DX: E11.22  . Accu-Chek Softclix Lancets lancets USE AS DIRECTED TO CHECK BLOOD SUGARS 2 TIMES A DAY DX: E11.22  . allopurinol (ZYLOPRIM) 100 MG tablet TK 2 TS PO D  . amLODipine (NORVASC) 10 MG tablet TAKE 1 TABLET(10 MG) BY MOUTH DAILY  . atorvastatin (LIPITOR) 40 MG tablet TAKE 1 TABLET BY MOUTH ON MONDAY- FRIDAY AS DIRECTED  . carvedilol (COREG) 6.25 MG tablet One tab po bid  . ferrous sulfate 325 (65 FE) MG tablet Take 1 tablet (325 mg total) by mouth daily with breakfast.  . latanoprost (XALATAN) 0.005 % ophthalmic solution place 1 drop into both eyes at bedtime  . MITIGARE 0.6 MG CAPS Take 1 capsule  by mouth daily  . rosuvastatin (CRESTOR) 10 MG tablet Take 1 tablet (10 mg total) by mouth daily. Monday, Wednesday, Friday  . Semaglutide,0.25 or 0.'5MG'$ /DOS, (OZEMPIC, 0.25 OR 0.5 MG/DOSE,) 2 MG/1.5ML SOPN INJECT 0.'5MG'$  INTO THE SKIN ONCE A WEEK  . timolol (TIMOPTIC-XR) 0.5 % ophthalmic gel-forming Place 1 drop into both eyes daily.  . valsartan-hydrochlorothiazide (DIOVAN-HCT) 80-12.5 MG tablet TAKE 1 TABLET BY MOUTH DAILY  . vitamin C (ASCORBIC ACID) 500 MG tablet Take 1,000 mg by mouth daily.  . Vitamin D, Ergocalciferol, (DRISDOL) 1.25 MG (50000 UNIT) CAPS capsule Take 1 capsule (50,000 Units total) by mouth every 7 (seven) days.   No facility-administered encounter medications on file as of 04/23/2020.    Patient Active Problem List   Diagnosis Date Noted  . Hypertensive nephropathy 01/26/2020  . Abnormal ankle brachial index (ABI) 01/26/2020  . Adjustment disorder with anxiety 01/26/2020  . Aortic atherosclerosis ([place]) 02/01/2018  . Gout 07/02/2016  . Anemia of chronic disease 06/07/2016  . HLD (hyperlipidemia) 06/02/2016  . Glaucoma 06/02/2016  . Chronic kidney disease, stage 3, mod decreased GFR (HCC) 06/02/2016  . HTN (hypertension) 03/05/2011  . Type 2 diabetes mellitus with stage 3 chronic kidney disease ([place]) 03/05/2011    Conditions to be addressed/monitored:Type II diabetes mellitus with stage 4 chronic kidney disease, Hypertensive nephropathy  Care Plan : Assist with Chronic Care Management and Care Cooridnation needs  Updates made by Lynne Logan, RN since 04/23/2020 12:00 AM    Problem: Assist with Chronic Care Management and Care Coordination needs  Priority: High    Long-Range Goal: Assist with Chronic Care Management and Care Coordination needs   Start Date: 04/23/2020  Expected End Date: 06/24/2020  This Visit's Progress: On track  Priority: High  Note:   Current Barriers:  Marland Kitchen Knowledge Barriers related to resources and support available to address needs  related to Type II diabetes mellitus with stage 4 chronic kidney disease, Hypertensive nephropathy and Caregiver resources.  Case Manager Clinical Goal(s):  Marland Kitchen Over the next 60 days, patient will work with the CCM team to address needs related to chronic care management for Type II diabetes mellitus with stage 4 chronic kidney disease, Hypertensive nephropathy and Caregiver resources. Interventions:  . Collaborated with  embedded BSW Daneen Schick  to establish an individualized plan of care.  Patient Self Care Activities:  . Patient will work with the CCM team to address care coordination needs and will continue to work with the clinical team to address health care and disease management related needs.    Initial RN CM Call scheduled for: 05/28/20     Plan:Telephone follow up appointment with care management team member scheduled for:  05/28/20  Barb Merino, RN, BSN, CCM Care Management Coordinator [place] [facility]  Direct Phone: [phone]

## 2020-04-23 NOTE — Chronic Care Management (AMB) (Signed)
Chronic Care Management    Social Work Note  04/23/2020 Name: Michelle Cummings MRN: [id number] DOB: 1950-04-10  Michelle Cummings is a 70 y.o. year old female who is a primary care patient of Glendale Chard, MD. The CCM team was consulted to assist the patient with chronic disease management and/or care coordination needs related to: Caregiver Stress.   Engaged with patient by telephone for initial visit in response to provider referral for social work chronic care management and care coordination services.   Consent to Services:  The patient was given the following information about Chronic Care Management services today, agreed to services, and gave verbal consent: 1. CCM service includes personalized support from designated clinical staff supervised by the primary care provider, including individualized plan of care and coordination with other care providers 2. 24/7 contact phone numbers for assistance for urgent and routine care needs. 3. Service will only be billed when office clinical staff spend 20 minutes or more in a month to coordinate care. 4. Only one practitioner may furnish and bill the service in a calendar month. 5.The patient may stop CCM services at any time (effective at the end of the month) by phone call to the office staff. 6. The patient will be responsible for cost sharing (co-pay) of up to 20% of the service fee (after annual deductible is met). Patient agreed to services and consent obtained.  Patient agreed to services and consent obtained.   Assessment: Review of patient past medical history, allergies, medications, and health status, including review of relevant consultants reports was performed today as part of a comprehensive evaluation and provision of chronic care management and care coordination services.     SDOH (Social Determinants of Health) assessments and interventions performed:  SDOH Interventions   Flowsheet Row Most Recent Value  SDOH Interventions   Food  Insecurity Interventions Intervention Not Indicated  Housing Interventions Intervention Not Indicated  Transportation Interventions Intervention Not Indicated       Advanced Directives Status: Not addressed in this encounter.  CCM Care Plan  No Known Allergies  Outpatient Encounter Medications as of 04/23/2020  Medication Sig  . ACCU-CHEK GUIDE test strip USE AS DIRECTED TO CHECK BLOOD SUGARS 2 TIMES A DAY DX: E11.22  . Accu-Chek Softclix Lancets lancets USE AS DIRECTED TO CHECK BLOOD SUGARS 2 TIMES A DAY DX: E11.22  . allopurinol (ZYLOPRIM) 100 MG tablet TK 2 TS PO D  . amLODipine (NORVASC) 10 MG tablet TAKE 1 TABLET(10 MG) BY MOUTH DAILY  . atorvastatin (LIPITOR) 40 MG tablet TAKE 1 TABLET BY MOUTH ON MONDAY- FRIDAY AS DIRECTED  . carvedilol (COREG) 6.25 MG tablet One tab po bid  . ferrous sulfate 325 (65 FE) MG tablet Take 1 tablet (325 mg total) by mouth daily with breakfast.  . latanoprost (XALATAN) 0.005 % ophthalmic solution place 1 drop into both eyes at bedtime  . MITIGARE 0.6 MG CAPS Take 1 capsule by mouth daily  . rosuvastatin (CRESTOR) 10 MG tablet Take 1 tablet (10 mg total) by mouth daily. Monday, Wednesday, Friday  . Semaglutide,0.25 or 0.5MG/DOS, (OZEMPIC, 0.25 OR 0.5 MG/DOSE,) 2 MG/1.5ML SOPN INJECT 0.5MG INTO THE SKIN ONCE A WEEK  . timolol (TIMOPTIC-XR) 0.5 % ophthalmic gel-forming Place 1 drop into both eyes daily.  . valsartan-hydrochlorothiazide (DIOVAN-HCT) 80-12.5 MG tablet TAKE 1 TABLET BY MOUTH DAILY  . vitamin C (ASCORBIC ACID) 500 MG tablet Take 1,000 mg by mouth daily.  . Vitamin D, Ergocalciferol, (DRISDOL) 1.25 MG (50000 UNIT) CAPS  capsule Take 1 capsule (50,000 Units total) by mouth every 7 (seven) days.   No facility-administered encounter medications on file as of 04/23/2020.    Patient Active Problem List   Diagnosis Date Noted  . Hypertensive nephropathy 01/26/2020  . Abnormal ankle brachial index (ABI) 01/26/2020  . Adjustment disorder with  anxiety 01/26/2020  . Aortic atherosclerosis ([place]) 02/01/2018  . Gout 07/02/2016  . Anemia of chronic disease 06/07/2016  . HLD (hyperlipidemia) 06/02/2016  . Glaucoma 06/02/2016  . Chronic kidney disease, stage 3, mod decreased GFR (HCC) 06/02/2016  . HTN (hypertension) 03/05/2011  . Type 2 diabetes mellitus with stage 3 chronic kidney disease ([place]) 03/05/2011    Conditions to be addressed/monitored: DMII and CKD Stage IV; Caregiver Stress  Care Plan : Social Work [facility] Care Plan  Updates made by Daneen Schick since 04/23/2020 12:00 AM    Problem: Caregiver Stress     Long-Range Goal: Caregiver Coping Optimized   Start Date: 04/23/2020  Expected End Date: 08/21/2020  Priority: High  Note:   Current Barriers:  . Chronic disease management support and education needs related to DM and CKD Stage IV   . Limited knowledge of caregiver resources  Social Worker Clinical Goal(s):  Marland Kitchen Over the next 120 days, patient will work with SW to identify and address any acute and/or chronic care coordination needs related to the self health management of DM and CKD Stage IV   . Over the next 120 days the patient will work with SW to identify caregiver respite resources  CCM SW Interventions:  . Inter-disciplinary care team collaboration (see longitudinal plan of care) . Collaboration with Glendale Chard, MD regarding development and update of comprehensive plan of care as evidenced by provider attestation and co-signature . Successful outbound call placed to the patient to assess for SDoH needs . Discussed the patient is the primary caregiver for her spouse who had a stroke a few months ago and needs assistance performing all ADL's in the home . Determined the patient is interested in working with SW to identify respite resources . Provided verbal education on [facility] [place] . Unsuccessful call placed to the [facility] to complete referral - voice message  left for [name] requesting a return call . Discussed plan for SW to refer the patients spouse to the [facility] in home aid program - referral successfully placed . Collaboration with [place] regarding interventions and plan . Scheduled follow up call over the next 30 days  Patient Goals/Self-Care Activities . Over the next 30 days, patient will:   - Patient will self administer medications as prescribed Patient will attend all scheduled provider appointments Engage with [facility] [place] as needed regarding [place] as needed prior to next scheduled call  Follow Up Plan:  SW will follow up with the patient over the next month. SW will Theatre manager over the next 10 days if a return call is not received       Follow Up Plan: SW will follow up with patient by phone over the next month      Daneen Schick, BSW, CDP Social Worker, Certified Dementia Practitioner [place] [facility] [place] [facility] [phone]  Total time spent performing care coordination and/or care management activities with the patient by phone or face to face = 42 minutes.

## 2020-04-23 NOTE — Patient Instructions (Signed)
Social Worker Visit Information  Goals we discussed today:  Goals Addressed            This Visit's Progress   . Caregiver Coping Optimized       Timeframe:  Long-Range Goal Priority:  High Start Date:    4.19.22                         Expected End Date: 8.17.22                      Next planned outreach: 5.10.22  Patient Goals/Self-Care Activities . Over the next 30 days, patient will:   - Patient will self administer medications as prescribed Patient will attend all scheduled provider appointments Engage with [facility] [place] as needed regarding Caregiver Respite Program Contact SW as needed prior to next scheduled call        Materials provided: Verbal education about caregiver resources provided by phone  Ms. Weldon was given information about Chronic Care Management services today including:  1. CCM service includes personalized support from designated clinical staff supervised by her physician, including individualized plan of care and coordination with other care providers 2. 24/7 contact phone numbers for assistance for urgent and routine care needs. 3. Service will only be billed when office clinical staff spend 20 minutes or more in a month to coordinate care. 4. Only one practitioner may furnish and bill the service in a calendar month. 5. The patient may stop CCM services at any time (effective at the end of the month) by phone call to the office staff. 6. The patient will be responsible for cost sharing (co-pay) of up to 20% of the service fee (after annual deductible is met).  Patient agreed to services and verbal consent obtained.   Patient verbalizes understanding of instructions provided today and agrees to view in [place].   Follow up plan: SW will follow up with patient by phone over the next month  Daneen Schick, BSW, CDP Social Worker, Certified Dementia Practitioner [place] [facility] [place] [facility] [phone]

## 2020-04-24 ENCOUNTER — Other Ambulatory Visit: Payer: Self-pay | Admitting: Internal Medicine

## 2020-04-26 ENCOUNTER — Other Ambulatory Visit: Payer: Self-pay

## 2020-04-26 MED ORDER — MITIGARE 0.6 MG PO CAPS: ORAL_CAPSULE | ORAL | 0 refills | Status: DC

## 2020-05-03 ENCOUNTER — Ambulatory Visit: Payer: Medicare PPO

## 2020-05-03 DIAGNOSIS — N184 Chronic kidney disease, stage 4 (severe): Secondary | ICD-10-CM

## 2020-05-03 DIAGNOSIS — I129 Hypertensive chronic kidney disease with stage 1 through stage 4 chronic kidney disease, or unspecified chronic kidney disease: Secondary | ICD-10-CM | POA: Diagnosis not present

## 2020-05-03 DIAGNOSIS — E1122 Type 2 diabetes mellitus with diabetic chronic kidney disease: Secondary | ICD-10-CM

## 2020-05-03 NOTE — Chronic Care Management (AMB) (Signed)
Chronic Care Management    Social Work Note  05/03/2020 Name: Michelle Cummings MRN: [id number] DOB: [birth year]  Michelle Cummings is a 70 y.o. year old female who is a primary care patient of Michelle Chard, MD. The CCM team was consulted to assist the patient with chronic disease management and/or care coordination needs related to: Caregiver Stress.   Collaboration with Michelle Cummings from [employer] [facility] [place] for program referral in response to provider referral for social work chronic care management and care coordination services.   Consent to Services:  The patient was given information about Chronic Care Management services, agreed to services, and gave verbal consent prior to initiation of services.  Please see initial visit note for detailed documentation.   Patient agreed to services and consent obtained.   Assessment: Review of patient past medical history, allergies, medications, and health status, including review of relevant consultants reports was performed today as part of a comprehensive evaluation and provision of chronic care management and care coordination services.     SDOH (Social Determinants of Health) assessments and interventions performed:    Advanced Directives Status: Not addressed in this encounter.  CCM Care Plan  No Known Allergies  Outpatient Encounter Medications as of 05/03/2020  Medication Sig  . ACCU-CHEK GUIDE test strip USE AS DIRECTED TO CHECK BLOOD SUGARS 2 TIMES A DAY DX: E11.22  . Accu-Chek Softclix Lancets lancets USE AS DIRECTED TO CHECK BLOOD SUGARS 2 TIMES A DAY DX: E11.22  . allopurinol (ZYLOPRIM) 100 MG tablet TK 2 TS PO D  . amLODipine (NORVASC) 10 MG tablet TAKE 1 TABLET(10 MG) BY MOUTH DAILY  . atorvastatin (LIPITOR) 40 MG tablet TAKE 1 TABLET BY MOUTH ON MONDAY- FRIDAY AS DIRECTED  . carvedilol (COREG) 6.25 MG tablet TAKE 1 TABLET BY MOUTH TWICE DAILY  . ferrous sulfate 325 (65 FE) MG tablet Take 1 tablet (325 mg total) by mouth  daily with breakfast.  . latanoprost (XALATAN) 0.005 % ophthalmic solution place 1 drop into both eyes at bedtime  . MITIGARE 0.6 MG CAPS Take 1 capsule by mouth daily  . rosuvastatin (CRESTOR) 10 MG tablet TAKE 1 TABLET(10 MG) BY MOUTH DAILY  . Semaglutide,0.25 or 0.'5MG'$ /DOS, (OZEMPIC, 0.25 OR 0.5 MG/DOSE,) 2 MG/1.5ML SOPN INJECT 0.'5MG'$  INTO THE SKIN ONCE A WEEK  . timolol (TIMOPTIC-XR) 0.5 % ophthalmic gel-forming Place 1 drop into both eyes daily.  . valsartan-hydrochlorothiazide (DIOVAN-HCT) 80-12.5 MG tablet TAKE 1 TABLET BY MOUTH DAILY  . vitamin C (ASCORBIC ACID) 500 MG tablet Take 1,000 mg by mouth daily.  . Vitamin D, Ergocalciferol, (DRISDOL) 1.25 MG (50000 UNIT) CAPS capsule Take 1 capsule (50,000 Units total) by mouth every 7 (seven) days.   No facility-administered encounter medications on file as of 05/03/2020.    Patient Active Problem List   Diagnosis Date Noted  . Hypertensive nephropathy 01/26/2020  . Abnormal ankle brachial index (ABI) 01/26/2020  . Adjustment disorder with anxiety 01/26/2020  . Aortic atherosclerosis ([place]) 02/01/2018  . Gout 07/02/2016  . Anemia of chronic disease 06/07/2016  . HLD (hyperlipidemia) 06/02/2016  . Glaucoma 06/02/2016  . Chronic kidney disease, stage 3, mod decreased GFR (HCC) 06/02/2016  . HTN (hypertension) 03/05/2011  . Type 2 diabetes mellitus with stage 3 chronic kidney disease ([place]) 03/05/2011    Conditions to be addressed/monitored: DMII and CKD Stage IV; Caregiver Stress  Care Plan : Social Work [facility] Care Plan  Updates made by Michelle Cummings since 05/03/2020 12:00 AM    Problem:  Caregiver Stress     Long-Range Goal: Caregiver Coping Optimized   Start Date: 04/23/2020  Expected End Date: 08/21/2020  This Visit's Progress: On track  Priority: High  Note:   Current Barriers:  . Chronic disease management support and education needs related to DM and CKD Stage IV   . Limited knowledge of caregiver resources  Social Worker  Clinical Goal(s):  Marland Kitchen Over the next 120 days, patient will work with SW to identify and address any acute and/or chronic care coordination needs related to the self health management of DM and CKD Stage IV   . Over the next 120 days the patient will work with SW to identify caregiver respite resources  CCM SW Interventions:  . Inter-disciplinary care team collaboration (see longitudinal plan of care) . Collaboration with Michelle Chard, MD regarding development and update of comprehensive plan of care as evidenced by provider attestation and co-signature . Successful outbound call placed to the patient to assess for SDoH needs . Discussed the patient is the primary caregiver for her spouse who had a stroke a few months ago and needs assistance performing all ADL's in the home . Determined the patient is interested in working with SW to identify respite resources . Provided verbal education on [facility] [place] . Unsuccessful call placed to the [facility] to complete referral - voice message left for Michelle Cummings requesting a return call . Discussed plan for SW to refer the patients spouse to the [facility] in home aid program - referral successfully placed . Collaboration with [place] regarding interventions and plan . Scheduled follow up call over the next 30 days 4.29.22 . Referral placed to Michelle Cummings with [facility] [place] for Caregiver Respite Program Patient Goals/Self-Care Activities . Over the next 30 days, patient will:   - Patient will self administer medications as prescribed Patient will attend all scheduled provider appointments Engage with [facility] [place] as needed regarding [place] as needed prior to next scheduled call  Follow Up Plan:  SW will follow up with the patient over the next month.        Follow Up Plan: SW will follow up with patient by phone  over the next 14 days      Michelle Cummings, BSW, CDP Social Worker, Certified Dementia Practitioner [place] [facility] [place] [facility] [phone]  Total time spent performing care coordination and/or care management activities with the patient by phone or face to face = 10 minutes.

## 2020-05-14 ENCOUNTER — Telehealth: Payer: Medicare PPO

## 2020-05-16 ENCOUNTER — Ambulatory Visit: Payer: Medicare PPO | Admitting: Internal Medicine

## 2020-05-16 ENCOUNTER — Ambulatory Visit: Payer: Medicare PPO | Admitting: Sports Medicine

## 2020-05-21 ENCOUNTER — Ambulatory Visit: Payer: Medicare PPO

## 2020-05-21 DIAGNOSIS — I129 Hypertensive chronic kidney disease with stage 1 through stage 4 chronic kidney disease, or unspecified chronic kidney disease: Secondary | ICD-10-CM

## 2020-05-21 DIAGNOSIS — E1122 Type 2 diabetes mellitus with diabetic chronic kidney disease: Secondary | ICD-10-CM

## 2020-05-21 NOTE — Chronic Care Management (AMB) (Signed)
Chronic Care Management    Social Work Note  05/21/2020 Name: Michelle Cummings MRN: [id number] DOB: 1950-10-18  Michelle Cummings is a 70 y.o. year old female who is a primary care patient of Glendale Chard, MD. The CCM team was consulted to assist the patient with chronic disease management and/or care coordination needs related to: Caregiver Stress.   Engaged with patient by telephone for follow up visit in response to provider referral for social work chronic care management and care coordination services.   Consent to Services:  The patient was given information about Chronic Care Management services, agreed to services, and gave verbal consent prior to initiation of services.  Please see initial visit note for detailed documentation.   Patient agreed to services and consent obtained.   Assessment: Review of patient past medical history, allergies, medications, and health status, including review of relevant consultants reports was performed today as part of a comprehensive evaluation and provision of chronic care management and care coordination services.     SDOH (Social Determinants of Health) assessments and interventions performed:    Advanced Directives Status: Not addressed in this encounter.  CCM Care Plan  No Known Allergies  Outpatient Encounter Medications as of 05/21/2020  Medication Sig  . ACCU-CHEK GUIDE test strip USE AS DIRECTED TO CHECK BLOOD SUGARS 2 TIMES A DAY DX: E11.22  . Accu-Chek Softclix Lancets lancets USE AS DIRECTED TO CHECK BLOOD SUGARS 2 TIMES A DAY DX: E11.22  . allopurinol (ZYLOPRIM) 100 MG tablet TK 2 TS PO D  . amLODipine (NORVASC) 10 MG tablet TAKE 1 TABLET(10 MG) BY MOUTH DAILY  . atorvastatin (LIPITOR) 40 MG tablet TAKE 1 TABLET BY MOUTH ON MONDAY- FRIDAY AS DIRECTED  . carvedilol (COREG) 6.25 MG tablet TAKE 1 TABLET BY MOUTH TWICE DAILY  . ferrous sulfate 325 (65 FE) MG tablet Take 1 tablet (325 mg total) by mouth daily with breakfast.  . latanoprost  (XALATAN) 0.005 % ophthalmic solution place 1 drop into both eyes at bedtime  . MITIGARE 0.6 MG CAPS Take 1 capsule by mouth daily  . rosuvastatin (CRESTOR) 10 MG tablet TAKE 1 TABLET(10 MG) BY MOUTH DAILY  . Semaglutide,0.25 or 0.'5MG'$ /DOS, (OZEMPIC, 0.25 OR 0.5 MG/DOSE,) 2 MG/1.5ML SOPN INJECT 0.'5MG'$  INTO THE SKIN ONCE A WEEK  . timolol (TIMOPTIC-XR) 0.5 % ophthalmic gel-forming Place 1 drop into both eyes daily.  . valsartan-hydrochlorothiazide (DIOVAN-HCT) 80-12.5 MG tablet TAKE 1 TABLET BY MOUTH DAILY  . vitamin C (ASCORBIC ACID) 500 MG tablet Take 1,000 mg by mouth daily.  . Vitamin D, Ergocalciferol, (DRISDOL) 1.25 MG (50000 UNIT) CAPS capsule Take 1 capsule (50,000 Units total) by mouth every 7 (seven) days.   No facility-administered encounter medications on file as of 05/21/2020.    Patient Active Problem List   Diagnosis Date Noted  . Hypertensive nephropathy 01/26/2020  . Abnormal ankle brachial index (ABI) 01/26/2020  . Adjustment disorder with anxiety 01/26/2020  . Aortic atherosclerosis ([place]) 02/01/2018  . Gout 07/02/2016  . Anemia of chronic disease 06/07/2016  . HLD (hyperlipidemia) 06/02/2016  . Glaucoma 06/02/2016  . Chronic kidney disease, stage 3, mod decreased GFR (HCC) 06/02/2016  . HTN (hypertension) 03/05/2011  . Type 2 diabetes mellitus with stage 3 chronic kidney disease ([place]) 03/05/2011    Conditions to be addressed/monitored: DMII and CKD Stage IV; Caregiver Stress  Care Plan : Social Work [place]  Updates made by Daneen Schick since 05/21/2020 12:00 AM    Problem: Caregiver Stress  Long-Range Goal: Caregiver Coping Optimized   Start Date: 04/23/2020  Expected End Date: 08/21/2020  Recent Progress: On track  Priority: High  Note:   Current Barriers:  . Chronic disease management support and education needs related to DM and CKD Stage IV   . Limited knowledge of caregiver resources  Social Worker Clinical Goal(s):  Marland Kitchen Over the next 120 days,  patient will work with SW to identify and address any acute and/or chronic care coordination needs related to the self health management of DM and CKD Stage IV   . Over the next 120 days the patient will work with SW to identify caregiver respite resources  CCM SW Interventions:  . Inter-disciplinary care team collaboration (see longitudinal plan of care) . Collaboration with Glendale Chard, MD regarding development and update of comprehensive plan of care as evidenced by provider attestation and co-signature . Successful outbound call placed to the patient to assess goal progression . Determined the patient has yet to be contacted by [employer] [facility] [employer] . Collaboration with Francene Finders to request contact with Edd Fabian from the Caregiver Respite program to confirm receipt of referral . Scheduled follow up over the next month  Patient Goals/Self-Care Activities . Over the next 30 days, patient will:   - Patient will self administer medications as prescribed Patient will attend all scheduled provider appointments Engage with [facility] as needed regarding [place] as needed prior to next scheduled call  Follow Up Plan:  SW will follow up with the patient over the next month.        Follow Up Plan: SW will follow up with patient by phone over the next month      Daneen Schick, BSW, CDP Social Worker, Certified Dementia Practitioner [place] [facility] [place] [facility] [phone]  Total time spent performing care coordination and/or care management activities with the patient by phone or face to face = 18 minutes.

## 2020-05-21 NOTE — Patient Instructions (Signed)
Social Worker Visit Information  Goals we discussed today:  Goals Addressed            This Visit's Progress   . Caregiver Coping Optimized       Timeframe:  Long-Range Goal Priority:  High Start Date:    4.19.22                         Expected End Date: 8.17.22                      Next planned outreach: 6.17.22  Patient Goals/Self-Care Activities . Over the next 30 days, patient will:   - Patient will self administer medications as prescribed Patient will attend all scheduled provider appointments Engage with [facility] as needed regarding [place] as needed prior to next scheduled call        Follow Up Plan: SW will follow up with patient by phone over the next month   Daneen Schick, BSW, CDP Social Worker, Certified Dementia Practitioner [place] [facility] [place] [facility] [phone]

## 2020-05-22 ENCOUNTER — Ambulatory Visit: Payer: Medicare PPO | Admitting: Internal Medicine

## 2020-05-22 ENCOUNTER — Encounter: Payer: Self-pay | Admitting: Internal Medicine

## 2020-05-22 ENCOUNTER — Other Ambulatory Visit: Payer: Self-pay

## 2020-05-22 VITALS — BP 134/66 | HR 63 | Temp 98.2°F | Ht 63.0 in | Wt 146.0 lb

## 2020-05-22 DIAGNOSIS — M79675 Pain in left toe(s): Secondary | ICD-10-CM | POA: Insufficient documentation

## 2020-05-22 DIAGNOSIS — I700 Atherosclerosis of aorta: Secondary | ICD-10-CM

## 2020-05-22 DIAGNOSIS — M7661 Achilles tendinitis, right leg: Secondary | ICD-10-CM | POA: Diagnosis not present

## 2020-05-22 DIAGNOSIS — E7800 Pure hypercholesterolemia, unspecified: Secondary | ICD-10-CM | POA: Diagnosis not present

## 2020-05-22 NOTE — Progress Notes (Signed)
I,Michelle Cummings,acting as a Education administrator for Michelle Greenland, MD.,have documented all relevant documentation on the behalf of Michelle Greenland, MD,as directed by  Michelle Greenland, MD while in the presence of Michelle Greenland, MD.  This visit occurred during the SARS-CoV-2 public health emergency.  Safety protocols were in place, including screening questions prior to the visit, additional usage of staff PPE, and extensive cleaning of exam room while observing appropriate contact time as indicated for disinfecting solutions.  Subjective:     Patient ID: Michelle Cummings , female    DOB: Jan 09, 1950 , 70 y.o.   MRN: [id number]   Chief Complaint  Patient presents with  . Hyperlipidemia    HPI  The patient is here today for a cholesterol follow-up.  She was started on atorvastatin '40mg'$  at her last visit. She took the medication one day and then developed palpitations. She was too scared to take anymore. She has since gone back to rosuvastatin '10mg'$  daily. She admits that she did not call the office to notify anyone of her symptoms.   Hyperlipidemia This is a chronic problem. The current episode started more than 1 year ago. The problem is uncontrolled. Exacerbating diseases include chronic renal disease and diabetes. Current antihyperlipidemic treatment includes statins. The current treatment provides mild improvement of lipids. Risk factors for coronary artery disease include diabetes mellitus, dyslipidemia and post-menopausal.     Past Medical History:  Diagnosis Date  . Arthritis   . Cataracts, bilateral   . Cervical disc disease   . Chronic kidney disease    Dr. Hillery Hunter yearly for CKD stage II as of 11/26/11  . Diabetes mellitus (Michelle Cummings)   . Eczema   . Glaucoma   . Heart murmur   . History of chicken pox   . Hyperlipidemia   . Hypertension   . Seasonal allergies   . Thyroid nodule      Family History  Problem Relation Age of Onset  . Hypertension Mother   . Early death Father   .  Breast cancer Sister   . Healthy Brother      Current Outpatient Medications:  .  ACCU-CHEK GUIDE test strip, USE AS DIRECTED TO CHECK BLOOD SUGARS 2 TIMES A DAY DX: E11.22, Disp: 100 each, Rfl: 2 .  Accu-Chek Softclix Lancets lancets, USE AS DIRECTED TO CHECK BLOOD SUGARS 2 TIMES A DAY DX: E11.22, Disp: 100 each, Rfl: 2 .  allopurinol (ZYLOPRIM) 100 MG tablet, TK 2 TS PO D, Disp: , Rfl:  .  amLODipine (NORVASC) 10 MG tablet, TAKE 1 TABLET(10 MG) BY MOUTH DAILY, Disp: 90 tablet, Rfl: 1 .  carvedilol (COREG) 6.25 MG tablet, TAKE 1 TABLET BY MOUTH TWICE DAILY, Disp: 180 tablet, Rfl: 2 .  ferrous sulfate 325 (65 FE) MG tablet, Take 1 tablet (325 mg total) by mouth daily with breakfast., Disp: 30 tablet, Rfl: 3 .  latanoprost (XALATAN) 0.005 % ophthalmic solution, place 1 drop into both eyes at bedtime, Disp: , Rfl:  .  MITIGARE 0.6 MG CAPS, Take 1 capsule by mouth daily, Disp: 90 capsule, Rfl: 0 .  rosuvastatin (CRESTOR) 10 MG tablet, TAKE 1 TABLET(10 MG) BY MOUTH DAILY, Disp: 90 tablet, Rfl: 1 .  Semaglutide,0.25 or 0.'5MG'$ /DOS, (OZEMPIC, 0.25 OR 0.5 MG/DOSE,) 2 MG/1.5ML SOPN, INJECT 0.'5MG'$  INTO THE SKIN ONCE A WEEK, Disp: 4.5 mL, Rfl: 1 .  timolol (TIMOPTIC-XR) 0.5 % ophthalmic gel-forming, Place 1 drop into both eyes daily., Disp: , Rfl:  .  valsartan-hydrochlorothiazide (DIOVAN-HCT)  80-12.5 MG tablet, TAKE 1 TABLET BY MOUTH DAILY, Disp: 90 tablet, Rfl: 2 .  vitamin C (ASCORBIC ACID) 500 MG tablet, Take 1,000 mg by mouth daily., Disp: , Rfl:  .  Vitamin D, Ergocalciferol, (DRISDOL) 1.25 MG (50000 UNIT) CAPS capsule, Take 1 capsule (50,000 Units total) by mouth every 7 (seven) days., Disp: 12 capsule, Rfl: 1   No Known Allergies   Review of Systems  Constitutional: Negative.   Respiratory: Negative.   Cardiovascular: Negative.   Gastrointestinal: Negative.   Musculoskeletal: Positive for arthralgias.       She c/o right heel pain. Denies fall/trauma. Walks regularly for exercise. Not sure  what may have triggered her sx. Does not stretch after walking.  Also having left great toe pain. 1/10- not sure what triggered sx. Wants to know if this is gout. Did not have any trouble putting on shoes.   Psychiatric/Behavioral: Negative.   All other systems reviewed and are negative.    Today's Vitals   05/22/20 1130  BP: 134/66  Pulse: 63  Temp: 98.2 F (36.8 C)  TempSrc: Oral  Weight: 146 lb (66.2 kg)  Height: '5\' 3"'$  (1.6 m)  PainSc: 5   PainLoc: Foot   Body mass index is 25.86 kg/m.  Wt Readings from Last 3 Encounters:  05/22/20 146 lb (66.2 kg)  04/09/20 144 lb 12.8 oz (65.7 kg)  03/19/20 145 lb 12.8 oz (66.1 kg)   BP Readings from Last 3 Encounters:  05/22/20 134/66  04/09/20 (!) 148/66  03/19/20 138/72   Objective:  Physical Exam Vitals and nursing note reviewed.  Constitutional:      Appearance: Normal appearance.  HENT:     Head: Normocephalic and atraumatic.     Nose:     Comments: Masked     Mouth/Throat:     Comments: Masked  Cardiovascular:     Rate and Rhythm: Normal rate and regular rhythm.     Heart sounds: Normal heart sounds.  Pulmonary:     Effort: Pulmonary effort is normal.     Breath sounds: Normal breath sounds.  Musculoskeletal:     Comments: r achilles tendon is tender to touch  Skin:    General: Skin is warm.  Neurological:     General: No focal deficit present.     Mental Status: She is alert.  Psychiatric:        Mood and Affect: Mood normal.        Behavior: Behavior normal.         Assessment And Plan:     1. Pure hypercholesterolemia Comments: She did not tolerate atorvastatin '40mg'$  due to palpitations. She agrees to start 1/2 tab ('20mg'$ ) daily M-F. I will check lipid panel at her next visit.   2. Atherosclerosis of aorta ([place]) Comments: Encouraged to follow heart healthy diet. Advised to take statin as prescribed, limit fried food intake and to increase fiber intake.   3. Achilles tendinitis of right lower  extremity Comments: Advised to apply Voltaren gel to affected area twice daily prn. She was also given stretching exercises to perform daily. Will refer to Podiatry if needed.   4. Great toe pain, left Comments: I do not think this is gout. She will let me know if her sx persist.      Patient was given opportunity to ask questions. Patient verbalized understanding of the plan and was able to repeat key elements of the plan. All questions were answered to their satisfaction.   I,  Michelle Greenland, MD, have reviewed all documentation for this visit. The documentation on 05/22/20 for the exam, diagnosis, procedures, and orders are all accurate and complete.   IF YOU HAVE BEEN REFERRED TO A SPECIALIST, IT MAY TAKE 1-2 WEEKS TO SCHEDULE/PROCESS THE REFERRAL. IF YOU HAVE NOT HEARD FROM US/SPECIALIST IN TWO WEEKS, PLEASE GIVE [place] A CALL AT [phone].   THE PATIENT IS ENCOURAGED TO PRACTICE SOCIAL DISTANCING DUE TO THE COVID-19 PANDEMIC.

## 2020-05-22 NOTE — Patient Instructions (Signed)
High Cholesterol  High cholesterol is a condition in which the blood has high levels of a See, waxy substance similar to fat (cholesterol). The liver makes all the cholesterol that the body needs. The human body needs small amounts of cholesterol to help build cells. A person gets extra or excess cholesterol from the food that he or she eats. The blood carries cholesterol from the liver to the rest of the body. If you have high cholesterol, deposits (plaques) may build up on the walls of your arteries. Arteries are the blood vessels that carry blood away from your heart. These plaques make the arteries narrow and stiff. Cholesterol plaques increase your risk for heart attack and stroke. Work with your health care provider to keep your cholesterol levels in a healthy range. What increases the risk? The following factors may make you more likely to develop this condition:  Eating foods that are high in animal fat (saturated fat) or cholesterol.  Being overweight.  Not getting enough exercise.  A family history of high cholesterol (familial hypercholesterolemia).  Use of tobacco products.  Having diabetes. What are the signs or symptoms? There are no symptoms of this condition. How is this diagnosed? This condition may be diagnosed based on the results of a blood test.  If you are older than 71 years of age, your health care provider may check your cholesterol levels every 4-6 years.  You may be checked more often if you have high cholesterol or other risk factors for heart disease. The blood test for cholesterol measures:  "Bad" cholesterol, or LDL cholesterol. This is the main type of cholesterol that causes heart disease. The desired level is less than 100 mg/dL.  "Good" cholesterol, or HDL cholesterol. HDL helps protect against heart disease by cleaning the arteries and carrying the LDL to the liver for processing. The desired level for HDL is 60 mg/dL or higher.  Triglycerides.  These are fats that your body can store or burn for energy. The desired level is less than 150 mg/dL.  Total cholesterol. This measures the total amount of cholesterol in your blood and includes LDL, HDL, and triglycerides. The desired level is less than 200 mg/dL. How is this treated? This condition may be treated with:  Diet changes. You may be asked to eat foods that have more fiber and less saturated fats or added sugar.  Lifestyle changes. These may include regular exercise, maintaining a healthy weight, and quitting use of tobacco products.  Medicines. These are given when diet and lifestyle changes have not worked. You may be prescribed a statin medicine to help lower your cholesterol levels. Follow these instructions at home: Eating and drinking  Eat a healthy, balanced diet. This diet includes: ? Daily servings of a variety of fresh, frozen, or canned fruits and vegetables. ? Daily servings of whole grain foods that are rich in fiber. ? Foods that are low in saturated fats and trans fats. These include poultry and fish without skin, lean cuts of meat, and low-fat dairy products. ? A variety of fish, especially oily fish that contain omega-3 fatty acids. Aim to eat fish at least 2 times a week.  Avoid foods and drinks that have added sugar.  Use healthy cooking methods, such as roasting, grilling, broiling, baking, poaching, steaming, and stir-frying. Do not fry your food except for stir-frying.   Lifestyle  Get regular exercise. Aim to exercise for a total of 150 minutes a week. Increase your activity level by doing activities   such as gardening, walking, and taking the stairs.  Do not use any products that contain nicotine or tobacco, such as cigarettes, e-cigarettes, and chewing tobacco. If you need help quitting, ask your health care provider.   General instructions  Take over-the-counter and prescription medicines only as told by your health care provider.  Keep all  follow-up visits as told by your health care provider. This is important. Where to find more information  American Heart Association: www.heart.org  National Heart, Lung, and Blood Institute: www.nhlbi.nih.gov Contact a health care provider if:  You have trouble achieving or maintaining a healthy diet or weight.  You are starting an exercise program.  You are unable to stop smoking. Get help right away if:  You have chest pain.  You have trouble breathing.  You have any symptoms of a stroke. "BE FAST" is an easy way to remember the main warning signs of a stroke: ? B - Balance. Signs are dizziness, sudden trouble walking, or loss of balance. ? E - Eyes. Signs are trouble seeing or a sudden change in vision. ? F - Face. Signs are sudden weakness or numbness of the face, or the face or eyelid drooping on one side. ? A - Arms. Signs are weakness or numbness in an arm. This happens suddenly and usually on one side of the body. ? S - Speech. Signs are sudden trouble speaking, slurred speech, or trouble understanding what people say. ? T - Time. Time to call emergency services. Write down what time symptoms started.  You have other signs of a stroke, such as: ? A sudden, severe headache with no known cause. ? Nausea or vomiting. ? Seizure. These symptoms may represent a serious problem that is an emergency. Do not wait to see if the symptoms will go away. Get medical help right away. Call your local emergency services (911 in the [place] Do not drive yourself to the hospital. Summary  Cholesterol plaques increase your risk for heart attack and stroke. Work with your health care provider to keep your cholesterol levels in a healthy range.  Eat a healthy, balanced diet, get regular exercise, and maintain a healthy weight.  Do not use any products that contain nicotine or tobacco, such as cigarettes, e-cigarettes, and chewing tobacco.  Get help right away if you have any symptoms of a  stroke. This information is not intended to replace advice given to you by your health care provider. Make sure you discuss any questions you have with your health care provider. Document Revised: 11/21/2018 Document Reviewed: 11/21/2018 Elsevier Patient Education  2021 [employer]  

## 2020-05-28 ENCOUNTER — Telehealth: Payer: Medicare PPO

## 2020-05-28 ENCOUNTER — Ambulatory Visit (INDEPENDENT_AMBULATORY_CARE_PROVIDER_SITE_OTHER): Payer: Medicare PPO

## 2020-05-28 DIAGNOSIS — E7800 Pure hypercholesterolemia, unspecified: Secondary | ICD-10-CM | POA: Diagnosis not present

## 2020-05-28 DIAGNOSIS — I129 Hypertensive chronic kidney disease with stage 1 through stage 4 chronic kidney disease, or unspecified chronic kidney disease: Secondary | ICD-10-CM | POA: Diagnosis not present

## 2020-05-28 DIAGNOSIS — E1122 Type 2 diabetes mellitus with diabetic chronic kidney disease: Secondary | ICD-10-CM | POA: Diagnosis not present

## 2020-05-28 DIAGNOSIS — N184 Chronic kidney disease, stage 4 (severe): Secondary | ICD-10-CM | POA: Diagnosis not present

## 2020-05-28 DIAGNOSIS — E559 Vitamin D deficiency, unspecified: Secondary | ICD-10-CM

## 2020-06-04 ENCOUNTER — Ambulatory Visit: Payer: Medicare PPO

## 2020-06-05 ENCOUNTER — Ambulatory Visit: Payer: Medicare PPO

## 2020-06-05 ENCOUNTER — Ambulatory Visit: Payer: Medicare PPO | Admitting: Internal Medicine

## 2020-06-05 NOTE — Patient Instructions (Signed)
Goals Addressed    . COMPLETED: Assist with Chronic Care Management and Care Coordination needs       Timeframe:  Long-Range Goal Priority:  High Start Date: 04/23/20                            Expected End Date:  06/24/20  Initial RN CM Call scheduled for: 05/28/20      Patient Self Care Activities:  . Patient will work with the CCM team to address care coordination needs and will continue to work with the clinical team to address health care and disease management related needs.                       . Follow My Treatment Plan-Chronic Kidney   On track    Timeframe:  Long-Range Goal Priority:  High Start Date:  05/28/20                           Expected End Date:  05/28/21                    Follow Up Date: 07/17/20   Self Care Activities:  . Continue to adhere to MD recommendations for CKD  . Continue to keep all scheduled follow up appointments . Take medications as directed  . Let your healthcare team know if you are unable to take your medications . Call your pharmacy for refills at least 7 days prior to running out of medication . Increase your water intake unless otherwise directed . Review mailed printed educational materials related to kidney disease Patient Goals: - maintain and or improve kidney function    Why is this important?    Staying as healthy as you can is very important. This may mean making changes if you smoke, don't exercise or eat poorly.   A healthy lifestyle is an important goal for you.   Following the treatment plan and making changes may be hard.   Try some of these steps to help keep the disease from getting worse.     Notes:     Marland Kitchen Monitor and Manage My Blood Sugar-Diabetes Type 2   On track    Timeframe:  Long-Range Goal Priority:  Medium Start Date:  05/28/20                           Expected End Date: 05/28/21                    Follow Up Date: 07/17/20    - check blood sugar at prescribed times - enter blood sugar readings and  medication or insulin into daily log - take the blood sugar log to all doctor visits - take the blood sugar meter to all doctor visits    Why is this important?    Checking your blood sugar at home helps to keep it from getting very high or very low.   Writing the results in a diary or log helps the doctor know how to care for you.   Your blood sugar log should have the time, date and the results.   Also, write down the amount of insulin or other medicine that you take.   Other information, like what you ate, exercise done and how you were feeling, will also be helpful.  Notes:     Marland Kitchen Obtain Eye Exam-Diabetes Type 2       Timeframe:  Long-Range Goal Priority:  Medium Start Date:  05/28/20                           Expected End Date: 05/27/20                       Follow Up Date: 07/17/20     - keep appointment with eye doctor - schedule appointment with eye doctor    Why is this important?    Eye check-ups are important when you have diabetes.   Vision loss can be prevented.    Notes:     . Perform Foot Care-Diabetes Type 2       Timeframe:  Long-Range Goal Priority:  Medium Start Date:  05/28/20                           Expected End Date:  05/28/21                     Follow Up Date: 07/17/20    - check feet daily for cuts, sores or redness - keep feet up while sitting - trim toenails straight across - wash and dry feet carefully every day - wear comfortable, cotton socks - wear comfortable, well-fitting shoes    Why is this important?    Good foot care is very important when you have diabetes.   There are many things you can do to keep your feet healthy and catch a problem early.    Notes:     . Pure Hypercholesterolemia - treatment optimized   On track    Timeframe:  Long-Range Goal Priority:  High Start Date:  05/28/20                           Expected End Date:  05/28/21   Next Scheduled Follow up date: 07/17/20   Self-Care Activities:  . Continue  to keep all scheduled follow up appointments . Take medications as directed  . Let your healthcare team know if you are unable to take your medications . Call your pharmacy for refills at least 7 days prior to running out of medication . Adhere to dietary and exercise recommendations  Patient Goal:  - to lower Cholesterol levels to optimal health                         . Vitamin D deficiency - treatment optimized   On track    Timeframe:  Long-Range Goal Priority:  High Start Date:  05/28/20                           Expected End Date:  05/28/21  Next Scheduled Follow up date: 07/17/20  Self Care Activities:  . Continue to keep all scheduled follow up appointments . Take medications as directed  . Let your healthcare team know if you are unable to take your medications . Call your pharmacy for refills at least 7 days prior to running out of medication Patient Goals: - to improve Vitamin D level to optimal health  Patient Care Plan: Assist with Chronic Care Management and Care Cooridnation needs  Completed 06/05/2020  Problem Identified: Assist with Chronic Care Management and Care Coordination needs Resolved 05/28/2020  Priority: High    Long-Range Goal: Assist with Chronic Care Management and Care Coordination needs Completed 05/28/2020  Start Date: 04/23/2020  Expected End Date: 06/24/2020  Recent Progress: On track  Priority: High  Note:   Current Barriers:  Marland Kitchen Knowledge Barriers related to resources and support available to address needs related to Type II diabetes mellitus with stage 4 chronic kidney disease, Hypertensive nephropathy and Caregiver resources.  Case Manager Clinical Goal(s):  Marland Kitchen Over the next 60 days, patient will work with the CCM team to address needs related to chronic care management for Type II diabetes mellitus with stage 4 chronic kidney disease, Hypertensive nephropathy and Caregiver resources. Interventions:  . Collaborated  with  embedded BSW Daneen Schick  to establish an individualized plan of care.  Patient Self Care Activities:  . Patient will work with the CCM team to address care coordination needs and will continue to work with the clinical team to address health care and disease management related needs.    Initial RN CM Call scheduled for: 05/28/20     Patient Care Plan: Social Work [facility] Care Plan    Problem Identified: Caregiver Stress     Long-Range Goal: Caregiver Coping Optimized   Start Date: 04/23/2020  Expected End Date: 08/21/2020  Recent Progress: On track  Priority: High  Note:   Current Barriers:  . Chronic disease management support and education needs related to DM and CKD Stage IV   . Limited knowledge of caregiver resources  Social Worker Clinical Goal(s):  Marland Kitchen Over the next 120 days, patient will work with SW to identify and address any acute and/or chronic care coordination needs related to the self health management of DM and CKD Stage IV   . Over the next 120 days the patient will work with SW to identify caregiver respite resources  CCM SW Interventions:  . Inter-disciplinary care team collaboration (see longitudinal plan of care) . Collaboration with Glendale Chard, MD regarding development and update of comprehensive plan of care as evidenced by provider attestation and co-signature . Successful outbound call placed to the patient to assess goal progression . Determined the patient has yet to be contacted by [employer] [facility] [employer] . Collaboration with Francene Finders to request contact with Edd Fabian from the Caregiver Respite program to confirm receipt of referral . Scheduled follow up over the next month  Patient Goals/Self-Care Activities . Over the next 30 days, patient will:   - Patient will self administer medications as prescribed Patient will attend all scheduled provider appointments Engage with [employer] [facility] as needed  regarding Vian as needed prior to next scheduled call  Follow Up Plan:  SW will follow up with the patient over the next month.     Patient Care Plan: Chronic Kidney (Adult)    Problem Identified: Disease Progression   Priority: High    Long-Range Goal: Disease Progression Prevented or Minimized   Start Date: 05/28/2020  Expected End Date: 05/28/2021  This Visit's Progress: On track  Priority: High  Note:   Current Barriers:   Ineffective Self Health Maintenance  Clinical Goal(s):  Marland Kitchen Collaboration with Glendale Chard, MD regarding development and update of comprehensive plan of care as evidenced by provider attestation and co-signature . Inter-disciplinary care team collaboration (see  longitudinal plan of care)  patient will work with care management team to address care coordination and chronic disease management needs related to Disease Management  Educational Needs  Care Coordination  Medication Management and Education  Psychosocial Support   Interventions:  05/28/20 completed successful outbound call with patient   Evaluation of current treatment plan related to  stage IV CKD , self-management and patient's adherence to plan as established by provider.  Collaboration with Glendale Chard, MD regarding development and update of comprehensive plan of care as evidenced by provider attestation       and co-signature  Inter-disciplinary care team collaboration (see longitudinal plan of care) . Provided education to patient about basic disease process related to Chronic Kidney disease  . Review of patient status, including review of consultant's reports, relevant laboratory and other test results, and medications completed. . Reviewed medications with patient and discussed importance of medication adherence . Educated patient on dietary recommendations, importance of good blood pressure and diabetes control  . Educated on importance of  increasing water intake to 64 oz daily unless otherwise directed  . Mailed printed educational materials related to stages of Chronic Kidney disease   Discussed plans with patient for ongoing care management follow up and provided patient with direct contact information for care management team Self Care Activities:  . Continue to adhere to MD recommendations for CKD  . Continue to keep all scheduled follow up appointments . Take medications as directed  . Let your healthcare team know if you are unable to take your medications . Call your pharmacy for refills at least 7 days prior to running out of medication . Increase your water intake unless otherwise directed . Review mailed printed educational materials related to kidney disease Patient Goals: - maintain and or improve kidney function   Follow Up Plan: Telephone follow up appointment with care management team member scheduled for: 07/17/20   Patient Care Plan: Diabetes Type 2 (Adult)    Problem Identified: Glycemic Management (Diabetes, Type 2)   Priority: Medium    Long-Range Goal: Glycemic Management Optimized   Start Date: 05/28/2020  Expected End Date: 05/28/2021  This Visit's Progress: On track  Priority: Medium  Note:   Objective:  Lab Results  Component Value Date   HGBA1C 6.5 (H) 04/09/2020 .   Lab Results  Component Value Date   CREATININE 2.21 (H) 04/09/2020   CREATININE 2.18 (H) 01/15/2020   CREATININE 2.43 (H) 10/19/2019 .   Lab Results  Component Value Date   EGFR 23 (L) 04/09/2020 .   Current Barriers:  Marland Kitchen Knowledge Deficits related to basic Diabetes pathophysiology and self care/management . Knowledge Deficits related to medications used for management of diabetes Case Manager Clinical Goal(s):  . patient will demonstrate improved adherence to prescribed treatment plan for diabetes self care/management as evidenced by: daily monitoring and recording of CBG  adherence to ADA/ carb modified diet exercise  5 days/week adherence to prescribed medication regimen contacting provider for new or worsened symptoms or questions Interventions:  05/28/20 completed successful outbound call with patient  . Collaboration with Glendale Chard, MD regarding development and update of comprehensive plan of care as evidenced by provider attestation and co-signature . Inter-disciplinary care team collaboration (see longitudinal plan of care) . Provided education to patient about basic DM disease process . Review of patient status, including review of consultants reports, relevant laboratory and other test results, and medications completed. . Educated patient on dietary and exercise recommendations; daily glycemic control  FBS 80-130, <180 after meals;15'15' rule . Reviewed medications with patient and discussed importance of medication adherence . Provided patient with written educational materials related to hypo and hyperglycemia and importance of correct treatment . Advised patient, providing education and rationale, to check cbg daily before meals and at bedtime, before and after exercise and record, calling the PCP and or RNCM for findings outside established parameters.   . Mailed printed educational materials related to Diabetes Management  . Discussed plans with patient for ongoing care management follow up and provided patient with direct contact information for care management team Self-Care Activities - Self administers injectable DM medication (Ozempic) as prescribed Attends all scheduled provider appointments Checks blood sugars as prescribed and utilize hyper and hypoglycemia protocol as needed Adheres to prescribed ADA/carb modified Patient Goals: - check blood sugar at prescribed times - enter blood sugar readings and medication or insulin into daily log - take the blood sugar log to all doctor visits - take the blood sugar meter to all doctor visits - manage portion size - keep appointment with  eye doctor - schedule appointment with eye doctor - check feet daily for cuts, sores or redness - keep feet up while sitting - trim toenails straight across - wash and dry feet carefully every day - wear comfortable, cotton socks - wear comfortable, well-fitting shoes Follow Up Plan: Telephone follow up appointment with care management team member scheduled for: 07/17/20    Patient Care Plan: Pure Hypercholesterolemia    Problem Identified: Pure Hypercholesterolemia   Priority: High    Long-Range Goal: Pure Hypercholesterolemia - treatment optimized   Start Date: 05/28/2020  Expected End Date: 05/28/2021  This Visit's Progress: On track  Priority: High  Note:   Current Barriers:   Ineffective Self Health Maintenance  Clinical Goal(s):  Marland Kitchen Collaboration with Glendale Chard, MD regarding development and update of comprehensive plan of care as evidenced by provider attestation and co-signature . Inter-disciplinary care team collaboration (see longitudinal plan of care)  patient will work with care management team to address care coordination and chronic disease management needs related to Disease Management  Educational Needs  Care Coordination  Medication Management and Education  Psychosocial Support   Interventions:  05/28/20 completed successful outbound call with patient   Evaluation of current treatment plan related to  Pure Hypercholesterolemia , self-management and patient's adherence to plan as established by provider.  Collaboration with Glendale Chard, MD regarding development and update of comprehensive plan of care as evidenced by provider attestation       and co-signature  Inter-disciplinary care team collaboration (see longitudinal plan of care) . Provided education to patient about basic disease process related to Pure Hypercholesterolemia . Review of patient status, including review of consultant's reports, relevant laboratory and other test results, and  medications completed. . Reviewed medications with patient and discussed importance of medication adherence . Educated patient on dietary and exercise recommendations . Mailed printed educational material related to lowering Cholesterol   Discussed plans with patient for ongoing care management follow up and provided patient with direct contact information for care management team Self-Care Activities:  . Continue to keep all scheduled follow up appointments . Take medications as directed  . Let your healthcare team know if you are unable to take your medications . Call your pharmacy for refills at least 7 days prior to running out of medication . Adhere to dietary and exercise recommendations  Patient Goal:  - to lower Cholesterol levels to optimal health  Follow Up Plan: Telephone follow up appointment with care management team member scheduled for: 07/17/20     Patient Care Plan: Vitamin D deficiency    Problem Identified: Vitamin D deficiency   Priority: High    Long-Range Goal: Vitamin D deficiency - treatment optimized   Start Date: 05/28/2020  Expected End Date: 05/28/2021  This Visit's Progress: On track  Priority: High  Note:   Current Barriers:   Ineffective Self Health Maintenance  Clinical Goal(s):  Marland Kitchen Collaboration with Glendale Chard, MD regarding development and update of comprehensive plan of care as evidenced by provider attestation and co-signature . Inter-disciplinary care team collaboration (see longitudinal plan of care)  patient will work with care management team to address care coordination and chronic disease management needs related to Disease Management  Educational Needs  Care Coordination  Medication Management and Education  Psychosocial Support   Interventions:  05/28/20 completed successful outbound call with patient   Evaluation of current treatment plan related to Vitamin D deficiency, self-management and patient's adherence to plan as  established by provider.  Collaboration with Glendale Chard, MD regarding development and update of comprehensive plan of care as evidenced by provider attestation       and co-signature  Inter-disciplinary care team collaboration (see longitudinal plan of care) . Provided education to patient about basic disease process related to Vitamin D deficiency  . Review of patient status, including review of consultant's reports, relevant laboratory and other test results, and medications completed. . Reviewed medications with patient and discussed importance of medication adherence . Educated patient on dietary recommendations; importance of getting at least 15 minutes of natural sunlight daily when possible . Mailed printed educational materials related to Vitamin D deficiency   Discussed plans with patient for ongoing care management follow up and provided patient with direct contact information for care management team Self Care Activities:  . Continue to keep all scheduled follow up appointments . Take medications as directed  . Let your healthcare team know if you are unable to take your medications . Call your pharmacy for refills at least 7 days prior to running out of medication Patient Goals: - to improve Vitamin D level to optimal health   Follow Up Plan: Telephone follow up appointment with care management team member scheduled for: 07/17/20

## 2020-06-05 NOTE — Chronic Care Management (AMB) (Signed)
Chronic Care Management   CCM RN Visit Note  05/28/2020 Name: Michelle Cummings MRN: [id number] DOB: Dec 09, 1950  Subjective: Michelle Cummings is a 70 y.o. year old female who is a primary care patient of Glendale Chard, MD. The care management team was consulted for assistance with disease management and care coordination needs.    Engaged with patient by telephone for initial visit in response to provider referral for case management and/or care coordination services.   Consent to Services:  The patient was given the following information about Chronic Care Management services today, agreed to services, and gave verbal consent: 1. CCM service includes personalized support from designated clinical staff supervised by the primary care provider, including individualized plan of care and coordination with other care providers 2. 24/7 contact phone numbers for assistance for urgent and routine care needs. 3. Service will only be billed when office clinical staff spend 20 minutes or more in a month to coordinate care. 4. Only one practitioner may furnish and bill the service in a calendar month. 5.The patient may stop CCM services at any time (effective at the end of the month) by phone call to the office staff. 6. The patient will be responsible for cost sharing (co-pay) of up to 20% of the service fee (after annual deductible is met). Patient agreed to services and consent obtained.  Patient agreed to services and verbal consent obtained.   Assessment: Review of patient past medical history, allergies, medications, health status, including review of consultants reports, laboratory and other test data, was performed as part of comprehensive evaluation and provision of chronic care management services.   SDOH (Social Determinants of Health) assessments and interventions performed:    CCM Care Plan  No Known Allergies  Outpatient Encounter Medications as of 05/28/2020  Medication Sig  . ACCU-CHEK GUIDE  test strip USE AS DIRECTED TO CHECK BLOOD SUGARS 2 TIMES A DAY DX: E11.22  . Accu-Chek Softclix Lancets lancets USE AS DIRECTED TO CHECK BLOOD SUGARS 2 TIMES A DAY DX: E11.22  . allopurinol (ZYLOPRIM) 100 MG tablet TK 2 TS PO D  . amLODipine (NORVASC) 10 MG tablet TAKE 1 TABLET(10 MG) BY MOUTH DAILY  . carvedilol (COREG) 6.25 MG tablet TAKE 1 TABLET BY MOUTH TWICE DAILY  . ferrous sulfate 325 (65 FE) MG tablet Take 1 tablet (325 mg total) by mouth daily with breakfast.  . latanoprost (XALATAN) 0.005 % ophthalmic solution place 1 drop into both eyes at bedtime  . MITIGARE 0.6 MG CAPS Take 1 capsule by mouth daily  . rosuvastatin (CRESTOR) 10 MG tablet TAKE 1 TABLET(10 MG) BY MOUTH DAILY  . Semaglutide,0.25 or 0.5MG/DOS, (OZEMPIC, 0.25 OR 0.5 MG/DOSE,) 2 MG/1.5ML SOPN INJECT 0.5MG INTO THE SKIN ONCE A WEEK  . timolol (TIMOPTIC-XR) 0.5 % ophthalmic gel-forming Place 1 drop into both eyes daily.  . valsartan-hydrochlorothiazide (DIOVAN-HCT) 80-12.5 MG tablet TAKE 1 TABLET BY MOUTH DAILY  . vitamin C (ASCORBIC ACID) 500 MG tablet Take 1,000 mg by mouth daily.  . Vitamin D, Ergocalciferol, (DRISDOL) 1.25 MG (50000 UNIT) CAPS capsule Take 1 capsule (50,000 Units total) by mouth every 7 (seven) days.   No facility-administered encounter medications on file as of 05/28/2020.    Patient Active Problem List   Diagnosis Date Noted  . Achilles tendinitis of right lower extremity 05/22/2020  . Great toe pain, left 05/22/2020  . Hypertensive nephropathy 01/26/2020  . Abnormal ankle brachial index (ABI) 01/26/2020  . Adjustment disorder with anxiety 01/26/2020  .  Atherosclerosis of aorta ([place]) 02/01/2018  . Gout 07/02/2016  . Anemia of chronic disease 06/07/2016  . HLD (hyperlipidemia) 06/02/2016  . Glaucoma 06/02/2016  . Chronic kidney disease, stage 3, mod decreased GFR (HCC) 06/02/2016  . HTN (hypertension) 03/05/2011  . Type 2 diabetes mellitus with stage 3 chronic kidney disease ([place]) 03/05/2011     Conditions to be addressed/monitored:Type II diabetes mellitus with stage 4 chronic kidney disease, Hypertensive nephropathy, Pure Hypercholesterolemia, Vitamin D deficiency   Care Plan : Assist with Chronic Care Management and Care Cooridnation needs  Updates made by Lynne Logan, RN since 05/28/2020 12:00 AM  Completed 06/05/2020  Problem: Assist with Chronic Care Management and Care Coordination needs Resolved 05/28/2020  Priority: High    Long-Range Goal: Assist with Chronic Care Management and Care Coordination needs Completed 05/28/2020  Start Date: 04/23/2020  Expected End Date: 06/24/2020  Recent Progress: On track  Priority: High  Note:   Current Barriers:  Marland Kitchen Knowledge Barriers related to resources and support available to address needs related to Type II diabetes mellitus with stage 4 chronic kidney disease, Hypertensive nephropathy and Caregiver resources.  Case Manager Clinical Goal(s):  Marland Kitchen Over the next 60 days, patient will work with the CCM team to address needs related to chronic care management for Type II diabetes mellitus with stage 4 chronic kidney disease, Hypertensive nephropathy and Caregiver resources. Interventions:  . Collaborated with  embedded BSW Daneen Schick  to establish an individualized plan of care.  Patient Self Care Activities:  . Patient will work with the CCM team to address care coordination needs and will continue to work with the clinical team to address health care and disease management related needs.    Initial RN CM Call scheduled for: 05/28/20     Care Plan : Chronic Kidney (Adult)  Updates made by Lynne Logan, RN since 06/05/2020 12:00 AM    Problem: Disease Progression   Priority: High    Long-Range Goal: Disease Progression Prevented or Minimized   Start Date: 05/28/2020  Expected End Date: 05/28/2021  This Visit's Progress: On track  Priority: High  Note:   Current Barriers:   Ineffective Self Health Maintenance  Clinical  Goal(s):  Marland Kitchen Collaboration with Glendale Chard, MD regarding development and update of comprehensive plan of care as evidenced by provider attestation and co-signature . Inter-disciplinary care team collaboration (see longitudinal plan of care)  patient will work with care management team to address care coordination and chronic disease management needs related to Disease Management  Educational Needs  Care Coordination  Medication Management and Education  Psychosocial Support   Interventions:  05/28/20 completed successful outbound call with patient   Evaluation of current treatment plan related to  stage IV CKD , self-management and patient's adherence to plan as established by provider.  Collaboration with Glendale Chard, MD regarding development and update of comprehensive plan of care as evidenced by provider attestation       and co-signature  Inter-disciplinary care team collaboration (see longitudinal plan of care) . Provided education to patient about basic disease process related to Chronic Kidney disease  . Review of patient status, including review of consultant's reports, relevant laboratory and other test results, and medications completed. . Reviewed medications with patient and discussed importance of medication adherence . Educated patient on dietary recommendations, importance of good blood pressure and diabetes control  . Educated on importance of increasing water intake to 64 oz daily unless otherwise directed  . Mailed printed educational  materials related to stages of Chronic Kidney disease   Discussed plans with patient for ongoing care management follow up and provided patient with direct contact information for care management team Self Care Activities:  . Continue to adhere to MD recommendations for CKD  . Continue to keep all scheduled follow up appointments . Take medications as directed  . Let your healthcare team know if you are unable to take your  medications . Call your pharmacy for refills at least 7 days prior to running out of medication . Increase your water intake unless otherwise directed . Review mailed printed educational materials related to kidney disease Patient Goals: - maintain and or improve kidney function   Follow Up Plan: Telephone follow up appointment with care management team member scheduled for: 07/17/20   Care Plan : Diabetes Type 2 (Adult)  Updates made by Lynne Logan, RN since 06/05/2020 12:00 AM    Problem: Glycemic Management (Diabetes, Type 2)   Priority: Medium    Long-Range Goal: Glycemic Management Optimized   Start Date: 05/28/2020  Expected End Date: 05/28/2021  This Visit's Progress: On track  Priority: Medium  Note:   Objective:  Lab Results  Component Value Date   HGBA1C 6.5 (H) 04/09/2020 .   Lab Results  Component Value Date   CREATININE 2.21 (H) 04/09/2020   CREATININE 2.18 (H) 01/15/2020   CREATININE 2.43 (H) 10/19/2019 .   Lab Results  Component Value Date   EGFR 23 (L) 04/09/2020 .   Current Barriers:  Marland Kitchen Knowledge Deficits related to basic Diabetes pathophysiology and self care/management . Knowledge Deficits related to medications used for management of diabetes Case Manager Clinical Goal(s):  . patient will demonstrate improved adherence to prescribed treatment plan for diabetes self care/management as evidenced by: daily monitoring and recording of CBG  adherence to ADA/ carb modified diet exercise 5 days/week adherence to prescribed medication regimen contacting provider for new or worsened symptoms or questions Interventions:  05/28/20 completed successful outbound call with patient  . Collaboration with Glendale Chard, MD regarding development and update of comprehensive plan of care as evidenced by provider attestation and co-signature . Inter-disciplinary care team collaboration (see longitudinal plan of care) . Provided education to patient about basic DM disease  process . Review of patient status, including review of consultants reports, relevant laboratory and other test results, and medications completed. . Educated patient on dietary and exercise recommendations; daily glycemic control FBS 80-130, <180 after meals;15'15' rule . Reviewed medications with patient and discussed importance of medication adherence . Provided patient with written educational materials related to hypo and hyperglycemia and importance of correct treatment . Advised patient, providing education and rationale, to check cbg daily before meals and at bedtime, before and after exercise and record, calling the PCP and or RNCM for findings outside established parameters.   . Mailed printed educational materials related to Diabetes Management  . Discussed plans with patient for ongoing care management follow up and provided patient with direct contact information for care management team Self-Care Activities - Self administers injectable DM medication (Ozempic) as prescribed Attends all scheduled provider appointments Checks blood sugars as prescribed and utilize hyper and hypoglycemia protocol as needed Adheres to prescribed ADA/carb modified Patient Goals: - check blood sugar at prescribed times - enter blood sugar readings and medication or insulin into daily log - take the blood sugar log to all doctor visits - take the blood sugar meter to all doctor visits - manage portion size -  keep appointment with eye doctor - schedule appointment with eye doctor - check feet daily for cuts, sores or redness - keep feet up while sitting - trim toenails straight across - wash and dry feet carefully every day - wear comfortable, cotton socks - wear comfortable, well-fitting shoes Follow Up Plan: Telephone follow up appointment with care management team member scheduled for: 07/17/20    Care Plan : Pure Hypercholesterolemia  Updates made by Lynne Logan, RN since 06/05/2020 12:00 AM     Problem: Pure Hypercholesterolemia   Priority: High    Long-Range Goal: Pure Hypercholesterolemia - treatment optimized   Start Date: 05/28/2020  Expected End Date: 05/28/2021  This Visit's Progress: On track  Priority: High  Note:   Current Barriers:   Ineffective Self Health Maintenance  Clinical Goal(s):  Marland Kitchen Collaboration with Glendale Chard, MD regarding development and update of comprehensive plan of care as evidenced by provider attestation and co-signature . Inter-disciplinary care team collaboration (see longitudinal plan of care)  patient will work with care management team to address care coordination and chronic disease management needs related to Disease Management  Educational Needs  Care Coordination  Medication Management and Education  Psychosocial Support   Interventions:  05/28/20 completed successful outbound call with patient   Evaluation of current treatment plan related to  Pure Hypercholesterolemia , self-management and patient's adherence to plan as established by provider.  Collaboration with Glendale Chard, MD regarding development and update of comprehensive plan of care as evidenced by provider attestation       and co-signature  Inter-disciplinary care team collaboration (see longitudinal plan of care) . Provided education to patient about basic disease process related to Pure Hypercholesterolemia . Review of patient status, including review of consultant's reports, relevant laboratory and other test results, and medications completed. . Reviewed medications with patient and discussed importance of medication adherence . Educated patient on dietary and exercise recommendations . Mailed printed educational material related to lowering Cholesterol   Discussed plans with patient for ongoing care management follow up and provided patient with direct contact information for care management team Self-Care Activities:  . Continue to keep all scheduled  follow up appointments . Take medications as directed  . Let your healthcare team know if you are unable to take your medications . Call your pharmacy for refills at least 7 days prior to running out of medication . Adhere to dietary and exercise recommendations  Patient Goal:  - to lower Cholesterol levels to optimal health   Follow Up Plan: Telephone follow up appointment with care management team member scheduled for: 07/17/20   Care Plan : Vitamin D deficiency  Updates made by Lynne Logan, RN since 06/05/2020 12:00 AM    Problem: Vitamin D deficiency   Priority: High    Long-Range Goal: Vitamin D deficiency - treatment optimized   Start Date: 05/28/2020  Expected End Date: 05/28/2021  This Visit's Progress: On track  Priority: High  Note:   Current Barriers:   Ineffective Self Health Maintenance  Clinical Goal(s):  Marland Kitchen Collaboration with Glendale Chard, MD regarding development and update of comprehensive plan of care as evidenced by provider attestation and co-signature . Inter-disciplinary care team collaboration (see longitudinal plan of care)  patient will work with care management team to address care coordination and chronic disease management needs related to Disease Management  Educational Needs  Care Coordination  Medication Management and Education  Psychosocial Support   Interventions:  05/28/20 completed successful outbound call  with patient   Evaluation of current treatment plan related to Vitamin D deficiency, self-management and patient's adherence to plan as established by provider.  Collaboration with Glendale Chard, MD regarding development and update of comprehensive plan of care as evidenced by provider attestation       and co-signature  Inter-disciplinary care team collaboration (see longitudinal plan of care) . Provided education to patient about basic disease process related to Vitamin D deficiency  . Review of patient status, including review  of consultant's reports, relevant laboratory and other test results, and medications completed. . Reviewed medications with patient and discussed importance of medication adherence . Educated patient on dietary recommendations; importance of getting at least 15 minutes of natural sunlight daily when possible . Mailed printed educational materials related to Vitamin D deficiency   Discussed plans with patient for ongoing care management follow up and provided patient with direct contact information for care management team Self Care Activities:  . Continue to keep all scheduled follow up appointments . Take medications as directed  . Let your healthcare team know if you are unable to take your medications . Call your pharmacy for refills at least 7 days prior to running out of medication Patient Goals: - to improve Vitamin D level to optimal health   Follow Up Plan: Telephone follow up appointment with care management team member scheduled for: 07/17/20     Plan:Telephone follow up appointment with care management team member scheduled for:  07/17/20  Barb Merino, RN, BSN, CCM Care Management Coordinator [place] [facility]  Direct Phone: [phone]

## 2020-06-21 ENCOUNTER — Ambulatory Visit (INDEPENDENT_AMBULATORY_CARE_PROVIDER_SITE_OTHER): Payer: Medicare PPO

## 2020-06-21 DIAGNOSIS — I129 Hypertensive chronic kidney disease with stage 1 through stage 4 chronic kidney disease, or unspecified chronic kidney disease: Secondary | ICD-10-CM | POA: Diagnosis not present

## 2020-06-21 DIAGNOSIS — E1122 Type 2 diabetes mellitus with diabetic chronic kidney disease: Secondary | ICD-10-CM | POA: Diagnosis not present

## 2020-06-21 DIAGNOSIS — N184 Chronic kidney disease, stage 4 (severe): Secondary | ICD-10-CM | POA: Diagnosis not present

## 2020-06-21 NOTE — Chronic Care Management (AMB) (Signed)
Chronic Care Management    Social Work Note  06/21/2020 Name: Michelle Cummings MRN: [id number] DOB: 12-Jun-1950  Michelle Cummings is a 70 y.o. year old female who is a primary care patient of Glendale Chard, MD. The CCM team was consulted to assist the patient with chronic disease management and/or care coordination needs related to: Caregiver Stress.   Engaged with patient by telephone for follow up visit in response to provider referral for social work chronic care management and care coordination services.   Consent to Services:  The patient was given information about Chronic Care Management services, agreed to services, and gave verbal consent prior to initiation of services.  Please see initial visit note for detailed documentation.   Patient agreed to services and consent obtained.   Assessment: Review of patient past medical history, allergies, medications, and health status, including review of relevant consultants reports was performed today as part of a comprehensive evaluation and provision of chronic care management and care coordination services.     SDOH (Social Determinants of Health) assessments and interventions performed:    Advanced Directives Status: Not addressed in this encounter.  CCM Care Plan  No Known Allergies  Outpatient Encounter Medications as of 06/21/2020  Medication Sig   ACCU-CHEK GUIDE test strip USE AS DIRECTED TO CHECK BLOOD SUGARS 2 TIMES A DAY DX: E11.22   Accu-Chek Softclix Lancets lancets USE AS DIRECTED TO CHECK BLOOD SUGARS 2 TIMES A DAY DX: E11.22   allopurinol (ZYLOPRIM) 100 MG tablet TK 2 TS PO D   amLODipine (NORVASC) 10 MG tablet TAKE 1 TABLET(10 MG) BY MOUTH DAILY   carvedilol (COREG) 6.25 MG tablet TAKE 1 TABLET BY MOUTH TWICE DAILY   ferrous sulfate 325 (65 FE) MG tablet Take 1 tablet (325 mg total) by mouth daily with breakfast.   latanoprost (XALATAN) 0.005 % ophthalmic solution place 1 drop into both eyes at bedtime   MITIGARE 0.6 MG CAPS  Take 1 capsule by mouth daily   rosuvastatin (CRESTOR) 10 MG tablet TAKE 1 TABLET(10 MG) BY MOUTH DAILY   Semaglutide,0.25 or 0.'5MG'$ /DOS, (OZEMPIC, 0.25 OR 0.5 MG/DOSE,) 2 MG/1.5ML SOPN INJECT 0.'5MG'$  INTO THE SKIN ONCE A WEEK   timolol (TIMOPTIC-XR) 0.5 % ophthalmic gel-forming Place 1 drop into both eyes daily.   valsartan-hydrochlorothiazide (DIOVAN-HCT) 80-12.5 MG tablet TAKE 1 TABLET BY MOUTH DAILY   vitamin C (ASCORBIC ACID) 500 MG tablet Take 1,000 mg by mouth daily.   Vitamin D, Ergocalciferol, (DRISDOL) 1.25 MG (50000 UNIT) CAPS capsule Take 1 capsule (50,000 Units total) by mouth every 7 (seven) days.   No facility-administered encounter medications on file as of 06/21/2020.    Patient Active Problem List   Diagnosis Date Noted   Achilles tendinitis of right lower extremity 05/22/2020   Great toe pain, left 05/22/2020   Hypertensive nephropathy 01/26/2020   Abnormal ankle brachial index (ABI) 01/26/2020   Adjustment disorder with anxiety 01/26/2020   Atherosclerosis of aorta ([place]) 02/01/2018   Gout 07/02/2016   Anemia of chronic disease 06/07/2016   HLD (hyperlipidemia) 06/02/2016   Glaucoma 06/02/2016   Chronic kidney disease, stage 3, mod decreased GFR ([place]) 06/02/2016   HTN (hypertension) 03/05/2011   Type 2 diabetes mellitus with stage 3 chronic kidney disease ([place]) 03/05/2011    Conditions to be addressed/monitored: DMII and CKD Stage IV ;  Caregiver Stress  Care Plan : Social Work [place]  Updates made by Daneen Schick since 06/21/2020 12:00 AM     Problem:  Caregiver Stress      Long-Range Goal: Caregiver Coping Optimized   Start Date: 04/23/2020  Expected End Date: 08/21/2020  Recent Progress: On track  Priority: High  Note:   Current Barriers:  Chronic disease management support and education needs related to DM and CKD Stage IV   Limited knowledge of caregiver resources  Social Worker Clinical Goal(s):   patient will work with SW to identify and  address any acute and/or chronic care coordination needs related to the self health management of DM and CKD Stage IV    patient will work with SW to identify caregiver respite resources  CCM SW Interventions:  Inter-disciplinary care team collaboration (see longitudinal plan of care) Collaboration with Glendale Chard, MD regarding development and update of comprehensive plan of care as evidenced by provider attestation and co-signature Successful outbound call placed to the patient to assess goal progression Determined the patient has yet to be contacted by [employer] [facility] [employer] [facility] Advised Mrs. Shone there has been some turnover at [employer] and that SW would follow up on status of referral Unsuccessful call placed to [employer] [facility] message left requesting the [employer] return SW call Scheduled follow up over the next week  Patient Goals/Self-Care Activities patient will:   -  Museum/gallery conservator with Tax adviser as needed regarding [place] as needed prior to next scheduled call  Follow Up Plan:  SW will follow up with the patient over the next week        Follow Up Plan: SW will follow up with patient by phone over the next week      Daneen Schick, BSW, CDP Social Worker, Certified Dementia Practitioner [place] [facility] [place] [facility] [phone]  Total time spent performing care coordination and/or care management activities with the patient by phone or face to face = 20 minutes.

## 2020-06-21 NOTE — Patient Instructions (Signed)
Social Worker Visit Information  Goals we discussed today:   Goals Addressed             This Visit's Progress    Caregiver Coping Optimized       Timeframe:  Long-Range Goal Priority:  High Start Date:    4.19.22                         Expected End Date: 8.17.22                      Next planned outreach: 6.23.22  Patient Goals/Self-Care Activities  patient will:  Engage with Tax adviser as needed regarding Research officer, trade union SW as needed prior to next scheduled call          Follow Up Plan: SW will follow up with patient by phone over the next week  Daneen Schick, BSW, CDP Social Worker, Certified Dementia Practitioner [place] [facility] [place] [facility] [phone]

## 2020-06-27 ENCOUNTER — Ambulatory Visit: Payer: Medicare PPO

## 2020-06-27 DIAGNOSIS — E1122 Type 2 diabetes mellitus with diabetic chronic kidney disease: Secondary | ICD-10-CM

## 2020-06-27 DIAGNOSIS — I129 Hypertensive chronic kidney disease with stage 1 through stage 4 chronic kidney disease, or unspecified chronic kidney disease: Secondary | ICD-10-CM

## 2020-06-27 DIAGNOSIS — N184 Chronic kidney disease, stage 4 (severe): Secondary | ICD-10-CM

## 2020-06-27 NOTE — Patient Instructions (Signed)
Social Worker Visit Information  Goals we discussed today:   Goals Addressed             This Visit's Progress    Caregiver Coping Optimized       Timeframe:  Long-Range Goal Priority:  High Start Date:    4.19.22                         Expected End Date: 8.17.22                      Next planned outreach: 7.13.22  Patient Goals/Self-Care Activities  patient will:  Engage with Tax adviser as needed regarding Research officer, trade union SW as needed prior to next scheduled call          Materials Provided: No. Patient not reached.  Follow Up Plan: SW will follow up with patient by phone over the next month  Daneen Schick, BSW, CDP Social Worker, Certified Dementia Practitioner [place] [facility] [place] [facility] [phone]

## 2020-06-27 NOTE — Chronic Care Management (AMB) (Signed)
Chronic Care Management    Social Work Note  06/27/2020 Name: Michelle Cummings MRN: [id number] DOB: 1950-05-26  Michelle Cummings is a 70 y.o. year old female who is a primary care patient of Michelle Chard, MD. The CCM team was consulted to assist the patient with chronic disease management and/or care coordination needs related to: Caregiver Stress.   Collaboration with Michelle Cummings  for  referral follow up  in response to provider referral for social work chronic care management and care coordination services.   Consent to Services:  The patient was given information about Chronic Care Management services, agreed to services, and gave verbal consent prior to initiation of services.  Please see initial visit note for detailed documentation.   Patient agreed to services and consent obtained.   Assessment: Review of patient past medical history, allergies, medications, and health status, including review of relevant consultants reports was performed today as part of a comprehensive evaluation and provision of chronic care management and care coordination services.     SDOH (Social Determinants of Health) assessments and interventions performed:    Advanced Directives Status: Not addressed in this encounter.  CCM Care Plan  No Known Allergies  Outpatient Encounter Medications as of 06/27/2020  Medication Sig   ACCU-CHEK GUIDE test strip USE AS DIRECTED TO CHECK BLOOD SUGARS 2 TIMES A DAY DX: E11.22   Accu-Chek Softclix Lancets lancets USE AS DIRECTED TO CHECK BLOOD SUGARS 2 TIMES A DAY DX: E11.22   allopurinol (ZYLOPRIM) 100 MG tablet TK 2 TS PO D   amLODipine (NORVASC) 10 MG tablet TAKE 1 TABLET(10 MG) BY MOUTH DAILY   carvedilol (COREG) 6.25 MG tablet TAKE 1 TABLET BY MOUTH TWICE DAILY   ferrous sulfate 325 (65 FE) MG tablet Take 1 tablet (325 mg total) by mouth daily with breakfast.   latanoprost (XALATAN) 0.005 % ophthalmic solution place 1 drop into both eyes at bedtime    MITIGARE 0.6 MG CAPS Take 1 capsule by mouth daily   rosuvastatin (CRESTOR) 10 MG tablet TAKE 1 TABLET(10 MG) BY MOUTH DAILY   Semaglutide,0.25 or 0.'5MG'$ /DOS, (OZEMPIC, 0.25 OR 0.5 MG/DOSE,) 2 MG/1.5ML SOPN INJECT 0.'5MG'$  INTO THE SKIN ONCE A WEEK   timolol (TIMOPTIC-XR) 0.5 % ophthalmic gel-forming Place 1 drop into both eyes daily.   valsartan-hydrochlorothiazide (DIOVAN-HCT) 80-12.5 MG tablet TAKE 1 TABLET BY MOUTH DAILY   vitamin C (ASCORBIC ACID) 500 MG tablet Take 1,000 mg by mouth daily.   Vitamin D, Ergocalciferol, (DRISDOL) 1.25 MG (50000 UNIT) CAPS capsule Take 1 capsule (50,000 Units total) by mouth every 7 (seven) days.   No facility-administered encounter medications on file as of 06/27/2020.    Patient Active Problem List   Diagnosis Date Noted   Achilles tendinitis of right lower extremity 05/22/2020   Great toe pain, left 05/22/2020   Hypertensive nephropathy 01/26/2020   Abnormal ankle brachial index (ABI) 01/26/2020   Adjustment disorder with anxiety 01/26/2020   Atherosclerosis of aorta ([place]) 02/01/2018   Gout 07/02/2016   Anemia of chronic disease 06/07/2016   HLD (hyperlipidemia) 06/02/2016   Glaucoma 06/02/2016   Chronic kidney disease, stage 3, mod decreased GFR ([place]) 06/02/2016   HTN (hypertension) 03/05/2011   Type 2 diabetes mellitus with stage 3 chronic kidney disease ([place]) 03/05/2011    Conditions to be addressed/monitored: DMII and CKD Stage IV ;  Caregiver Stress  Care Plan : Social Work [facility] Care Plan  Updates made by Michelle Cummings since 06/27/2020 12:00 AM  Problem: Caregiver Stress      Long-Range Goal: Caregiver Coping Optimized   Start Date: 04/23/2020  Expected End Date: 08/21/2020  This Visit's Progress: On track  Recent Progress: On track  Priority: High  Note:   Current Barriers:  Chronic disease management support and education needs related to DM and CKD Stage IV   Limited knowledge of caregiver resources  Social Worker Clinical  Goal(s):   patient will work with SW to identify and address any acute and/or chronic care coordination needs related to the self health management of DM and CKD Stage IV    patient will work with SW to identify caregiver respite resources  CCM SW Interventions:  Inter-disciplinary care team collaboration (see longitudinal plan of care) Collaboration with Michelle Chard, MD regarding development and update of comprehensive plan of care as evidenced by provider attestation and co-signature Successful outbound call placed Michelle Cummings with Michelle Cummings to follow up on patient referral for Caregiver Respite Program Discussed the agency is nearing the end of their fiscal year and does not currently have funds to assist with caregiver resources. Michelle Cummings indicates the new fiscal year will begin in July and she will have vouchers to assist Obtained Michelle Cummings's e-mail address ([email or web address]) Provided patient demographic information for referral via secure e-mail correspondence   Patient Goals/Self-Care Activities patient will:   -  Engage with Michelle Cummings as needed regarding Research officer, trade union SW as needed prior to next scheduled call  Follow Up Plan:  SW will follow up with the patient over the next month        Follow Up Plan: SW will follow up with patient by phone over the next month      Michelle Cummings, BSW, CDP Social Worker, Certified Dementia Practitioner [facility] [phone]  Total time spent performing care coordination and/or care management activities with the patient by phone or face to face = 20 minutes.

## 2020-07-09 NOTE — Progress Notes (Signed)
Patient in office today to pick-up diabetic shoes and was seen by EJ with [facility]. Patient tried the shoes on and was satisfied with the fit. Patient was advised of the break-in process at this time. Patient was advised to call the office with any questions, comments, or concerns. Patient verbalized understanding.  

## 2020-07-13 ENCOUNTER — Other Ambulatory Visit: Payer: Self-pay | Admitting: Internal Medicine

## 2020-07-15 ENCOUNTER — Ambulatory Visit: Payer: Medicare PPO | Admitting: Internal Medicine

## 2020-07-17 ENCOUNTER — Telehealth: Payer: Self-pay

## 2020-07-17 ENCOUNTER — Telehealth: Payer: Medicare PPO

## 2020-07-17 ENCOUNTER — Ambulatory Visit (INDEPENDENT_AMBULATORY_CARE_PROVIDER_SITE_OTHER): Payer: Medicare PPO

## 2020-07-17 DIAGNOSIS — E1122 Type 2 diabetes mellitus with diabetic chronic kidney disease: Secondary | ICD-10-CM

## 2020-07-17 DIAGNOSIS — N184 Chronic kidney disease, stage 4 (severe): Secondary | ICD-10-CM

## 2020-07-17 DIAGNOSIS — E7800 Pure hypercholesterolemia, unspecified: Secondary | ICD-10-CM

## 2020-07-17 DIAGNOSIS — I129 Hypertensive chronic kidney disease with stage 1 through stage 4 chronic kidney disease, or unspecified chronic kidney disease: Secondary | ICD-10-CM

## 2020-07-17 DIAGNOSIS — E559 Vitamin D deficiency, unspecified: Secondary | ICD-10-CM

## 2020-07-17 NOTE — Telephone Encounter (Signed)
  Care Management   Follow Up Note   07/17/2020 Name: Michelle Cummings MRN: [id number] DOB: [birth year]   Referred by: Glendale Chard, MD Reason for referral : Chronic Care Management (Unsuccessful call)   An unsuccessful telephone outreach was attempted today. The patient was referred to the case management team for assistance with care management and care coordination. SW left a HIPAA compliant voice message requesting a return call.  Follow Up Plan: The care management team will reach out to the patient again over the next 30 days.   Daneen Schick, BSW, CDP Social Worker, Certified Dementia Practitioner [facility] [phone]

## 2020-07-22 NOTE — Progress Notes (Signed)
This encounter was created in error - please disregard.

## 2020-07-30 NOTE — Patient Instructions (Signed)
Goals Addressed       Other     Follow My Treatment Plan-Chronic Kidney   On track     Timeframe:  Long-Range Goal Priority:  High Start Date:  05/28/20                           Expected End Date:  05/28/21                    Follow Up Date: 11/04/20   Self Care Activities:  Continue to adhere to MD recommendations for CKD  Continue to keep all scheduled follow up appointments Take medications as directed  Let your healthcare team know if you are unable to take your medications Call your pharmacy for refills at least 7 days prior to running out of medication Increase your water intake unless otherwise directed Review mailed printed educational materials related to kidney disease Patient Goals: - maintain and or improve kidney function    Why is this important?   Staying as healthy as you can is very important. This may mean making changes if you smoke, don't exercise or eat poorly.  A healthy lifestyle is an important goal for you.  Following the treatment plan and making changes may be hard.  Try some of these steps to help keep the disease from getting worse.     Notes:       Monitor and Manage My Blood Sugar-Diabetes Type 2   On track     Timeframe:  Long-Range Goal Priority:  Medium Start Date:  05/28/20                           Expected End Date: 05/28/21                    Follow Up Date: 11/04/20    - check blood sugar at prescribed times - enter blood sugar readings and medication or insulin into daily log - take the blood sugar log to all doctor visits - take the blood sugar meter to all doctor visits    Why is this important?   Checking your blood sugar at home helps to keep it from getting very high or very low.  Writing the results in a diary or log helps the doctor know how to care for you.  Your blood sugar log should have the time, date and the results.  Also, write down the amount of insulin or other medicine that you take.  Other information, like what  you ate, exercise done and how you were feeling, will also be helpful.     Notes:       Pure Hypercholesterolemia - treatment optimized   On track     Timeframe:  Long-Range Goal Priority:  High Start Date:  05/28/20                           Expected End Date:  05/28/21  Next Scheduled Follow up date: 11/04/20   Self-Care Activities:  Continue to keep all scheduled follow up appointments Take medications as directed  Let your healthcare team know if you are unable to take your medications Call your pharmacy for refills at least 7 days prior to running out of medication Adhere to dietary and exercise recommendations  Patient Goal:  - to lower Cholesterol levels to optimal levels

## 2020-07-30 NOTE — Chronic Care Management (AMB) (Signed)
Chronic Care Management   CCM RN Visit Note  07/17/2020 Name: Michelle Cummings MRN: [id number] DOB: [birth year]  Subjective: Michelle Cummings is a 70 y.o. year old female who is a primary care patient of Glendale Chard, MD. The care management team was consulted for assistance with disease management and care coordination needs.    Engaged with patient by telephone for follow up visit in response to provider referral for case management and/or care coordination services.   Consent to Services:  The patient was given information about Chronic Care Management services, agreed to services, and gave verbal consent prior to initiation of services.  Please see initial visit note for detailed documentation.   Patient agreed to services and verbal consent obtained.   Assessment: Review of patient past medical history, allergies, medications, health status, including review of consultants reports, laboratory and other test data, was performed as part of comprehensive evaluation and provision of chronic care management services.   SDOH (Social Determinants of Health) assessments and interventions performed:  Yes, no acute challenges   CCM Care Plan  No Known Allergies  Outpatient Encounter Medications as of 07/17/2020  Medication Sig   ACCU-CHEK GUIDE test strip USE AS DIRECTED TO CHECK BLOOD SUGARS 2 TIMES A DAY DX: E11.22   Accu-Chek Softclix Lancets lancets USE AS DIRECTED TO CHECK BLOOD SUGARS 2 TIMES A DAY DX: E11.22   allopurinol (ZYLOPRIM) 100 MG tablet TK 2 TS PO D   amLODipine (NORVASC) 10 MG tablet TAKE 1 TABLET(10 MG) BY MOUTH DAILY   carvedilol (COREG) 6.25 MG tablet TAKE 1 TABLET BY MOUTH TWICE DAILY   ferrous sulfate 325 (65 FE) MG tablet Take 1 tablet (325 mg total) by mouth daily with breakfast.   latanoprost (XALATAN) 0.005 % ophthalmic solution place 1 drop into both eyes at bedtime   MITIGARE 0.6 MG CAPS Take 1 capsule by mouth daily   OZEMPIC, 0.25 OR 0.5 MG/DOSE, 2 MG/1.5ML SOPN  INJECT 0.5MG INTO THE SKIN ONCE A WEEK   rosuvastatin (CRESTOR) 10 MG tablet TAKE 1 TABLET(10 MG) BY MOUTH DAILY   timolol (TIMOPTIC-XR) 0.5 % ophthalmic gel-forming Place 1 drop into both eyes daily.   valsartan-hydrochlorothiazide (DIOVAN-HCT) 80-12.5 MG tablet TAKE 1 TABLET BY MOUTH DAILY   vitamin C (ASCORBIC ACID) 500 MG tablet Take 1,000 mg by mouth daily.   Vitamin D, Ergocalciferol, (DRISDOL) 1.25 MG (50000 UNIT) CAPS capsule Take 1 capsule (50,000 Units total) by mouth every 7 (seven) days.   No facility-administered encounter medications on file as of 07/17/2020.    Patient Active Problem List   Diagnosis Date Noted   Achilles tendinitis of right lower extremity 05/22/2020   Great toe pain, left 05/22/2020   Hypertensive nephropathy 01/26/2020   Abnormal ankle brachial index (ABI) 01/26/2020   Adjustment disorder with anxiety 01/26/2020   Atherosclerosis of aorta ([place]) 02/01/2018   Gout 07/02/2016   Anemia of chronic disease 06/07/2016   HLD (hyperlipidemia) 06/02/2016   Glaucoma 06/02/2016   Chronic kidney disease, stage 3, mod decreased GFR ([place]) 06/02/2016   HTN (hypertension) 03/05/2011   Type 2 diabetes mellitus with stage 3 chronic kidney disease ([place]) 03/05/2011    Conditions to be addressed/monitored: Type II diabetes mellitus with stage 4 chronic kidney disease, Hypertensive nephropathy, Pure Hypercholesterolemia, Vitamin D deficiency   Care Plan : Chronic Kidney (Adult)  Updates made by Lynne Logan, RN since 07/17/2020 12:00 AM     Problem: Disease Progression   Priority: High  Long-Range Goal: Disease Progression Prevented or Minimized   Start Date: 05/28/2020  Expected End Date: 05/28/2021  Recent Progress: On track  Priority: High  Note:   Objective:   Lab Results  Component Value Date   CREATININE 2.21 (H) 04/09/2020   CREATININE 2.18 (H) 01/15/2020   CREATININE 2.43 (H) 10/19/2019   Lab Results  Component Value Date   EGFR 23 (L)  04/09/2020   Current Barriers:  Ineffective Self Health Maintenance  Clinical Goal(s):  Collaboration with Glendale Chard, MD regarding development and update of comprehensive plan of care as evidenced by provider attestation and co-signature Inter-disciplinary care team collaboration (see longitudinal plan of care) patient will work with care management team to address care coordination and chronic disease management needs related to Disease Management Educational Needs Care Coordination Medication Management and Education Psychosocial Support   Interventions:  07/17/20 completed successful outbound call with patient  Evaluation of current treatment plan related to  stage IV CKD , self-management and patient's adherence to plan as established by provider. Collaboration with Glendale Chard, MD regarding development and update of comprehensive plan of care as evidenced by provider attestation       and co-signature Inter-disciplinary care team collaboration (see longitudinal plan of care) Provided education to patient about basic disease process related to Chronic Kidney disease  Review of patient status, including review of consultant's reports, relevant laboratory and other test results, and medications completed. Reviewed medications with patient and discussed importance of medication adherence Educated patient on dietary recommendations, importance of good blood pressure and diabetes control  Educated on importance of increasing water intake to 64 oz daily unless otherwise directed  Mailed printed educational materials related to Eating Right with Chronic Kidney disease  Discussed plans with patient for ongoing care management follow up and provided patient with direct contact information for care management team Self Care Activities:  Continue to adhere to MD recommendations for CKD  Continue to keep all scheduled follow up appointments Take medications as directed  Let your  healthcare team know if you are unable to take your medications Call your pharmacy for refills at least 7 days prior to running out of medication Increase your water intake unless otherwise directed Review mailed printed educational materials related to kidney disease Patient Goals: - maintain and or improve kidney function   Follow Up Plan: Telephone follow up appointment with care management team member scheduled for: 11/04/20    Care Plan : Diabetes Type 2 (Adult)  Updates made by Lynne Logan, RN since 07/17/2020 12:00 AM     Problem: Glycemic Management (Diabetes, Type 2)   Priority: Medium     Long-Range Goal: Glycemic Management Optimized   Start Date: 05/28/2020  Expected End Date: 05/28/2021  Recent Progress: On track  Priority: Medium  Note:   Objective:  Lab Results  Component Value Date   HGBA1C 6.5 (H) 04/09/2020   Lab Results  Component Value Date   CREATININE 2.21 (H) 04/09/2020   CREATININE 2.18 (H) 01/15/2020   CREATININE 2.43 (H) 10/19/2019   Lab Results  Component Value Date   EGFR 23 (L) 04/09/2020   Current Barriers:  Knowledge Deficits related to basic Diabetes pathophysiology and self care/management Knowledge Deficits related to medications used for management of diabetes Case Manager Clinical Goal(s):  patient will demonstrate improved adherence to prescribed treatment plan for diabetes self care/management as evidenced by: daily monitoring and recording of CBG  adherence to ADA/ carb modified diet exercise 5 days/week adherence  to prescribed medication regimen contacting provider for new or worsened symptoms or questions Interventions:  07/17/20 completed successful outbound call with patient  Collaboration with Glendale Chard, MD regarding development and update of comprehensive plan of care as evidenced by provider attestation and co-signature Inter-disciplinary care team collaboration (see longitudinal plan of care) Provided education to  patient about basic DM disease process Review of patient status, including review of consultants reports, relevant laboratory and other test results, and medications completed. Educated patient on dietary and exercise recommendations; daily glycemic control FBS 80-130, <180 after meals;15'15' rule Reviewed medications with patient and discussed importance of medication adherence Advised patient, providing education and rationale, to check cbg daily before meals and at bedtime, before and after exercise and record, calling the PCP and or RNCM for findings outside established parameters.   Mailed printed educational materials related to Diabetes Management  Discussed plans with patient for ongoing care management follow up and provided patient with direct contact information for care management team Self-Care Activities Self administers injectable DM medication (Ozempic) as prescribed Attends all scheduled provider appointments Checks blood sugars as prescribed and utilize hyper and hypoglycemia protocol as needed Adheres to prescribed ADA/carb modified Patient Goals: - check blood sugar at prescribed times - enter blood sugar readings and medication or insulin into daily log - take the blood sugar log to all doctor visits - take the blood sugar meter to all doctor visits - manage portion size - keep appointment with eye doctor - schedule appointment with eye doctor - check feet daily for cuts, sores or redness - keep feet up while sitting - trim toenails straight across - wash and dry feet carefully every day - wear comfortable, cotton socks - wear comfortable, well-fitting shoes Follow Up Plan: Telephone follow up appointment with care management team member scheduled for: 11/04/20    Care Plan : Pure Hypercholesterolemia  Updates made by Lynne Logan, RN since 07/17/2020 12:00 AM     Problem: Pure Hypercholesterolemia   Priority: High     Long-Range Goal: Pure  Hypercholesterolemia - treatment optimized   Start Date: 05/28/2020  Expected End Date: 05/28/2021  Recent Progress: On track  Priority: High  Note:   Current Barriers:  Ineffective Self Health Maintenance  Clinical Goal(s):  Collaboration with Glendale Chard, MD regarding development and update of comprehensive plan of care as evidenced by provider attestation and co-signature Inter-disciplinary care team collaboration (see longitudinal plan of care) patient will work with care management team to address care coordination and chronic disease management needs related to Disease Management Educational Needs Care Coordination Medication Management and Education Psychosocial Support   Interventions:  07/17/20 completed successful outbound call with patient  Evaluation of current treatment plan related to  Pure Hypercholesterolemia , self-management and patient's adherence to plan as established by provider. Collaboration with Glendale Chard, MD regarding development and update of comprehensive plan of care as evidenced by provider attestation       and co-signature Inter-disciplinary care team collaboration (see longitudinal plan of care) Provided education to patient about basic disease process related to Pure Hypercholesterolemia Review of patient status, including review of consultant's reports, relevant laboratory and other test results, and medications completed. Reviewed medications with patient and discussed importance of medication adherence Educated patient on dietary and exercise recommendations Mailed printed educational material related to Managing Cholesterol  Discussed plans with patient for ongoing care management follow up and provided patient with direct contact information for care management team Self-Care Activities:  Continue  to keep all scheduled follow up appointments Take medications as directed  Let your healthcare team know if you are unable to take your  medications Call your pharmacy for refills at least 7 days prior to running out of medication Adhere to dietary and exercise recommendations  Patient Goal:  - to lower Cholesterol levels to optimal levels   Follow Up Plan: Telephone follow up appointment with care management team member scheduled for: 11/04/20    Plan:Telephone follow up appointment with care management team member scheduled for:  11/04/20  Barb Merino, RN, BSN, CCM Care Management Coordinator [facility]  Direct Phone: [phone]

## 2020-08-07 DIAGNOSIS — H401131 Primary open-angle glaucoma, bilateral, mild stage: Secondary | ICD-10-CM | POA: Diagnosis not present

## 2020-08-07 DIAGNOSIS — H2513 Age-related nuclear cataract, bilateral: Secondary | ICD-10-CM | POA: Diagnosis not present

## 2020-08-07 DIAGNOSIS — H524 Presbyopia: Secondary | ICD-10-CM | POA: Diagnosis not present

## 2020-08-07 DIAGNOSIS — E113292 Type 2 diabetes mellitus with mild nonproliferative diabetic retinopathy without macular edema, left eye: Secondary | ICD-10-CM | POA: Diagnosis not present

## 2020-08-07 LAB — HM DIABETES EYE EXAM

## 2020-08-08 ENCOUNTER — Encounter: Payer: Self-pay | Admitting: Internal Medicine

## 2020-08-12 ENCOUNTER — Ambulatory Visit: Payer: Medicare PPO | Admitting: Internal Medicine

## 2020-08-12 ENCOUNTER — Other Ambulatory Visit: Payer: Self-pay

## 2020-08-12 ENCOUNTER — Encounter: Payer: Self-pay | Admitting: Internal Medicine

## 2020-08-12 VITALS — BP 122/70 | HR 64 | Temp 97.5°F | Ht 63.6 in | Wt 149.4 lb

## 2020-08-12 DIAGNOSIS — N184 Chronic kidney disease, stage 4 (severe): Secondary | ICD-10-CM | POA: Diagnosis not present

## 2020-08-12 DIAGNOSIS — E1122 Type 2 diabetes mellitus with diabetic chronic kidney disease: Secondary | ICD-10-CM

## 2020-08-12 DIAGNOSIS — Z23 Encounter for immunization: Secondary | ICD-10-CM | POA: Diagnosis not present

## 2020-08-12 DIAGNOSIS — I129 Hypertensive chronic kidney disease with stage 1 through stage 4 chronic kidney disease, or unspecified chronic kidney disease: Secondary | ICD-10-CM

## 2020-08-12 DIAGNOSIS — Z6825 Body mass index (BMI) 25.0-25.9, adult: Secondary | ICD-10-CM

## 2020-08-12 LAB — BMP8+EGFR
BUN/Creatinine Ratio: 24 (ref 12–28)
BUN: 66 mg/dL — ABNORMAL HIGH (ref 8–27)
CO2: 19 mmol/L — ABNORMAL LOW (ref 20–29)
Calcium: 9.7 mg/dL (ref 8.7–10.3)
Chloride: 97 mmol/L (ref 96–106)
Creatinine, Ser: 2.76 mg/dL — ABNORMAL HIGH (ref 0.57–1.00)
Glucose: 117 mg/dL — ABNORMAL HIGH (ref 65–99)
Potassium: 5.5 mmol/L — ABNORMAL HIGH (ref 3.5–5.2)
Sodium: 132 mmol/L — ABNORMAL LOW (ref 134–144)
eGFR: 18 mL/min/{1.73_m2} — ABNORMAL LOW (ref >59)

## 2020-08-12 LAB — HEMOGLOBIN A1C
Est. average glucose Bld gHb Est-mCnc: 131 mg/dL
Hgb A1c MFr Bld: 6.2 % — ABNORMAL HIGH (ref 4.8–5.6)

## 2020-08-12 NOTE — Progress Notes (Signed)
I,Michelle Cummings as a Education administrator for Michelle Greenland, MD.,have documented all relevant documentation on the behalf of Michelle Greenland, MD,as directed by  Michelle Greenland, MD while in the presence of Michelle Greenland, MD.  This visit occurred during the SARS-CoV-2 public health emergency.  Safety protocols were in place, including screening questions prior to the visit, additional usage of staff PPE, and extensive cleaning of exam room while observing appropriate contact time as indicated for disinfecting solutions.  Subjective:     Patient ID: Michelle Cummings , female    DOB: 31-Oct-1950 , 70 y.o.   MRN: [id number]   Chief Complaint  Patient presents with   Diabetes   Hypertension    HPI  The patient is here today for a diabetes and blood pressure f/u.  She reports compliance with meds. She denies having any headaches, chest pain and palpitations. She states she had her last eye exam a week ago.   Diabetes She presents for her follow-up diabetic visit. She has type 2 diabetes mellitus. Her disease course has been stable. There are no hypoglycemic associated symptoms. Pertinent negatives for diabetes include no blurred vision and no chest pain. There are no hypoglycemic complications. Diabetic complications include nephropathy. Risk factors for coronary artery disease include diabetes mellitus, dyslipidemia, hypertension and post-menopausal. She is following a diabetic diet. She participates in exercise three times a week. Her breakfast blood glucose is taken between 9-10 am. Her breakfast blood glucose range is generally 110-130 mg/dl. An ACE inhibitor/angiotensin II receptor blocker is being taken.  Hypertension This is a chronic problem. The current episode started more than 1 year ago. The problem has been gradually improving since onset. The problem is controlled. Pertinent negatives include no blurred vision or chest pain. Past treatments include angiotensin blockers, diuretics and ACE  inhibitors. The current treatment provides moderate improvement. Hypertensive end-organ damage includes kidney disease.    Past Medical History:  Diagnosis Date   Arthritis    Cataracts, bilateral    Cervical disc disease    Chronic kidney disease    Michelle Cummings yearly for CKD stage II as of 11/26/11   Diabetes mellitus (HCC)    Eczema    Glaucoma    Heart murmur    History of chicken pox    Hyperlipidemia    Hypertension    Seasonal allergies    Thyroid nodule      Family History  Problem Relation Age of Onset   Hypertension Mother    Early death Father    Breast cancer Sister    Healthy Brother      Current Outpatient Medications:    ACCU-CHEK GUIDE test strip, USE AS DIRECTED TO CHECK BLOOD SUGARS 2 TIMES A DAY DX: E11.22, Disp: 100 each, Rfl: 2   Accu-Chek Softclix Lancets lancets, USE AS DIRECTED TO CHECK BLOOD SUGARS 2 TIMES A DAY DX: E11.22, Disp: 100 each, Rfl: 2   allopurinol (ZYLOPRIM) 100 MG tablet, TK 2 TS PO D, Disp: , Rfl:    amLODipine (NORVASC) 10 MG tablet, TAKE 1 TABLET(10 MG) BY MOUTH DAILY, Disp: 90 tablet, Rfl: 1   carvedilol (COREG) 6.25 MG tablet, TAKE 1 TABLET BY MOUTH TWICE DAILY, Disp: 180 tablet, Rfl: 2   ferrous sulfate 325 (65 FE) MG tablet, Take 1 tablet (325 mg total) by mouth daily with breakfast., Disp: 30 tablet, Rfl: 3   latanoprost (XALATAN) 0.005 % ophthalmic solution, place 1 drop into both eyes at bedtime, Disp: ,  Rfl:    MITIGARE 0.6 MG CAPS, Take 1 capsule by mouth daily, Disp: 90 capsule, Rfl: 0   OZEMPIC, 0.25 OR 0.5 MG/DOSE, 2 MG/1.5ML SOPN, INJECT 0.5MG INTO THE SKIN ONCE A WEEK, Disp: 4.5 mL, Rfl: 1   timolol (TIMOPTIC-XR) 0.5 % ophthalmic gel-forming, Place 1 drop into both eyes daily., Disp: , Rfl:    valsartan-hydrochlorothiazide (DIOVAN-HCT) 80-12.5 MG tablet, TAKE 1 TABLET BY MOUTH DAILY, Disp: 90 tablet, Rfl: 2   vitamin C (ASCORBIC ACID) 500 MG tablet, Take 1,000 mg by mouth daily., Disp: , Rfl:    Vitamin D,  Ergocalciferol, (DRISDOL) 1.25 MG (50000 UNIT) CAPS capsule, Take 1 capsule (50,000 Units total) by mouth every 7 (seven) days., Disp: 12 capsule, Rfl: 1   No Known Allergies   Review of Systems  Constitutional: Negative.   Eyes: Negative.  Negative for blurred vision.  Respiratory: Negative.    Cardiovascular: Negative.  Negative for chest pain.  Gastrointestinal: Negative.   Skin: Negative.   Neurological: Negative.   Psychiatric/Behavioral: Negative.      Today's Vitals   08/12/20 0955  BP: 122/70  Pulse: 64  Temp: (!) 97.5 F (36.4 C)  Weight: 149 lb 6.4 oz (67.8 kg)  Height: 5' 3.6" (1.615 m)  PainSc: 0-No pain   Body mass index is 25.97 kg/m.  Wt Readings from Last 3 Encounters:  08/12/20 149 lb 6.4 oz (67.8 kg)  05/22/20 146 lb (66.2 kg)  04/09/20 144 lb 12.8 oz (65.7 kg)     Objective:  Physical Exam Vitals and nursing note reviewed.  Constitutional:      Appearance: Normal appearance.  HENT:     Head: Normocephalic and atraumatic.     Nose:     Comments: Masked     Mouth/Throat:     Comments: Masked  Cardiovascular:     Rate and Rhythm: Normal rate and regular rhythm.     Heart sounds: Normal heart sounds.  Pulmonary:     Effort: Pulmonary effort is normal.     Breath sounds: Normal breath sounds.  Skin:    General: Skin is warm.  Neurological:     General: No focal deficit present.     Mental Status: She is alert.  Psychiatric:        Mood and Affect: Mood normal.        Behavior: Behavior normal.        Assessment And Plan:     1. Type 2 diabetes mellitus with stage 4 chronic kidney disease, without long-term current use of insulin (HCC) Comments: Chronic, I will check labs as listed below. I will adjust meds as needed. She is encouraged to f/u with Renal as scheduled.  - BMP8+eGFR - Hemoglobin A1c  2. Hypertensive nephropathy Comments: Chronic, well controlled. She is encouraged to follow low sodium diet.   3. BMI  25.0-25.9,adult Comments: Her BMI is stable. She is encouraged to aim for at least 150 minutes of exercise per week.   4. Immunization due Comments: She is encouraged to go to pharmacy to get second Shingrix vaccine. The original injection was August 2021.    Patient was given opportunity to ask questions. Patient verbalized understanding of the plan and was able to repeat key elements of the plan. All questions were answered to their satisfaction.   I, Michelle Greenland, MD, have reviewed all documentation for this visit. The documentation on 08/12/20 for the exam, diagnosis, procedures, and orders are all accurate and complete.  IF YOU HAVE BEEN REFERRED TO A SPECIALIST, IT MAY TAKE 1-2 WEEKS TO SCHEDULE/PROCESS THE REFERRAL. IF YOU HAVE NOT HEARD FROM US/SPECIALIST IN TWO WEEKS, PLEASE GIVE [place] A CALL AT [phone].   THE PATIENT IS ENCOURAGED TO PRACTICE SOCIAL DISTANCING DUE TO THE COVID-19 PANDEMIC.

## 2020-08-12 NOTE — Patient Instructions (Signed)

## 2020-08-15 ENCOUNTER — Other Ambulatory Visit: Payer: Self-pay

## 2020-08-15 ENCOUNTER — Encounter: Payer: Self-pay | Admitting: Sports Medicine

## 2020-08-15 ENCOUNTER — Ambulatory Visit: Payer: Medicare PPO | Admitting: Sports Medicine

## 2020-08-15 DIAGNOSIS — M79675 Pain in left toe(s): Secondary | ICD-10-CM

## 2020-08-15 DIAGNOSIS — B351 Tinea unguium: Secondary | ICD-10-CM

## 2020-08-15 DIAGNOSIS — L84 Corns and callosities: Secondary | ICD-10-CM

## 2020-08-15 DIAGNOSIS — M2141 Flat foot [pes planus] (acquired), right foot: Secondary | ICD-10-CM

## 2020-08-15 DIAGNOSIS — M79674 Pain in right toe(s): Secondary | ICD-10-CM

## 2020-08-15 DIAGNOSIS — M2142 Flat foot [pes planus] (acquired), left foot: Secondary | ICD-10-CM

## 2020-08-15 DIAGNOSIS — E119 Type 2 diabetes mellitus without complications: Secondary | ICD-10-CM

## 2020-08-15 DIAGNOSIS — M2042 Other hammer toe(s) (acquired), left foot: Secondary | ICD-10-CM

## 2020-08-15 DIAGNOSIS — M2041 Other hammer toe(s) (acquired), right foot: Secondary | ICD-10-CM

## 2020-08-15 NOTE — Progress Notes (Signed)
Subjective: Michelle Cummings is a 70 y.o. female patient with history of diabetes who presents to office today complaining of long,mildly painful nails and callus while ambulating in shoes; unable to trim. Reports that  she needs new diabetic shoes. No other issues noted.  FBS not recorded  A1c not recorded    Patient Active Problem List   Diagnosis Date Noted   Achilles tendinitis of right lower extremity 05/22/2020   Great toe pain, left 05/22/2020   Hypertensive nephropathy 01/26/2020   Abnormal ankle brachial index (ABI) 01/26/2020   Adjustment disorder with anxiety 01/26/2020   Atherosclerosis of aorta ([place]) 02/01/2018   Gout 07/02/2016   Anemia of chronic disease 06/07/2016   HLD (hyperlipidemia) 06/02/2016   Glaucoma 06/02/2016   Chronic kidney disease, stage 3, mod decreased GFR (HCC) 06/02/2016   HTN (hypertension) 03/05/2011   Type 2 diabetes mellitus with stage 3 chronic kidney disease ([place]) 03/05/2011   Current Outpatient Medications on File Prior to Visit  Medication Sig Dispense Refill   ACCU-CHEK GUIDE test strip USE AS DIRECTED TO CHECK BLOOD SUGARS 2 TIMES A DAY DX: E11.22 100 each 2   Accu-Chek Softclix Lancets lancets USE AS DIRECTED TO CHECK BLOOD SUGARS 2 TIMES A DAY DX: E11.22 100 each 2   allopurinol (ZYLOPRIM) 100 MG tablet TK 2 TS PO D     amLODipine (NORVASC) 10 MG tablet TAKE 1 TABLET(10 MG) BY MOUTH DAILY 90 tablet 1   carvedilol (COREG) 6.25 MG tablet TAKE 1 TABLET BY MOUTH TWICE DAILY 180 tablet 2   ferrous sulfate 325 (65 FE) MG tablet Take 1 tablet (325 mg total) by mouth daily with breakfast. 30 tablet 3   latanoprost (XALATAN) 0.005 % ophthalmic solution place 1 drop into both eyes at bedtime     MITIGARE 0.6 MG CAPS Take 1 capsule by mouth daily 90 capsule 0   OZEMPIC, 0.25 OR 0.5 MG/DOSE, 2 MG/1.5ML SOPN INJECT 0.5MG INTO THE SKIN ONCE A WEEK 4.5 mL 1   timolol (TIMOPTIC-XR) 0.5 % ophthalmic gel-forming Place 1 drop into both eyes daily.      valsartan-hydrochlorothiazide (DIOVAN-HCT) 80-12.5 MG tablet TAKE 1 TABLET BY MOUTH DAILY 90 tablet 2   vitamin C (ASCORBIC ACID) 500 MG tablet Take 1,000 mg by mouth daily.     Vitamin D, Ergocalciferol, (DRISDOL) 1.25 MG (50000 UNIT) CAPS capsule Take 1 capsule (50,000 Units total) by mouth every 7 (seven) days. 12 capsule 1   No current facility-administered medications on file prior to visit.   No Known Allergies  Recent Results (from the past 2160 hour(s))  HM DIABETES EYE EXAM     Status: Abnormal   Collection Time: 08/07/20 12:00 AM  Result Value Ref Range   HM Diabetic Eye Exam Retinopathy (A) No Retinopathy  BMP8+eGFR     Status: Abnormal   Collection Time: 08/12/20 10:48 AM  Result Value Ref Range   Glucose 117 (H) 65 - 99 mg/dL   BUN 66 (H) 8 - 27 mg/dL   Creatinine, Ser 2.76 (H) 0.57 - 1.00 mg/dL   eGFR 18 (L) >59 mL/min/1.73   BUN/Creatinine Ratio 24 12 - 28   Sodium 132 (L) 134 - 144 mmol/L   Potassium 5.5 (H) 3.5 - 5.2 mmol/L   Chloride 97 96 - 106 mmol/L   CO2 19 (L) 20 - 29 mmol/L   Calcium 9.7 8.7 - 10.3 mg/dL  Hemoglobin A1c     Status: Abnormal   Collection Time: 08/12/20 10:48 AM  Result Value Ref Range   Hgb A1c MFr Bld 6.2 (H) 4.8 - 5.6 %    Comment:          Prediabetes: 5.7 - 6.4          Diabetes: >6.4          Glycemic control for adults with diabetes: <7.0    Est. average glucose Bld gHb Est-mCnc 131 mg/dL    Objective: General: Patient is awake, alert, and oriented x 3 and in no acute distress.  Integument: Skin is warm, dry and supple bilateral. Nails are tender, long, thickened and dystrophic with minimal subungual debris, consistent with onychomycosis, 1-5 bilateral. No signs of infection.  Callus submet 5 present bilateral. Remaining integument unremarkable.  Vasculature:  Dorsalis Pedis pulse 2/4 bilateral. Posterior Tibial pulse 1/4 bilateral.  Capillary fill time <3 sec 1-5 bilateral. Positive hair growth to the level of the  digits. Temperature gradient within normal limits. No varicosities present bilateral. No edema present bilateral.   Neurology: The patient has intact sensation measured with a 5.07/10g Semmes Weinstein Monofilament at all pedal sites bilateral . Vibratory sensation intact bilateral with tuning fork. No Babinski sign present bilateral.   Musculoskeletal: Asymptomatic hammertoe and pes planus and prominent fifth metatarsal head at area of callus with minimal metatarsalgia pedal deformities noted bilateral. Muscular strength 5/5 in all lower extremity muscular groups bilateral without pain on range of motion . No tenderness with calf compression bilateral.  Assessment and Plan: Problem List Items Addressed This Visit   None Visit Diagnoses     Pain due to onychomycosis of toenails of both feet    -  Primary   Diabetes mellitus without complication (HCC)       Callus       Hammer toes of both feet       Pes planus of both feet           -Examined patient. -Re-Discussed and educated patient on diabetic foot care, especially with regards to the vascular, neurological and musculoskeletal systems.  -Mechanically debrided all nails 1-5 bilateral using sterile nail nipper and filed with dremel without incident  -Mechanically debrided callus using a sterile chisel blade bilateral submet back without incident -Continue with diabetic shoes; patient to get appt to be measured for a new pair of shoes for the year with our orthotist  -Answered all patient questions -Patient to return  in 3 months for at risk foot care  Landis Martins, DPM

## 2020-08-16 ENCOUNTER — Ambulatory Visit (INDEPENDENT_AMBULATORY_CARE_PROVIDER_SITE_OTHER): Payer: Medicare PPO

## 2020-08-16 DIAGNOSIS — E1122 Type 2 diabetes mellitus with diabetic chronic kidney disease: Secondary | ICD-10-CM | POA: Diagnosis not present

## 2020-08-16 DIAGNOSIS — N184 Chronic kidney disease, stage 4 (severe): Secondary | ICD-10-CM | POA: Diagnosis not present

## 2020-08-16 DIAGNOSIS — I129 Hypertensive chronic kidney disease with stage 1 through stage 4 chronic kidney disease, or unspecified chronic kidney disease: Secondary | ICD-10-CM | POA: Diagnosis not present

## 2020-08-16 NOTE — Chronic Care Management (AMB) (Signed)
Chronic Care Management    Social Work Note  08/16/2020 Name: Michelle Cummings MRN: [id number] DOB: July 13, 1950  Michelle Cummings is a 70 y.o. year old female who is a primary care patient of Glendale Chard, MD. The CCM team was consulted to assist the patient with chronic disease management and/or care coordination needs related to: Caregiver Stress.   Engaged with patient by telephone for follow up visit in response to provider referral for social work chronic care management and care coordination services.   Consent to Services:  The patient was given information about Chronic Care Management services, agreed to services, and gave verbal consent prior to initiation of services.  Please see initial visit note for detailed documentation.   Patient agreed to services and consent obtained.   Assessment: Review of patient past medical history, allergies, medications, and health status, including review of relevant consultants reports was performed today as part of a comprehensive evaluation and provision of chronic care management and care coordination services.     SDOH (Social Determinants of Health) assessments and interventions performed:    Advanced Directives Status: Not addressed in this encounter.  CCM Care Plan  No Known Allergies  Outpatient Encounter Medications as of 08/16/2020  Medication Sig   ACCU-CHEK GUIDE test strip USE AS DIRECTED TO CHECK BLOOD SUGARS 2 TIMES A DAY DX: E11.22   Accu-Chek Softclix Lancets lancets USE AS DIRECTED TO CHECK BLOOD SUGARS 2 TIMES A DAY DX: E11.22   allopurinol (ZYLOPRIM) 100 MG tablet TK 2 TS PO D   amLODipine (NORVASC) 10 MG tablet TAKE 1 TABLET(10 MG) BY MOUTH DAILY   carvedilol (COREG) 6.25 MG tablet TAKE 1 TABLET BY MOUTH TWICE DAILY   ferrous sulfate 325 (65 FE) MG tablet Take 1 tablet (325 mg total) by mouth daily with breakfast.   latanoprost (XALATAN) 0.005 % ophthalmic solution place 1 drop into both eyes at bedtime   MITIGARE 0.6 MG CAPS  Take 1 capsule by mouth daily   OZEMPIC, 0.25 OR 0.5 MG/DOSE, 2 MG/1.5ML SOPN INJECT 0.'5MG'$  INTO THE SKIN ONCE A WEEK   timolol (TIMOPTIC-XR) 0.5 % ophthalmic gel-forming Place 1 drop into both eyes daily.   valsartan-hydrochlorothiazide (DIOVAN-HCT) 80-12.5 MG tablet TAKE 1 TABLET BY MOUTH DAILY   vitamin C (ASCORBIC ACID) 500 MG tablet Take 1,000 mg by mouth daily.   Vitamin D, Ergocalciferol, (DRISDOL) 1.25 MG (50000 UNIT) CAPS capsule Take 1 capsule (50,000 Units total) by mouth every 7 (seven) days.   No facility-administered encounter medications on file as of 08/16/2020.    Patient Active Problem List   Diagnosis Date Noted   Achilles tendinitis of right lower extremity 05/22/2020   Great toe pain, left 05/22/2020   Hypertensive nephropathy 01/26/2020   Abnormal ankle brachial index (ABI) 01/26/2020   Adjustment disorder with anxiety 01/26/2020   Atherosclerosis of aorta ([place]) 02/01/2018   Gout 07/02/2016   Anemia of chronic disease 06/07/2016   HLD (hyperlipidemia) 06/02/2016   Glaucoma 06/02/2016   Chronic kidney disease, stage 3, mod decreased GFR ([place]) 06/02/2016   HTN (hypertension) 03/05/2011   Type 2 diabetes mellitus with stage 3 chronic kidney disease ([place]) 03/05/2011    Conditions to be addressed/monitored: DMII and CKD Stage IV  Care Plan : Social Work [place]  Updates made by Daneen Schick since 08/16/2020 12:00 AM     Problem: Caregiver Stress      Long-Range Goal: Caregiver Coping Optimized   Start Date: 04/23/2020  Recent Progress: On  track  Priority: High  Note:   Current Barriers:  Chronic disease management support and education needs related to DM and CKD Stage IV   Limited knowledge of caregiver resources  Social Worker Clinical Goal(s):   patient will work with SW to identify and address any acute and/or chronic care coordination needs related to the self health management of DM and CKD Stage IV    patient will work with SW to identify  caregiver respite resources  CCM SW Interventions:  Inter-disciplinary care team collaboration (see longitudinal plan of care) Collaboration with Glendale Chard, MD regarding development and update of comprehensive plan of care as evidenced by provider attestation and co-signature Successful outbound call placed to the patient to assess goal progression Discussed the patient has been in contact with [facility] and has been placed on the wait list for the caregiver respite program Assessed for care coordination needs- no acute challenges identified at this time Scheduled follow up call over the next 90 days  Patient Goals/Self-Care Activities patient will:   -  Museum/gallery conservator with Tax adviser as needed regarding [place] as needed prior to next scheduled call  Follow Up Plan:  SW will follow up with the patient over the next 90 days        Follow Up Plan: SW will follow up with patient by phone over the next 90 days      Daneen Schick, BSW, CDP Social Worker, Certified Dementia Practitioner [place] [facility] [place] [facility] [phone]

## 2020-08-16 NOTE — Patient Instructions (Signed)
Social Worker Visit Information  Goals we discussed today:   Goals Addressed             This Visit's Progress    Caregiver Coping Optimized       Timeframe:  Long-Range Goal Priority:  High Start Date:    4.19.22                                             Next planned outreach: 11.3.22  Patient Goals/Self-Care Activities  patient will:  Engage with Tax adviser as needed regarding Research officer, trade union SW as needed prior to next scheduled call         Follow Up Plan: SW will follow up with patient by phone over the next 90 days   Daneen Schick, BSW, CDP Social Worker, Certified Dementia Practitioner [place] [facility] [place] [facility] [phone]

## 2020-08-19 ENCOUNTER — Other Ambulatory Visit: Payer: Medicare PPO

## 2020-09-13 DIAGNOSIS — Z79899 Other long term (current) drug therapy: Secondary | ICD-10-CM | POA: Diagnosis not present

## 2020-09-13 DIAGNOSIS — M109 Gout, unspecified: Secondary | ICD-10-CM | POA: Diagnosis not present

## 2020-09-13 DIAGNOSIS — E119 Type 2 diabetes mellitus without complications: Secondary | ICD-10-CM | POA: Diagnosis not present

## 2020-09-13 DIAGNOSIS — H409 Unspecified glaucoma: Secondary | ICD-10-CM | POA: Diagnosis not present

## 2020-09-13 DIAGNOSIS — I10 Essential (primary) hypertension: Secondary | ICD-10-CM | POA: Diagnosis not present

## 2020-09-13 DIAGNOSIS — Z6825 Body mass index (BMI) 25.0-25.9, adult: Secondary | ICD-10-CM | POA: Diagnosis not present

## 2020-09-13 DIAGNOSIS — E785 Hyperlipidemia, unspecified: Secondary | ICD-10-CM | POA: Diagnosis not present

## 2020-09-13 DIAGNOSIS — E663 Overweight: Secondary | ICD-10-CM | POA: Diagnosis not present

## 2020-09-13 DIAGNOSIS — Z636 Dependent relative needing care at home: Secondary | ICD-10-CM | POA: Diagnosis not present

## 2020-09-24 ENCOUNTER — Other Ambulatory Visit: Payer: Self-pay | Admitting: Internal Medicine

## 2020-09-25 DIAGNOSIS — E875 Hyperkalemia: Secondary | ICD-10-CM | POA: Diagnosis not present

## 2020-09-25 DIAGNOSIS — I129 Hypertensive chronic kidney disease with stage 1 through stage 4 chronic kidney disease, or unspecified chronic kidney disease: Secondary | ICD-10-CM | POA: Diagnosis not present

## 2020-09-25 DIAGNOSIS — E1122 Type 2 diabetes mellitus with diabetic chronic kidney disease: Secondary | ICD-10-CM | POA: Diagnosis not present

## 2020-09-25 DIAGNOSIS — N183 Chronic kidney disease, stage 3 unspecified: Secondary | ICD-10-CM | POA: Diagnosis not present

## 2020-09-25 DIAGNOSIS — M109 Gout, unspecified: Secondary | ICD-10-CM | POA: Diagnosis not present

## 2020-10-03 ENCOUNTER — Ambulatory Visit: Payer: Medicare PPO | Admitting: Internal Medicine

## 2020-10-03 ENCOUNTER — Other Ambulatory Visit: Payer: Self-pay

## 2020-10-03 ENCOUNTER — Encounter: Payer: Self-pay | Admitting: Internal Medicine

## 2020-10-03 VITALS — BP 140/70 | HR 70 | Temp 97.6°F | Ht 63.4 in | Wt 149.8 lb

## 2020-10-03 DIAGNOSIS — N184 Chronic kidney disease, stage 4 (severe): Secondary | ICD-10-CM | POA: Diagnosis not present

## 2020-10-03 DIAGNOSIS — R002 Palpitations: Secondary | ICD-10-CM | POA: Diagnosis not present

## 2020-10-03 DIAGNOSIS — I129 Hypertensive chronic kidney disease with stage 1 through stage 4 chronic kidney disease, or unspecified chronic kidney disease: Secondary | ICD-10-CM

## 2020-10-03 DIAGNOSIS — Z636 Dependent relative needing care at home: Secondary | ICD-10-CM | POA: Diagnosis not present

## 2020-10-03 NOTE — Patient Instructions (Signed)
Palpitations Palpitations are feelings that your heartbeat is not normal. Your heartbeat may feel like it is: Uneven. Faster than normal. Fluttering. Skipping a beat. This is usually not a serious problem. In some cases, you may need tests to rule out any serious problems. Follow these instructions at home: Pay attention to any changes in your condition. Take these actions to help manage your symptoms: Eating and drinking Avoid: Coffee, tea, soft drinks, and energy drinks. Chocolate. Alcohol. Diet pills. Lifestyle  Try to lower your stress. These things can help you relax: Yoga. Deep breathing and meditation. Exercise. Using words and images to create positive thoughts (guided imagery). Using your mind to control things in your body (biofeedback). Do not use drugs. Get plenty of rest and sleep. Keep a regular bed time. General instructions  Take over-the-counter and prescription medicines only as told by your doctor. Do not use any products that contain nicotine or tobacco, such as cigarettes and e-cigarettes. If you need help quitting, ask your doctor. Keep all follow-up visits as told by your doctor. This is important. You may need more tests if palpitations do not go away or get worse. Contact a doctor if: Your symptoms last more than 24 hours. Your symptoms occur more often. Get help right away if you: Have chest pain. Feel short of breath. Have a very bad headache. Feel dizzy. Pass out (faint). Summary Palpitations are feelings that your heartbeat is uneven or faster than normal. It may feel like your heart is fluttering or skipping a beat. Avoid food and drinks that may cause palpitations. These include caffeine, chocolate, and alcohol. Try to lower your stress. Do not smoke or use drugs. Get help right away if you faint or have chest pain, shortness of breath, a severe headache, or dizziness. This information is not intended to replace advice given to you by your  health care provider. Make sure you discuss any questions you have with your health care provider. Document Revised: 04/19/2020 Document Reviewed: 02/03/2017 Elsevier Patient Education  2022 [employer]  

## 2020-10-03 NOTE — Progress Notes (Signed)
I,Tianna Badgett,acting as a Education administrator for [name], MD.,have documented all relevant documentation on the behalf of [name], MD,as directed by  [name], MD while in the presence of [name], MD.  This visit occurred during the SARS-CoV-2 public health emergency.  Safety protocols were in place, including screening questions prior to the visit, additional usage of staff PPE, and extensive cleaning of exam room while observing appropriate contact time as indicated for disinfecting solutions.  Subjective:     Patient ID: Michelle Cummings , female    DOB: 1950/04/06 , 70 y.o.   MRN: [id number]   Chief Complaint  Patient presents with   Palpitations    HPI  Patient is here for palpitations. She states that this has now resolved but she noticed her symptoms Sunday and Tuesday. States her symptoms started Sunday afer getting out of the shower, she was getting ready for church. There was no associated cp, shortness of breath and diaphoresis. She states her sx lasted about an hour.   Sx recurred on Tuesday shortly after awakening. Again, not sure what may have precipitated her symptoms. She is concerned b/c she is not sure what is triggering her sx. She does admit that she is under a great deal of stress caring for her husband who suffered a stroke last year.   Palpitations  This is a new problem. The current episode started in the past 7 days. The problem occurs intermittently. The problem has been gradually improving. The symptoms are aggravated by stress. Pertinent negatives include no chest pain, coughing, irregular heartbeat, malaise/fatigue, numbness or shortness of breath. She has tried bed rest for the symptoms. The treatment provided mild relief. Risk factors include diabetes mellitus, post menopause, dyslipidemia and stress.    Past Medical History:  Diagnosis Date   Arthritis    Cataracts, bilateral    Cervical disc disease    Chronic kidney disease    Dr.  Hillery Hunter yearly for CKD stage II as of 11/26/11   Diabetes mellitus (HCC)    Eczema    Glaucoma    Heart murmur    History of chicken pox    Hyperlipidemia    Hypertension    Seasonal allergies    Thyroid nodule      Family History  Problem Relation Age of Onset   Hypertension Mother    Early death Father    Breast cancer Sister    Healthy Brother      Current Outpatient Medications:    ACCU-CHEK GUIDE test strip, USE AS DIRECTED TO CHECK BLOOD SUGARS 2 TIMES A DAY DX: E11.22, Disp: 100 each, Rfl: 2   Accu-Chek Softclix Lancets lancets, USE AS DIRECTED TO CHECK BLOOD SUGARS 2 TIMES A DAY DX: E11.22, Disp: 100 each, Rfl: 2   allopurinol (ZYLOPRIM) 100 MG tablet, TK 2 TS PO D, Disp: , Rfl:    amLODipine (NORVASC) 10 MG tablet, TAKE 1 TABLET(10 MG) BY MOUTH DAILY, Disp: 90 tablet, Rfl: 1   carvedilol (COREG) 6.25 MG tablet, TAKE 1 TABLET BY MOUTH TWICE DAILY, Disp: 180 tablet, Rfl: 2   ferrous sulfate 325 (65 FE) MG tablet, Take 1 tablet (325 mg total) by mouth daily with breakfast., Disp: 30 tablet, Rfl: 3   latanoprost (XALATAN) 0.005 % ophthalmic solution, place 1 drop into both eyes at bedtime, Disp: , Rfl:    MITIGARE 0.6 MG CAPS, Take 1 capsule by mouth daily, Disp: 90 capsule, Rfl: 0   OZEMPIC, 0.25  OR 0.5 MG/DOSE, 2 MG/1.5ML SOPN, INJECT 0.'5MG'$  INTO THE SKIN ONCE A WEEK, Disp: 4.5 mL, Rfl: 1   timolol (TIMOPTIC-XR) 0.5 % ophthalmic gel-forming, Place 1 drop into both eyes daily., Disp: , Rfl:    valsartan-hydrochlorothiazide (DIOVAN-HCT) 80-12.5 MG tablet, TAKE 1 TABLET BY MOUTH DAILY, Disp: 90 tablet, Rfl: 2   vitamin C (ASCORBIC ACID) 500 MG tablet, Take 1,000 mg by mouth daily., Disp: , Rfl:    Vitamin D, Ergocalciferol, (DRISDOL) 1.25 MG (50000 UNIT) CAPS capsule, TAKE 1 CAPSULE BY MOUTH EVERY 7 DAYS, Disp: 12 capsule, Rfl: 0   No Known Allergies   Review of Systems  Constitutional: Negative.  Negative for malaise/fatigue.  Respiratory: Negative.  Negative for cough  and shortness of breath.   Cardiovascular:  Positive for palpitations. Negative for chest pain.  Gastrointestinal: Negative.        She also admits to increased belching. Denies abdominal discomfort.   Neurological: Negative.  Negative for numbness.    Today's Vitals   10/03/20 0903  BP: 140/70  Pulse: 70  Temp: 97.6 F (36.4 C)  TempSrc: Oral  Weight: 149 lb 12.8 oz (67.9 kg)  Height: 5' 3.4" (1.61 m)   Body mass index is 26.2 kg/m.  Wt Readings from Last 3 Encounters:  10/03/20 149 lb 12.8 oz (67.9 kg)  08/12/20 149 lb 6.4 oz (67.8 kg)  05/22/20 146 lb (66.2 kg)    Objective:  Physical Exam Vitals and nursing note reviewed.  Constitutional:      Appearance: Normal appearance.  HENT:     Head: Normocephalic and atraumatic.     Nose:     Comments: Masked     Mouth/Throat:     Comments: Masked  Eyes:     Extraocular Movements: Extraocular movements intact.  Cardiovascular:     Rate and Rhythm: Normal rate and regular rhythm.     Heart sounds: Normal heart sounds.  Pulmonary:     Effort: Pulmonary effort is normal.     Breath sounds: Normal breath sounds.  Musculoskeletal:     Cervical back: Normal range of motion.  Skin:    General: Skin is warm.  Neurological:     General: No focal deficit present.     Mental Status: She is alert.  Psychiatric:        Mood and Affect: Mood normal.        Behavior: Behavior normal.        Assessment And Plan:     1. Palpitations Comments: Intermittent, EKG performed, NSR w. RAE HR 66 . I will check labs as listed below .I will refer her to Cardiology for further evaluation. She agrees w/ tx plan. - EKG 12-Lead - CBC no Diff - TSH - Magnesium - Ambulatory referral to Cardiology  2. Hypertensive nephropathy Comments: Chronic, fair control. She is aware goal BP is less than 130/80. Encouraged to decrease intake of packaged foods which tend to be high in sodium.   3. Chronic renal disease, stage IV ([place]) Comments:  Chronic, also followed by Nephrology. Reports having recent visit w/ elevated BP. States her meds were not changed.   4. Caregiver stress Comments: She is encouraged to take some time for herself. She is thinking about hiring PCA to help care for her husband.     Patient was given opportunity to ask questions. Patient verbalized understanding of the plan and was able to repeat key elements of the plan. All questions were answered to their satisfaction.  I, [name], MD, have reviewed all documentation for this visit. The documentation on 10/03/20 for the exam, diagnosis, procedures, and orders are all accurate and complete.   IF YOU HAVE BEEN REFERRED TO A SPECIALIST, IT MAY TAKE 1-2 WEEKS TO SCHEDULE/PROCESS THE REFERRAL. IF YOU HAVE NOT HEARD FROM US/SPECIALIST IN TWO WEEKS, PLEASE GIVE [place] A CALL AT [phone] X [phone].   THE PATIENT IS ENCOURAGED TO PRACTICE SOCIAL DISTANCING DUE TO THE COVID-19 PANDEMIC.

## 2020-10-04 LAB — CBC
Hematocrit: 31 % — ABNORMAL LOW (ref 34.0–46.6)
Hemoglobin: 9.9 g/dL — ABNORMAL LOW (ref 11.1–15.9)
MCH: 27 pg (ref 26.6–33.0)
MCHC: 31.9 g/dL (ref 31.5–35.7)
MCV: 85 fL (ref 79–97)
Platelets: 299 10*3/uL (ref 150–450)
RBC: 3.67 x10E6/uL — ABNORMAL LOW (ref 3.77–5.28)
RDW: 12.8 % (ref 11.7–15.4)
WBC: 6.4 10*3/uL (ref 3.4–10.8)

## 2020-10-04 LAB — TSH: TSH: 2.02 u[IU]/mL (ref 0.450–4.500)

## 2020-10-04 LAB — MAGNESIUM: Magnesium: 2.7 mg/dL — ABNORMAL HIGH (ref 1.6–2.3)

## 2020-10-21 ENCOUNTER — Encounter: Payer: Medicare PPO | Admitting: Internal Medicine

## 2020-10-22 ENCOUNTER — Other Ambulatory Visit: Payer: Self-pay

## 2020-10-22 ENCOUNTER — Encounter: Payer: Self-pay | Admitting: Nurse Practitioner

## 2020-10-22 ENCOUNTER — Ambulatory Visit (INDEPENDENT_AMBULATORY_CARE_PROVIDER_SITE_OTHER): Payer: Medicare PPO | Admitting: Nurse Practitioner

## 2020-10-22 VITALS — BP 128/62 | HR 62 | Temp 98.4°F | Ht 63.4 in | Wt 150.0 lb

## 2020-10-22 DIAGNOSIS — E1122 Type 2 diabetes mellitus with diabetic chronic kidney disease: Secondary | ICD-10-CM | POA: Diagnosis not present

## 2020-10-22 DIAGNOSIS — E7800 Pure hypercholesterolemia, unspecified: Secondary | ICD-10-CM | POA: Diagnosis not present

## 2020-10-22 DIAGNOSIS — K5909 Other constipation: Secondary | ICD-10-CM

## 2020-10-22 DIAGNOSIS — I129 Hypertensive chronic kidney disease with stage 1 through stage 4 chronic kidney disease, or unspecified chronic kidney disease: Secondary | ICD-10-CM

## 2020-10-22 DIAGNOSIS — M25522 Pain in left elbow: Secondary | ICD-10-CM | POA: Diagnosis not present

## 2020-10-22 DIAGNOSIS — N184 Chronic kidney disease, stage 4 (severe): Secondary | ICD-10-CM

## 2020-10-22 DIAGNOSIS — Z6826 Body mass index (BMI) 26.0-26.9, adult: Secondary | ICD-10-CM

## 2020-10-22 DIAGNOSIS — E559 Vitamin D deficiency, unspecified: Secondary | ICD-10-CM | POA: Diagnosis not present

## 2020-10-22 DIAGNOSIS — Z0000 Encounter for general adult medical examination without abnormal findings: Secondary | ICD-10-CM

## 2020-10-22 LAB — POCT UA - MICROALBUMIN
Albumin/Creatinine Ratio, Urine, POC: >300
Creatinine, POC: 50 mg/dL
Microalbumin Ur, POC: 150 mg/L

## 2020-10-22 NOTE — Patient Instructions (Signed)
Health Maintenance, Female Adopting a healthy lifestyle and getting preventive care are important in promoting health and wellness. Ask your health care provider about: The right schedule for you to have regular tests and exams. Things you can do on your own to prevent diseases and keep yourself healthy. What should I know about diet, weight, and exercise? Eat a healthy diet  Eat a diet that includes plenty of vegetables, fruits, low-fat dairy products, and lean protein. Do not eat a lot of foods that are high in solid fats, added sugars, or sodium. Maintain a healthy weight Body mass index (BMI) is used to identify weight problems. It estimates body fat based on height and weight. Your health care provider can help determine your BMI and help you achieve or maintain a healthy weight. Get regular exercise Get regular exercise. This is one of the most important things you can do for your health. Most adults should: Exercise for at least 150 minutes each week. The exercise should increase your heart rate and make you sweat (moderate-intensity exercise). Do strengthening exercises at least twice a week. This is in addition to the moderate-intensity exercise. Spend less time sitting. Even light physical activity can be beneficial. Watch cholesterol and blood lipids Have your blood tested for lipids and cholesterol at 71 years of age, then have this test every 5 years. Have your cholesterol levels checked more often if: Your lipid or cholesterol levels are high. You are older than 71 years of age. You are at high risk for heart disease. What should I know about cancer screening? Depending on your health history and family history, you may need to have cancer screening at various ages. This may include screening for: Breast cancer. Cervical cancer. Colorectal cancer. Skin cancer. Lung cancer. What should I know about heart disease, diabetes, and high blood pressure? Blood pressure and heart  disease High blood pressure causes heart disease and increases the risk of stroke. This is more likely to develop in people who have high blood pressure readings, are of African descent, or are overweight. Have your blood pressure checked: Every 3-5 years if you are 18-39 years of age. Every year if you are 40 years old or older. Diabetes Have regular diabetes screenings. This checks your fasting blood sugar level. Have the screening done: Once every three years after age 40 if you are at a normal weight and have a low risk for diabetes. More often and at a younger age if you are overweight or have a high risk for diabetes. What should I know about preventing infection? Hepatitis B If you have a higher risk for hepatitis B, you should be screened for this virus. Talk with your health care provider to find out if you are at risk for hepatitis B infection. Hepatitis C Testing is recommended for: Everyone born from 1945 through 1965. Anyone with known risk factors for hepatitis C. Sexually transmitted infections (STIs) Get screened for STIs, including gonorrhea and chlamydia, if: You are sexually active and are younger than 71 years of age. You are older than 71 years of age and your health care provider tells you that you are at risk for this type of infection. Your sexual activity has changed since you were last screened, and you are at increased risk for chlamydia or gonorrhea. Ask your health care provider if you are at risk. Ask your health care provider about whether you are at high risk for HIV. Your health care provider may recommend a prescription medicine   to help prevent HIV infection. If you choose to take medicine to prevent HIV, you should first get tested for HIV. You should then be tested every 3 months for as long as you are taking the medicine. Pregnancy If you are about to stop having your period (premenopausal) and you may become pregnant, seek counseling before you get  pregnant. Take 400 to 800 micrograms (mcg) of folic acid every day if you become pregnant. Ask for birth control (contraception) if you want to prevent pregnancy. Osteoporosis and menopause Osteoporosis is a disease in which the bones lose minerals and strength with aging. This can result in bone fractures. If you are 65 years old or older, or if you are at risk for osteoporosis and fractures, ask your health care provider if you should: Be screened for bone loss. Take a calcium or vitamin D supplement to lower your risk of fractures. Be given hormone replacement therapy (HRT) to treat symptoms of menopause. Follow these instructions at home: Lifestyle Do not use any products that contain nicotine or tobacco, such as cigarettes, e-cigarettes, and chewing tobacco. If you need help quitting, ask your health care provider. Do not use street drugs. Do not share needles. Ask your health care provider for help if you need support or information about quitting drugs. Alcohol use Do not drink alcohol if: Your health care provider tells you not to drink. You are pregnant, may be pregnant, or are planning to become pregnant. If you drink alcohol: Limit how much you use to 0-1 drink a day. Limit intake if you are breastfeeding. Be aware of how much alcohol is in your drink. In the [place] one drink equals one 12 oz bottle of beer (355 mL), one 5 oz glass of wine (148 mL), or one 1 oz glass of hard liquor (44 mL). General instructions Schedule regular health, dental, and eye exams. Stay current with your vaccines. Tell your health care provider if: You often feel depressed. You have ever been abused or do not feel safe at home. Summary Adopting a healthy lifestyle and getting preventive care are important in promoting health and wellness. Follow your health care provider's instructions about healthy diet, exercising, and getting tested or screened for diseases. Follow your health care provider's  instructions on monitoring your cholesterol and blood pressure. This information is not intended to replace advice given to you by your health care provider. Make sure you discuss any questions you have with your health care provider. Document Revised: 03/01/2020 Document Reviewed: 12/15/2017 [employer] Patient Education  2022 [employer]  

## 2020-10-22 NOTE — Progress Notes (Signed)
I,Tianna Badgett,acting as a Neurosurgeon for [employer], NP.,have documented all relevant documentation on the behalf of [employer], NP,as directed by  Charlesetta Ivory, NP while in the presence of Charlesetta Ivory, NP.  This visit occurred during the SARS-CoV-2 public health emergency.  Safety protocols were in place, including screening questions prior to the visit, additional usage of staff PPE, and extensive cleaning of exam room while observing appropriate contact time as indicated for disinfecting solutions.  Subjective:     Patient ID: Michelle Cummings , female    DOB: [birth year] , 70 y.o.   MRN: [id number]   Chief Complaint  Patient presents with   Annual Exam    HPI  She is here today for a full physical examination. She is no longer followed by GYN for her pelvic exams. She has no specific concerns or complaints at this time. She denies headaches, chest pain and shortness of breath.  She is complaining of left low elbow.  She has a history of surgery in the that elbow and she is concerned and worried she may have hurt it. She still gets mammogram every year.    Diabetes She presents for her follow-up diabetic visit. She has type 2 diabetes mellitus. Her disease course has been stable. There are no hypoglycemic associated symptoms. Pertinent negatives for hypoglycemia include no headaches. Pertinent negatives for diabetes include no blurred vision, no chest pain, no polydipsia, no polyphagia, no polyuria and no weakness. There are no hypoglycemic complications. Diabetic complications include nephropathy. Risk factors for coronary artery disease include diabetes mellitus, dyslipidemia, hypertension and post-menopausal. She is following a diabetic diet. She participates in exercise three times a week. Her breakfast blood glucose is taken between 9-10 am. Her breakfast blood glucose range is generally 110-130 mg/dl. An ACE inhibitor/angiotensin II receptor blocker is being taken.   Hypertension This is a chronic problem. The current episode started more than 1 year ago. The problem has been gradually improving since onset. The problem is controlled. Pertinent negatives include no blurred vision, chest pain, headaches, palpitations or shortness of breath. Past treatments include angiotensin blockers, diuretics and ACE inhibitors. The current treatment provides moderate improvement. Hypertensive end-organ damage includes kidney disease.    Past Medical History:  Diagnosis Date   Arthritis    Cataracts, bilateral    Cervical disc disease    Chronic kidney disease    Dr. Antoine Poche yearly for CKD stage II as of 11/26/11   Diabetes mellitus (HCC)    Eczema    Glaucoma    Heart murmur    History of chicken pox    Hyperlipidemia    Hypertension    Seasonal allergies    Thyroid nodule      Family History  Problem Relation Age of Onset   Hypertension Mother    Early death Father    Breast cancer Sister    Healthy Brother      Current Outpatient Medications:    ACCU-CHEK GUIDE test strip, USE AS DIRECTED TO CHECK BLOOD SUGARS 2 TIMES A DAY DX: E11.22, Disp: 100 each, Rfl: 2   Accu-Chek Softclix Lancets lancets, USE AS DIRECTED TO CHECK BLOOD SUGARS 2 TIMES A DAY DX: E11.22, Disp: 100 each, Rfl: 2   allopurinol (ZYLOPRIM) 100 MG tablet, TK 2 TS PO D, Disp: , Rfl:    amLODipine (NORVASC) 10 MG tablet, TAKE 1 TABLET(10 MG) BY MOUTH DAILY, Disp: 90 tablet, Rfl: 1   carvedilol (COREG) 6.25 MG tablet, TAKE 1 TABLET BY  MOUTH TWICE DAILY, Disp: 180 tablet, Rfl: 2   ferrous sulfate 325 (65 FE) MG tablet, Take 1 tablet (325 mg total) by mouth daily with breakfast., Disp: 30 tablet, Rfl: 3   latanoprost (XALATAN) 0.005 % ophthalmic solution, place 1 drop into both eyes at bedtime, Disp: , Rfl:    MITIGARE 0.6 MG CAPS, Take 1 capsule by mouth daily, Disp: 90 capsule, Rfl: 0   OZEMPIC, 0.25 OR 0.5 MG/DOSE, 2 MG/1.5ML SOPN, INJECT 0.5MG  INTO THE SKIN ONCE A WEEK, Disp: 4.5  mL, Rfl: 1   timolol (TIMOPTIC-XR) 0.5 % ophthalmic gel-forming, Place 1 drop into both eyes daily., Disp: , Rfl:    valsartan-hydrochlorothiazide (DIOVAN-HCT) 80-12.5 MG tablet, TAKE 1 TABLET BY MOUTH DAILY, Disp: 90 tablet, Rfl: 2   vitamin C (ASCORBIC ACID) 500 MG tablet, Take 1,000 mg by mouth daily., Disp: , Rfl:    Vitamin D, Ergocalciferol, (DRISDOL) 1.25 MG (50000 UNIT) CAPS capsule, TAKE 1 CAPSULE BY MOUTH EVERY 7 DAYS, Disp: 12 capsule, Rfl: 0   No Known Allergies    The patient states she uses none for birth control. Last LMP was No LMP recorded. Patient is postmenopausal.. Negative for Dysmenorrhea. Negative for: breast discharge, breast lump(s), breast pain and breast self exam. Associated symptoms include abnormal vaginal bleeding. Pertinent negatives include abnormal bleeding (hematology), anxiety, decreased libido, depression, difficulty falling sleep, dyspareunia, history of infertility, nocturia, sexual dysfunction, sleep disturbances, urinary incontinence, urinary urgency, vaginal discharge and vaginal itching. Diet regular.The patient states her exercise level is    . The patient's tobacco use is:  Social History   Tobacco Use  Smoking Status Never  Smokeless Tobacco Never  . She has been exposed to passive smoke. The patient's alcohol use is:  Social History   Substance and Sexual Activity  Alcohol Use No  .Review of Systems  Constitutional: Negative.  Negative for chills and fever.  HENT: Negative.  Negative for congestion, sinus pressure and sinus pain.   Eyes: Negative.  Negative for blurred vision.  Respiratory: Negative.  Negative for choking, shortness of breath and wheezing.   Cardiovascular: Negative.  Negative for chest pain and palpitations.  Gastrointestinal:  Positive for constipation. Negative for diarrhea and vomiting.  Endocrine: Negative.  Negative for polydipsia, polyphagia and polyuria.  Genitourinary: Negative.   Musculoskeletal:  Positive for  arthralgias. Negative for myalgias.       Left elbow pain   Skin: Negative.   Allergic/Immunologic: Negative.   Neurological: Negative.  Negative for weakness and headaches.  Hematological: Negative.   Psychiatric/Behavioral: Negative.      Today's Vitals   10/22/20 1116  BP: 128/62  Pulse: 62  Temp: 98.4 F (36.9 C)  TempSrc: Oral  Weight: 150 lb (68 kg)  Height: 5' 3.4" (1.61 m)   Body mass index is 26.24 kg/m.  Wt Readings from Last 3 Encounters:  10/22/20 150 lb (68 kg)  10/03/20 149 lb 12.8 oz (67.9 kg)  08/12/20 149 lb 6.4 oz (67.8 kg)    Objective:  Physical Exam Vitals and nursing note reviewed.  Constitutional:      Appearance: Normal appearance.  HENT:     Head: Normocephalic and atraumatic.     Right Ear: Tympanic membrane, ear canal and external ear normal.     Left Ear: Tympanic membrane, ear canal and external ear normal.     Nose: Nose normal.     Mouth/Throat:     Mouth: Mucous membranes are moist.     Pharynx: Oropharynx  is clear.  Eyes:     Extraocular Movements: Extraocular movements intact.     Conjunctiva/sclera: Conjunctivae normal.     Pupils: Pupils are equal, round, and reactive to light.  Cardiovascular:     Rate and Rhythm: Normal rate and regular rhythm.     Pulses: Normal pulses.     Heart sounds: Normal heart sounds.  Pulmonary:     Effort: Pulmonary effort is normal.     Breath sounds: Normal breath sounds.  Chest:  Breasts:    Tanner Score is 5.     Right: Normal.     Left: Normal.  Abdominal:     General: Abdomen is flat. Bowel sounds are normal.     Palpations: Abdomen is soft.  Genitourinary:    Comments: deferred Musculoskeletal:     Right elbow: No swelling. Normal range of motion. No tenderness.     Left elbow: No swelling. Decreased range of motion. Tenderness present.     Cervical back: Normal range of motion and neck supple.  Skin:    General: Skin is warm and dry.  Neurological:     General: No focal deficit  present.     Mental Status: She is alert and oriented to person, place, and time.  Psychiatric:        Mood and Affect: Mood normal.        Behavior: Behavior normal.        Assessment And Plan:     1. Encounter for annual physical exam --Patient is here for their annual physical exam and we discussed any changes to medication and medical history.  -Behavior modification was discussed as well as diet and exercise history  -Patient will continue to exercise regularly and modify their diet.  -Recommendation for yearly physical annuals, immunization and screenings including mammogram and colonoscopy were discussed with the patient.  -Recommended intake of multivitamin, vitamin D and calcium.  -Individualized advise was given to the patient pertaining to their own health history in regards to diet, exercise, medical condition and referrals.   2. Hypertensive nephropathy -Continue meds, chronic  -Encouraged to incorporate more exercise into her daily routine.  -Limit the intake of processed foods and salt intake. You should increase your intake of green vegetables and fruits. Limit the use of alcohol. Limit fast foods and fried foods. Avoid high fatty saturated and trans fat foods. Keep yourself hydrated with drinking water. Avoid red meats. Eat lean meats instead. Exercise for atleast 30-45 min for atleast 4-5 times a week.  - CBC - CMP14+EGFR - POCT UA - Microalbumin  3. Type 2 diabetes mellitus with stage 4 chronic kidney disease, without long-term current use of insulin (HCC) -Chronic, continue meds  -She continues to take ozempic 0.5 without any SE  - Hemoglobin A1c - CMP14+EGFR  4. Pure hypercholesterolemia -Will check lipid panel and supplement as needed.  -Follow heart healthy diet. Regular exercise; clean diet and increase fiber intake.   -Lipid panel  5. Vitamin D deficiency disease - VITAMIN D 25 Hydroxy (Vit-D Deficiency, Fractures)  6. Chronic renal disease, stage IV  (HCC) -Continue to follow   7. Body mass index (BMI) of 26.0-26.9 in adult -Advised patient on a healthy diet including avoiding fast food and red meats. Increase the intake of lean meats including grilled chicken and [place].  Drink a lot of water. Decrease intake of fatty foods. Exercise for 30-45 min. 4-5 a week to decrease the risk of cardiac event.   8. Left elbow  pain - DG Elbow Complete Left; Future -Encouraged patient to use Voltaren gel as needed.   9. Other constipation -Increase fiber and water intake  -Given samples of linzess..   The patient was encouraged to call or send a message through MyChart for any questions or concerns.   Follow up: if symptoms persist or do not get better.   Side effects and appropriate use of all the medication(s) were discussed with the patient today. Patient advised to use the medication(s) as directed by their healthcare provider. The patient was encouraged to read, review, and understand all associated package inserts and contact our office with any questions or concerns. The patient accepts the risks of the treatment plan and had an opportunity to ask questions.   Staying healthy and adopting a healthy lifestyle for your overall health is important. You should eat 7 or more servings of fruits and vegetables per day. You should drink plenty of water to keep yourself hydrated and your kidneys healthy. This includes about 65-80+ fluid ounces of water. Limit your intake of animal fats especially for elevated cholesterol. Avoid highly processed food and limit your salt intake if you have hypertension. Avoid foods high in saturated/Trans fats. Along with a healthy diet it is also very important to maintain time for yourself to maintain a healthy mental health with low stress levels. You should get atleast 150 min of moderate intensity exercise weekly for a healthy heart. Along with eating right and exercising, aim for at least 7-9 hours of sleep daily.  Eat  more whole grains which includes barley, wheat berries, oats, brown rice and whole wheat pasta. Use healthy plant oils which include olive, soy, corn, sunflower and peanut. Limit your caffeine and sugary drinks. Limit your intake of fast foods. Limit milk and dairy products to one or two daily servings.   Patient was given opportunity to ask questions. Patient verbalized understanding of the plan and was able to repeat key elements of the plan. All questions were answered to their satisfaction.  Raman Gracin Soohoo, DNP   I, Raman Arlan Birks have reviewed all documentation for this visit. The documentation on 10/22/20 for the exam, diagnosis, procedures, and orders are all accurate and complete.  THE PATIENT IS ENCOURAGED TO PRACTICE SOCIAL DISTANCING DUE TO THE COVID-19 PANDEMIC.

## 2020-10-23 LAB — HEMOGLOBIN A1C
Est. average glucose Bld gHb Est-mCnc: 137 mg/dL
Hgb A1c MFr Bld: 6.4 % — ABNORMAL HIGH (ref 4.8–5.6)

## 2020-10-23 LAB — LIPID PANEL
Chol/HDL Ratio: 3 ratio (ref 0.0–4.4)
Cholesterol, Total: 231 mg/dL — ABNORMAL HIGH (ref 100–199)
HDL: 78 mg/dL (ref >39)
LDL Chol Calc (NIH): 137 mg/dL — ABNORMAL HIGH (ref 0–99)
Triglycerides: 90 mg/dL (ref 0–149)
VLDL Cholesterol Cal: 16 mg/dL (ref 5–40)

## 2020-10-23 LAB — VITAMIN D 25 HYDROXY (VIT D DEFICIENCY, FRACTURES): Vit D, 25-Hydroxy: 39 ng/mL (ref 30.0–100.0)

## 2020-10-23 LAB — CBC
Hematocrit: 30.7 % — ABNORMAL LOW (ref 34.0–46.6)
Hemoglobin: 9.9 g/dL — ABNORMAL LOW (ref 11.1–15.9)
MCH: 27.5 pg (ref 26.6–33.0)
MCHC: 32.2 g/dL (ref 31.5–35.7)
MCV: 85 fL (ref 79–97)
Platelets: 288 10*3/uL (ref 150–450)
RBC: 3.6 x10E6/uL — ABNORMAL LOW (ref 3.77–5.28)
RDW: 13.2 % (ref 11.7–15.4)
WBC: 8.5 10*3/uL (ref 3.4–10.8)

## 2020-10-23 LAB — CMP14+EGFR
ALT: 22 IU/L (ref 0–32)
AST: 16 IU/L (ref 0–40)
Albumin/Globulin Ratio: 1.7 (ref 1.2–2.2)
Albumin: 4.7 g/dL (ref 3.7–4.7)
Alkaline Phosphatase: 101 IU/L (ref 44–121)
BUN/Creatinine Ratio: 27 (ref 12–28)
BUN: 60 mg/dL — ABNORMAL HIGH (ref 8–27)
Bilirubin Total: 0.2 mg/dL (ref 0.0–1.2)
CO2: 19 mmol/L — ABNORMAL LOW (ref 20–29)
Calcium: 9.5 mg/dL (ref 8.7–10.3)
Chloride: 98 mmol/L (ref 96–106)
Creatinine, Ser: 2.22 mg/dL — ABNORMAL HIGH (ref 0.57–1.00)
Globulin, Total: 2.7 g/dL (ref 1.5–4.5)
Glucose: 105 mg/dL — ABNORMAL HIGH (ref 70–99)
Potassium: 5 mmol/L (ref 3.5–5.2)
Sodium: 135 mmol/L (ref 134–144)
Total Protein: 7.4 g/dL (ref 6.0–8.5)
eGFR: 23 mL/min/{1.73_m2} — ABNORMAL LOW (ref >59)

## 2020-10-24 ENCOUNTER — Other Ambulatory Visit: Payer: Self-pay | Admitting: Internal Medicine

## 2020-10-30 ENCOUNTER — Telehealth: Payer: Self-pay | Admitting: Internal Medicine

## 2020-10-30 NOTE — Telephone Encounter (Signed)
Left message for patient to call back and schedule Medicare Annual Wellness Visit (AWV) either virtually or in office.  Left both my jabber number [phone] and office number    Last AWV ;11/23/19  please schedule at anytime with [facility]    This should be a 45 minute visit.

## 2020-10-31 ENCOUNTER — Other Ambulatory Visit: Payer: Self-pay | Admitting: Internal Medicine

## 2020-10-31 ENCOUNTER — Encounter: Payer: Medicare PPO | Admitting: Internal Medicine

## 2020-11-04 ENCOUNTER — Telehealth: Payer: Medicare PPO

## 2020-11-04 ENCOUNTER — Ambulatory Visit (INDEPENDENT_AMBULATORY_CARE_PROVIDER_SITE_OTHER): Payer: Medicare PPO

## 2020-11-04 DIAGNOSIS — I129 Hypertensive chronic kidney disease with stage 1 through stage 4 chronic kidney disease, or unspecified chronic kidney disease: Secondary | ICD-10-CM | POA: Diagnosis not present

## 2020-11-04 DIAGNOSIS — E7800 Pure hypercholesterolemia, unspecified: Secondary | ICD-10-CM

## 2020-11-04 DIAGNOSIS — E559 Vitamin D deficiency, unspecified: Secondary | ICD-10-CM

## 2020-11-04 DIAGNOSIS — E1122 Type 2 diabetes mellitus with diabetic chronic kidney disease: Secondary | ICD-10-CM

## 2020-11-04 DIAGNOSIS — N184 Chronic kidney disease, stage 4 (severe): Secondary | ICD-10-CM | POA: Diagnosis not present

## 2020-11-05 NOTE — Chronic Care Management (AMB) (Signed)
Chronic Care Management   CCM RN Visit Note  11/04/2020 Name: Michelle Cummings MRN: [id number] DOB: 12/31/1950  Subjective: Michelle Cummings is a 70 y.o. year old female who is a primary care patient of Glendale Chard, MD. The care management team was consulted for assistance with disease management and care coordination needs.    Engaged with patient by telephone for follow up visit in response to provider referral for case management and/or care coordination services.   Consent to Services:  The patient was given information about Chronic Care Management services, agreed to services, and gave verbal consent prior to initiation of services.  Please see initial visit note for detailed documentation.   Patient agreed to services and verbal consent obtained.   Assessment: Review of patient past medical history, allergies, medications, health status, including review of consultants reports, laboratory and other test data, was performed as part of comprehensive evaluation and provision of chronic care management services.   SDOH (Social Determinants of Health) assessments and interventions performed:  Yes, no acute challenges   CCM Care Plan  No Known Allergies  Outpatient Encounter Medications as of 11/04/2020  Medication Sig   ACCU-CHEK GUIDE test strip USE AS DIRECTED TO CHECK BLOOD SUGARS 2 TIMES A DAY DX: E11.22   Accu-Chek Softclix Lancets lancets USE AS DIRECTED TO CHECK BLOOD SUGARS 2 TIMES A DAY DX: E11.22   allopurinol (ZYLOPRIM) 100 MG tablet TK 2 TS PO D   amLODipine (NORVASC) 10 MG tablet TAKE 1 TABLET(10 MG) BY MOUTH DAILY   carvedilol (COREG) 6.25 MG tablet TAKE 1 TABLET BY MOUTH TWICE DAILY   ferrous sulfate 325 (65 FE) MG tablet Take 1 tablet (325 mg total) by mouth daily with breakfast.   latanoprost (XALATAN) 0.005 % ophthalmic solution place 1 drop into both eyes at bedtime   MITIGARE 0.6 MG CAPS Take 1 capsule by mouth daily   OZEMPIC, 0.25 OR 0.5 MG/DOSE, 2 MG/1.5ML SOPN  INJECT 0.5MG INTO THE SKIN ONCE A WEEK   timolol (TIMOPTIC-XR) 0.5 % ophthalmic gel-forming Place 1 drop into both eyes daily.   valsartan-hydrochlorothiazide (DIOVAN-HCT) 80-12.5 MG tablet TAKE 1 TABLET BY MOUTH DAILY   vitamin C (ASCORBIC ACID) 500 MG tablet Take 1,000 mg by mouth daily.   Vitamin D, Ergocalciferol, (DRISDOL) 1.25 MG (50000 UNIT) CAPS capsule TAKE 1 CAPSULE BY MOUTH EVERY 7 DAYS   No facility-administered encounter medications on file as of 11/04/2020.    Patient Active Problem List   Diagnosis Date Noted   Achilles tendinitis of right lower extremity 05/22/2020   Great toe pain, left 05/22/2020   Hypertensive nephropathy 01/26/2020   Abnormal ankle brachial index (ABI) 01/26/2020   Adjustment disorder with anxiety 01/26/2020   Atherosclerosis of aorta ([place]) 02/01/2018   Gout 07/02/2016   Anemia of chronic disease 06/07/2016   HLD (hyperlipidemia) 06/02/2016   Glaucoma 06/02/2016   Chronic kidney disease, stage 3, mod decreased GFR ([place]) 06/02/2016   HTN (hypertension) 03/05/2011   Type 2 diabetes mellitus with stage 3 chronic kidney disease ([place]) 03/05/2011    Conditions to be addressed/monitored: Type II diabetes mellitus with stage 4 chronic kidney disease, Hypertensive nephropathy, Pure Hypercholesterolemia, Vitamin D deficiency    Care Plan : RN Care Manager Plan of Care  Updates made by Lynne Logan, RN since 11/04/2020 12:00 AM     Problem: No plan established for mangement of chronic disease states (Type II diabetes mellitus with stage 4 chronic kidney disease, Hypertensive nephropathy, Pure Hypercholesterolemia,  Vitamin D deficiency)   Priority: High     Long-Range Goal: Development of plan of care for management of chronic disease states (Type II diabetes mellitus with stage 4 chronic kidney disease, Hypertensive nephropathy, Pure Hypercholesterolemia, Vitamin D deficiency)   Start Date: 11/04/2020  Expected End Date: 11/04/2020  This Visit's  Progress: On track  Priority: High  Note:   Current Barriers:  Knowledge Deficits related to plan of care for management of Type II diabetes mellitus with stage 4 chronic kidney disease, Hypertensive nephropathy, Pure Hypercholesterolemia, Vitamin D deficiency  Chronic Disease Management support and education needs related to Type II diabetes mellitus with stage 4 chronic kidney disease, Hypertensive nephropathy, Pure Hypercholesterolemia, Vitamin D deficiency   RNCM Clinical Goal(s):  Patient will verbalize basic understanding of  Type II diabetes mellitus with stage 4 chronic kidney disease, Hypertensive nephropathy, Pure Hypercholesterolemia, Vitamin D deficiency  disease process and self health management plan   demonstrate Improved health management independence   continue to work with RN Care Manager to address care management and care coordination needs related to  Type II diabetes mellitus with stage 4 chronic kidney disease, Hypertensive nephropathy, Pure Hypercholesterolemia, Vitamin D deficiency  will demonstrate ongoing self health care management ability    through collaboration with RN Care manager, provider, and care team.   Interventions: 1:1 collaboration with primary care provider regarding development and update of comprehensive plan of care as evidenced by provider attestation and co-signature Inter-disciplinary care team collaboration (see longitudinal plan of care) Evaluation of current treatment plan related to  self management and patient's adherence to plan as established by provider  Hyperlipidemia Interventions: Medication review performed; medication list updated in electronic medical record.  Provider established cholesterol goals reviewed; Counseled on importance of regular laboratory monitoring as prescribed; Provided HLD educational materials; Discussed strategies to manage statin-induced myalgias; Reviewed importance of limiting foods high in  cholesterol; Reviewed exercise goals and target of 150 minutes per week; Assessed social determinant of health barriers;   Goal on track:  Yes Evaluation of current treatment plan related to  Vitamin D deficiency ,  self-management and patient's adherence to plan as established by provider. Discussed plans with patient for ongoing care management follow up and provided patient with direct contact information for care management team Provided education to patient re: ways to improve Vitamin D; instructed patient to eat vitamin d rich foods, take vitamin d supplement as prescribed, get at least 15 minutes of natural sunlight when possible ; Discussed plans with patient for ongoing care management follow up and provided patient with direct contact information for care management team; Mailed patient educational material related to Vitamin D   Diabetes Interventions: Assessed patient's understanding of A1c goal: <6.5% Provided education to patient about basic DM disease process; Reviewed medications with patient and discussed importance of medication adherence; Counseled on importance of regular laboratory monitoring as prescribed; Discussed plans with patient for ongoing care management follow up and provided patient with direct contact information for care management team; Provided patient with written educational materials related to hypo and hyperglycemia and importance of correct treatment; Advised patient, providing education and rationale, to check cbg daily before meals and at bedtime and record, calling PCP and or RN CM for findings outside established parameters; Review of patient status, including review of consultants reports, relevant laboratory and other test results, and medications completed; Assessed social determinant of health barriers;  Lab Results  Component Value Date   HGBA1C 6.4 (H) 10/22/2020  Chronic Kidney Disease Interventions:  (Status:  Goal on track:  Yes Reviewed  prescribed diet increase water intake, low Sodium to help keep BP under good control Counseled on the importance of exercise goals with target of 150 minutes per week     Advised patient, providing education and rationale, to monitor blood pressure daily and record, calling PCP for findings outside established parameters    Discussed complications of poorly controlled blood pressure such as heart disease, stroke, circulatory complications, vision complications, kidney impairment, sexual dysfunction    Discussed plans with patient for ongoing care management follow up and provided patient with direct contact information for care management team    Provided education on kidney disease progression    Last practice recorded BP readings:  BP Readings from Last 3 Encounters:  10/22/20 128/62  10/03/20 140/70  08/12/20 122/70  Most recent eGFR/CrCl:  Lab Results  Component Value Date   EGFR 23 (L) 10/22/2020    No components found for: CRCL  Patient Goals/Self-Care Activities: Patient will self administer medications as prescribed Patient will attend all scheduled provider appointments Patient will call pharmacy for medication refills Patient will attend church or other social activities Patient will continue to perform ADL's independently Patient will continue to perform IADL's independently Patient will call provider office for new concerns or questions  Follow Up Plan:  Telephone follow up appointment with care management team member scheduled for:  02/06/21     Plan:Telephone follow up appointment with care management team member scheduled for:  02/06/21  Barb Merino, RN, BSN, CCM Care Management Coordinator [place] [facility]  Direct Phone: [phone]

## 2020-11-05 NOTE — Patient Instructions (Signed)
Visit Information   PATIENT GOALS/PLAN OF CARE:   Care Plan : RN Care Manager Plan of Care  Updates made by ,  L, RN since 11/05/2020 12:00 AM     Problem: No plan established for mangement of chronic disease states (Type II diabetes mellitus with stage 4 chronic kidney disease, Hypertensive nephropathy, Pure Hypercholesterolemia, Vitamin D deficiency)   Priority: High     Long-Range Goal: Development of plan of care for management of chronic disease states (Type II diabetes mellitus with stage 4 chronic kidney disease, Hypertensive nephropathy, Pure Hypercholesterolemia, Vitamin D deficiency)   Start Date: 11/04/2020  Expected End Date: 11/04/2020  This Visit's Progress: On track  Priority: High  Note:   Current Barriers:  Knowledge Deficits related to plan of care for management of Type II diabetes mellitus with stage 4 chronic kidney disease, Hypertensive nephropathy, Pure Hypercholesterolemia, Vitamin D deficiency  Chronic Disease Management support and education needs related to Type II diabetes mellitus with stage 4 chronic kidney disease, Hypertensive nephropathy, Pure Hypercholesterolemia, Vitamin D deficiency   RNCM Clinical Goal(s):  Patient will verbalize basic understanding of  Type II diabetes mellitus with stage 4 chronic kidney disease, Hypertensive nephropathy, Pure Hypercholesterolemia, Vitamin D deficiency  disease process and self health management plan   demonstrate Improved health management independence   continue to work with RN Care Manager to address care management and care coordination needs related to  Type II diabetes mellitus with stage 4 chronic kidney disease, Hypertensive nephropathy, Pure Hypercholesterolemia, Vitamin D deficiency  will demonstrate ongoing self health care management ability    through collaboration with RN Care manager, provider, and care team.   Interventions: 1:1 collaboration with primary care provider regarding development  and update of comprehensive plan of care as evidenced by provider attestation and co-signature Inter-disciplinary care team collaboration (see longitudinal plan of care) Evaluation of current treatment plan related to  self management and patient's adherence to plan as established by provider  Hyperlipidemia Interventions: Medication review performed; medication list updated in electronic medical record.  Provider established cholesterol goals reviewed; Counseled on importance of regular laboratory monitoring as prescribed; Provided HLD educational materials; Discussed strategies to manage statin-induced myalgias; Reviewed importance of limiting foods high in cholesterol; Reviewed exercise goals and target of 150 minutes per week; Assessed social determinant of health barriers;   Goal on track:  Yes Evaluation of current treatment plan related to  Vitamin D deficiency ,  self-management and patient's adherence to plan as established by provider. Discussed plans with patient for ongoing care management follow up and provided patient with direct contact information for care management team Provided education to patient re: ways to improve Vitamin D; instructed patient to eat vitamin d rich foods, take vitamin d supplement as prescribed, get at least 15 minutes of natural sunlight when possible ; Discussed plans with patient for ongoing care management follow up and provided patient with direct contact information for care management team; Mailed patient educational material related to Vitamin D   Diabetes Interventions: Assessed patient's understanding of A1c goal: <6.5% Provided education to patient about basic DM disease process; Reviewed medications with patient and discussed importance of medication adherence; Counseled on importance of regular laboratory monitoring as prescribed; Discussed plans with patient for ongoing care management follow up and provided patient with direct contact  information for care management team; Provided patient with written educational materials related to hypo and hyperglycemia and importance of correct treatment; Advised patient, providing education and rationale, to check   cbg daily before meals and at bedtime and record, calling PCP and or RN CM for findings outside established parameters; Review of patient status, including review of consultants reports, relevant laboratory and other test results, and medications completed; Assessed social determinant of health barriers;  Lab Results  Component Value Date   HGBA1C 6.4 (H) 10/22/2020   Chronic Kidney Disease Interventions:  (Status:  Goal on track:  Yes Reviewed prescribed diet increase water intake, low Sodium to help keep BP under good control Counseled on the importance of exercise goals with target of 150 minutes per week     Advised patient, providing education and rationale, to monitor blood pressure daily and record, calling PCP for findings outside established parameters    Discussed complications of poorly controlled blood pressure such as heart disease, stroke, circulatory complications, vision complications, kidney impairment, sexual dysfunction    Discussed plans with patient for ongoing care management follow up and provided patient with direct contact information for care management team    Provided education on kidney disease progression    Last practice recorded BP readings:  BP Readings from Last 3 Encounters:  10/22/20 128/62  10/03/20 140/70  08/12/20 122/70  Most recent eGFR/CrCl:  Lab Results  Component Value Date   EGFR 23 (L) 10/22/2020    No components found for: CRCL  Patient Goals/Self-Care Activities: Patient will self administer medications as prescribed Patient will attend all scheduled provider appointments Patient will call pharmacy for medication refills Patient will attend church or other social activities Patient will continue to perform ADL's  independently Patient will continue to perform IADL's independently Patient will call provider office for new concerns or questions  Follow Up Plan:  Telephone follow up appointment with care management team member scheduled for:  02/06/21      Consent to CCM Services: Ms. Winters was given information about Chronic Care Management services including:  CCM service includes personalized support from designated clinical staff supervised by her physician, including individualized plan of care and coordination with other care providers 24/7 contact phone numbers for assistance for urgent and routine care needs. Service will only be billed when office clinical staff spend 20 minutes or more in a month to coordinate care. Only one practitioner may furnish and bill the service in a calendar month. The patient may stop CCM services at any time (effective at the end of the month) by phone call to the office staff. The patient will be responsible for cost sharing (co-pay) of up to 20% of the service fee (after annual deductible is met).  Patient agreed to services and verbal consent obtained.   The patient verbalized understanding of instructions, educational materials, and care plan provided today and declined offer to receive copy of patient instructions, educational materials, and care plan.   Telephone follow up appointment with care management team member scheduled for: 02/06/21  Barb Merino, RN, BSN, CCM Care Management Coordinator [facility]  Direct Phone: [phone]

## 2020-11-07 ENCOUNTER — Ambulatory Visit (INDEPENDENT_AMBULATORY_CARE_PROVIDER_SITE_OTHER): Payer: Medicare PPO

## 2020-11-07 DIAGNOSIS — I129 Hypertensive chronic kidney disease with stage 1 through stage 4 chronic kidney disease, or unspecified chronic kidney disease: Secondary | ICD-10-CM

## 2020-11-07 DIAGNOSIS — E1122 Type 2 diabetes mellitus with diabetic chronic kidney disease: Secondary | ICD-10-CM

## 2020-11-07 NOTE — Patient Instructions (Signed)
Social Worker Visit Information  Goals we discussed today:   Goals Addressed             This Visit's Progress    Caregiver Coping Optimized   On track    Timeframe:  Long-Range Goal Priority:  High Start Date:    4.19.22                                             Next planned outreach: 2.6.23  Patient Goals/Self-Care Activities  patient will:  Engage with Tax adviser as needed regarding Research officer, trade union SW as needed prior to next scheduled call         Patient verbalizes understanding of instructions provided today and agrees to view in [place].   Follow Up Plan: SW will follow up with patient by phone over the next three months   Daneen Schick, BSW, CDP Social Worker, Certified Dementia Practitioner [place] [facility] [place] [facility] [phone]

## 2020-11-07 NOTE — Chronic Care Management (AMB) (Signed)
Chronic Care Management    Social Work Note  11/07/2020 Name: Michelle Cummings MRN: [id number] DOB: [birth year]  Michelle Cummings is a 70 y.o. year old female who is a primary care patient of Michelle Chard, MD. The CCM team was consulted to assist the patient with chronic disease management and/or care coordination needs related to:  DM II and CKD IV .   Engaged with patient by telephone for follow up visit in response to provider referral for social work chronic care management and care coordination services.   Consent to Services:  The patient was given information about Chronic Care Management services, agreed to services, and gave verbal consent prior to initiation of services.  Please see initial visit note for detailed documentation.   Patient agreed to services and consent obtained.   Assessment: Review of patient past medical history, allergies, medications, and health status, including review of relevant consultants reports was performed today as part of a comprehensive evaluation and provision of chronic care management and care coordination services.     SDOH (Social Determinants of Health) assessments and interventions performed:  SDOH Interventions    Flowsheet Row Most Recent Value  SDOH Interventions   Food Insecurity Interventions Intervention Not Indicated  Housing Interventions Intervention Not Indicated        Advanced Directives Status: Not addressed in this encounter.  CCM Care Plan  No Known Allergies  Outpatient Encounter Medications as of 11/07/2020  Medication Sig   ACCU-CHEK GUIDE test strip USE AS DIRECTED TO CHECK BLOOD SUGARS 2 TIMES A DAY DX: E11.22   Accu-Chek Softclix Lancets lancets USE AS DIRECTED TO CHECK BLOOD SUGARS 2 TIMES A DAY DX: E11.22   allopurinol (ZYLOPRIM) 100 MG tablet TK 2 TS PO D   amLODipine (NORVASC) 10 MG tablet TAKE 1 TABLET(10 MG) BY MOUTH DAILY   carvedilol (COREG) 6.25 MG tablet TAKE 1 TABLET BY MOUTH TWICE DAILY   ferrous  sulfate 325 (65 FE) MG tablet Take 1 tablet (325 mg total) by mouth daily with breakfast.   latanoprost (XALATAN) 0.005 % ophthalmic solution place 1 drop into both eyes at bedtime   MITIGARE 0.6 MG CAPS Take 1 capsule by mouth daily   OZEMPIC, 0.25 OR 0.5 MG/DOSE, 2 MG/1.5ML SOPN INJECT 0.5MG  INTO THE SKIN ONCE A WEEK   timolol (TIMOPTIC-XR) 0.5 % ophthalmic gel-forming Place 1 drop into both eyes daily.   valsartan-hydrochlorothiazide (DIOVAN-HCT) 80-12.5 MG tablet TAKE 1 TABLET BY MOUTH DAILY   vitamin C (ASCORBIC ACID) 500 MG tablet Take 1,000 mg by mouth daily.   Vitamin D, Ergocalciferol, (DRISDOL) 1.25 MG (50000 UNIT) CAPS capsule TAKE 1 CAPSULE BY MOUTH EVERY 7 DAYS   No facility-administered encounter medications on file as of 11/07/2020.    Patient Active Problem List   Diagnosis Date Noted   Achilles tendinitis of right lower extremity 05/22/2020   Great toe pain, left 05/22/2020   Hypertensive nephropathy 01/26/2020   Abnormal ankle brachial index (ABI) 01/26/2020   Adjustment disorder with anxiety 01/26/2020   Atherosclerosis of aorta ([place]) 02/01/2018   Gout 07/02/2016   Anemia of chronic disease 06/07/2016   HLD (hyperlipidemia) 06/02/2016   Glaucoma 06/02/2016   Chronic kidney disease, stage 3, mod decreased GFR ([place]) 06/02/2016   HTN (hypertension) 03/05/2011   Type 2 diabetes mellitus with stage 3 chronic kidney disease ([place]) 03/05/2011    Conditions to be addressed/monitored: DMII and CKD Stage IV ;  Caregiver Stress  Care Plan : Social Work [facility] Care  Plan  Updates made by Michelle Cummings since 11/07/2020 12:00 AM     Problem: Caregiver Stress      Long-Range Goal: Caregiver Coping Optimized   Start Date: 04/23/2020  This Visit's Progress: On track  Recent Progress: On track  Priority: High  Note:   Current Barriers:  Chronic disease management support and education needs related to DM and CKD Stage IV   Limited knowledge of caregiver resources  Social  Worker Clinical Goal(s):   patient will work with SW to identify and address any acute and/or chronic care coordination needs related to the self health management of DM and CKD Stage IV    patient will work with SW to identify caregiver respite resources  CCM SW Interventions:  Inter-disciplinary care team collaboration (see longitudinal plan of care) Collaboration with Michelle Chard, MD regarding development and update of comprehensive plan of care as evidenced by provider attestation and co-signature Successful outbound call placed to the patient to assess care coordination needs SDoH screen completed - no acute challenges identified at this time Discussed the patient remains on the wait list for the caregiver respite program offered by [facility] Patient reports her spouse has recently enrolled in an in home primary care service which she feels will alleviate barriers she has been experiencing due to difficult to get him out of the home Scheduled follow up call over the next 3 months  Patient Goals/Self-Care Activities patient will:   -  Museum/gallery conservator with Tax adviser as needed regarding [place] as needed prior to next scheduled call  Follow Up Plan:  SW will follow up with the patient over the next 3 months        Follow Up Plan: SW will follow up with patient by phone over the next three months      Michelle Cummings, BSW, CDP Social Worker, Certified Dementia Practitioner [place] [facility] [place] [facility] [phone]

## 2020-11-13 ENCOUNTER — Ambulatory Visit (INDEPENDENT_AMBULATORY_CARE_PROVIDER_SITE_OTHER): Payer: Medicare PPO

## 2020-11-13 ENCOUNTER — Other Ambulatory Visit: Payer: Self-pay

## 2020-11-13 VITALS — BP 130/60 | HR 75 | Temp 97.7°F | Ht 63.2 in | Wt 149.0 lb

## 2020-11-13 DIAGNOSIS — Z23 Encounter for immunization: Secondary | ICD-10-CM

## 2020-11-13 DIAGNOSIS — Z0000 Encounter for general adult medical examination without abnormal findings: Secondary | ICD-10-CM

## 2020-11-13 MED ORDER — PREVNAR 20 0.5 ML IM SUSY: 0.5000 mL | PREFILLED_SYRINGE | INTRAMUSCULAR | 0 refills | Status: AC

## 2020-11-13 NOTE — Patient Instructions (Signed)
Michelle Cummings , Thank you for taking time to come for your Medicare Wellness Visit. I appreciate your ongoing commitment to your health goals. Please review the following plan we discussed and let me know if I can assist you in the future.   Screening recommendations/referrals: Colonoscopy: completed 01/31/2018, due 3-5 years Mammogram: completed 04/16/2020 Bone Density: completed 01/22/2017 Recommended yearly ophthalmology/optometry visit for glaucoma screening and checkup Recommended yearly dental visit for hygiene and checkup  Vaccinations: Influenza vaccine: due Pneumococcal vaccine: sent to pharmacy Tdap vaccine: completed 10/24/2012, due 10/25/2022 Shingles vaccine: needs second dose   Covid-19: 06/22/2020, 10/31/2019, 02/17/2019, 01/27/2019  Advanced directives: Advance directive discussed with you today. Even though you declined this today please call our office should you change your mind and we can give you the proper paperwork for you to fill out.  Conditions/risks identified: none  Next appointment: Follow up in one year for your annual wellness visit    Preventive Care 65 Years and Older, Female Preventive care refers to lifestyle choices and visits with your health care provider that can promote health and wellness. What does preventive care include? A yearly physical exam. This is also called an annual well check. Dental exams once or twice a year. Routine eye exams. Ask your health care provider how often you should have your eyes checked. Personal lifestyle choices, including: Daily care of your teeth and gums. Regular physical activity. Eating a healthy diet. Avoiding tobacco and drug use. Limiting alcohol use. Practicing safe sex. Taking low-dose aspirin every day. Taking vitamin and mineral supplements as recommended by your health care provider. What happens during an annual well check? The services and screenings done by your health care provider during your annual  well check will depend on your age, overall health, lifestyle risk factors, and family history of disease. Counseling  Your health care provider may ask you questions about your: Alcohol use. Tobacco use. Drug use. Emotional well-being. Home and relationship well-being. Sexual activity. Eating habits. History of falls. Memory and ability to understand (cognition). Work and work Statistician. Reproductive health. Screening  You may have the following tests or measurements: Height, weight, and BMI. Blood pressure. Lipid and cholesterol levels. These may be checked every 5 years, or more frequently if you are over 9 years old. Skin check. Lung cancer screening. You may have this screening every year starting at age 87 if you have a 30-pack-year history of smoking and currently smoke or have quit within the past 15 years. Fecal occult blood test (FOBT) of the stool. You may have this test every year starting at age 65. Flexible sigmoidoscopy or colonoscopy. You may have a sigmoidoscopy every 5 years or a colonoscopy every 10 years starting at age 62. Hepatitis C blood test. Hepatitis B blood test. Sexually transmitted disease (STD) testing. Diabetes screening. This is done by checking your blood sugar (glucose) after you have not eaten for a while (fasting). You may have this done every 1-3 years. Bone density scan. This is done to screen for osteoporosis. You may have this done starting at age 80. Mammogram. This may be done every 1-2 years. Talk to your health care provider about how often you should have regular mammograms. Talk with your health care provider about your test results, treatment options, and if necessary, the need for more tests. Vaccines  Your health care provider may recommend certain vaccines, such as: Influenza vaccine. This is recommended every year. Tetanus, diphtheria, and acellular pertussis (Tdap, Td) vaccine. You may need  a Td booster every 10 years. Zoster  vaccine. You may need this after age 30. Pneumococcal 13-valent conjugate (PCV13) vaccine. One dose is recommended after age 83. Pneumococcal polysaccharide (PPSV23) vaccine. One dose is recommended after age 41. Talk to your health care provider about which screenings and vaccines you need and how often you need them. This information is not intended to replace advice given to you by your health care provider. Make sure you discuss any questions you have with your health care provider. Document Released: 01/18/2015 Document Revised: 09/11/2015 Document Reviewed: 10/23/2014 Elsevier Interactive Patient Education  2017 [place] Prevention in the Home Falls can cause injuries. They can happen to people of all ages. There are many things you can do to make your home safe and to help prevent falls. What can I do on the outside of my home? Regularly fix the edges of walkways and driveways and fix any cracks. Remove anything that might make you trip as you walk through a door, such as a raised step or threshold. Trim any bushes or trees on the path to your home. Use bright outdoor lighting. Clear any walking paths of anything that might make someone trip, such as rocks or tools. Regularly check to see if handrails are loose or broken. Make sure that both sides of any steps have handrails. Any raised decks and porches should have guardrails on the edges. Have any leaves, snow, or ice cleared regularly. Use sand or salt on walking paths during winter. Clean up any spills in your garage right away. This includes oil or grease spills. What can I do in the bathroom? Use night lights. Install grab bars by the toilet and in the tub and shower. Do not use towel bars as grab bars. Use non-skid mats or decals in the tub or shower. If you need to sit down in the shower, use a plastic, non-slip stool. Keep the floor dry. Clean up any water that spills on the floor as soon as it happens. Remove  soap buildup in the tub or shower regularly. Attach bath mats securely with double-sided non-slip rug tape. Do not have throw rugs and other things on the floor that can make you trip. What can I do in the bedroom? Use night lights. Make sure that you have a light by your bed that is easy to reach. Do not use any sheets or blankets that are too big for your bed. They should not hang down onto the floor. Have a firm chair that has side arms. You can use this for support while you get dressed. Do not have throw rugs and other things on the floor that can make you trip. What can I do in the kitchen? Clean up any spills right away. Avoid walking on wet floors. Keep items that you use a lot in easy-to-reach places. If you need to reach something above you, use a strong step stool that has a grab bar. Keep electrical cords out of the way. Do not use floor polish or wax that makes floors slippery. If you must use wax, use non-skid floor wax. Do not have throw rugs and other things on the floor that can make you trip. What can I do with my stairs? Do not leave any items on the stairs. Make sure that there are handrails on both sides of the stairs and use them. Fix handrails that are broken or loose. Make sure that handrails are as long as the stairways. Check  any carpeting to make sure that it is firmly attached to the stairs. Fix any carpet that is loose or worn. Avoid having throw rugs at the top or bottom of the stairs. If you do have throw rugs, attach them to the floor with carpet tape. Make sure that you have a light switch at the top of the stairs and the bottom of the stairs. If you do not have them, ask someone to add them for you. What else can I do to help prevent falls? Wear shoes that: Do not have high heels. Have rubber bottoms. Are comfortable and fit you well. Are closed at the toe. Do not wear sandals. If you use a stepladder: Make sure that it is fully opened. Do not climb a  closed stepladder. Make sure that both sides of the stepladder are locked into place. Ask someone to hold it for you, if possible. Clearly mark and make sure that you can see: Any grab bars or handrails. First and last steps. Where the edge of each step is. Use tools that help you move around (mobility aids) if they are needed. These include: Canes. Walkers. Scooters. Crutches. Turn on the lights when you go into a dark area. Replace any light bulbs as soon as they burn out. Set up your furniture so you have a clear path. Avoid moving your furniture around. If any of your floors are uneven, fix them. If there are any pets around you, be aware of where they are. Review your medicines with your doctor. Some medicines can make you feel dizzy. This can increase your chance of falling. Ask your doctor what other things that you can do to help prevent falls. This information is not intended to replace advice given to you by your health care provider. Make sure you discuss any questions you have with your health care provider. Document Released: 10/18/2008 Document Revised: 05/30/2015 Document Reviewed: 01/26/2014 Elsevier Interactive Patient Education  2017 [employer].

## 2020-11-13 NOTE — Progress Notes (Signed)
This visit occurred during the SARS-CoV-2 public health emergency.  Safety protocols were in place, including screening questions prior to the visit, additional usage of staff PPE, and extensive cleaning of exam room while observing appropriate contact time as indicated for disinfecting solutions.  Subjective:   Michelle Cummings is a 70 y.o. female who presents for Medicare Annual (Subsequent) preventive examination.  Review of Systems     Cardiac Risk Factors include: advanced age (>80men, >34 women);diabetes mellitus;hypertension     Objective:    Today's Vitals   11/13/20 1104 11/13/20 1129  BP: (!) 148/60 130/60  Pulse: 75   Temp: 97.7 F (36.5 C)   TempSrc: Oral   SpO2: 99%   Weight: 149 lb (67.6 kg)   Height: 5' 3.2" (1.605 m)    Body mass index is 26.23 kg/m.  Advanced Directives 11/13/2020 11/23/2019 05/26/2018 08/04/2017 07/01/2016 07/20/2012 07/13/2012  Does Patient Have a Medical Advance Directive? No No No No No Patient does not have advance directive Patient does not have advance directive;Patient would not like information  Would patient like information on creating a medical advance directive? No - Patient declined Yes (MAU/Ambulatory/Procedural Areas - Information given) - No - Patient declined Yes (MAU/Ambulatory/Procedural Areas - Information given) - -  Pre-existing out of facility DNR order (yellow form or pink MOST form) - - - - - - -    Current Medications (verified) Outpatient Encounter Medications as of 11/13/2020  Medication Sig   ACCU-CHEK GUIDE test strip USE AS DIRECTED TO CHECK BLOOD SUGARS 2 TIMES A DAY DX: E11.22   Accu-Chek Softclix Lancets lancets USE AS DIRECTED TO CHECK BLOOD SUGARS 2 TIMES A DAY DX: E11.22   allopurinol (ZYLOPRIM) 100 MG tablet TK 2 TS PO D   amLODipine (NORVASC) 10 MG tablet TAKE 1 TABLET(10 MG) BY MOUTH DAILY   carvedilol (COREG) 6.25 MG tablet TAKE 1 TABLET BY MOUTH TWICE DAILY   ferrous sulfate 325 (65 FE) MG tablet Take 1 tablet  (325 mg total) by mouth daily with breakfast.   latanoprost (XALATAN) 0.005 % ophthalmic solution place 1 drop into both eyes at bedtime   OZEMPIC, 0.25 OR 0.5 MG/DOSE, 2 MG/1.5ML SOPN INJECT 0.5MG  INTO THE SKIN ONCE A WEEK   pneumococcal 20-valent conjugate vaccine (PREVNAR 20) 0.5 ML injection Inject 0.5 mLs into the muscle tomorrow at 10 am for 1 dose.   timolol (TIMOPTIC-XR) 0.5 % ophthalmic gel-forming Place 1 drop into both eyes daily.   valsartan-hydrochlorothiazide (DIOVAN-HCT) 80-12.5 MG tablet TAKE 1 TABLET BY MOUTH DAILY   vitamin C (ASCORBIC ACID) 500 MG tablet Take 1,000 mg by mouth daily.   Vitamin D, Ergocalciferol, (DRISDOL) 1.25 MG (50000 UNIT) CAPS capsule TAKE 1 CAPSULE BY MOUTH EVERY 7 DAYS   MITIGARE 0.6 MG CAPS Take 1 capsule by mouth daily (Patient not taking: Reported on 11/13/2020)   No facility-administered encounter medications on file as of 11/13/2020.    Allergies (verified) Patient has no known allergies.   History: Past Medical History:  Diagnosis Date   Arthritis    Cataracts, bilateral    Cervical disc disease    Chronic kidney disease    Dr. Hillery Hunter yearly for CKD stage II as of 11/26/11   Diabetes mellitus (HCC)    Eczema    Glaucoma    Heart murmur    History of chicken pox    Hyperlipidemia    Hypertension    Seasonal allergies    Thyroid nodule    Past Surgical  History:  Procedure Laterality Date   CESAREAN SECTION  1978   COLONOSCOPY     ORIF ANKLE FRACTURE  11/05/2011   Procedure: OPEN REDUCTION INTERNAL FIXATION (ORIF) ANKLE FRACTURE;  Surgeon: Wylene Simmer, MD;  Location: [place];  Service: Orthopedics;  Laterality: Left;  OPEN REDUCTION INTERNAL FIXATION LEFT LATERAL MALLEOLUS FRACTURE WITH STRESS XRAYS WITH FLOUROSCOPY   ORIF ELBOW FRACTURE  03/05/2011   Procedure: OPEN REDUCTION INTERNAL FIXATION (ORIF) ELBOW/OLECRANON FRACTURE;  Surgeon: Linna Hoff, MD;  Location: [place];  Service: Orthopedics;  Laterality:  Left;   THYROID LOBECTOMY Right 07/20/2012   THYROIDECTOMY Right 07/20/2012   Procedure: RIGHT THYROID LOBECTOMY WITH ISTHMUSECTOMY;  Surgeon: Izora Gala, MD;  Location: [facility];  Service: ENT;  Laterality: Right;   TONSILLECTOMY     TUBAL LIGATION     Family History  Problem Relation Age of Onset   Hypertension Mother    Early death Father    Breast cancer Sister    Healthy Brother    Social History   Socioeconomic History   Marital status: Married    Spouse name: Not on file   Number of children: Not on file   Years of education: Not on file   Highest education level: Not on file  Occupational History   Occupation: retired  Tobacco Use   Smoking status: Never   Smokeless tobacco: Never  Vaping Use   Vaping Use: Never used  Substance and Sexual Activity   Alcohol use: No   Drug use: No   Sexual activity: Not Currently    Birth control/protection: Surgical    Comment: BTL  Other Topics Concern   Not on file  Social History Narrative   Not on file   Social Determinants of Health   Financial Resource Strain: Low Risk    Difficulty of Paying Living Expenses: Not hard at all  Food Insecurity: No Food Insecurity   Worried About Charity fundraiser in the Last Year: Never true   [place] in the Last Year: Never true  Transportation Needs: No Transportation Needs   Lack of Transportation (Medical): No   Lack of Transportation (Non-Medical): No  Physical Activity: Sufficiently Active   Days of Exercise per Week: 5 days   Minutes of Exercise per Session: 30 min  Stress: No Stress Concern Present   Feeling of Stress : Not at all  Social Connections: Not on file    Tobacco Counseling Counseling given: Not Answered   Clinical Intake:  Pre-visit preparation completed: Yes  Pain : No/denies pain     Nutritional Status: BMI 25 -29 Overweight Nutritional Risks: None Diabetes: Yes  How often do you need to have someone help you when you read instructions,  pamphlets, or other written materials from your doctor or pharmacy?: 1 - Never What is the last grade level you completed in school?: associates degree  Diabetic? Yes Nutrition Risk Assessment:  Has the patient had any N/V/D within the last 2 months?  No  Does the patient have any non-healing wounds?  No  Has the patient had any unintentional weight loss or weight gain?  No   Diabetes:  Is the patient diabetic?  Yes  If diabetic, was a CBG obtained today?  No  Did the patient bring in their glucometer from home?  No  How often do you monitor your CBG's? daily.   Financial Strains and Diabetes Management:  Are you having any financial strains with the  device, your supplies or your medication? No .  Does the patient want to be seen by Chronic Care Management for management of their diabetes?  No  Would the patient like to be referred to a Nutritionist or for Diabetic Management?  No   Diabetic Exams:  Diabetic Eye Exam: Completed 08/07/2020 Diabetic Foot Exam: Overdue, Pt has been advised about the importance in completing this exam. Pt is scheduled for diabetic foot exam on next appointment.   Interpreter Needed?: No  Information entered by :: NAllen LPN   Activities of Daily Living In your present state of health, do you have any difficulty performing the following activities: 11/13/2020 11/23/2019  Hearing? N N  Vision? N N  Difficulty concentrating or making decisions? N N  Walking or climbing stairs? N N  Dressing or bathing? N N  Doing errands, shopping? N N  Preparing Food and eating ? N N  Using the Toilet? N N  In the past six months, have you accidently leaked urine? N N  Do you have problems with loss of bowel control? N N  Managing your Medications? N N  Managing your Finances? N N  Housekeeping or managing your Housekeeping? N N  Some recent data might be hidden    Patient Care Team: Glendale Chard, MD as PCP - General (Internal Medicine) Juanita Craver, MD  as Consulting Physician (Gastroenterology) Corliss Parish, MD as Consulting Physician (Nephrology) Warden Fillers, MD as Consulting Physician (Ophthalmology) Marygrace Drought, MD as Consulting Physician (Ophthalmology) Rex Kras Claudette Stapler, RN as [place] Management Daneen Schick as [place] any recent [place] you may have received from other than [facility] providers in the past year (date may be approximate).     Assessment:   This is a routine wellness examination for [facility].  Hearing/Vision screen Vision Screening - Comments:: Regular eye exams, Dr. Satira Sark  Dietary issues and exercise activities discussed: Current Exercise Habits: Home exercise routine, Type of exercise: walking, Time (Minutes): 30, Frequency (Times/Week): 5, Weekly Exercise (Minutes/Week): 150   Goals Addressed             This Visit's Progress    Patient Stated       11/13/2020, wants to keep exercising and get BP down       Depression Screen PHQ 2/9 Scores 11/13/2020 11/23/2019 10/19/2019 10/11/2018 07/07/2018 05/26/2018 05/03/2018  PHQ - 2 Score 0 0 0 0 0 0 0  PHQ- 9 Score - - - - - 0 -    Fall Risk Fall Risk  11/13/2020 11/23/2019 10/19/2019 10/11/2018 07/07/2018  Falls in the past year? 0 0 0 0 0  Risk for fall due to : Medication side effect Medication side effect - - -  Follow up Falls evaluation completed;Education provided;Falls prevention discussed Falls evaluation completed;Education provided;Falls prevention discussed - - -    FALL RISK PREVENTION PERTAINING TO THE HOME:  Any stairs in or around the home? Yes  If so, are there any without handrails? Yes  Home free of loose throw rugs in walkways, pet beds, electrical cords, etc? Yes  Adequate lighting in your home to reduce risk of falls? Yes   ASSISTIVE DEVICES UTILIZED TO PREVENT FALLS:  Life alert? No  Use of a cane, walker or w/c? No  Grab bars in the bathroom? No   Shower chair or bench in shower? Yes  Elevated toilet seat or a handicapped toilet? Yes   TIMED UP AND GO:  Was the test performed? No .    Gait steady and fast without use of assistive device  Cognitive Function:     6CIT Screen 11/13/2020 11/23/2019 05/26/2018  What Year? 0 points 0 points 0 points  What month? 0 points 0 points 0 points  What time? 0 points 0 points 0 points  Count back from 20 0 points 0 points 0 points  Months in reverse 0 points 0 points 0 points  Repeat phrase 0 points 2 points 0 points  Total Score 0 2 0    Immunizations Immunization History  Administered Date(s) Administered   Fluad Quad(high Dose 65+) 11/23/2019   Influenza, High Dose Seasonal PF 11/07/2018   PFIZER(Purple Top)SARS-COV-2 Vaccination 01/27/2019, 02/17/2019, 10/31/2019, 06/22/2020   Pneumococcal Polysaccharide-23 11/13/2015   Tdap 03/05/2011   Zoster Recombinat (Shingrix) 08/28/2019   Zoster, Live 09/22/2012    TDAP status: Up to date  Flu Vaccine status: Due, Education has been provided regarding the importance of this vaccine. Advised may receive this vaccine at local pharmacy or Health Dept. Aware to provide a copy of the vaccination record if obtained from local pharmacy or Health Dept. Verbalized acceptance and understanding.  Pneumococcal vaccine status: Due, Education has been provided regarding the importance of this vaccine. Advised may receive this vaccine at local pharmacy or Health Dept. Aware to provide a copy of the vaccination record if obtained from local pharmacy or Health Dept. Verbalized acceptance and understanding.  Covid-19 vaccine status: Completed vaccines  Qualifies for Shingles Vaccine? Yes   Zostavax completed Yes   Shingrix Completed?: needs second dose  Screening Tests Health Maintenance  Topic Date Due   Pneumonia Vaccine 72+ Years old (2 - PCV) 11/12/2016   Zoster Vaccines- Shingrix (2 of 2) 10/23/2019   INFLUENZA VACCINE  08/05/2020    COVID-19 Vaccine (5 - Booster for Pfizer series) 08/17/2020   FOOT EXAM  10/18/2020   HEMOGLOBIN A1C  04/22/2021   OPHTHALMOLOGY EXAM  08/07/2021   MAMMOGRAM  04/17/2022   TETANUS/TDAP  10/25/2022   COLONOSCOPY (Pts 45-96yrs Insurance coverage will need to be confirmed)  02/01/2028   DEXA SCAN  Completed   Hepatitis C Screening  Completed   HPV VACCINES  Aged Out    Health Maintenance  Health Maintenance Due  Topic Date Due   Pneumonia Vaccine 83+ Years old (2 - PCV) 11/12/2016   Zoster Vaccines- Shingrix (2 of 2) 10/23/2019   INFLUENZA VACCINE  08/05/2020   COVID-19 Vaccine (5 - Booster for Pfizer series) 08/17/2020   FOOT EXAM  10/18/2020    Colorectal cancer screening: Type of screening: Colonoscopy. Completed 01/31/2018. Repeat every 3-5 years  Mammogram status: Completed 04/16/2020. Repeat every year  Bone Density status: Completed 01/22/2017.   Lung Cancer Screening: (Low Dose CT Chest recommended if Age 10-80 years, 30 pack-year currently smoking OR have quit w/in 15years.) does not qualify.   Lung Cancer Screening Referral: no  Additional Screening:  Hepatitis C Screening: does qualify; Completed 10/19/2019  Vision Screening: Recommended annual ophthalmology exams for early detection of glaucoma and other disorders of the eye. Is the patient up to date with their annual eye exam?  Yes  Who is the provider or what is the name of the office in which the patient attends annual eye exams? Dr. Satira Sark If pt is not established with a provider, would they like to be referred to a provider to establish care? No .   Dental Screening: Recommended annual dental exams for proper oral hygiene  Community Resource Referral / Chronic Care Management: CRR required this visit?  No   CCM required this visit?  No      Plan:     I have personally reviewed and noted the following in the patient's chart:   Medical and social history Use of alcohol, tobacco or illicit drugs   Current medications and supplements including opioid prescriptions.  Functional ability and status Nutritional status Physical activity Advanced directives List of other physicians Hospitalizations, surgeries, and ER visits in previous 12 months Vitals Screenings to include cognitive, depression, and falls Referrals and appointments  In addition, I have reviewed and discussed with patient certain preventive protocols, quality metrics, and best practice recommendations. A written personalized care plan for preventive services as well as general preventive health recommendations were provided to patient.     Kellie Simmering, LPN   [id number]   Nurse Notes: none

## 2020-11-20 ENCOUNTER — Encounter: Payer: Medicare PPO | Admitting: Internal Medicine

## 2020-11-25 DIAGNOSIS — N183 Chronic kidney disease, stage 3 unspecified: Secondary | ICD-10-CM | POA: Diagnosis not present

## 2020-11-25 DIAGNOSIS — N189 Chronic kidney disease, unspecified: Secondary | ICD-10-CM | POA: Diagnosis not present

## 2020-11-26 ENCOUNTER — Ambulatory Visit (INDEPENDENT_AMBULATORY_CARE_PROVIDER_SITE_OTHER): Payer: Medicare PPO

## 2020-11-26 ENCOUNTER — Other Ambulatory Visit: Payer: Self-pay

## 2020-11-26 VITALS — BP 128/62 | HR 85 | Temp 98.1°F | Ht 63.0 in | Wt 147.0 lb

## 2020-11-26 DIAGNOSIS — Z23 Encounter for immunization: Secondary | ICD-10-CM | POA: Diagnosis not present

## 2020-11-26 NOTE — Progress Notes (Signed)
Pt presents today for flu immunization.

## 2020-12-04 DIAGNOSIS — E1122 Type 2 diabetes mellitus with diabetic chronic kidney disease: Secondary | ICD-10-CM

## 2020-12-04 DIAGNOSIS — I129 Hypertensive chronic kidney disease with stage 1 through stage 4 chronic kidney disease, or unspecified chronic kidney disease: Secondary | ICD-10-CM | POA: Diagnosis not present

## 2020-12-04 DIAGNOSIS — N183 Chronic kidney disease, stage 3 unspecified: Secondary | ICD-10-CM | POA: Diagnosis not present

## 2020-12-04 DIAGNOSIS — E875 Hyperkalemia: Secondary | ICD-10-CM | POA: Diagnosis not present

## 2020-12-04 DIAGNOSIS — M109 Gout, unspecified: Secondary | ICD-10-CM | POA: Diagnosis not present

## 2020-12-04 DIAGNOSIS — N184 Chronic kidney disease, stage 4 (severe): Secondary | ICD-10-CM

## 2021-01-16 DIAGNOSIS — E1122 Type 2 diabetes mellitus with diabetic chronic kidney disease: Secondary | ICD-10-CM | POA: Diagnosis not present

## 2021-01-16 DIAGNOSIS — N189 Chronic kidney disease, unspecified: Secondary | ICD-10-CM | POA: Diagnosis not present

## 2021-01-16 DIAGNOSIS — I129 Hypertensive chronic kidney disease with stage 1 through stage 4 chronic kidney disease, or unspecified chronic kidney disease: Secondary | ICD-10-CM | POA: Diagnosis not present

## 2021-01-16 DIAGNOSIS — E875 Hyperkalemia: Secondary | ICD-10-CM | POA: Diagnosis not present

## 2021-01-16 DIAGNOSIS — M109 Gout, unspecified: Secondary | ICD-10-CM | POA: Diagnosis not present

## 2021-01-16 DIAGNOSIS — N183 Chronic kidney disease, stage 3 unspecified: Secondary | ICD-10-CM | POA: Diagnosis not present

## 2021-01-23 ENCOUNTER — Ambulatory Visit: Payer: Medicare PPO | Admitting: Internal Medicine

## 2021-01-23 ENCOUNTER — Other Ambulatory Visit: Payer: Self-pay

## 2021-01-23 ENCOUNTER — Encounter: Payer: Self-pay | Admitting: Internal Medicine

## 2021-01-23 VITALS — BP 144/72 | Temp 98.0°F | Ht 64.0 in | Wt 146.0 lb

## 2021-01-23 DIAGNOSIS — I700 Atherosclerosis of aorta: Secondary | ICD-10-CM | POA: Diagnosis not present

## 2021-01-23 DIAGNOSIS — N184 Chronic kidney disease, stage 4 (severe): Secondary | ICD-10-CM | POA: Diagnosis not present

## 2021-01-23 DIAGNOSIS — Z23 Encounter for immunization: Secondary | ICD-10-CM

## 2021-01-23 DIAGNOSIS — I129 Hypertensive chronic kidney disease with stage 1 through stage 4 chronic kidney disease, or unspecified chronic kidney disease: Secondary | ICD-10-CM

## 2021-01-23 DIAGNOSIS — Z6825 Body mass index (BMI) 25.0-25.9, adult: Secondary | ICD-10-CM | POA: Diagnosis not present

## 2021-01-23 DIAGNOSIS — E1122 Type 2 diabetes mellitus with diabetic chronic kidney disease: Secondary | ICD-10-CM | POA: Diagnosis not present

## 2021-01-23 LAB — POCT URINALYSIS DIPSTICK
Bilirubin, UA: NEGATIVE
Glucose, UA: NEGATIVE
Ketones, UA: NEGATIVE
Leukocytes, UA: NEGATIVE
Nitrite, UA: NEGATIVE
Protein, UA: POSITIVE — AB
Spec Grav, UA: 1.01 (ref 1.010–1.025)
Urobilinogen, UA: 0.2 E.U./dL
pH, UA: 5 (ref 5.0–8.0)

## 2021-01-23 NOTE — Patient Instructions (Signed)
dDiabetes Mellitus and Nutrition, Adult When you have diabetes, or diabetes mellitus, it is very important to have healthy eating habits because your blood sugar (glucose) levels are greatly affected by what you eat and drink. Eating healthy foods in the right amounts, at about the same times every day, can help you: Manage your blood glucose. Lower your risk of heart disease. Improve your blood pressure. Reach or maintain a healthy weight. What can affect my meal plan? Every person with diabetes is different, and each person has different needs for a meal plan. Your health care provider may recommend that you work with a dietitian to make a meal plan that is best for you. Your meal plan may vary depending on factors such as: The calories you need. The medicines you take. Your weight. Your blood glucose, blood pressure, and cholesterol levels. Your activity level. Other health conditions you have, such as heart or kidney disease. How do carbohydrates affect me? Carbohydrates, also called carbs, affect your blood glucose level more than any other type of food. Eating carbs raises the amount of glucose in your blood. It is important to know how many carbs you can safely have in each meal. This is different for every person. Your dietitian can help you calculate how many carbs you should have at each meal and for each snack. How does alcohol affect me? Alcohol can cause a decrease in blood glucose (hypoglycemia), especially if you use insulin or take certain diabetes medicines by mouth. Hypoglycemia can be a life-threatening condition. Symptoms of hypoglycemia, such as sleepiness, dizziness, and confusion, are similar to symptoms of having too much alcohol. Do not drink alcohol if: Your health care provider tells you not to drink. You are pregnant, may be pregnant, or are planning to become pregnant. If you drink alcohol: Limit how much you have to: 0-1 drink a day for women. 0-2 drinks a day  for men. Know how much alcohol is in your drink. In the [place] one drink equals one 12 oz bottle of beer (355 mL), one 5 oz glass of wine (148 mL), or one 1 oz glass of hard liquor (44 mL). Keep yourself hydrated with water, diet soda, or unsweetened iced tea. Keep in mind that regular soda, juice, and other mixers may contain a lot of sugar and must be counted as carbs. What are tips for following this plan? Reading food labels Start by checking the serving size on the Nutrition Facts label of packaged foods and drinks. The number of calories and the amount of carbs, fats, and other nutrients listed on the label are based on one serving of the item. Many items contain more than one serving per package. Check the total grams (g) of carbs in one serving. Check the number of grams of saturated fats and trans fats in one serving. Choose foods that have a low amount or none of these fats. Check the number of milligrams (mg) of salt (sodium) in one serving. Most people should limit total sodium intake to less than 2,300 mg per day. Always check the nutrition information of foods labeled as "low-fat" or "nonfat." These foods may be higher in added sugar or refined carbs and should be avoided. Talk to your dietitian to identify your daily goals for nutrients listed on the label. Shopping Avoid buying canned, pre-made, or processed foods. These foods tend to be high in fat, sodium, and added sugar. Shop around the outside edge of the grocery store. This is where you will  most often find fresh fruits and vegetables, bulk grains, fresh meats, and fresh dairy products. Cooking Use low-heat cooking methods, such as baking, instead of high-heat cooking methods, such as deep frying. Cook using healthy oils, such as olive, canola, or sunflower oil. Avoid cooking with butter, cream, or high-fat meats. Meal planning Eat meals and snacks regularly, preferably at the same times every day. Avoid going long periods of  time without eating. Eat foods that are high in fiber, such as fresh fruits, vegetables, beans, and whole grains. Eat 4-6 oz (112-168 g) of lean protein each day, such as lean meat, chicken, fish, eggs, or tofu. One ounce (oz) (28 g) of lean protein is equal to: 1 oz (28 g) of meat, chicken, or fish. 1 egg.  cup (62 g) of tofu. Eat some foods each day that contain healthy fats, such as avocado, nuts, seeds, and fish. What foods should I eat? Fruits Berries. Apples. Oranges. Peaches. Apricots. Plums. Grapes. Mangoes. Papayas. Pomegranates. Kiwi. Cherries. Vegetables Leafy greens, including lettuce, spinach, kale, chard, collard greens, mustard greens, and cabbage. Beets. Cauliflower. Broccoli. Carrots. Green beans. Tomatoes. Peppers. Onions. Cucumbers. Brussels sprouts. Grains Whole grains, such as whole-wheat or whole-grain bread, crackers, tortillas, cereal, and pasta. Unsweetened oatmeal. Quinoa. Brown or wild rice. Meats and other proteins Seafood. Poultry without skin. Lean cuts of poultry and beef. Tofu. Nuts. Seeds. Dairy Low-fat or fat-free dairy products such as milk, yogurt, and cheese. The items listed above may not be a complete list of foods and beverages you can eat and drink. Contact a dietitian for more information. What foods should I avoid? Fruits Fruits canned with syrup. Vegetables Canned vegetables. Frozen vegetables with butter or cream sauce. Grains Refined [place] flour and flour products such as bread, pasta, snack foods, and cereals. Avoid all processed foods. Meats and other proteins Fatty cuts of meat. Poultry with skin. Breaded or fried meats. Processed meat. Avoid saturated fats. Dairy Full-fat yogurt, cheese, or milk. Beverages Sweetened drinks, such as soda or iced tea. The items listed above may not be a complete list of foods and beverages you should avoid. Contact a dietitian for more information. Questions to ask a health care provider Do I need to  meet with a certified diabetes care and education specialist? Do I need to meet with a dietitian? What number can I call if I have questions? When are the best times to check my blood glucose? Where to find more information: American Diabetes Association: diabetes.org Academy of Nutrition and Dietetics: eatright.[employer] of Diabetes and Digestive and Kidney Diseases: [employer] Association of Diabetes Care & Education Specialists: diabeteseducator.org Summary It is important to have healthy eating habits because your blood sugar (glucose) levels are greatly affected by what you eat and drink. It is important to use alcohol carefully. A healthy meal plan will help you manage your blood glucose and lower your risk of heart disease. Your health care provider may recommend that you work with a dietitian to make a meal plan that is best for you. This information is not intended to replace advice given to you by your health care provider. Make sure you discuss any questions you have with your health care provider. Document Revised: 07/26/2019 Document Reviewed: 07/26/2019 Elsevier Patient Education  [place].

## 2021-01-23 NOTE — Progress Notes (Signed)
Rich Brave [place],acting as a Education administrator for [name], MD.,have documented all relevant documentation on the behalf of [name], MD,as directed by  [name], MD while in the presence of [name], MD.  This visit occurred during the SARS-CoV-2 public health emergency.  Safety protocols were in place, including screening questions prior to the visit, additional usage of staff PPE, and extensive cleaning of exam room while observing appropriate contact time as indicated for disinfecting solutions.  Subjective:     Patient ID: Michelle Cummings , female    DOB: 1950/05/20 , 71 y.o.   MRN: [id number]   Chief Complaint  Patient presents with   Diabetes   Hypertension    HPI  The patient is here today for a diabetes and blood pressure f/u.  She reports compliance with meds. Patient reports she was recently taken off of Valsartan and she is concerned her bp has been running high.   Diabetes She presents for her follow-up diabetic visit. She has type 2 diabetes mellitus. Her disease course has been stable. There are no hypoglycemic associated symptoms. Pertinent negatives for diabetes include no blurred vision, no chest pain, no polydipsia, no polyphagia and no polyuria. There are no hypoglycemic complications. Diabetic complications include nephropathy. Risk factors for coronary artery disease include diabetes mellitus, dyslipidemia, hypertension and post-menopausal. She is following a diabetic diet. She participates in exercise three times a week. Her breakfast blood glucose is taken between 9-10 am. Her breakfast blood glucose range is generally 110-130 mg/dl. An ACE inhibitor/angiotensin II receptor blocker is being taken.  Hypertension This is a chronic problem. The current episode started more than 1 year ago. The problem has been gradually improving since onset. The problem is controlled. Pertinent negatives include no blurred vision or chest pain. Past treatments include  angiotensin blockers, diuretics and ACE inhibitors. The current treatment provides moderate improvement. Hypertensive end-organ damage includes kidney disease.    Past Medical History:  Diagnosis Date   Arthritis    Cataracts, bilateral    Cervical disc disease    Chronic kidney disease    Dr. Hillery Hunter yearly for CKD stage II as of 11/26/11   Diabetes mellitus (HCC)    Eczema    Glaucoma    Heart murmur    History of chicken pox    Hyperlipidemia    Hypertension    Seasonal allergies    Thyroid nodule      Family History  Problem Relation Age of Onset   Hypertension Mother    Early death Father    Breast cancer Sister    Healthy Brother      Current Outpatient Medications:    ACCU-CHEK GUIDE test strip, USE AS DIRECTED TO CHECK BLOOD SUGARS 2 TIMES A DAY DX: E11.22, Disp: 100 each, Rfl: 2   Accu-Chek Softclix Lancets lancets, USE AS DIRECTED TO CHECK BLOOD SUGARS 2 TIMES A DAY DX: E11.22, Disp: 100 each, Rfl: 2   allopurinol (ZYLOPRIM) 100 MG tablet, TK 2 TS PO D, Disp: , Rfl:    amLODipine (NORVASC) 10 MG tablet, TAKE 1 TABLET(10 MG) BY MOUTH DAILY, Disp: 90 tablet, Rfl: 1   carvedilol (COREG) 6.25 MG tablet, Take 6.25 mg by mouth 2 (two) times daily with a meal. Take 2 tablets by mouth in the morning and 2 tablets in the evening., Disp: , Rfl:    ferrous sulfate 325 (65 FE) MG tablet, Take 1 tablet (325 mg total) by mouth daily with breakfast.,  Disp: 30 tablet, Rfl: 3   latanoprost (XALATAN) 0.005 % ophthalmic solution, place 1 drop into both eyes at bedtime, Disp: , Rfl:    OZEMPIC, 0.25 OR 0.5 MG/DOSE, 2 MG/1.5ML SOPN, INJECT 0.5MG INTO THE SKIN ONCE A WEEK, Disp: 4.5 mL, Rfl: 1   timolol (TIMOPTIC-XR) 0.5 % ophthalmic gel-forming, Place 1 drop into both eyes daily., Disp: , Rfl:    vitamin C (ASCORBIC ACID) 500 MG tablet, Take 1,000 mg by mouth daily., Disp: , Rfl:    Vitamin D, Ergocalciferol, (DRISDOL) 1.25 MG (50000 UNIT) CAPS capsule, TAKE 1 CAPSULE BY MOUTH EVERY  7 DAYS, Disp: 12 capsule, Rfl: 0   valsartan-hydrochlorothiazide (DIOVAN-HCT) 80-12.5 MG tablet, TAKE 1 TABLET BY MOUTH DAILY (Patient not taking: Reported on 01/23/2021), Disp: 90 tablet, Rfl: 2   No Known Allergies   Review of Systems  Constitutional: Negative.   Eyes:  Negative for blurred vision.  Respiratory: Negative.    Cardiovascular: Negative.  Negative for chest pain.  Endocrine: Negative for polydipsia, polyphagia and polyuria.  Neurological: Negative.   Psychiatric/Behavioral: Negative.      Today's Vitals   01/23/21 1112  BP: (!) 144/72  Temp: 98 F (36.7 C)  Weight: 146 lb (66.2 kg)  Height: '5\' 4"'  (1.626 m)  PainSc: 0-No pain   Body mass index is 25.06 kg/m.  Wt Readings from Last 3 Encounters:  01/23/21 146 lb (66.2 kg)  11/26/20 147 lb (66.7 kg)  11/13/20 149 lb (67.6 kg)     Objective:  Physical Exam Vitals and nursing note reviewed.  Constitutional:      Appearance: Normal appearance.  HENT:     Head: Normocephalic and atraumatic.     Nose:     Comments: Masked     Mouth/Throat:     Comments: Masked  Eyes:     Extraocular Movements: Extraocular movements intact.  Cardiovascular:     Rate and Rhythm: Normal rate and regular rhythm.     Heart sounds: Normal heart sounds.  Pulmonary:     Effort: Pulmonary effort is normal.     Breath sounds: Normal breath sounds.  Musculoskeletal:     Cervical back: Normal range of motion.  Skin:    General: Skin is warm.  Neurological:     General: No focal deficit present.     Mental Status: She is alert.  Psychiatric:        Mood and Affect: Mood normal.        Behavior: Behavior normal.        Assessment And Plan:     1. Type 2 diabetes mellitus with stage 4 chronic kidney disease, without long-term current use of insulin (HCC) Comments: Chronic. Diabetic foot exam was performed.  I will check labs as below and adjust meds as needed. She is interested in Nutrition referral - wants to learn more about  CKD diet.  - POCT Urinalysis Dipstick (81002) - Microalbumin / Creatinine Urine Ratio - Hemoglobin A1c - Referral to Nutrition and Diabetes Services - BMP8+eGFR  2. Hypertensive nephropathy Comments: Uncontrolled.  She has been taken off of ARB therapy by Renal, per patient. I will request Renal notes. She is encouraged to cut back on intake of processed meats including bacon, sausages and deli meats.  She agrees to rto for nurse visit in 1-2 weeks. If still elevated, I will consider adding hydralazine 45m BID to her current regimen.  - Referral to Nutrition and Diabetes Services  3. Atherosclerosis of aorta ([place] Comments:  Chronic, importance of statin compliance was d/w patient. LDL goal <70.  She is encouraged to follow a heart healthy diet/lifestyle.   4. BMI 25.0-25.9,adult Comments: She is encouraged to aim for at least 150 minutes of exercise per week.   5. Immunization due Comments: She agrees to Prevnar-20 IM x 1.    Patient was given opportunity to ask questions. Patient verbalized understanding of the plan and was able to repeat key elements of the plan. All questions were answered to their satisfaction.   I, [name], MD, have reviewed all documentation for this visit. The documentation on 01/23/21 for the exam, diagnosis, procedures, and orders are all accurate and complete.   IF YOU HAVE BEEN REFERRED TO A SPECIALIST, IT MAY TAKE 1-2 WEEKS TO SCHEDULE/PROCESS THE REFERRAL. IF YOU HAVE NOT HEARD FROM US/SPECIALIST IN TWO WEEKS, PLEASE GIVE [place] A CALL AT [phone].   THE PATIENT IS ENCOURAGED TO PRACTICE SOCIAL DISTANCING DUE TO THE COVID-19 PANDEMIC.

## 2021-01-24 LAB — BMP8+EGFR
BUN/Creatinine Ratio: 22 (ref 12–28)
BUN: 55 mg/dL — ABNORMAL HIGH (ref 8–27)
CO2: 18 mmol/L — ABNORMAL LOW (ref 20–29)
Calcium: 9.4 mg/dL (ref 8.7–10.3)
Chloride: 100 mmol/L (ref 96–106)
Creatinine, Ser: 2.53 mg/dL — ABNORMAL HIGH (ref 0.57–1.00)
Glucose: 107 mg/dL — ABNORMAL HIGH (ref 70–99)
Potassium: 5.1 mmol/L (ref 3.5–5.2)
Sodium: 136 mmol/L (ref 134–144)
eGFR: 20 mL/min/{1.73_m2} — ABNORMAL LOW (ref >59)

## 2021-01-24 LAB — HEMOGLOBIN A1C
Est. average glucose Bld gHb Est-mCnc: 128 mg/dL
Hgb A1c MFr Bld: 6.1 % — ABNORMAL HIGH (ref 4.8–5.6)

## 2021-01-24 LAB — MICROALBUMIN / CREATININE URINE RATIO
Creatinine, Urine: 43.9 mg/dL
Microalb/Creat Ratio: 3669 mg/g creat — ABNORMAL HIGH (ref 0–29)
Microalbumin, Urine: 1610.8 ug/mL

## 2021-01-29 ENCOUNTER — Encounter (HOSPITAL_COMMUNITY): Payer: Medicare PPO

## 2021-02-04 ENCOUNTER — Ambulatory Visit: Payer: Medicare PPO | Admitting: Internal Medicine

## 2021-02-06 ENCOUNTER — Ambulatory Visit (INDEPENDENT_AMBULATORY_CARE_PROVIDER_SITE_OTHER): Payer: Medicare PPO

## 2021-02-06 ENCOUNTER — Telehealth: Payer: Medicare PPO

## 2021-02-06 DIAGNOSIS — E559 Vitamin D deficiency, unspecified: Secondary | ICD-10-CM

## 2021-02-06 DIAGNOSIS — N184 Chronic kidney disease, stage 4 (severe): Secondary | ICD-10-CM

## 2021-02-06 DIAGNOSIS — I129 Hypertensive chronic kidney disease with stage 1 through stage 4 chronic kidney disease, or unspecified chronic kidney disease: Secondary | ICD-10-CM

## 2021-02-06 DIAGNOSIS — E7800 Pure hypercholesterolemia, unspecified: Secondary | ICD-10-CM

## 2021-02-06 DIAGNOSIS — E1122 Type 2 diabetes mellitus with diabetic chronic kidney disease: Secondary | ICD-10-CM

## 2021-02-06 NOTE — Patient Instructions (Signed)
Visit Information  Thank you for taking time to visit with me today. Please don't hesitate to contact me if I can be of assistance to you before our next scheduled telephone appointment.  Following are the goals we discussed today:  (Copy and paste patient goals from clinical care plan here)  Our next appointment is by telephone on 03/19/21 at 12:45 PM  Please call the care guide team at [phone] if you need to cancel or reschedule your appointment.   If you are experiencing a Mental Health or [place] or need someone to talk to, please call [phone]-TALK (toll free, 24 hour hotline)   Patient verbalizes understanding of instructions and care plan provided today and agrees to view in [place]. Active MyChart status confirmed with patient.    Barb Merino, RN, BSN, CCM Care Management Coordinator [place] [facility]  Direct Phone: [phone]

## 2021-02-06 NOTE — Chronic Care Management (AMB) (Signed)
Chronic Care Management   CCM RN Visit Note  02/06/2021 Name: Michelle Cummings MRN: [id number] DOB: Mar 04, 1950  Subjective: Michelle Cummings is a 71 y.o. year old female who is a primary care patient of Glendale Chard, MD. The care management team was consulted for assistance with disease management and care coordination needs.    Engaged with patient by telephone for follow up visit in response to provider referral for case management and/or care coordination services.   Consent to Services:  The patient was given information about Chronic Care Management services, agreed to services, and gave verbal consent prior to initiation of services.  Please see initial visit note for detailed documentation.   Patient agreed to services and verbal consent obtained.   Assessment: Review of patient past medical history, allergies, medications, health status, including review of consultants reports, laboratory and other test data, was performed as part of comprehensive evaluation and provision of chronic care management services.   SDOH (Social Determinants of Health) assessments and interventions performed:  Yes, no acute challenges   CCM Care Plan  No Known Allergies  Outpatient Encounter Medications as of 02/06/2021  Medication Sig   ACCU-CHEK GUIDE test strip USE AS DIRECTED TO CHECK BLOOD SUGARS 2 TIMES A DAY DX: E11.22   Accu-Chek Softclix Lancets lancets USE AS DIRECTED TO CHECK BLOOD SUGARS 2 TIMES A DAY DX: E11.22   allopurinol (ZYLOPRIM) 100 MG tablet TK 2 TS PO D   amLODipine (NORVASC) 10 MG tablet TAKE 1 TABLET(10 MG) BY MOUTH DAILY   carvedilol (COREG) 6.25 MG tablet Take 6.25 mg by mouth 2 (two) times daily with a meal. Take 2 tablets by mouth in the morning and 2 tablets in the evening.   ferrous sulfate 325 (65 FE) MG tablet Take 1 tablet (325 mg total) by mouth daily with breakfast.   latanoprost (XALATAN) 0.005 % ophthalmic solution place 1 drop into both eyes at bedtime   OZEMPIC, 0.25  OR 0.5 MG/DOSE, 2 MG/1.5ML SOPN INJECT 0.5MG INTO THE SKIN ONCE A WEEK   timolol (TIMOPTIC-XR) 0.5 % ophthalmic gel-forming Place 1 drop into both eyes daily.   valsartan-hydrochlorothiazide (DIOVAN-HCT) 80-12.5 MG tablet TAKE 1 TABLET BY MOUTH DAILY   vitamin C (ASCORBIC ACID) 500 MG tablet Take 1,000 mg by mouth daily.   Vitamin D, Ergocalciferol, (DRISDOL) 1.25 MG (50000 UNIT) CAPS capsule TAKE 1 CAPSULE BY MOUTH EVERY 7 DAYS   No facility-administered encounter medications on file as of 02/06/2021.    Patient Active Problem List   Diagnosis Date Noted   Achilles tendinitis of right lower extremity 05/22/2020   Great toe pain, left 05/22/2020   Hypertensive nephropathy 01/26/2020   Abnormal ankle brachial index (ABI) 01/26/2020   Adjustment disorder with anxiety 01/26/2020   Atherosclerosis of aorta ([place]) 02/01/2018   Gout 07/02/2016   Anemia of chronic disease 06/07/2016   HLD (hyperlipidemia) 06/02/2016   Glaucoma 06/02/2016   Chronic kidney disease, stage 3, mod decreased GFR ([place]) 06/02/2016   HTN (hypertension) 03/05/2011   Type 2 diabetes mellitus with stage 3 chronic kidney disease ([place]) 03/05/2011    Conditions to be addressed/monitored: Type II diabetes mellitus with stage 4 chronic kidney disease, Hypertensive nephropathy, Pure Hypercholesterolemia, Vitamin D deficiency   Care Plan : RN Care Manager Plan of Care  Updates made by Lynne Logan, RN since 02/06/2021 12:00 AM     Problem: No plan established for mangement of chronic disease states (Type II diabetes mellitus with stage 4 chronic  kidney disease, Hypertensive nephropathy, Pure Hypercholesterolemia, Vitamin D deficiency)   Priority: High     Long-Range Goal: Development of plan of care for management of chronic disease states (Type II diabetes mellitus with stage 4 chronic kidney disease, Hypertensive nephropathy, Pure Hypercholesterolemia, Vitamin D deficiency)   Start Date: 11/04/2020  Expected End Date:  11/04/2020  Recent Progress: On track  Priority: High  Note:   Current Barriers:  Knowledge Deficits related to plan of care for management of Type II diabetes mellitus with stage 4 chronic kidney disease, Hypertensive nephropathy, Pure Hypercholesterolemia, Vitamin D deficiency  Chronic Disease Management support and education needs related to Type II diabetes mellitus with stage 4 chronic kidney disease, Hypertensive nephropathy, Pure Hypercholesterolemia, Vitamin D deficiency   RNCM Clinical Goal(s):  Patient will verbalize basic understanding of  Type II diabetes mellitus with stage 4 chronic kidney disease, Hypertensive nephropathy, Pure Hypercholesterolemia, Vitamin D deficiency  disease process and self health management plan   demonstrate Improved health management independence   continue to work with RN Care Manager to address care management and care coordination needs related to  Type II diabetes mellitus with stage 4 chronic kidney disease, Hypertensive nephropathy, Pure Hypercholesterolemia, Vitamin D deficiency  will demonstrate ongoing self health care management ability    through collaboration with RN Care manager, provider, and care team.   Interventions: 1:1 collaboration with primary care provider regarding development and update of comprehensive plan of care as evidenced by provider attestation and co-signature Inter-disciplinary care team collaboration (see longitudinal plan of care) Evaluation of current treatment plan related to  self management and patient's adherence to plan as established by provider  Hyperlipidemia Interventions: Status: (Condition stable.  Not addressed this visit.) Medication review performed; medication list updated in electronic medical record.  Provider established cholesterol goals reviewed; Counseled on importance of regular laboratory monitoring as prescribed; Provided HLD educational materials; Discussed strategies to manage statin-induced  myalgias; Reviewed importance of limiting foods high in cholesterol; Reviewed exercise goals and target of 150 minutes per week; Assessed social determinant of health barriers;   Vitamin D deficiency Interventions: Status: (Condition stable.  Not addressed this visit.) Evaluation of current treatment plan related to  Vitamin D deficiency ,  self-management and patient's adherence to plan as established by provider. Discussed plans with patient for ongoing care management follow up and provided patient with direct contact information for care management team Provided education to patient re: ways to improve Vitamin D; instructed patient to eat vitamin d rich foods, take vitamin d supplement as prescribed, get at least 15 minutes of natural sunlight when possible ; Discussed plans with patient for ongoing care management follow up and provided patient with direct contact information for care management team; Mailed patient educational material related to Vitamin D    Chronic Kidney Disease Interventions:  (Status:  Goal on track:  Yes.) Long Term Goal Assessed the Patient understanding of chronic kidney disease    Evaluation of current treatment plan related to chronic kidney disease self management and patient's adherence to plan as established by provider      Reviewed prescribed diet increase daily water intake to 48-64 oz unless otherwise directed Provided education on kidney disease progression    Printed educational materials related to Chronic Disease Management  Discussed plans with patient for ongoing care management follow up and provided patient with direct contact information for care management team Last practice recorded BP readings:  BP Readings from Last 3 Encounters:  01/23/21 (!) 144/72  11/26/20 128/62  11/13/20 130/60  Most recent eGFR/CrCl:  Lab Results  Component Value Date   EGFR 20 (L) 01/23/2021    No components found for: CRCL   Diabetes Interventions:  (Status:   Goal on track:  Yes.) Long Term Goal Assessed patient's understanding of A1c goal:  <5.7 % Provided education to patient about basic DM disease process Reviewed medications with patient and discussed importance of medication adherence Counseled on importance of regular laboratory monitoring as prescribed Advised patient, providing education and rationale, to check cbg daily before breakfast and record, calling PCP for findings outside established parameters Review of patient status, including review of consultants reports, relevant laboratory and other test results, and medications completed Assessed social determinant of health barriers Educated patient on dietary and exercise recommendations; daily glycemic control FBS 80-130, <180 after meals;15'15' rule Mailed printed educational materials related to Chair Exercises Discussed plans with patient for ongoing care management follow up and provided patient with direct contact information for care management team Lab Results  Component Value Date   HGBA1C 6.1 (H) 01/23/2021     Patient Goals/Self-Care Activities: Take all medications as prescribed Attend all scheduled provider appointments Call pharmacy for medication refills 3-7 days in advance of running out of medications Perform all self care activities independently  Perform IADL's (shopping, preparing meals, housekeeping, managing finances) independently Call provider office for new concerns or questions  drink 6 to 8 glasses of water each day fill half of plate with vegetables manage portion size  Follow Up Plan:  Telephone follow up appointment with care management team member scheduled for:  03/19/21      Plan:Telephone follow up appointment with care management team member scheduled for:  03/19/21  Barb Merino, RN, BSN, CCM Care Management Coordinator [place] [facility]  Direct Phone: [phone]

## 2021-02-10 ENCOUNTER — Encounter: Payer: Self-pay | Admitting: Internal Medicine

## 2021-02-10 ENCOUNTER — Ambulatory Visit: Payer: Medicare PPO

## 2021-02-10 DIAGNOSIS — I129 Hypertensive chronic kidney disease with stage 1 through stage 4 chronic kidney disease, or unspecified chronic kidney disease: Secondary | ICD-10-CM

## 2021-02-10 DIAGNOSIS — H401131 Primary open-angle glaucoma, bilateral, mild stage: Secondary | ICD-10-CM | POA: Diagnosis not present

## 2021-02-10 DIAGNOSIS — E1122 Type 2 diabetes mellitus with diabetic chronic kidney disease: Secondary | ICD-10-CM

## 2021-02-10 NOTE — Chronic Care Management (AMB) (Signed)
Chronic Care Management    Social Work Note  02/10/2021 Name: Michelle Cummings MRN: [id number] DOB: [birth year]  Michelle Cummings is a 71 y.o. year old female who is a primary care patient of Glendale Chard, MD. The CCM team was consulted to assist the patient with chronic disease management and/or care coordination needs related to:  DM II, CKD IV .   Engaged with patient by telephone for follow up visit in response to provider referral for social work chronic care management and care coordination services.   Consent to Services:  The patient was given information about Chronic Care Management services, agreed to services, and gave verbal consent prior to initiation of services.  Please see initial visit note for detailed documentation.   Patient agreed to services and consent obtained.   Assessment: Review of patient past medical history, allergies, medications, and health status, including review of relevant consultants reports was performed today as part of a comprehensive evaluation and provision of chronic care management and care coordination services.     SDOH (Social Determinants of Health) assessments and interventions performed:  SDOH Interventions    Flowsheet Row Most Recent Value  SDOH Interventions   Food Insecurity Interventions Intervention Not Indicated  Housing Interventions Intervention Not Indicated  Transportation Interventions Intervention Not Indicated        Advanced Directives Status: Not addressed in this encounter.  CCM Care Plan  No Known Allergies  Outpatient Encounter Medications as of 02/10/2021  Medication Sig   ACCU-CHEK GUIDE test strip USE AS DIRECTED TO CHECK BLOOD SUGARS 2 TIMES A DAY DX: E11.22   Accu-Chek Softclix Lancets lancets USE AS DIRECTED TO CHECK BLOOD SUGARS 2 TIMES A DAY DX: E11.22   allopurinol (ZYLOPRIM) 100 MG tablet TK 2 TS PO D   amLODipine (NORVASC) 10 MG tablet TAKE 1 TABLET(10 MG) BY MOUTH DAILY   carvedilol (COREG) 6.25 MG tablet  Take 6.25 mg by mouth 2 (two) times daily with a meal. Take 2 tablets by mouth in the morning and 2 tablets in the evening.   ferrous sulfate 325 (65 FE) MG tablet Take 1 tablet (325 mg total) by mouth daily with breakfast.   latanoprost (XALATAN) 0.005 % ophthalmic solution place 1 drop into both eyes at bedtime   OZEMPIC, 0.25 OR 0.5 MG/DOSE, 2 MG/1.5ML SOPN INJECT 0.5MG  INTO THE SKIN ONCE A WEEK   timolol (TIMOPTIC-XR) 0.5 % ophthalmic gel-forming Place 1 drop into both eyes daily.   valsartan-hydrochlorothiazide (DIOVAN-HCT) 80-12.5 MG tablet TAKE 1 TABLET BY MOUTH DAILY   vitamin C (ASCORBIC ACID) 500 MG tablet Take 1,000 mg by mouth daily.   Vitamin D, Ergocalciferol, (DRISDOL) 1.25 MG (50000 UNIT) CAPS capsule TAKE 1 CAPSULE BY MOUTH EVERY 7 DAYS   No facility-administered encounter medications on file as of 02/10/2021.    Patient Active Problem List   Diagnosis Date Noted   Achilles tendinitis of right lower extremity 05/22/2020   Great toe pain, left 05/22/2020   Hypertensive nephropathy 01/26/2020   Abnormal ankle brachial index (ABI) 01/26/2020   Adjustment disorder with anxiety 01/26/2020   Atherosclerosis of aorta ([place]) 02/01/2018   Gout 07/02/2016   Anemia of chronic disease 06/07/2016   HLD (hyperlipidemia) 06/02/2016   Glaucoma 06/02/2016   Chronic kidney disease, stage 3, mod decreased GFR ([place]) 06/02/2016   HTN (hypertension) 03/05/2011   Type 2 diabetes mellitus with stage 3 chronic kidney disease ([place]) 03/05/2011    Conditions to be addressed/monitored: DMII and CKD Stage IV ;  Caregiver Stress  Care Plan : Social Work [facility] Care Plan  Updates made by Daneen Schick since 02/10/2021 12:00 AM  Completed 02/10/2021   Problem: Caregiver Stress Resolved 02/10/2021     Long-Range Goal: Caregiver Coping Optimized Completed 02/10/2021  Start Date: 04/23/2020  Recent Progress: On track  Priority: High  Note:   Current Barriers:  Chronic disease management support and  education needs related to DM and CKD Stage IV   Limited knowledge of caregiver resources  Social Worker Clinical Goal(s):   patient will work with SW to identify and address any acute and/or chronic care coordination needs related to the self health management of DM and CKD Stage IV    patient will work with SW to identify caregiver respite resources  CCM SW Interventions:  Inter-disciplinary care team collaboration (see longitudinal plan of care) Collaboration with Glendale Chard, MD regarding development and update of comprehensive plan of care as evidenced by provider attestation and co-signature Telephonic visit completed with the patient to assess care coordination needs Discussed the patient remains on the wait list for the caregiver respite program to receive assistance in care for her spouse Reviewed the patients spouse remains on the waiting list for the [facility] in home aid program and is currently 66th in line Determined there are no other care coordination needs identified by the patient Discussed plan for SW to close goal with option to re-open as needed; patient agreeable  Patient Goals/Self-Care Activities patient will:   -  Museum/gallery conservator with Tax adviser as needed regarding Research officer, trade union SW as needed         Follow Up Plan:  No planned follow up at this time. Patient will remain engaged with RN Care Manager.      Daneen Schick, BSW, CDP Social Worker, Certified Dementia Practitioner [place] [facility] [phone]

## 2021-02-10 NOTE — Patient Instructions (Signed)
Social Worker Visit Information  Goals we discussed today:   Goals Addressed             This Visit's Progress    COMPLETED: Caregiver Coping Optimized       Timeframe:  Long-Range Goal Priority:  High Start Date:    4.19.22                                             Patient Goals/Self-Care Activities  patient will:  Engage with Tax adviser as needed regarding Research officer, trade union SW as needed         Materials Provided: Verbal education about caregiver resources provided by phone  Patient verbalizes understanding of instructions and care plan provided today and agrees to view in [place]. Active MyChart status confirmed with patient.    Follow Up Plan:  No SW follow up planned at this time. Please contact me as needed.   Daneen Schick, BSW, CDP Social Worker, Certified Dementia Practitioner [facility] [phone]

## 2021-02-11 ENCOUNTER — Ambulatory Visit: Payer: Medicare PPO

## 2021-02-13 ENCOUNTER — Other Ambulatory Visit: Payer: Self-pay

## 2021-02-13 ENCOUNTER — Encounter: Payer: Self-pay | Admitting: Sports Medicine

## 2021-02-13 ENCOUNTER — Ambulatory Visit (INDEPENDENT_AMBULATORY_CARE_PROVIDER_SITE_OTHER): Payer: Medicare PPO | Admitting: Sports Medicine

## 2021-02-13 DIAGNOSIS — M2141 Flat foot [pes planus] (acquired), right foot: Secondary | ICD-10-CM

## 2021-02-13 DIAGNOSIS — M79675 Pain in left toe(s): Secondary | ICD-10-CM | POA: Diagnosis not present

## 2021-02-13 DIAGNOSIS — M2041 Other hammer toe(s) (acquired), right foot: Secondary | ICD-10-CM

## 2021-02-13 DIAGNOSIS — B351 Tinea unguium: Secondary | ICD-10-CM

## 2021-02-13 DIAGNOSIS — L84 Corns and callosities: Secondary | ICD-10-CM

## 2021-02-13 DIAGNOSIS — E119 Type 2 diabetes mellitus without complications: Secondary | ICD-10-CM

## 2021-02-13 DIAGNOSIS — M79674 Pain in right toe(s): Secondary | ICD-10-CM | POA: Diagnosis not present

## 2021-02-13 DIAGNOSIS — M2042 Other hammer toe(s) (acquired), left foot: Secondary | ICD-10-CM

## 2021-02-13 DIAGNOSIS — M2142 Flat foot [pes planus] (acquired), left foot: Secondary | ICD-10-CM

## 2021-02-13 NOTE — Progress Notes (Signed)
Subjective: Michelle Cummings is a 71 y.o. female patient with history of diabetes who presents to office today complaining of long,mildly painful nails and callus while ambulating in shoes; unable to trim. Reports that  she needs new diabetic shoes for the year. No other issues noted.  FBS not recorded  A1c 6.1  Glendale Chard, MD, PCP last visit 01/23/2021   Patient Active Problem List   Diagnosis Date Noted   Achilles tendinitis of right lower extremity 05/22/2020   Great toe pain, left 05/22/2020   Hypertensive nephropathy 01/26/2020   Abnormal ankle brachial index (ABI) 01/26/2020   Adjustment disorder with anxiety 01/26/2020   Atherosclerosis of aorta ([place]) 02/01/2018   Gout 07/02/2016   Anemia of chronic disease 06/07/2016   HLD (hyperlipidemia) 06/02/2016   Glaucoma 06/02/2016   Chronic kidney disease, stage 3, mod decreased GFR (HCC) 06/02/2016   HTN (hypertension) 03/05/2011   Type 2 diabetes mellitus with stage 3 chronic kidney disease ([place]) 03/05/2011   Current Outpatient Medications on File Prior to Visit  Medication Sig Dispense Refill   omeprazole (PRILOSEC) 40 MG capsule 1 cap(s) orally 20 minutes before breakfast     ACCU-CHEK GUIDE test strip USE AS DIRECTED TO CHECK BLOOD SUGARS 2 TIMES A DAY DX: E11.22 100 each 2   Accu-Chek Softclix Lancets lancets USE AS DIRECTED TO CHECK BLOOD SUGARS 2 TIMES A DAY DX: E11.22 100 each 2   allopurinol (ZYLOPRIM) 100 MG tablet TK 2 TS PO D     amLODipine (NORVASC) 10 MG tablet TAKE 1 TABLET(10 MG) BY MOUTH DAILY 90 tablet 1   carvedilol (COREG) 6.25 MG tablet Take 6.25 mg by mouth 2 (two) times daily with a meal. Take 2 tablets by mouth in the morning and 2 tablets in the evening.     ferrous sulfate 325 (65 FE) MG tablet Take 1 tablet (325 mg total) by mouth daily with breakfast. 30 tablet 3   latanoprost (XALATAN) 0.005 % ophthalmic solution place 1 drop into both eyes at bedtime     OZEMPIC, 0.25 OR 0.5 MG/DOSE, 2 MG/1.5ML SOPN  INJECT 0.5MG INTO THE SKIN ONCE A WEEK 4.5 mL 1   timolol (TIMOPTIC-XR) 0.5 % ophthalmic gel-forming Place 1 drop into both eyes daily.     valsartan-hydrochlorothiazide (DIOVAN-HCT) 80-12.5 MG tablet TAKE 1 TABLET BY MOUTH DAILY 90 tablet 2   vitamin C (ASCORBIC ACID) 500 MG tablet Take 1,000 mg by mouth daily.     Vitamin D, Ergocalciferol, (DRISDOL) 1.25 MG (50000 UNIT) CAPS capsule TAKE 1 CAPSULE BY MOUTH EVERY 7 DAYS 12 capsule 0   No current facility-administered medications on file prior to visit.   No Known Allergies  Recent Results (from the past 2160 hour(s))  Microalbumin / Creatinine Urine Ratio     Status: Abnormal   Collection Time: 01/23/21 11:51 AM  Result Value Ref Range   Creatinine, Urine 43.9 Not Estab. mg/dL   Microalbumin, Urine 1,610.8 Not Estab. ug/mL    Comment: Results confirmed on dilution.    Microalb/Creat Ratio 3,669 (H) 0 - 29 mg/g creat    Comment:                        Normal:                0 -  29                        Moderately  increased: 30 - 300                        Severely increased:       >300   Hemoglobin A1c     Status: Abnormal   Collection Time: 01/23/21 11:51 AM  Result Value Ref Range   Hgb A1c MFr Bld 6.1 (H) 4.8 - 5.6 %    Comment:          Prediabetes: 5.7 - 6.4          Diabetes: >6.4          Glycemic control for adults with diabetes: <7.0    Est. average glucose Bld gHb Est-mCnc 128 mg/dL  BMP8+eGFR     Status: Abnormal   Collection Time: 01/23/21 11:51 AM  Result Value Ref Range   Glucose 107 (H) 70 - 99 mg/dL   BUN 55 (H) 8 - 27 mg/dL   Creatinine, Ser 2.53 (H) 0.57 - 1.00 mg/dL   eGFR 20 (L) >59 mL/min/1.73   BUN/Creatinine Ratio 22 12 - 28   Sodium 136 134 - 144 mmol/L   Potassium 5.1 3.5 - 5.2 mmol/L   Chloride 100 96 - 106 mmol/L   CO2 18 (L) 20 - 29 mmol/L   Calcium 9.4 8.7 - 10.3 mg/dL  POCT Urinalysis Dipstick (69678)     Status: Abnormal   Collection Time: 01/23/21  3:08 PM  Result Value Ref Range    Color, UA yellow    Clarity, UA clear    Glucose, UA Negative Negative   Bilirubin, UA negative    Ketones, UA negative    Spec Grav, UA 1.010 1.010 - 1.025   Blood, UA trace    pH, UA 5.0 5.0 - 8.0   Protein, UA Positive (A) Negative   Urobilinogen, UA 0.2 0.2 or 1.0 E.U./dL   Nitrite, UA negative    Leukocytes, UA Negative Negative   Appearance     Odor      Objective: General: Patient is awake, alert, and oriented x 3 and in no acute distress.  Integument: Skin is warm, dry and supple bilateral. Nails are tender, long, thickened and dystrophic with minimal subungual debris, consistent with onychomycosis, 1-5 bilateral. No signs of infection.  Callus submet 5 and fifth toes present bilateral. Remaining integument unremarkable.  Vasculature:  Dorsalis Pedis pulse 2/4 bilateral. Posterior Tibial pulse 1/4 bilateral. Capillary fill time <3 sec 1-5 bilateral. Positive hair growth to the level of the digits.Temperature gradient within normal limits. No varicosities present bilateral. No edema present bilateral.   Neurology: The patient has intact sensation measured with a 5.07/10g Semmes Weinstein Monofilament at all pedal sites bilateral . Vibratory sensation intact bilateral with tuning fork. No Babinski sign present bilateral.   Musculoskeletal: Asymptomatic hammertoe and pes planus and prominent fifth metatarsal head at area of callus with minimal metatarsalgia pedal deformities noted bilateral. Muscular strength 5/5 in all lower extremity muscular groups bilateral without pain on range of motion . No tenderness with calf compression bilateral.  Assessment and Plan: Problem List Items Addressed This Visit   None Visit Diagnoses     Pain due to onychomycosis of toenails of both feet    -  Primary   Diabetes mellitus without complication (HCC)       Callus       Hammer toes of both feet       Pes planus of both feet           -  Examined patient. -Re-Discussed and educated  patient on diabetic foot care, especially with regards to the vascular, neurological and musculoskeletal systems.  -Mechanically debrided all nails 1-5 bilateral using sterile nail nipper and filed with dremel without incident  -Mechanically debrided callus using a sterile chisel blade bilateral submet 5 and fifth toes bilateral without incident -Patient to see Aaron Edelman to be measured for new diabetic shoes with a year -Answered all patient questions -Patient to return  in 3 months for at risk foot care  Landis Martins, DPM

## 2021-02-18 ENCOUNTER — Ambulatory Visit: Payer: Medicare PPO

## 2021-02-19 ENCOUNTER — Telehealth: Payer: Self-pay

## 2021-02-19 NOTE — Telephone Encounter (Signed)
The pt stated that her blood pressure has been running between 140-150 top number and today it is 170/76.  The pt was notified that Dr. Baird Cancer wants the pt to contact her kidney doctor because they stopped the medication and Dr. Baird Cancer hasn't received the information about the pt's kidney function so that she can make changes.  The pt said that she will call her kidney doctor now.

## 2021-02-21 ENCOUNTER — Telehealth: Payer: Self-pay

## 2021-02-21 ENCOUNTER — Ambulatory Visit: Payer: Medicare PPO

## 2021-02-21 ENCOUNTER — Other Ambulatory Visit: Payer: Self-pay

## 2021-02-21 DIAGNOSIS — M2042 Other hammer toe(s) (acquired), left foot: Secondary | ICD-10-CM

## 2021-02-21 DIAGNOSIS — M2141 Flat foot [pes planus] (acquired), right foot: Secondary | ICD-10-CM

## 2021-02-21 DIAGNOSIS — M2142 Flat foot [pes planus] (acquired), left foot: Secondary | ICD-10-CM

## 2021-02-21 DIAGNOSIS — L84 Corns and callosities: Secondary | ICD-10-CM

## 2021-02-21 DIAGNOSIS — E119 Type 2 diabetes mellitus without complications: Secondary | ICD-10-CM

## 2021-02-21 DIAGNOSIS — M2041 Other hammer toe(s) (acquired), right foot: Secondary | ICD-10-CM

## 2021-02-21 NOTE — Telephone Encounter (Signed)
Casts Sent to [place]

## 2021-02-21 NOTE — Progress Notes (Signed)
SITUATION Reason for Consult: Evaluation for Prefabricated Diabetic Shoes and Bilateral Custom Diabetic Inserts. Patient / Caregiver Report: Patient would like well fitting shoes  OBJECTIVE DATA: Patient History / Diagnosis:    ICD-10-CM   1. Diabetes mellitus without complication (HCC)  H74.1     2. Hammer toes of both feet  M20.41    M20.42     3. Pes planus of both feet  M21.41    M21.42     4. Callus  L84       Current or Previous Devices:   Historical shoe use  In-Person Foot Examination: Ulcers & Callousing:   Historical callusing  Toe / Foot Deformities:   - Pes Planus - Hammertoes   Shoe Size: 9.5W  ORTHOTIC RECOMMENDATION Recommended Devices: - 1x pair prefabricated PDAC approved diabetic shoes: Patient Selects - Orthofeet 845 9W - 3x pair custom-to-patient vacuum formed diabetic insoles.   GOALS OF SHOES AND INSOLES - Reduce shear and pressure - Reduce / Prevent callus formation - Reduce / Prevent ulceration - Protect the fragile healing compromised diabetic foot.  Patient would benefit from diabetic shoes and inserts as patient has diabetes mellitus and the patient has one or more of the following conditions: - History of pre-ulcerative callus - Peripheral neuropathy with evidence of callus formation - Foot deformity - Poor circulation  ACTIONS PERFORMED Patient was casted for insoles via crush box and measured for shoes via brannock device. Procedure was explained and patient tolerated procedure well. All questions were answered and concerns addressed.  PLAN Patient is to ensure treating physician receives and completes diabetic paperwork. Casts and shoe order are to be held until paperwork is received. Once received patient is to be scheduled for fitting in four weeks.

## 2021-03-01 ENCOUNTER — Other Ambulatory Visit: Payer: Self-pay | Admitting: Internal Medicine

## 2021-03-04 DIAGNOSIS — N184 Chronic kidney disease, stage 4 (severe): Secondary | ICD-10-CM

## 2021-03-04 DIAGNOSIS — I129 Hypertensive chronic kidney disease with stage 1 through stage 4 chronic kidney disease, or unspecified chronic kidney disease: Secondary | ICD-10-CM

## 2021-03-04 DIAGNOSIS — E7800 Pure hypercholesterolemia, unspecified: Secondary | ICD-10-CM

## 2021-03-04 DIAGNOSIS — E1122 Type 2 diabetes mellitus with diabetic chronic kidney disease: Secondary | ICD-10-CM | POA: Diagnosis not present

## 2021-03-13 ENCOUNTER — Other Ambulatory Visit: Payer: Self-pay | Admitting: Internal Medicine

## 2021-03-13 DIAGNOSIS — Z1231 Encounter for screening mammogram for malignant neoplasm of breast: Secondary | ICD-10-CM

## 2021-03-17 ENCOUNTER — Encounter: Payer: Medicare PPO | Attending: Internal Medicine | Admitting: Dietician

## 2021-03-17 ENCOUNTER — Other Ambulatory Visit: Payer: Self-pay

## 2021-03-17 ENCOUNTER — Encounter: Payer: Self-pay | Admitting: Dietician

## 2021-03-17 DIAGNOSIS — N184 Chronic kidney disease, stage 4 (severe): Secondary | ICD-10-CM | POA: Diagnosis not present

## 2021-03-17 DIAGNOSIS — E118 Type 2 diabetes mellitus with unspecified complications: Secondary | ICD-10-CM | POA: Diagnosis not present

## 2021-03-17 NOTE — Patient Instructions (Addendum)
Read food labels. ? Avoid foods with added Potassium and any Phos... ingredients ? ?Choose more plant and less animal protein. ?Overall protein goal is 50 grams per day. ?Continue low sodium ?Choose low potassium vegetables and fruits most frequently. ?Stay hydrated.   ? ?Sit down at the table and eat ?Continue to stay active. ? ?

## 2021-03-17 NOTE — Progress Notes (Signed)
Medical Nutrition Therapy  ?Appointment Start time:  5797  Appointment End time:  1145 ?Patient is here alone.   ? ?Primary concerns today: She would like to improve her diet to improve her blood pressure and kidney function (to avoid dialysis) with consistency with her diabetic diet.  ?Referral diagnosis: HTN, CKD, Type 2 Diabetes ?Preferred learning style: no preference indicated ?Learning readiness: readyy ? ? ?NUTRITION ASSESSMENT  ? ?Anthropometrics  ?64" ?144 lbs overall stable  ?UBW 144-148 lbs  ? ?Clinical ?Medical Hx: Type 2 diabetes, HLD, HTN, CKD ?Medications: ozempic ?Labs: A1C 6.1%, microabuminuria/creatinine ratio 3,669, BUN 55, Creatinine 2.53, Potassium 5.1, eGFR 20 on 01/23/2021, Cholesterol 231, HDL 78 LDL 137, Triglycerides, vitamin D 39 10/22/2020 ?Notable Signs/Symptoms: protein in urine, fasting glucose 120 ? ?Lifestyle & Dietary Hx ?Patient lives with her husband.  She does the shopping and cooking.  She is retired Optometrist. 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 ? ?Estimated daily fluid intake: 64 oz water ?Supplements: vitamin C, Vitamin D3 ?Sleep: good (7 hours per night) ?Stress / self-care: high ?Current average weekly physical activity: walks 3-5 times per week for 1.5-2 miles per week and stationary bike 3 days per week for 30 minutes plus hand weights 3 days per week ? ?24-Hr Dietary Recall ?First Meal: skips at times if wakes late OR egg, grits, 1 slice toast, canned salmon OR boiled eggs, toast,  ?Snack: apple ?Second Meal: [place] burger (plain) ?Snack: baked sweet potato ?Third Meal: sweet potato, slaw, [place] burger ?Snack: none ?Beverages: water, occasional cranberry juice ? ?Estimated Energy Needs ?Calories: 1800 ?Protein: 50  g ? ?NUTRITION DIAGNOSIS  ?NB-1.1 Food and nutrition-related knowledge deficit As related to nutrition and renal needs.  As evidenced by diet hx and patient report. ? ? ?NUTRITION INTERVENTION  ?Nutrition education (E-1) on the  following topics:  ?Overview of NKD diet and diabetes.  Balance carbohydrate throughout day. ?Protein needs and CKD ?Review of low sodium and discussion of label reading ?Potassium restriction related to her lab work ?Phosphorous and label reading ?Stress control for HTN ? ?Handouts Provided Include  ?NKD National Kidney Disease Diet Dish up a Kidney Friendly Meal for thos not on dialysis ?CKD stage 3-5 nutrition therapy from AND ? ?Learning Style & Readiness for Change ?Teaching method utilized: Visual & Auditory  ?Demonstrated degree of understanding via: Teach Back  ?Barriers to learning/adherence to lifestyle change: none ? ?Goals Established by Pt ?Read food labels. ? Avoid foods with added Potassium and any Phos... ingredients ? ?Choose more plant and less animal protein. ?Overall protein goal is 50 grams per day. ?Continue low sodium ?Choose low potassium vegetables and fruits most frequently. ?Stay hydrated.   ? ?Sit down at the table and eat ?Continue to stay active. ? ? ?MONITORING & EVALUATION ?Dietary intake, weekly physical activity, and label reading prn. ? ?Next Steps  ?Patient is to call for questions. ?

## 2021-03-19 ENCOUNTER — Telehealth: Payer: Medicare PPO

## 2021-03-19 ENCOUNTER — Telehealth: Payer: Self-pay

## 2021-03-19 NOTE — Telephone Encounter (Signed)
?  Care Management  ? ?Follow Up Note ? ? ?03/19/2021 ?Name: Michelle Cummings MRN: [id number] DOB: [birth year] ? ? ?Referred by: Glendale Chard, MD ?Reason for referral : Chronic Care Management (RN CM Follow up call ) ? ? ?An unsuccessful telephone outreach was attempted today. The patient was referred to the case management team for assistance with care management and care coordination.  ? ?Follow Up Plan: Telephone follow up appointment with care management team member scheduled for: 03/24/21 ? ?Barb Merino, RN, BSN, CCM ?Care Management Coordinator ?[facility] [facility]  ?Direct Phone: [phone] ? ? ?

## 2021-03-24 ENCOUNTER — Telehealth: Payer: Medicare PPO

## 2021-03-24 ENCOUNTER — Ambulatory Visit (INDEPENDENT_AMBULATORY_CARE_PROVIDER_SITE_OTHER): Payer: Medicare PPO

## 2021-03-24 DIAGNOSIS — E7800 Pure hypercholesterolemia, unspecified: Secondary | ICD-10-CM

## 2021-03-24 DIAGNOSIS — E559 Vitamin D deficiency, unspecified: Secondary | ICD-10-CM

## 2021-03-24 DIAGNOSIS — E1122 Type 2 diabetes mellitus with diabetic chronic kidney disease: Secondary | ICD-10-CM

## 2021-03-24 DIAGNOSIS — I129 Hypertensive chronic kidney disease with stage 1 through stage 4 chronic kidney disease, or unspecified chronic kidney disease: Secondary | ICD-10-CM

## 2021-03-24 NOTE — Chronic Care Management (AMB) (Signed)
?Chronic Care Management  ? ?CCM RN Visit Note ? ?03/24/2021 ?Name: Michelle Cummings MRN: [id number] DOB: 1950/04/30 ? ?Subjective: ?Michelle Cummings is a 71 y.o. year old female who is a primary care patient of Dorothyann Peng, MD. The care management team was consulted for assistance with disease management and care coordination needs.   ? ?Engaged with patient by telephone for follow up visit in response to provider referral for case management and/or care coordination services.  ? ?Consent to Services:  ?The patient was given information about Chronic Care Management services, agreed to services, and gave verbal consent prior to initiation of services.  Please see initial visit note for detailed documentation.  ? ?Patient agreed to services and verbal consent obtained.  ? ?Assessment: Review of patient past medical history, allergies, medications, health status, including review of consultants reports, laboratory and other test data, was performed as part of comprehensive evaluation and provision of chronic care management services.  ? ?SDOH (Social Determinants of Health) assessments and interventions performed:   ? ?CCM Care Plan ? ?Allergies  ?Allergen Reactions  ? Shellfish Allergy Itching  ? ? ?Outpatient Encounter Medications as of 03/24/2021  ?Medication Sig  ? ACCU-CHEK GUIDE test strip USE AS DIRECTED TO CHECK BLOOD SUGARS 2 TIMES A DAY DX: E11.22  ? Accu-Chek Softclix Lancets lancets USE AS DIRECTED TO CHECK BLOOD SUGARS 2 TIMES A DAY DX: E11.22  ? allopurinol (ZYLOPRIM) 100 MG tablet TK 2 TS PO D  ? amLODipine (NORVASC) 10 MG tablet TAKE 1 TABLET(10 MG) BY MOUTH DAILY  ? carvedilol (COREG) 6.25 MG tablet Take 6.25 mg by mouth 2 (two) times daily with a meal. Take 2 tablets by mouth in the morning and 2 tablets in the evening.  ? ferrous sulfate 325 (65 FE) MG tablet Take 1 tablet (325 mg total) by mouth daily with breakfast.  ? latanoprost (XALATAN) 0.005 % ophthalmic solution place 1 drop into both eyes at  bedtime  ? omeprazole (PRILOSEC) 40 MG capsule 1 cap(s) orally 20 minutes before breakfast (Patient not taking: Reported on 03/17/2021)  ? OZEMPIC, 0.25 OR 0.5 MG/DOSE, 2 MG/1.5ML SOPN INJECT 0.5MG  INTO THE SKIN ONCE A WEEK  ? timolol (TIMOPTIC-XR) 0.5 % ophthalmic gel-forming Place 1 drop into both eyes daily.  ? valsartan-hydrochlorothiazide (DIOVAN-HCT) 80-12.5 MG tablet TAKE 1 TABLET BY MOUTH DAILY (Patient not taking: Reported on 03/17/2021)  ? vitamin C (ASCORBIC ACID) 500 MG tablet Take 1,000 mg by mouth daily.  ? Vitamin D, Ergocalciferol, (DRISDOL) 1.25 MG (50000 UNIT) CAPS capsule TAKE 1 CAPSULE BY MOUTH EVERY 7 DAYS  ? ?No facility-administered encounter medications on file as of 03/24/2021.  ? ? ?Patient Active Problem List  ? Diagnosis Date Noted  ? Achilles tendinitis of right lower extremity 05/22/2020  ? Great toe pain, left 05/22/2020  ? Hypertensive nephropathy 01/26/2020  ? Abnormal ankle brachial index (ABI) 01/26/2020  ? Adjustment disorder with anxiety 01/26/2020  ? Atherosclerosis of aorta (HCC) 02/01/2018  ? Gout 07/02/2016  ? Anemia of chronic disease 06/07/2016  ? HLD (hyperlipidemia) 06/02/2016  ? Glaucoma 06/02/2016  ? Chronic kidney disease, stage 3, mod decreased GFR (HCC) 06/02/2016  ? HTN (hypertension) 03/05/2011  ? Type 2 diabetes mellitus with stage 3 chronic kidney disease (HCC) 03/05/2011  ? ? ?Conditions to be addressed/monitored: Type II diabetes mellitus with stage 4 chronic kidney disease, Hypertensive nephropathy, Pure Hypercholesterolemia, Vitamin D deficiency  ? ?Care Plan : RN Care Manager Plan of Care  ?Updates made by  Kenrick Pore, Karma Lew, RN since 03/24/2021 12:00 AM  ?  ? ?Problem: No plan established for mangement of chronic disease states (Type II diabetes mellitus with stage 4 chronic kidney disease, Hypertensive nephropathy, Pure Hypercholesterolemia, Vitamin D deficiency)   ?Priority: High  ?  ? ?Long-Range Goal: Development of plan of care for management of chronic  disease states (Type II diabetes mellitus with stage 4 chronic kidney disease, Hypertensive nephropathy, Pure Hypercholesterolemia, Vitamin D deficiency)   ?Start Date: 11/04/2020  ?Expected End Date: 11/04/2020  ?Recent Progress: On track  ?Priority: High  ?Note:   ?Current Barriers:  ?Knowledge Deficits related to plan of care for management of Type II diabetes mellitus with stage 4 chronic kidney disease, Hypertensive nephropathy, Pure Hypercholesterolemia, Vitamin D deficiency  ?Chronic Disease Management support and education needs related to Type II diabetes mellitus with stage 4 chronic kidney disease, Hypertensive nephropathy, Pure Hypercholesterolemia, Vitamin D deficiency  ? ?RNCM Clinical Goal(s):  ?Patient will verbalize basic understanding of  Type II diabetes mellitus with stage 4 chronic kidney disease, Hypertensive nephropathy, Pure Hypercholesterolemia, Vitamin D deficiency  disease process and self health management plan   ?demonstrate Improved health management independence   ?continue to work with RN Care Manager to address care management and care coordination needs related to  Type II diabetes mellitus with stage 4 chronic kidney disease, Hypertensive nephropathy, Pure Hypercholesterolemia, Vitamin D deficiency  ?will demonstrate ongoing self health care management ability    through collaboration with RN Care manager, provider, and care team.  ? ?Interventions: ?1:1 collaboration with primary care provider regarding development and update of comprehensive plan of care as evidenced by provider attestation and co-signature ?Inter-disciplinary care team collaboration (see longitudinal plan of care) ?Evaluation of current treatment plan related to  self management and patient's adherence to plan as established by provider ? ? ?Hyperlipidemia Interventions:  (Status:  Goal on track:  Yes.) Long Term Goal ?Medication review performed; medication list updated in electronic medical record.  ?Provider  established cholesterol goals reviewed ?Provided HLD educational materials ?Reviewed role and benefits of statin for ASCVD risk reduction ?Reviewed importance of limiting foods high in cholesterol ?Reviewed exercise goals and target of 150 minutes per week ?Lipid Panel  ?   ?Component Value Date/Time  ? CHOL 231 (H) 10/22/2020 1151  ? TRIG 90 10/22/2020 1151  ? HDL 78 10/22/2020 1151  ? CHOLHDL 3.0 10/22/2020 1151  ? CHOLHDL 4 06/02/2016 0833  ? VLDL 30.6 06/02/2016 0833  ? LDLCALC 137 (H) 10/22/2020 1151  ? LABVLDL 16 10/22/2020 1151  ?  ? ?Vitamin D deficiency Interventions: Status: (Goal on track:  Yes.) ?Evaluation of current treatment plan related to  Vitamin D deficiency ,  self-management and patient's adherence to plan as established by provider ?Review of patient status, including review of consultant's reports, relevant laboratory and other test results, and medications completed. ?Reviewed medications with patient and discussed importance of medication adherence ?Provided education to patient re: ways to improve Vitamin D; instructed patient to eat vitamin d rich foods, take vitamin d supplement as prescribed, get at least 15 minutes of natural sunlight when possible ; ?Discussed plans with patient for ongoing care management follow up and provided patient with direct contact information for care management team; ?Vit D, 25-Hydroxy 30.0 - 100.0 ng/mL 39.0  26.4 Low  CM   ? ? Chronic Kidney Disease Interventions:  (Status:  Goal on track:  Yes.) Long Term Goal ?Assessed the Patient understanding of chronic kidney disease    ?  Evaluation of current treatment plan related to chronic kidney disease self management and patient's adherence to plan as established by provider      ?Reviewed prescribed diet increase water to 48 oz daily unless otherwise directed ?Advised patient to discuss fluid recommendations with provider    ?Screening for signs and symptoms of depression related to chronic disease state       ?Mailed printed educational materials related to Eating Right with Chronic Kidney disease ?Last practice recorded BP readings:  ?BP Readings from Last 3 Encounters:  ?01/23/21 (!) 144/72  ?11/26/20 128/62  ?11/09

## 2021-03-24 NOTE — Patient Instructions (Signed)
Visit Information ? ?Thank you for taking time to visit with me today. Please don't hesitate to contact me if I can be of assistance to you before our next scheduled telephone appointment. ? ?Following are the goals we discussed today:  ?(Copy and paste patient goals from clinical care plan here) ? ?Our next appointment is by telephone on 06/06/21 at 11:15 AM ? ?Please call the care guide team at [phone] if you need to cancel or reschedule your appointment.  ? ?If you are experiencing a Mental Health or [place] or need someone to talk to, please call 1-800-273-TALK (toll free, 24 hour hotline)  ? ?Patient verbalizes understanding of instructions and care plan provided today and agrees to view in [place]. Active MyChart status confirmed with patient.   ? ?Barb Merino, RN, BSN, CCM ?Care Management Coordinator ?[place] [facility]  ?Direct Phone: [phone] ? ? ?

## 2021-04-03 ENCOUNTER — Telehealth: Payer: Self-pay

## 2021-04-03 NOTE — Telephone Encounter (Signed)
Shoes Ordered - [place] 9.5W ?

## 2021-04-04 DIAGNOSIS — I129 Hypertensive chronic kidney disease with stage 1 through stage 4 chronic kidney disease, or unspecified chronic kidney disease: Secondary | ICD-10-CM

## 2021-04-04 DIAGNOSIS — E7800 Pure hypercholesterolemia, unspecified: Secondary | ICD-10-CM | POA: Diagnosis not present

## 2021-04-04 DIAGNOSIS — E1122 Type 2 diabetes mellitus with diabetic chronic kidney disease: Secondary | ICD-10-CM

## 2021-04-04 DIAGNOSIS — N184 Chronic kidney disease, stage 4 (severe): Secondary | ICD-10-CM | POA: Diagnosis not present

## 2021-04-10 ENCOUNTER — Telehealth: Payer: Self-pay

## 2021-04-10 NOTE — Telephone Encounter (Signed)
Lmom for pt informing her that diabetic shoes have been ordered. The order was placed 04/04/21.

## 2021-04-17 ENCOUNTER — Ambulatory Visit
Admission: RE | Admit: 2021-04-17 | Discharge: 2021-04-17 | Disposition: A | Discharge status: Home / Self Care | Payer: Medicare PPO | Source: Ambulatory Visit | Attending: Internal Medicine | Admitting: Internal Medicine

## 2021-04-17 ENCOUNTER — Ambulatory Visit (INDEPENDENT_AMBULATORY_CARE_PROVIDER_SITE_OTHER): Payer: Medicare PPO

## 2021-04-17 DIAGNOSIS — L84 Corns and callosities: Secondary | ICD-10-CM

## 2021-04-17 DIAGNOSIS — E119 Type 2 diabetes mellitus without complications: Secondary | ICD-10-CM | POA: Diagnosis not present

## 2021-04-17 DIAGNOSIS — M2041 Other hammer toe(s) (acquired), right foot: Secondary | ICD-10-CM

## 2021-04-17 DIAGNOSIS — M2142 Flat foot [pes planus] (acquired), left foot: Secondary | ICD-10-CM

## 2021-04-17 DIAGNOSIS — Z1231 Encounter for screening mammogram for malignant neoplasm of breast: Secondary | ICD-10-CM | POA: Diagnosis not present

## 2021-04-17 DIAGNOSIS — M2042 Other hammer toe(s) (acquired), left foot: Secondary | ICD-10-CM

## 2021-04-17 NOTE — Progress Notes (Signed)
SITUATION ?Reason for Visit: Fitting of Diabetic [place] ?Patient / Caregiver Report:  Patient is satisfied with fit and function of shoes and insoles. ? ?OBJECTIVE DATA: ?Patient History / Diagnosis:   ?  ICD-10-CM   ?1. Diabetes mellitus without complication (HCC)  T62.5   ?  ?2. Hammer toes of both feet  M20.41   ? M20.42   ?  ?3. Pes planus of both feet  M21.41   ? M21.42   ?  ?4. Callus  L84   ?  ? ? ?Change in Status:   None ? ?ACTIONS PERFORMED: ?In-Person Delivery, patient was fit with: ?- 1x pair [id number] PDAC approved prefabricated Diabetic Shoes: Lyle 638 ?- 3x pair [id number] PDAC approved vacuum formed custom diabetic insoles; [employer]: [id number] ? ?Shoes and insoles were verified for structural integrity and safety. Patient wore shoes and insoles in office. Skin was inspected and free of areas of concern after wearing shoes and inserts. Shoes and inserts fit properly. Patient / Caregiver provided with ferbal instruction and demonstration regarding donning, doffing, wear, care, proper fit, function, purpose, cleaning, and use of shoes and insoles ' and in all related precautions and risks and benefits regarding shoes and insoles. Patient / Caregiver was instructed to wear properly fitting socks with shoes at all times. Patient was also provided with verbal instruction regarding how to report any failures or malfunctions of shoes or inserts, and necessary follow up care. Patient / Caregiver was also instructed to contact physician regarding change in status that may affect function of shoes and inserts.  ? ?Patient / Caregiver verbalized undersatnding of instruction provided. Patient / Caregiver demonstrated independence with proper donning and doffing of shoes and inserts. ? ?PLAN ?Patient to follow with treating physician as recommended. Plan of care was discussed with and agreed upon by patient and/or caregiver. All questions were answered and concerns addressed. ? ?

## 2021-04-29 ENCOUNTER — Other Ambulatory Visit: Payer: Self-pay | Admitting: Internal Medicine

## 2021-05-06 DIAGNOSIS — N183 Chronic kidney disease, stage 3 unspecified: Secondary | ICD-10-CM | POA: Diagnosis not present

## 2021-05-06 DIAGNOSIS — N189 Chronic kidney disease, unspecified: Secondary | ICD-10-CM | POA: Diagnosis not present

## 2021-05-06 DIAGNOSIS — I129 Hypertensive chronic kidney disease with stage 1 through stage 4 chronic kidney disease, or unspecified chronic kidney disease: Secondary | ICD-10-CM | POA: Diagnosis not present

## 2021-05-06 DIAGNOSIS — E1122 Type 2 diabetes mellitus with diabetic chronic kidney disease: Secondary | ICD-10-CM | POA: Diagnosis not present

## 2021-05-06 DIAGNOSIS — M109 Gout, unspecified: Secondary | ICD-10-CM | POA: Diagnosis not present

## 2021-05-06 DIAGNOSIS — E875 Hyperkalemia: Secondary | ICD-10-CM | POA: Diagnosis not present

## 2021-05-15 ENCOUNTER — Ambulatory Visit: Payer: Medicare PPO | Admitting: Sports Medicine

## 2021-05-23 ENCOUNTER — Other Ambulatory Visit: Payer: Self-pay | Admitting: Internal Medicine

## 2021-05-29 DIAGNOSIS — N390 Urinary tract infection, site not specified: Secondary | ICD-10-CM | POA: Diagnosis not present

## 2021-05-29 DIAGNOSIS — N183 Chronic kidney disease, stage 3 unspecified: Secondary | ICD-10-CM | POA: Diagnosis not present

## 2021-06-04 ENCOUNTER — Ambulatory Visit: Payer: Medicare PPO | Admitting: Internal Medicine

## 2021-06-04 ENCOUNTER — Encounter: Payer: Self-pay | Admitting: Internal Medicine

## 2021-06-04 VITALS — BP 130/70 | HR 75 | Temp 97.8°F | Ht 64.0 in | Wt 144.0 lb

## 2021-06-04 DIAGNOSIS — Z6824 Body mass index (BMI) 24.0-24.9, adult: Secondary | ICD-10-CM | POA: Diagnosis not present

## 2021-06-04 DIAGNOSIS — M62838 Other muscle spasm: Secondary | ICD-10-CM

## 2021-06-04 DIAGNOSIS — I129 Hypertensive chronic kidney disease with stage 1 through stage 4 chronic kidney disease, or unspecified chronic kidney disease: Secondary | ICD-10-CM | POA: Diagnosis not present

## 2021-06-04 DIAGNOSIS — Z23 Encounter for immunization: Secondary | ICD-10-CM

## 2021-06-04 DIAGNOSIS — E1122 Type 2 diabetes mellitus with diabetic chronic kidney disease: Secondary | ICD-10-CM

## 2021-06-04 DIAGNOSIS — N184 Chronic kidney disease, stage 4 (severe): Secondary | ICD-10-CM | POA: Diagnosis not present

## 2021-06-04 LAB — HEMOGLOBIN A1C
Est. average glucose Bld gHb Est-mCnc: 134 mg/dL
Hgb A1c MFr Bld: 6.3 % — ABNORMAL HIGH (ref 4.8–5.6)

## 2021-06-04 NOTE — Progress Notes (Unsigned)
Rich Brave [place],acting as a Education administrator for [name], MD.,have documented all relevant documentation on the behalf of [name], MD,as directed by  [name], MD while in the presence of [name], MD.  This visit occurred during the SARS-CoV-2 public health emergency.  Safety protocols were in place, including screening questions prior to the visit, additional usage of staff PPE, and extensive cleaning of exam room while observing appropriate contact time as indicated for disinfecting solutions.  Subjective:     Patient ID: Michelle Cummings , female    DOB: 1950/02/02 , 71 y.o.   MRN: [id number]   Chief Complaint  Patient presents with   Diabetes   Hypertension    HPI  The patient is here today for a diabetes and blood pressure f/u.  She reports compliance with meds. She denies having any headaches, chest pain and shortness of breath.  Diabetes She presents for her follow-up diabetic visit. She has type 2 diabetes mellitus. Her disease course has been stable. There are no hypoglycemic associated symptoms. Pertinent negatives for diabetes include no blurred vision, no chest pain, no polydipsia, no polyphagia and no polyuria. There are no hypoglycemic complications. Diabetic complications include nephropathy. Risk factors for coronary artery disease include diabetes mellitus, dyslipidemia, hypertension and post-menopausal. She is following a diabetic diet. She participates in exercise three times a week. Her breakfast blood glucose is taken between 9-10 am. Her breakfast blood glucose range is generally 110-130 mg/dl. An ACE inhibitor/angiotensin II receptor blocker is being taken.  Hypertension This is a chronic problem. The current episode started more than 1 year ago. The problem has been gradually improving since onset. The problem is controlled. Pertinent negatives include no blurred vision or chest pain. Past treatments include angiotensin blockers, diuretics and ACE  inhibitors. The current treatment provides moderate improvement. Hypertensive end-organ damage includes kidney disease.    Past Medical History:  Diagnosis Date   Arthritis    Cataracts, bilateral    Cervical disc disease    Chronic kidney disease    Dr. Hillery Hunter yearly for CKD stage II as of 11/26/11   Diabetes mellitus (HCC)    Eczema    Glaucoma    Heart murmur    History of chicken pox    Hyperlipidemia    Hypertension    Seasonal allergies    Thyroid nodule      Family History  Problem Relation Age of Onset   Hypertension Mother    Early death Father    Breast cancer Sister    Healthy Brother      Current Outpatient Medications:    ACCU-CHEK GUIDE test strip, USE AS DIRECTED TO CHECK BLOOD SUGARS 2 TIMES A DAY DX: E11.22, Disp: 100 each, Rfl: 2   Accu-Chek Softclix Lancets lancets, USE AS DIRECTED TO CHECK BLOOD SUGARS 2 TIMES A DAY DX: E11.22, Disp: 100 each, Rfl: 2   allopurinol (ZYLOPRIM) 100 MG tablet, TK 2 TS PO D, Disp: , Rfl:    amLODipine (NORVASC) 10 MG tablet, TAKE 1 TABLET(10 MG) BY MOUTH DAILY, Disp: 90 tablet, Rfl: 1   carvedilol (COREG) 6.25 MG tablet, Take 6.25 mg by mouth 2 (two) times daily with a meal. Take 2 tablets by mouth in the morning and 2 tablets in the evening., Disp: , Rfl:    ferrous sulfate 325 (65 FE) MG tablet, Take 1 tablet (325 mg total) by mouth daily with breakfast., Disp: 30 tablet, Rfl: 3   latanoprost (XALATAN)  0.005 % ophthalmic solution, place 1 drop into both eyes at bedtime, Disp: , Rfl:    OZEMPIC, 0.25 OR 0.5 MG/DOSE, 2 MG/1.5ML SOPN, INJECT 0.5MG  INTO THE SKIN ONCE A WEEK, Disp: 4.5 mL, Rfl: 1   timolol (TIMOPTIC-XR) 0.5 % ophthalmic gel-forming, Place 1 drop into both eyes daily., Disp: , Rfl:    vitamin C (ASCORBIC ACID) 500 MG tablet, Take 1,000 mg by mouth daily., Disp: , Rfl:    Vitamin D, Ergocalciferol, (DRISDOL) 1.25 MG (50000 UNIT) CAPS capsule, TAKE 1 CAPSULE BY MOUTH EVERY 7 DAYS, Disp: 12 capsule, Rfl: 0    Allergies  Allergen Reactions   Shellfish Allergy Itching     Review of Systems  Constitutional: Negative.   Eyes:  Negative for blurred vision.  Respiratory: Negative.    Cardiovascular: Negative.  Negative for chest pain.  Gastrointestinal: Negative.   Endocrine: Negative for polydipsia, polyphagia and polyuria.  Musculoskeletal:  Positive for myalgias.       C/o R side pain. Feels like a muscle spasm. Denies fall/trauma. Has not tried any OTC meds for relief.   Neurological: Negative.   Psychiatric/Behavioral: Negative.      Today's Vitals   06/04/21 1111  BP: 130/70  Pulse: 75  Temp: 97.8 F (36.6 C)  Weight: 144 lb (65.3 kg)  Height: 5\' 4"  (1.626 m)  PainSc: 0-No pain   Body mass index is 24.72 kg/m.  Wt Readings from Last 3 Encounters:  06/04/21 144 lb (65.3 kg)  03/17/21 144 lb (65.3 kg)  01/23/21 146 lb (66.2 kg)     Objective:  Physical Exam Vitals and nursing note reviewed.  Constitutional:      Appearance: Normal appearance.  HENT:     Head: Normocephalic and atraumatic.  Eyes:     Extraocular Movements: Extraocular movements intact.  Cardiovascular:     Rate and Rhythm: Normal rate and regular rhythm.     Heart sounds: Normal heart sounds.  Pulmonary:     Effort: Pulmonary effort is normal.     Breath sounds: Normal breath sounds.  Musculoskeletal:        General: Tenderness present.     Cervical back: Normal range of motion.  Skin:    General: Skin is warm.  Neurological:     General: No focal deficit present.     Mental Status: She is alert.  Psychiatric:        Mood and Affect: Mood normal.        Behavior: Behavior normal.     Assessment And Plan:     1. Type 2 diabetes mellitus with stage 4 chronic kidney disease, without long-term current use of insulin (HCC) Comments: Chronic, I will check labs as listed below. I will request recent Renal labs from Nephrology, I will have these abstracted into her chart. F/u 4 months. -  Hemoglobin A1c  2. Hypertensive nephropathy Comments: Chronic, controlled. She is encouraged to follow low sodium diet.  She will c/w amlodipine and carvedilol. No longer on ARB due to renal function.   3. Muscle spasm Comments: Advised to try OTC lidocaine patch and encouraged to Cummings well hydrated.  She will let me know if her sx persist. Tylenol prn.   4. Immunization due - Pneumococcal conjugate vaccine 20-valent (Prevnar-20)  5. BMI 24.0-24.9, adult Comments: She is encouraged to aim for at least 150 minutes of exercise per week.    Patient was given opportunity to ask questions. Patient verbalized understanding of the plan and was  able to repeat key elements of the plan. All questions were answered to their satisfaction.   I, [name], MD, have reviewed all documentation for this visit. The documentation on 06/06/21 for the exam, diagnosis, procedures, and orders are all accurate and complete.   IF YOU HAVE BEEN REFERRED TO A SPECIALIST, IT MAY TAKE 1-2 WEEKS TO SCHEDULE/PROCESS THE REFERRAL. IF YOU HAVE NOT HEARD FROM US/SPECIALIST IN TWO WEEKS, PLEASE GIVE [place] A CALL AT [phone].   THE PATIENT IS ENCOURAGED TO PRACTICE SOCIAL DISTANCING DUE TO THE COVID-19 PANDEMIC.

## 2021-06-04 NOTE — Patient Instructions (Signed)

## 2021-06-06 ENCOUNTER — Telehealth: Payer: Self-pay

## 2021-06-06 ENCOUNTER — Telehealth: Payer: Medicare PPO

## 2021-06-06 NOTE — Telephone Encounter (Signed)
  Care Management   Follow Up Note   06/06/2021 Name: Michelle Cummings MRN: [id number] DOB: [birth year]   Referred by: Glendale Chard, MD Reason for referral : Chronic Care Management (RN CM Follow up call )   An unsuccessful telephone outreach was attempted today. The patient was referred to the case management team for assistance with care management and care coordination.   Follow Up Plan: A HIPPA compliant phone message was left for the patient providing contact information and requesting a return call.   Barb Merino, RN, BSN, CCM Care Management Coordinator [place] [facility]  Direct Phone: [phone]

## 2021-06-23 ENCOUNTER — Ambulatory Visit (INDEPENDENT_AMBULATORY_CARE_PROVIDER_SITE_OTHER): Payer: Medicare PPO

## 2021-06-23 ENCOUNTER — Telehealth: Payer: Medicare PPO

## 2021-06-23 DIAGNOSIS — I129 Hypertensive chronic kidney disease with stage 1 through stage 4 chronic kidney disease, or unspecified chronic kidney disease: Secondary | ICD-10-CM

## 2021-06-23 DIAGNOSIS — E1122 Type 2 diabetes mellitus with diabetic chronic kidney disease: Secondary | ICD-10-CM

## 2021-06-23 DIAGNOSIS — E7800 Pure hypercholesterolemia, unspecified: Secondary | ICD-10-CM

## 2021-06-23 DIAGNOSIS — E559 Vitamin D deficiency, unspecified: Secondary | ICD-10-CM

## 2021-06-23 NOTE — Patient Instructions (Signed)
Visit Information  Thank you for taking time to visit with me today. Please don't hesitate to contact me if I can be of assistance to you before our next scheduled telephone appointment.  Following are the goals we discussed today:  (Copy and paste patient goals from clinical care plan here)  Our next appointment is by telephone on 11/06/21 at 2:15 PM   Please call the care guide team at [phone] if you need to cancel or reschedule your appointment.   If you are experiencing a Mental Health or [place] or need someone to talk to, please call [phone]-TALK (toll free, 24 hour hotline)   Patient verbalizes understanding of instructions and care plan provided today and agrees to view in [place]. Active MyChart status and patient understanding of how to access instructions and care plan via MyChart confirmed with patient.     Barb Merino, RN, BSN, CCM Care Management Coordinator [place] [facility]  Direct Phone: [phone]

## 2021-06-23 NOTE — Chronic Care Management (AMB) (Signed)
Chronic Care Management   CCM RN Visit Note  06/23/2021 Name: ZAIDY ABSHER MRN: [id number] DOB: [birth year]  Subjective: Michelle Cummings is a 71 y.o. year old female who is a primary care patient of Glendale Chard, MD. The care management team was consulted for assistance with disease management and care coordination needs.    Engaged with patient by telephone for follow up visit in response to provider referral for case management and/or care coordination services.   Consent to Services:  The patient was given information about Chronic Care Management services, agreed to services, and gave verbal consent prior to initiation of services.  Please see initial visit note for detailed documentation.   Patient agreed to services and verbal consent obtained.   Assessment: Review of patient past medical history, allergies, medications, health status, including review of consultants reports, laboratory and other test data, was performed as part of comprehensive evaluation and provision of chronic care management services.   SDOH (Social Determinants of Health) assessments and interventions performed:  Yes, no acute needs   CCM Care Plan  Allergies  Allergen Reactions   Shellfish Allergy Itching    Outpatient Encounter Medications as of 06/23/2021  Medication Sig   ACCU-CHEK GUIDE test strip USE AS DIRECTED TO CHECK BLOOD SUGARS 2 TIMES A DAY DX: E11.22   Accu-Chek Softclix Lancets lancets USE AS DIRECTED TO CHECK BLOOD SUGARS 2 TIMES A DAY DX: E11.22   allopurinol (ZYLOPRIM) 100 MG tablet TK 2 TS PO D   amLODipine (NORVASC) 10 MG tablet TAKE 1 TABLET(10 MG) BY MOUTH DAILY   carvedilol (COREG) 6.25 MG tablet Take 6.25 mg by mouth 2 (two) times daily with a meal. Take 2 tablets by mouth in the morning and 2 tablets in the evening.   ferrous sulfate 325 (65 FE) MG tablet Take 1 tablet (325 mg total) by mouth daily with breakfast.   latanoprost (XALATAN) 0.005 % ophthalmic solution place 1 drop  into both eyes at bedtime   OZEMPIC, 0.25 OR 0.5 MG/DOSE, 2 MG/1.5ML SOPN INJECT 0.5MG INTO THE SKIN ONCE A WEEK   timolol (TIMOPTIC-XR) 0.5 % ophthalmic gel-forming Place 1 drop into both eyes daily.   vitamin C (ASCORBIC ACID) 500 MG tablet Take 1,000 mg by mouth daily.   Vitamin D, Ergocalciferol, (DRISDOL) 1.25 MG (50000 UNIT) CAPS capsule TAKE 1 CAPSULE BY MOUTH EVERY 7 DAYS   No facility-administered encounter medications on file as of 06/23/2021.    Patient Active Problem List   Diagnosis Date Noted   Achilles tendinitis of right lower extremity 05/22/2020   Great toe pain, left 05/22/2020   Hypertensive nephropathy 01/26/2020   Abnormal ankle brachial index (ABI) 01/26/2020   Adjustment disorder with anxiety 01/26/2020   Atherosclerosis of aorta ([place]) 02/01/2018   Gout 07/02/2016   Anemia of chronic disease 06/07/2016   HLD (hyperlipidemia) 06/02/2016   Glaucoma 06/02/2016   Chronic kidney disease, stage 3, mod decreased GFR ([place]) 06/02/2016   HTN (hypertension) 03/05/2011   Type 2 diabetes mellitus with stage 3 chronic kidney disease ([place]) 03/05/2011    Conditions to be addressed/monitored: Type II diabetes mellitus with stage 4 chronic kidney disease, Hypertensive nephropathy, Pure Hypercholesterolemia, Vitamin D deficiency   Care Plan : RN Care Manager Plan of Care  Updates made by Lynne Logan, RN since 06/23/2021 12:00 AM     Problem: No plan established for mangement of chronic disease states (Type II diabetes mellitus with stage 4 chronic kidney disease, Hypertensive nephropathy, Pure  Hypercholesterolemia, Vitamin D deficiency)   Priority: High     Long-Range Goal: Development of plan of care for management of chronic disease states (Type II diabetes mellitus with stage 4 chronic kidney disease, Hypertensive nephropathy, Pure Hypercholesterolemia, Vitamin D deficiency)   Start Date: 11/04/2020  Expected End Date: 11/04/2020  Recent Progress: On track  Priority:  High  Note:   Current Barriers:  Knowledge Deficits related to plan of care for management of Type II diabetes mellitus with stage 4 chronic kidney disease, Hypertensive nephropathy, Pure Hypercholesterolemia, Vitamin D deficiency  Chronic Disease Management support and education needs related to Type II diabetes mellitus with stage 4 chronic kidney disease, Hypertensive nephropathy, Pure Hypercholesterolemia, Vitamin D deficiency   RNCM Clinical Goal(s):  Patient will verbalize basic understanding of  Type II diabetes mellitus with stage 4 chronic kidney disease, Hypertensive nephropathy, Pure Hypercholesterolemia, Vitamin D deficiency  disease process and self health management plan   demonstrate Improved health management independence   continue to work with RN Care Manager to address care management and care coordination needs related to  Type II diabetes mellitus with stage 4 chronic kidney disease, Hypertensive nephropathy, Pure Hypercholesterolemia, Vitamin D deficiency  will demonstrate ongoing self health care management ability    through collaboration with RN Care manager, provider, and care team.   Interventions: 1:1 collaboration with primary care provider regarding development and update of comprehensive plan of care as evidenced by provider attestation and co-signature Inter-disciplinary care team collaboration (see longitudinal plan of care) Evaluation of current treatment plan related to  self management and patient's adherence to plan as established by provider  Hyperlipidemia Interventions:  (Status:  Condition stable.  Not addressed this visit.) Long Term Goal Medication review performed; medication list updated in electronic medical record.  Provider established cholesterol goals reviewed Provided HLD educational materials Reviewed role and benefits of statin for ASCVD risk reduction Reviewed importance of limiting foods high in cholesterol Reviewed exercise goals and  target of 150 minutes per week Lipid Panel     Component Value Date/Time   CHOL 231 (H) 10/22/2020 1151   TRIG 90 10/22/2020 1151   HDL 78 10/22/2020 1151   CHOLHDL 3.0 10/22/2020 1151   CHOLHDL 4 06/02/2016 0833   VLDL 30.6 06/02/2016 0833   LDLCALC 137 (H) 10/22/2020 1151   LABVLDL 16 10/22/2020 1151     Vitamin D deficiency Interventions: Status: (Condition stable.  Not addressed this visit.) Evaluation of current treatment plan related to  Vitamin D deficiency ,  self-management and patient's adherence to plan as established by provider Review of patient status, including review of consultant's reports, relevant laboratory and other test results, and medications completed. Reviewed medications with patient and discussed importance of medication adherence Provided education to patient re: ways to improve Vitamin D; instructed patient to eat vitamin d rich foods, take vitamin d supplement as prescribed, get at least 15 minutes of natural sunlight when possible ; Discussed plans with patient for ongoing care management follow up and provided patient with direct contact information for care management team; Vit D, 25-Hydroxy 30.0 - 100.0 ng/mL 39.0  26.4 Low  CM     Chronic Kidney Disease Interventions:  (Status:  Goal on track:  Yes.) Long Term Goal Assessed the Patient understanding of chronic kidney disease    Evaluation of current treatment plan related to chronic kidney disease self management and patient's adherence to plan as established by provider      Advised patient to discuss fluid  intake with kidney provider    Assessed social determinant of health barriers, none identified     Provided education on kidney disease progression with next stage of disease to reach stage V (ESRD)   Engage patient in early, proactive and ongoing discussion about goals of care and what matters most to them    Educated patient on basic disease process related to chronic kidney disease, including  stages of chronic kidney disease  Educated patient on signs/symptoms of stage IV kidney disease and when to notify her kidney doctor, including symptoms such as high blood pressure, bone disease and heart disease, swelling of your hands and feet or abdomen and or pain in your lower back Educated patient on importance to follow up with her Nephrologist on a regular basis as directed  Discussed next upcoming appointment with Nephrology is scheduled with Dr. Clover Mealy on 07/12/21 Last practice recorded BP readings:  BP Readings from Last 3 Encounters:  06/04/21 130/70  01/23/21 (!) 144/72  11/26/20 128/62  Most recent eGFR/CrCl:  Lab Results  Component Value Date   EGFR 20 (L) 01/23/2021    No components found for: "CRCL"   Diabetes Interventions:  (Status:  Goal on track:  Yes.) Long Term Goal Assessed patient's understanding of A1c goal: <6.5% Provided education to patient about basic DM disease process Reviewed medications with patient and discussed importance of medication adherence Counseled on importance of regular laboratory monitoring as prescribed Review of patient status, including review of consultants reports, relevant laboratory and other test results, and medications completed Educated patient on using the Plate Method and portion control for best dietary regimen Educated patient with rationale on the dangers of skipping meals and how this may affect her diabetes Mailed printed educational materials related to Diabetes and Kidney disease  Lab Results  Component Value Date   HGBA1C 6.3 (H) 06/04/2021   Hypertension Interventions:  (Status:  Condition stable.  Not addressed this visit.) Long Term Goal Last practice recorded BP readings:  BP Readings from Last 3 Encounters:  06/04/21 130/70  01/23/21 (!) 144/72  11/26/20 128/62  Most recent eGFR/CrCl:  Lab Results  Component Value Date   EGFR 20 (L) 01/23/2021    No components found for: "CRCL"  Evaluation of current  treatment plan related to hypertension self management and patient's adherence to plan as established by provider Reviewed medications with patient and discussed importance of compliance Counseled on the importance of exercise goals with target of 150 minutes per week Advised patient, providing education and rationale, to monitor blood pressure daily and record, calling PCP for findings outside established parameters Provided education on prescribed diet low Sodium Discussed complications of poorly controlled blood pressure such as heart disease, stroke, circulatory complications, vision complications, kidney impairment, sexual dysfunction Mailed printed educational materials related to Full Body workout with Chair Exercises; High Blood Pressure and Kidney disease   Patient Goals/Self-Care Activities: Take all medications as prescribed Attend all scheduled provider appointments Call pharmacy for medication refills 3-7 days in advance of running out of medications Perform all self care activities independently  Perform IADL's (shopping, preparing meals, housekeeping, managing finances) independently Call provider office for new concerns or questions  schedule appointment with eye doctor check feet daily for cuts, sores or redness drink 6 to 8 glasses of water each day fill half of plate with vegetables manage portion size check blood pressure 3 times per week write blood pressure results in a log or diary keep a blood pressure log take medications  for blood pressure exactly as prescribed report new symptoms to your doctor  Follow Up Plan:  Telephone follow up appointment with care management team member scheduled for:  11/06/21     Barb Merino, RN, BSN, CCM Care Management Coordinator [place] [facility]  Direct Phone: [phone]

## 2021-07-04 DIAGNOSIS — E1122 Type 2 diabetes mellitus with diabetic chronic kidney disease: Secondary | ICD-10-CM | POA: Diagnosis not present

## 2021-07-04 DIAGNOSIS — I129 Hypertensive chronic kidney disease with stage 1 through stage 4 chronic kidney disease, or unspecified chronic kidney disease: Secondary | ICD-10-CM | POA: Diagnosis not present

## 2021-07-04 DIAGNOSIS — E785 Hyperlipidemia, unspecified: Secondary | ICD-10-CM | POA: Diagnosis not present

## 2021-07-04 DIAGNOSIS — Z7985 Long-term (current) use of injectable non-insulin antidiabetic drugs: Secondary | ICD-10-CM | POA: Diagnosis not present

## 2021-07-04 DIAGNOSIS — N184 Chronic kidney disease, stage 4 (severe): Secondary | ICD-10-CM | POA: Diagnosis not present

## 2021-07-15 ENCOUNTER — Ambulatory Visit: Payer: Medicare PPO | Admitting: Podiatry

## 2021-07-15 ENCOUNTER — Encounter: Payer: Self-pay | Admitting: Podiatry

## 2021-07-15 DIAGNOSIS — E119 Type 2 diabetes mellitus without complications: Secondary | ICD-10-CM

## 2021-07-15 DIAGNOSIS — R141 Gas pain: Secondary | ICD-10-CM | POA: Insufficient documentation

## 2021-07-15 DIAGNOSIS — B351 Tinea unguium: Secondary | ICD-10-CM

## 2021-07-15 DIAGNOSIS — R195 Other fecal abnormalities: Secondary | ICD-10-CM | POA: Insufficient documentation

## 2021-07-15 DIAGNOSIS — Z1211 Encounter for screening for malignant neoplasm of colon: Secondary | ICD-10-CM | POA: Insufficient documentation

## 2021-07-15 DIAGNOSIS — M79674 Pain in right toe(s): Secondary | ICD-10-CM

## 2021-07-15 DIAGNOSIS — M79675 Pain in left toe(s): Secondary | ICD-10-CM | POA: Diagnosis not present

## 2021-07-15 DIAGNOSIS — L84 Corns and callosities: Secondary | ICD-10-CM

## 2021-07-15 DIAGNOSIS — Z860100 Personal history of colon polyps, unspecified: Secondary | ICD-10-CM | POA: Insufficient documentation

## 2021-07-15 DIAGNOSIS — D509 Iron deficiency anemia, unspecified: Secondary | ICD-10-CM | POA: Insufficient documentation

## 2021-07-15 DIAGNOSIS — K219 Gastro-esophageal reflux disease without esophagitis: Secondary | ICD-10-CM | POA: Insufficient documentation

## 2021-07-15 NOTE — Progress Notes (Signed)
  Subjective:  Patient ID: Michelle Cummings, female    DOB: August 31, 1950,  MRN: [id number]  AMANDALEE Cummings presents to clinic today for preventative diabetic foot care  Last A1c was 6.3%. Patient did not check blood glucose today.  New problem(s): None.   PCP is Glendale Chard, MD , and last visit was  January 23, 2021  Allergies  Allergen Reactions   Shellfish Allergy Itching    Review of Systems: Negative except as noted in the HPI.  Objective: No changes noted in today's physical examination.  Vascular Examination: CFT <3 seconds b/l LE. Palpable DP pulse(s) b/l LE. Faintly palpable PT pulse(s) b/l LE. Pedal hair present. No pain with calf compression b/l. Lower extremity skin temperature gradient within normal limits. No edema noted b/l LE.  Neurological Examination: Sensation grossly intact b/l with 10 gram monofilament. Vibratory sensation intact b/l.   Dermatological Examination: Pedal skin with normal turgor, texture and tone b/l. Toenails 1-5 b/l thick, discolored, elongated with subungual debris and pain on dorsal palpation. Hyperkeratotic lesion(s) submet head 1 left foot and submet head 5 b/l.  No erythema, no edema, no drainage, no fluctuance.  Musculoskeletal Examination: Muscle strength 5/5 to b/l LE. Hammertoe deformity noted 2-5 b/l. Pes planus deformity noted bilateral LE. Wearing diabetic shoes today.  Radiographs: None  Last A1c:      Latest Ref Rng & Units 06/04/2021   12:06 PM 01/23/2021   11:51 AM 10/22/2020   11:51 AM 08/12/2020   10:48 AM  Hemoglobin A1C  Hemoglobin-A1c 4.8 - 5.6 % 6.3  6.1  6.4  6.2    Assessment/Plan: 1. Pain due to onychomycosis of toenails of both feet   2. Callus   3. Diabetes mellitus without complication ([place])      -Patient was evaluated and treated. All patient's and/or POA's questions/concerns answered on today's visit. -Continue foot and shoe inspections daily. Monitor blood glucose per PCP/Endocrinologist's  recommendations. -Mycotic toenails 1-5 bilaterally were debrided in length and girth with sterile nail nippers and dremel without incident. -Callus(es) submet head 1 left foot and submet head 5 b/l pared utilizing sterile scalpel blade without complication or incident. Total number debrided =3. -Patient/POA to call should there be question/concern in the interim.   Return in about 3 months (around 10/15/2021).  Marzetta Board, DPM

## 2021-07-23 DIAGNOSIS — E875 Hyperkalemia: Secondary | ICD-10-CM | POA: Diagnosis not present

## 2021-07-23 DIAGNOSIS — I129 Hypertensive chronic kidney disease with stage 1 through stage 4 chronic kidney disease, or unspecified chronic kidney disease: Secondary | ICD-10-CM | POA: Diagnosis not present

## 2021-07-23 DIAGNOSIS — E1122 Type 2 diabetes mellitus with diabetic chronic kidney disease: Secondary | ICD-10-CM | POA: Diagnosis not present

## 2021-07-23 DIAGNOSIS — M109 Gout, unspecified: Secondary | ICD-10-CM | POA: Diagnosis not present

## 2021-07-23 DIAGNOSIS — N390 Urinary tract infection, site not specified: Secondary | ICD-10-CM | POA: Diagnosis not present

## 2021-07-23 DIAGNOSIS — N183 Chronic kidney disease, stage 3 unspecified: Secondary | ICD-10-CM | POA: Diagnosis not present

## 2021-07-24 ENCOUNTER — Other Ambulatory Visit: Payer: Self-pay | Admitting: Internal Medicine

## 2021-08-07 ENCOUNTER — Other Ambulatory Visit (HOSPITAL_COMMUNITY): Payer: Self-pay | Admitting: *Deleted

## 2021-08-08 ENCOUNTER — Ambulatory Visit (HOSPITAL_COMMUNITY)
Admission: RE | Admit: 2021-08-08 | Discharge: 2021-08-08 | Disposition: A | Discharge status: Home / Self Care | Payer: Medicare PPO | Source: Ambulatory Visit | Attending: Nephrology | Admitting: Nephrology

## 2021-08-08 DIAGNOSIS — D631 Anemia in chronic kidney disease: Secondary | ICD-10-CM | POA: Diagnosis not present

## 2021-08-08 DIAGNOSIS — N184 Chronic kidney disease, stage 4 (severe): Secondary | ICD-10-CM | POA: Diagnosis not present

## 2021-08-08 MED ORDER — SODIUM CHLORIDE 0.9 % IV SOLN
510.0000 mg | INTRAVENOUS | Status: DC
  Administered 2021-08-08: 510 mg via INTRAVENOUS
  Filled 2021-08-08: qty 510

## 2021-08-12 DIAGNOSIS — E113292 Type 2 diabetes mellitus with mild nonproliferative diabetic retinopathy without macular edema, left eye: Secondary | ICD-10-CM | POA: Diagnosis not present

## 2021-08-12 DIAGNOSIS — H401131 Primary open-angle glaucoma, bilateral, mild stage: Secondary | ICD-10-CM | POA: Diagnosis not present

## 2021-08-12 DIAGNOSIS — H25013 Cortical age-related cataract, bilateral: Secondary | ICD-10-CM | POA: Diagnosis not present

## 2021-08-12 DIAGNOSIS — H524 Presbyopia: Secondary | ICD-10-CM | POA: Diagnosis not present

## 2021-08-12 LAB — HM DIABETES EYE EXAM

## 2021-08-14 ENCOUNTER — Encounter: Payer: Self-pay | Admitting: Internal Medicine

## 2021-08-15 ENCOUNTER — Encounter (HOSPITAL_COMMUNITY): Payer: Medicare PPO

## 2021-08-19 ENCOUNTER — Encounter (HOSPITAL_COMMUNITY)
Admission: RE | Admit: 2021-08-19 | Discharge: 2021-08-19 | Disposition: A | Discharge status: Home / Self Care | Payer: Medicare PPO | Source: Ambulatory Visit | Attending: Nephrology | Admitting: Nephrology

## 2021-08-19 DIAGNOSIS — D631 Anemia in chronic kidney disease: Secondary | ICD-10-CM | POA: Insufficient documentation

## 2021-08-19 DIAGNOSIS — N184 Chronic kidney disease, stage 4 (severe): Secondary | ICD-10-CM | POA: Diagnosis not present

## 2021-08-19 MED ORDER — SODIUM CHLORIDE 0.9 % IV SOLN
510.0000 mg | INTRAVENOUS | Status: DC
  Administered 2021-08-19: 510 mg via INTRAVENOUS
  Filled 2021-08-19: qty 510

## 2021-08-22 NOTE — Progress Notes (Signed)
This encounter was created in error - please disregard.  This encounter was created in error - please disregard.

## 2021-09-09 DIAGNOSIS — I129 Hypertensive chronic kidney disease with stage 1 through stage 4 chronic kidney disease, or unspecified chronic kidney disease: Secondary | ICD-10-CM | POA: Diagnosis not present

## 2021-09-09 DIAGNOSIS — N2581 Secondary hyperparathyroidism of renal origin: Secondary | ICD-10-CM | POA: Diagnosis not present

## 2021-09-09 DIAGNOSIS — M109 Gout, unspecified: Secondary | ICD-10-CM | POA: Diagnosis not present

## 2021-09-09 DIAGNOSIS — N184 Chronic kidney disease, stage 4 (severe): Secondary | ICD-10-CM | POA: Diagnosis not present

## 2021-09-09 DIAGNOSIS — E1122 Type 2 diabetes mellitus with diabetic chronic kidney disease: Secondary | ICD-10-CM | POA: Diagnosis not present

## 2021-09-09 DIAGNOSIS — E875 Hyperkalemia: Secondary | ICD-10-CM | POA: Diagnosis not present

## 2021-09-09 LAB — BASIC METABOLIC PANEL
BUN: 82 — AB (ref 4–21)
CO2: 21 (ref 13–22)
Chloride: 98 — AB (ref 99–108)
Creatinine: 4.4 — AB (ref 0.5–1.1)
Glucose: 102
Potassium: 4.6 mEq/L (ref 3.5–5.1)
Sodium: 132 — AB (ref 137–147)

## 2021-09-09 LAB — COMPREHENSIVE METABOLIC PANEL
Calcium: 9.8 (ref 8.7–10.7)
eGFR: 10

## 2021-09-09 LAB — IRON,TIBC AND FERRITIN PANEL
%SAT: 30
Ferritin: 709
Iron: 72
TIBC: 240
UIBC: 168
UIBC: 168

## 2021-09-15 DIAGNOSIS — H409 Unspecified glaucoma: Secondary | ICD-10-CM | POA: Diagnosis not present

## 2021-09-15 DIAGNOSIS — E785 Hyperlipidemia, unspecified: Secondary | ICD-10-CM | POA: Diagnosis not present

## 2021-09-15 DIAGNOSIS — Z823 Family history of stroke: Secondary | ICD-10-CM | POA: Diagnosis not present

## 2021-09-15 DIAGNOSIS — Z8249 Family history of ischemic heart disease and other diseases of the circulatory system: Secondary | ICD-10-CM | POA: Diagnosis not present

## 2021-09-15 DIAGNOSIS — E119 Type 2 diabetes mellitus without complications: Secondary | ICD-10-CM | POA: Diagnosis not present

## 2021-09-15 DIAGNOSIS — Z7985 Long-term (current) use of injectable non-insulin antidiabetic drugs: Secondary | ICD-10-CM | POA: Diagnosis not present

## 2021-09-15 DIAGNOSIS — M109 Gout, unspecified: Secondary | ICD-10-CM | POA: Diagnosis not present

## 2021-09-15 DIAGNOSIS — I10 Essential (primary) hypertension: Secondary | ICD-10-CM | POA: Diagnosis not present

## 2021-09-18 ENCOUNTER — Encounter: Payer: Self-pay | Admitting: Nurse Practitioner

## 2021-09-18 ENCOUNTER — Ambulatory Visit: Payer: Medicare PPO | Admitting: Nurse Practitioner

## 2021-09-18 VITALS — BP 130/68 | HR 74 | Temp 97.7°F | Ht 64.0 in | Wt 140.0 lb

## 2021-09-18 DIAGNOSIS — R142 Eructation: Secondary | ICD-10-CM

## 2021-09-18 MED ORDER — DICLOFENAC SODIUM 1 % EX GEL: 2.0000 g | Freq: Four times a day (QID) | CUTANEOUS | 2 refills | Status: DC

## 2021-09-18 NOTE — Patient Instructions (Addendum)

## 2021-09-18 NOTE — Progress Notes (Signed)
I,Tianna Badgett,acting as a Education administrator for [employer], FNP.,have documented all relevant documentation on the behalf of Minette Brine, FNP,as directed by  Minette Brine, FNP while in the presence of Minette Brine, [place].  Subjective:     Patient ID: Michelle Cummings , female    DOB: Jun 28, 1950 , 71 y.o.   MRN: [id number]   Chief Complaint  Patient presents with   Gastroesophageal Reflux    HPI  The patient is here today for treatment for acid reflux. When she belches she has pain. Started over the weekend. She has not been eating any spicy foods. She states that she has had symptoms since last week. She can still feel the sensation when she drinks water or when she eats on right side underneath her rib cage. She sleeps well at night but when wakes up can feel the sensation. She has not tried any medications. Dr. Moshe Cipro started her on Calcium about 2 months ago.     Past Medical History:  Diagnosis Date   Arthritis    Cataracts, bilateral    Cervical disc disease    Chronic kidney disease    Dr. Hillery Hunter yearly for CKD stage II as of 11/26/11   Diabetes mellitus ([place])    Eczema    Glaucoma    Heart murmur    History of chicken pox    Hyperlipidemia    Hypertension    Seasonal allergies    Thyroid nodule      Family History  Problem Relation Age of Onset   Hypertension Mother    Early death Father    Breast cancer Sister    Healthy Brother      Current Outpatient Medications:    diclofenac Sodium (VOLTAREN) 1 % GEL, Apply 2 g topically 4 (four) times daily., Disp: 100 g, Rfl: 2   ACCU-CHEK GUIDE test strip, USE AS DIRECTED TO CHECK BLOOD SUGARS 2 TIMES A DAY DX: E11.22, Disp: 100 each, Rfl: 2   Accu-Chek Softclix Lancets lancets, USE AS DIRECTED TO CHECK BLOOD SUGARS 2 TIMES A DAY DX: E11.22, Disp: 100 each, Rfl: 2   allopurinol (ZYLOPRIM) 100 MG tablet, TK 2 TS PO D, Disp: , Rfl:    amLODipine (NORVASC) 10 MG tablet, TAKE 1 TABLET(10 MG) BY MOUTH DAILY, Disp: 90  tablet, Rfl: 1   carvedilol (COREG) 12.5 MG tablet, Take 12.5 mg by mouth 2 (two) times daily with a meal. Take 2 tablets by mouth in the morning and 2 tablets in the evening., Disp: , Rfl:    ferrous sulfate 325 (65 FE) MG tablet, Take 1 tablet (325 mg total) by mouth daily with breakfast., Disp: 30 tablet, Rfl: 3   latanoprost (XALATAN) 0.005 % ophthalmic solution, place 1 drop into both eyes at bedtime, Disp: , Rfl:    OZEMPIC, 0.25 OR 0.5 MG/DOSE, 2 MG/1.5ML SOPN, INJECT 0.5MG  INTO THE SKIN ONCE A WEEK, Disp: 4.5 mL, Rfl: 1   timolol (TIMOPTIC-XR) 0.5 % ophthalmic gel-forming, Place 1 drop into both eyes daily., Disp: , Rfl:    vitamin C (ASCORBIC ACID) 500 MG tablet, Take 1,000 mg by mouth daily., Disp: , Rfl:    Vitamin D, Ergocalciferol, (DRISDOL) 1.25 MG (50000 UNIT) CAPS capsule, TAKE 1 CAPSULE BY MOUTH EVERY 7 DAYS, Disp: 12 capsule, Rfl: 0   Allergies  Allergen Reactions   Shellfish Allergy Itching     Review of Systems  Constitutional: Negative.   Respiratory: Negative.    Cardiovascular: Negative.  Negative for chest  pain, palpitations and leg swelling.  Gastrointestinal:  Positive for constipation (Last bowel movement was today and feels normal).  Neurological: Negative.   Psychiatric/Behavioral: Negative.       Today's Vitals   09/18/21 1547  BP: 130/68  Pulse: 74  Temp: 97.7 F (36.5 C)  TempSrc: Oral  Weight: 140 lb (63.5 kg)  Height: 5\' 4"  (1.626 m)   Body mass index is 24.03 kg/m.   Objective:  Physical Exam Vitals reviewed.  Constitutional:      Appearance: Normal appearance.  Cardiovascular:     Rate and Rhythm: Normal rate and regular rhythm.     Pulses: Normal pulses.     Heart sounds: Normal heart sounds. No murmur heard. Pulmonary:     Effort: Pulmonary effort is normal. No respiratory distress.     Breath sounds: Normal breath sounds. No wheezing.  Skin:    General: Skin is warm and dry.     Capillary Refill: Capillary refill takes less than  2 seconds.  Neurological:     General: No focal deficit present.     Mental Status: She is alert and oriented to person, place, and time.     Cranial Nerves: No cranial nerve deficit.     Motor: No weakness.  Psychiatric:        Mood and Affect: Mood normal.        Behavior: Behavior normal.        Thought Content: Thought content normal.        Judgment: Judgment normal.         Assessment And Plan:     1. Belching Comments: Encouraged to take probiotic and avoid fried and fatty foods.  Will check for gallbladder issues.  - Lipase - Amylase   Patient was given opportunity to ask questions. Patient verbalized understanding of the plan and was able to repeat key elements of the plan. All questions were answered to their satisfaction.  Minette Brine, FNP   I, Minette Brine, FNP, have reviewed all documentation for this visit. The documentation on 09/18/21 for the exam, diagnosis, procedures, and orders are all accurate and complete.   IF YOU HAVE BEEN REFERRED TO A SPECIALIST, IT MAY TAKE 1-2 WEEKS TO SCHEDULE/PROCESS THE REFERRAL. IF YOU HAVE NOT HEARD FROM US/SPECIALIST IN TWO WEEKS, PLEASE GIVE [place] A CALL AT [phone].   THE PATIENT IS ENCOURAGED TO PRACTICE SOCIAL DISTANCING DUE TO THE COVID-19 PANDEMIC.

## 2021-09-19 LAB — LIPASE: Lipase: 252 U/L — ABNORMAL HIGH (ref 14–85)

## 2021-09-19 LAB — AMYLASE: Amylase: 174 U/L — ABNORMAL HIGH (ref 31–110)

## 2021-09-30 ENCOUNTER — Other Ambulatory Visit: Payer: Self-pay | Admitting: Nurse Practitioner

## 2021-09-30 ENCOUNTER — Other Ambulatory Visit: Payer: Self-pay

## 2021-09-30 DIAGNOSIS — R142 Eructation: Secondary | ICD-10-CM

## 2021-09-30 DIAGNOSIS — Z79899 Other long term (current) drug therapy: Secondary | ICD-10-CM

## 2021-10-01 NOTE — Addendum Note (Signed)
Addended by: Minette Brine F on: 10/01/2021 04:28 PM   Modules accepted: Orders

## 2021-10-14 ENCOUNTER — Other Ambulatory Visit: Payer: Medicare PPO

## 2021-10-14 DIAGNOSIS — Z79899 Other long term (current) drug therapy: Secondary | ICD-10-CM

## 2021-10-15 LAB — HEPATIC FUNCTION PANEL
ALT: 12 IU/L (ref 0–32)
AST: 13 IU/L (ref 0–40)
Albumin: 4.3 g/dL (ref 3.8–4.8)
Alkaline Phosphatase: 106 IU/L (ref 44–121)
Bilirubin Total: <0.2 mg/dL (ref 0.0–1.2)
Bilirubin, Direct: <0.1 mg/dL (ref 0.00–0.40)
Total Protein: 7 g/dL (ref 6.0–8.5)

## 2021-10-15 LAB — LIPASE: Lipase: 250 U/L — ABNORMAL HIGH (ref 14–85)

## 2021-10-15 LAB — AMYLASE: Amylase: 212 U/L — ABNORMAL HIGH (ref 31–110)

## 2021-10-16 ENCOUNTER — Telehealth: Payer: Self-pay | Admitting: *Deleted

## 2021-10-16 NOTE — Chronic Care Management (AMB) (Signed)
  Care Coordination Note  10/16/2021 Name: [name] MRN: [id number] DOB: November 05, 1950  Michelle Cummings is a 71 y.o. year old female who is a primary care patient of Glendale Chard, MD and is actively engaged with the care management team. I reached out to [name] by phone today to assist with re-scheduling a follow up visit with the RN Case Manager  Follow up plan: Unsuccessful telephone outreach attempt made. A HIPAA compliant phone message was left for the patient providing contact information and requesting a return call.  The care management team will reach out to the patient again over the next 7 days.   [name]  Direct Dial: [phone]

## 2021-10-17 ENCOUNTER — Other Ambulatory Visit: Payer: Self-pay | Admitting: Internal Medicine

## 2021-10-17 ENCOUNTER — Ambulatory Visit: Payer: Medicare PPO | Admitting: Podiatry

## 2021-10-17 DIAGNOSIS — R748 Abnormal levels of other serum enzymes: Secondary | ICD-10-CM

## 2021-10-20 ENCOUNTER — Encounter: Payer: Self-pay | Admitting: Internal Medicine

## 2021-10-20 ENCOUNTER — Other Ambulatory Visit: Payer: Self-pay | Admitting: Internal Medicine

## 2021-10-21 DIAGNOSIS — I120 Hypertensive chronic kidney disease with stage 5 chronic kidney disease or end stage renal disease: Secondary | ICD-10-CM | POA: Diagnosis not present

## 2021-10-21 DIAGNOSIS — E875 Hyperkalemia: Secondary | ICD-10-CM | POA: Diagnosis not present

## 2021-10-21 DIAGNOSIS — N185 Chronic kidney disease, stage 5: Secondary | ICD-10-CM | POA: Diagnosis not present

## 2021-10-21 DIAGNOSIS — E1122 Type 2 diabetes mellitus with diabetic chronic kidney disease: Secondary | ICD-10-CM | POA: Diagnosis not present

## 2021-10-21 DIAGNOSIS — N2581 Secondary hyperparathyroidism of renal origin: Secondary | ICD-10-CM | POA: Diagnosis not present

## 2021-10-21 DIAGNOSIS — M109 Gout, unspecified: Secondary | ICD-10-CM | POA: Diagnosis not present

## 2021-10-21 LAB — BASIC METABOLIC PANEL
BUN: 82 — AB (ref 4–21)
CO2: 18 (ref 13–22)
Chloride: 100 (ref 99–108)
Creatinine: 4.1 — AB (ref 0.5–1.1)
Glucose: 121
Potassium: 4.9 mEq/L (ref 3.5–5.1)
Sodium: 136 — AB (ref 137–147)

## 2021-10-21 LAB — COMPREHENSIVE METABOLIC PANEL
Albumin: 4.3 (ref 3.5–5.0)
Calcium: 9.6 (ref 8.7–10.7)
eGFR: 11

## 2021-10-21 LAB — IRON,TIBC AND FERRITIN PANEL
%SAT: 28
Iron: 67
TIBC: 242
UIBC: 175

## 2021-10-21 LAB — CBC AND DIFFERENTIAL: Hemoglobin: 10.1 — AB (ref 12.0–16.0)

## 2021-10-22 ENCOUNTER — Other Ambulatory Visit: Payer: Self-pay | Admitting: Internal Medicine

## 2021-10-22 DIAGNOSIS — R748 Abnormal levels of other serum enzymes: Secondary | ICD-10-CM

## 2021-10-22 NOTE — Chronic Care Management (AMB) (Signed)
  Care Coordination Note  10/22/2021 Name: Michelle Cummings MRN: [id number] DOB: [birth year]  Michelle Cummings is a 71 y.o. year old female who is a primary care patient of Glendale Chard, MD and is actively engaged with the care management team. I reached out to [name] by phone today to assist with re-scheduling a follow up visit with the RN Case Manager  Follow up plan: Telephone appointment with care management team member scheduled for:11/13/21  [place]: [phone]

## 2021-10-26 ENCOUNTER — Other Ambulatory Visit: Payer: Self-pay | Admitting: Internal Medicine

## 2021-10-28 ENCOUNTER — Ambulatory Visit
Admission: RE | Admit: 2021-10-28 | Discharge: 2021-10-28 | Disposition: A | Discharge status: Home / Self Care | Payer: Medicare PPO | Source: Ambulatory Visit | Attending: Internal Medicine | Admitting: Internal Medicine

## 2021-10-28 DIAGNOSIS — R748 Abnormal levels of other serum enzymes: Secondary | ICD-10-CM

## 2021-10-28 DIAGNOSIS — N281 Cyst of kidney, acquired: Secondary | ICD-10-CM | POA: Diagnosis not present

## 2021-11-01 ENCOUNTER — Encounter: Payer: Self-pay | Admitting: Podiatry

## 2021-11-01 ENCOUNTER — Ambulatory Visit (INDEPENDENT_AMBULATORY_CARE_PROVIDER_SITE_OTHER): Payer: Medicare PPO | Admitting: Podiatry

## 2021-11-01 DIAGNOSIS — B351 Tinea unguium: Secondary | ICD-10-CM | POA: Diagnosis not present

## 2021-11-01 DIAGNOSIS — M79674 Pain in right toe(s): Secondary | ICD-10-CM

## 2021-11-01 DIAGNOSIS — E119 Type 2 diabetes mellitus without complications: Secondary | ICD-10-CM | POA: Diagnosis not present

## 2021-11-01 DIAGNOSIS — M79675 Pain in left toe(s): Secondary | ICD-10-CM | POA: Diagnosis not present

## 2021-11-01 DIAGNOSIS — L84 Corns and callosities: Secondary | ICD-10-CM

## 2021-11-01 NOTE — Progress Notes (Signed)
  Subjective:  Patient ID: Michelle Cummings, female    DOB: December 28, 1950,  MRN: [id number]  Michelle Cummings presents to clinic today for preventative diabetic foot care.  Chief Complaint  Patient presents with   Nail Problem    DFC  BG - 130 yesterday morning  A1C - 6.08 July 2021 PCP - Dr Baird Cancer last OV July 2023, appt on 10/31    New problem(s): None.   PCP is Glendale Chard, MD.  Allergies  Allergen Reactions   Shellfish Allergy Itching    Review of Systems: Negative except as noted in the HPI.  Objective: No changes noted in today's physical examination.  Michelle Cummings is a pleasant 71 y.o. female WD, WN in NAD. AAO x 3.  Vascular Examination: CFT <3 seconds b/l LE. Palpable DP pulse(s) b/l LE. Faintly palpable PT pulse(s) b/l LE. Pedal hair present. No pain with calf compression b/l. Lower extremity skin temperature gradient within normal limits. No edema noted b/l LE.  Neurological Examination: Sensation grossly intact b/l with 10 gram monofilament. Vibratory sensation intact b/l.   Dermatological Examination: Pedal skin with normal turgor, texture and tone b/l. Toenails 1-5 b/l thick, discolored, elongated with subungual debris and pain on dorsal palpation.   Hyperkeratotic lesion(s) submet head 1 left foot and submet head 5 b/l.  No erythema, no edema, no drainage, no fluctuance.  Musculoskeletal Examination: Muscle strength 5/5 to b/l LE. Hammertoe deformity noted 2-5 b/l. Pes planus deformity noted bilateral LE. Wearing diabetic shoes today.  Radiographs: None  Assessment/Plan: 1. Pain due to onychomycosis of toenails of both feet   2. Callus   3. Diabetes mellitus without complication ([place])     No orders of the defined types were placed in this encounter.   -Consent given for treatment as described below: -Examined patient. -Toenails 1-5 b/l were debrided in length and girth with sterile nail nippers and dremel without iatrogenic bleeding.  -Callus(es) submet head  1 left foot and submet head 5 b/l pared utilizing sterile scalpel blade without complication or incident. Total number debrided =3. -Patient/POA to call should there be question/concern in the interim.   Return in about 3 months (around 02/01/2022).  Marzetta Board, DPM

## 2021-11-04 ENCOUNTER — Other Ambulatory Visit: Payer: Self-pay | Admitting: Internal Medicine

## 2021-11-04 ENCOUNTER — Ambulatory Visit (INDEPENDENT_AMBULATORY_CARE_PROVIDER_SITE_OTHER): Payer: Medicare PPO | Admitting: Internal Medicine

## 2021-11-04 ENCOUNTER — Encounter: Payer: Self-pay | Admitting: Internal Medicine

## 2021-11-04 VITALS — BP 150/68 | HR 66 | Temp 97.7°F | Ht 64.0 in | Wt 143.4 lb

## 2021-11-04 DIAGNOSIS — E1122 Type 2 diabetes mellitus with diabetic chronic kidney disease: Secondary | ICD-10-CM | POA: Diagnosis not present

## 2021-11-04 DIAGNOSIS — E7800 Pure hypercholesterolemia, unspecified: Secondary | ICD-10-CM | POA: Diagnosis not present

## 2021-11-04 DIAGNOSIS — N184 Chronic kidney disease, stage 4 (severe): Secondary | ICD-10-CM

## 2021-11-04 DIAGNOSIS — Z23 Encounter for immunization: Secondary | ICD-10-CM | POA: Diagnosis not present

## 2021-11-04 DIAGNOSIS — Z0000 Encounter for general adult medical examination without abnormal findings: Secondary | ICD-10-CM

## 2021-11-04 DIAGNOSIS — I129 Hypertensive chronic kidney disease with stage 1 through stage 4 chronic kidney disease, or unspecified chronic kidney disease: Secondary | ICD-10-CM | POA: Diagnosis not present

## 2021-11-04 LAB — POCT URINALYSIS DIPSTICK
Bilirubin, UA: NEGATIVE
Glucose, UA: NEGATIVE
Ketones, UA: NEGATIVE
Nitrite, UA: NEGATIVE
Protein, UA: POSITIVE — AB
Spec Grav, UA: 1.01 (ref 1.010–1.025)
Urobilinogen, UA: 0.2 E.U./dL
pH, UA: 6.5 (ref 5.0–8.0)

## 2021-11-04 MED ORDER — HYDRALAZINE HCL 25 MG PO TABS: ORAL_TABLET | ORAL | 0 refills | Status: DC

## 2021-11-04 NOTE — Progress Notes (Signed)
[name],acting as a Education administrator for Michelle Greenland, MD.,have documented all relevant documentation on the behalf of Michelle Greenland, MD,as directed by  Michelle Greenland, MD while in the presence of Michelle Greenland, MD.   Subjective:     Patient ID: Michelle Cummings , female    DOB: October 23, 1950 , 71 y.o.   MRN: [id number]   Chief Complaint  Patient presents with   Annual Exam   Diabetes   Hypertension    HPI  Patient presents today for HM.  She reports compliance with meds. She denies headaches, chest pain and shortness of breath.  She is followed by Dr. Moshe Cipro for CKD.   BP Readings from Last 3 Encounters: 11/04/21 : (!) 150/70 09/18/21 : 130/68 08/19/21 : (!) 178/54    Diabetes She presents for her follow-up diabetic visit. She has type 2 diabetes mellitus. Her disease course has been stable. There are no hypoglycemic associated symptoms. Pertinent negatives for diabetes include no blurred vision and no chest pain. There are no hypoglycemic complications. Diabetic complications include nephropathy. Risk factors for coronary artery disease include diabetes mellitus, dyslipidemia, hypertension and post-menopausal. She is following a diabetic diet. She participates in exercise three times a week. Her breakfast blood glucose is taken between 9-10 am. Her breakfast blood glucose range is generally 110-130 mg/dl. An ACE inhibitor/angiotensin II receptor blocker is being taken.  Hypertension This is a chronic problem. The current episode started more than 1 year ago. The problem has been gradually improving since onset. The problem is controlled. Pertinent negatives include no blurred vision or chest pain. Past treatments include angiotensin blockers, diuretics and ACE inhibitors. The current treatment provides moderate improvement. Hypertensive end-organ damage includes kidney disease.     Past Medical History:  Diagnosis Date   Arthritis    Cataracts, bilateral    Cervical disc  disease    Chronic kidney disease    Dr. Hillery Hunter yearly for CKD stage II as of 11/26/11   Diabetes mellitus ([facility])    Eczema    Glaucoma    Heart murmur    History of chicken pox    Hyperlipidemia    Hypertension    Seasonal allergies    Thyroid nodule      Family History  Problem Relation Age of Onset   Hypertension Mother    Early death Father    Breast cancer Sister    Healthy Brother      Current Outpatient Medications:    ACCU-CHEK GUIDE test strip, USE AS DIRECTED TO CHECK BLOOD SUGARS 2 TIMES A DAY DX: E11.22, Disp: 100 each, Rfl: 2   Accu-Chek Softclix Lancets lancets, USE AS DIRECTED TO CHECK BLOOD SUGARS 2 TIMES A DAY DX: E11.22, Disp: 100 each, Rfl: 2   allopurinol (ZYLOPRIM) 100 MG tablet, TK 2 TS PO D, Disp: , Rfl:    amLODipine (NORVASC) 10 MG tablet, TAKE 1 TABLET(10 MG) BY MOUTH DAILY, Disp: 90 tablet, Rfl: 1   carvedilol (COREG) 12.5 MG tablet, Take 12.5 mg by mouth 2 (two) times daily with a meal. Take 2 tablets by mouth in the morning and 2 tablets in the evening., Disp: , Rfl:    diclofenac Sodium (VOLTAREN) 1 % GEL, Apply 2 g topically 4 (four) times daily., Disp: 100 g, Rfl: 2   ferrous sulfate 325 (65 FE) MG tablet, Take 1 tablet (325 mg total) by mouth daily with breakfast., Disp: 30 tablet, Rfl: 3   latanoprost (XALATAN) 0.005 %  ophthalmic solution, place 1 drop into both eyes at bedtime, Disp: , Rfl:    OZEMPIC, 0.25 OR 0.5 MG/DOSE, 2 MG/1.5ML SOPN, INJECT 0.5MG  INTO THE SKIN ONCE A WEEK, Disp: 4.5 mL, Rfl: 1   Pitavastatin Calcium (LIVALO) 4 MG TABS, TAKE 1 TABLET DAILY. M-F, Disp: 30 tablet, Rfl: 1   timolol (TIMOPTIC-XR) 0.5 % ophthalmic gel-forming, Place 1 drop into both eyes daily., Disp: , Rfl:    vitamin C (ASCORBIC ACID) 500 MG tablet, Take 1,000 mg by mouth daily., Disp: , Rfl:    Vitamin D, Ergocalciferol, (DRISDOL) 1.25 MG (50000 UNIT) CAPS capsule, TAKE 1 CAPSULE BY MOUTH EVERY 7 DAYS, Disp: 12 capsule, Rfl: 0   ZYPITAMAG 4 MG TABS, Take  1 tablet by mouth daily., Disp: 30 tablet, Rfl: 2   hydrALAZINE (APRESOLINE) 25 MG tablet, TAKE 1 TABLET BY MOUTH TWICE DAILY, Disp: 90 tablet, Rfl: 1   Allergies  Allergen Reactions   Shellfish Allergy Itching      The patient states she uses none for birth control. Last LMP was No LMP recorded. Patient is postmenopausal.. Negative for Dysmenorrhea. Negative for: breast discharge, breast lump(s), breast pain and breast self exam. Associated symptoms include abnormal vaginal bleeding. Pertinent negatives include abnormal bleeding (hematology), anxiety, decreased libido, depression, difficulty falling sleep, dyspareunia, history of infertility, nocturia, sexual dysfunction, sleep disturbances, urinary incontinence, urinary urgency, vaginal discharge and vaginal itching. Diet regular.The patient states her exercise level is  intermittent.  . The patient's tobacco use is:  Social History   Tobacco Use  Smoking Status Never  Smokeless Tobacco Never  . She has been exposed to passive smoke. The patient's alcohol use is:  Social History   Substance and Sexual Activity  Alcohol Use No    Review of Systems  Constitutional: Negative.   HENT: Negative.    Eyes: Negative.  Negative for blurred vision.  Respiratory: Negative.    Cardiovascular: Negative.  Negative for chest pain.  Gastrointestinal: Negative.   Endocrine: Negative.   Genitourinary: Negative.   Musculoskeletal: Negative.   Skin: Negative.   Allergic/Immunologic: Negative.   Neurological: Negative.   Hematological: Negative.   Psychiatric/Behavioral: Negative.       Today's Vitals   11/04/21 0853 11/04/21 0904  BP: (!) 150/70 (!) 150/68  Pulse: 66   Temp: 97.7 F (36.5 C)   TempSrc: Oral   Weight: 143 lb 6.4 oz (65 kg)   Height: 5\' 4"  (1.626 m)   PainSc: 0-No pain    Body mass index is 24.61 kg/m.  Wt Readings from Last 3 Encounters:  11/04/21 143 lb 6.4 oz (65 kg)  09/18/21 140 lb (63.5 kg)  06/04/21 144 lb  (65.3 kg)    Objective:  Physical Exam Vitals and nursing note reviewed.  Constitutional:      Appearance: Normal appearance.  HENT:     Head: Normocephalic and atraumatic.     Right Ear: Tympanic membrane, ear canal and external ear normal.     Left Ear: Tympanic membrane, ear canal and external ear normal.     Nose:     Comments: MASKED    Mouth/Throat:     Comments: MASKED  Eyes:     Extraocular Movements: Extraocular movements intact.     Conjunctiva/sclera: Conjunctivae normal.     Pupils: Pupils are equal, round, and reactive to light.  Cardiovascular:     Rate and Rhythm: Normal rate and regular rhythm.     Pulses: Normal pulses.  Heart sounds: Normal heart sounds.  Pulmonary:     Effort: Pulmonary effort is normal.     Breath sounds: Normal breath sounds.  Abdominal:     General: Abdomen is flat. Bowel sounds are normal.     Palpations: Abdomen is soft.  Genitourinary:    Comments: deferred Musculoskeletal:        General: Normal range of motion.     Cervical back: Normal range of motion and neck supple.  Skin:    General: Skin is warm and dry.  Neurological:     General: No focal deficit present.     Mental Status: She is alert and oriented to person, place, and time.  Psychiatric:        Mood and Affect: Mood normal.        Behavior: Behavior normal.       Assessment And Plan:     1. Annual physical exam Comments: A full exam was performed. Importance of monthly self breast exams was discussed with the patient.  PATIENT IS ADVISED TO GET 30-45 MINUTES REGULAR EXERCISE NO LESS THAN FOUR TO FIVE DAYS PER WEEK - BOTH WEIGHTBEARING EXERCISES AND AEROBIC ARE RECOMMENDED.  PATIENT IS ADVISED TO FOLLOW A HEALTHY DIET WITH AT LEAST SIX FRUITS/VEGGIES PER DAY, DECREASE INTAKE OF RED MEAT, AND TO INCREASE FISH INTAKE TO TWO DAYS PER WEEK.  MEATS/FISH SHOULD NOT BE FRIED, BAKED OR BROILED IS PREFERABLE.  IT IS ALSO IMPORTANT TO CUT BACK ON YOUR SUGAR INTAKE. PLEASE  AVOID ANYTHING WITH ADDED SUGAR, CORN SYRUP OR OTHER SWEETENERS. IF YOU MUST USE A SWEETENER, YOU CAN TRY STEVIA. IT IS ALSO IMPORTANT TO AVOID ARTIFICIALLY SWEETENERS AND DIET BEVERAGES. LASTLY, I SUGGEST WEARING SPF 50 SUNSCREEN ON EXPOSED PARTS AND ESPECIALLY WHEN IN THE DIRECT SUNLIGHT FOR AN EXTENDED PERIOD OF TIME.  PLEASE AVOID FAST FOOD RESTAURANTS AND INCREASE YOUR WATER INTAKE.  2. Type 2 diabetes mellitus with stage 4 chronic kidney disease, without long-term current use of insulin ([facility]) Comments: Chronic, diabetic foot exam was performed. I will check labs as below, she will f/u in 3-4 months. Ozempic on hold due to elevated amylase/lipase. However, this may also be due to worsening CKD. I will consult with her nephrologist to get further input. I DISCUSSED WITH THE PATIENT AT LENGTH REGARDING THE GOALS OF GLYCEMIC CONTROL AND POSSIBLE LONG-TERM COMPLICATIONS.  I  ALSO STRESSED THE IMPORTANCE OF COMPLIANCE WITH HOME GLUCOSE MONITORING, DIETARY RESTRICTIONS INCLUDING AVOIDANCE OF SUGARY DRINKS/PROCESSED FOODS,  ALONG WITH REGULAR EXERCISE.  I  ALSO STRESSED THE IMPORTANCE OF ANNUAL EYE EXAMS, SELF FOOT CARE AND COMPLIANCE WITH OFFICE VISITS.  - EKG 12-Lead - Microalbumin / Creatinine Urine Ratio - POCT Urinalysis Dipstick (81002) - Hemoglobin A1c  3. Hypertensive nephropathy Comments: Chronic, uncontrolled. EKG performed, NSR w/ RAE.  She states her BP has been quite elevated at Renal office as well, she expressed her concerns, yet meds not adjusted.  I will add hydralazine 25mg  po bid. She agrees to rto in 2 weeks for a nurse visit. Importance of salt restriction was again stressed to the patient.   4. Pure hypercholesterolemia Comments: Chronic, she is currently taking Livalo 4mg  daily. LDL goal <70. - Lipid panel  5. Need for influenza vaccination - Flu Vaccine QUAD High Dose(Fluad)  Patient was given opportunity to ask questions. Patient verbalized understanding of the plan and was  able to repeat key elements of the plan. All questions were answered to their satisfaction.   I, Theda Belfast  Baird Cancer, MD, have reviewed all documentation for this visit. The documentation on 11/11/21 for the exam, diagnosis, procedures, and orders are all accurate and complete.   THE PATIENT IS ENCOURAGED TO PRACTICE SOCIAL DISTANCING DUE TO THE COVID-19 PANDEMIC.

## 2021-11-04 NOTE — Patient Instructions (Signed)

## 2021-11-05 LAB — LIPID PANEL
Chol/HDL Ratio: 3 ratio (ref 0.0–4.4)
Cholesterol, Total: 243 mg/dL — ABNORMAL HIGH (ref 100–199)
HDL: 82 mg/dL (ref >39)
LDL Chol Calc (NIH): 149 mg/dL — ABNORMAL HIGH (ref 0–99)
Triglycerides: 73 mg/dL (ref 0–149)
VLDL Cholesterol Cal: 12 mg/dL (ref 5–40)

## 2021-11-05 LAB — MICROALBUMIN / CREATININE URINE RATIO
Creatinine, Urine: 28.9 mg/dL
Microalb/Creat Ratio: 1834 mg/g creat — ABNORMAL HIGH (ref 0–29)
Microalbumin, Urine: 530.1 ug/mL

## 2021-11-05 LAB — HEMOGLOBIN A1C
Est. average glucose Bld gHb Est-mCnc: 126 mg/dL
Hgb A1c MFr Bld: 6 % — ABNORMAL HIGH (ref 4.8–5.6)

## 2021-11-07 ENCOUNTER — Other Ambulatory Visit: Payer: Self-pay

## 2021-11-07 MED ORDER — LIVALO 4 MG PO TABS: ORAL_TABLET | ORAL | 1 refills | Status: DC

## 2021-11-10 ENCOUNTER — Other Ambulatory Visit: Payer: Self-pay

## 2021-11-10 MED ORDER — ZYPITAMAG 4 MG PO TABS: 1.0000 | ORAL_TABLET | Freq: Every day | ORAL | 2 refills | Status: DC

## 2021-11-13 ENCOUNTER — Ambulatory Visit: Payer: Self-pay

## 2021-11-13 NOTE — Patient Outreach (Signed)
  Care Coordination   11/13/2021 Name: Michelle Cummings MRN: [id number] DOB: 06/05/1950   Care Coordination Outreach Attempts:  An unsuccessful telephone outreach was attempted for a scheduled appointment today.  Follow Up Plan:  Additional outreach attempts will be made to offer the patient care coordination information and services.   Encounter Outcome:  No Answer  Care Coordination Interventions Activated:  No   Care Coordination Interventions:  No, not indicated    Barb Merino, RN, BSN, CCM Care Management Coordinator [facility] Care Management  Direct Phone: [phone]

## 2021-11-17 DIAGNOSIS — I120 Hypertensive chronic kidney disease with stage 5 chronic kidney disease or end stage renal disease: Secondary | ICD-10-CM | POA: Diagnosis not present

## 2021-11-17 DIAGNOSIS — M109 Gout, unspecified: Secondary | ICD-10-CM | POA: Diagnosis not present

## 2021-11-17 DIAGNOSIS — E875 Hyperkalemia: Secondary | ICD-10-CM | POA: Diagnosis not present

## 2021-11-17 DIAGNOSIS — N2581 Secondary hyperparathyroidism of renal origin: Secondary | ICD-10-CM | POA: Diagnosis not present

## 2021-11-17 DIAGNOSIS — E1122 Type 2 diabetes mellitus with diabetic chronic kidney disease: Secondary | ICD-10-CM | POA: Diagnosis not present

## 2021-11-17 DIAGNOSIS — N185 Chronic kidney disease, stage 5: Secondary | ICD-10-CM | POA: Diagnosis not present

## 2021-11-18 ENCOUNTER — Ambulatory Visit: Payer: Medicare PPO

## 2021-11-18 VITALS — BP 136/70 | HR 76 | Temp 98.2°F | Ht 64.0 in | Wt 143.0 lb

## 2021-11-18 DIAGNOSIS — I10 Essential (primary) hypertension: Secondary | ICD-10-CM

## 2021-11-18 NOTE — Progress Notes (Signed)
Patient presents today for a bp check. Patient is currently taking amLODipine 10mg , HydrALAZINE 25mg , carvedilol 12.5mg . BP Readings from Last 3 Encounters:  11/18/21 (!) 140/70  11/04/21 (!) 150/68  09/18/21 130/68  Patient will follow up in Feb.

## 2021-11-20 ENCOUNTER — Ambulatory Visit (INDEPENDENT_AMBULATORY_CARE_PROVIDER_SITE_OTHER): Payer: Medicare PPO

## 2021-11-20 ENCOUNTER — Telehealth: Payer: Self-pay

## 2021-11-20 VITALS — BP 140/60 | HR 69 | Temp 97.8°F | Ht 63.0 in | Wt 148.2 lb

## 2021-11-20 DIAGNOSIS — Z0000 Encounter for general adult medical examination without abnormal findings: Secondary | ICD-10-CM | POA: Diagnosis not present

## 2021-11-20 NOTE — Telephone Encounter (Signed)
This nurse attempted to call patient for AWV. Message left for her to call to reschedule.

## 2021-11-20 NOTE — Patient Instructions (Signed)
Michelle Cummings , Thank you for taking time to come for your Medicare Wellness Visit. I appreciate your ongoing commitment to your health goals. Please review the following plan we discussed and let me know if I can assist you in the future.    Screening recommendations/referrals: Colonoscopy: completed 01/31/2018  Mammogram: completed 04/17/2021, due 04/19/2022 Bone Density: completed 01/22/2017 Recommended yearly ophthalmology/optometry visit for glaucoma screening and checkup Recommended yearly dental visit for hygiene and checkup  Vaccinations: Influenza vaccine: completed 11/04/2021 Pneumococcal vaccine: completed 06/04/2021 Tdap vaccine: completed 10/24/2012, due 10/25/2022 Shingles vaccine: needs second dose   Covid-19: 06/22/2020, 10/31/2019, 02/17/2019, 01/27/2019  Advanced directives: Please bring a copy of your POA (Power of Attorney) and/or Living Will to your next appointment.    Conditions/risks identified: none  Next appointment: Follow up in one year for your annual wellness visit    Preventive Care 65 Years and Older, Female Preventive care refers to lifestyle choices and visits with your health care provider that can promote health and wellness. What does preventive care include? A yearly physical exam. This is also called an annual well check. Dental exams once or twice a year. Routine eye exams. Ask your health care provider how often you should have your eyes checked. Personal lifestyle choices, including: Daily care of your teeth and gums. Regular physical activity. Eating a healthy diet. Avoiding tobacco and drug use. Limiting alcohol use. Practicing safe sex. Taking low-dose aspirin every day. Taking vitamin and mineral supplements as recommended by your health care provider. What happens during an annual well check? The services and screenings done by your health care provider during your annual well check will depend on your age, overall health, lifestyle risk  factors, and family history of disease. Counseling  Your health care provider may ask you questions about your: Alcohol use. Tobacco use. Drug use. Emotional well-being. Home and relationship well-being. Sexual activity. Eating habits. History of falls. Memory and ability to understand (cognition). Work and work Statistician. Reproductive health. Screening  You may have the following tests or measurements: Height, weight, and BMI. Blood pressure. Lipid and cholesterol levels. These may be checked every 5 years, or more frequently if you are over 10 years old. Skin check. Lung cancer screening. You may have this screening every year starting at age 97 if you have a 30-pack-year history of smoking and currently smoke or have quit within the past 15 years. Fecal occult blood test (FOBT) of the stool. You may have this test every year starting at age 41. Flexible sigmoidoscopy or colonoscopy. You may have a sigmoidoscopy every 5 years or a colonoscopy every 10 years starting at age 68. Hepatitis C blood test. Hepatitis B blood test. Sexually transmitted disease (STD) testing. Diabetes screening. This is done by checking your blood sugar (glucose) after you have not eaten for a while (fasting). You may have this done every 1-3 years. Bone density scan. This is done to screen for osteoporosis. You may have this done starting at age 68. Mammogram. This may be done every 1-2 years. Talk to your health care provider about how often you should have regular mammograms. Talk with your health care provider about your test results, treatment options, and if necessary, the need for more tests. Vaccines  Your health care provider may recommend certain vaccines, such as: Influenza vaccine. This is recommended every year. Tetanus, diphtheria, and acellular pertussis (Tdap, Td) vaccine. You may need a Td booster every 10 years. Zoster vaccine. You may need this after age  60. Pneumococcal 13-valent  conjugate (PCV13) vaccine. One dose is recommended after age 74. Pneumococcal polysaccharide (PPSV23) vaccine. One dose is recommended after age 41. Talk to your health care provider about which screenings and vaccines you need and how often you need them. This information is not intended to replace advice given to you by your health care provider. Make sure you discuss any questions you have with your health care provider. Document Released: 01/18/2015 Document Revised: 09/11/2015 Document Reviewed: 10/23/2014 Elsevier Interactive Patient Education  2017 [place] Prevention in the Home Falls can cause injuries. They can happen to people of all ages. There are many things you can do to make your home safe and to help prevent falls. What can I do on the outside of my home? Regularly fix the edges of walkways and driveways and fix any cracks. Remove anything that might make you trip as you walk through a door, such as a raised step or threshold. Trim any bushes or trees on the path to your home. Use bright outdoor lighting. Clear any walking paths of anything that might make someone trip, such as rocks or tools. Regularly check to see if handrails are loose or broken. Make sure that both sides of any steps have handrails. Any raised decks and porches should have guardrails on the edges. Have any leaves, snow, or ice cleared regularly. Use sand or salt on walking paths during winter. Clean up any spills in your garage right away. This includes oil or grease spills. What can I do in the bathroom? Use night lights. Install grab bars by the toilet and in the tub and shower. Do not use towel bars as grab bars. Use non-skid mats or decals in the tub or shower. If you need to sit down in the shower, use a plastic, non-slip stool. Keep the floor dry. Clean up any water that spills on the floor as soon as it happens. Remove soap buildup in the tub or shower regularly. Attach bath mats  securely with double-sided non-slip rug tape. Do not have throw rugs and other things on the floor that can make you trip. What can I do in the bedroom? Use night lights. Make sure that you have a light by your bed that is easy to reach. Do not use any sheets or blankets that are too big for your bed. They should not hang down onto the floor. Have a firm chair that has side arms. You can use this for support while you get dressed. Do not have throw rugs and other things on the floor that can make you trip. What can I do in the kitchen? Clean up any spills right away. Avoid walking on wet floors. Keep items that you use a lot in easy-to-reach places. If you need to reach something above you, use a strong step stool that has a grab bar. Keep electrical cords out of the way. Do not use floor polish or wax that makes floors slippery. If you must use wax, use non-skid floor wax. Do not have throw rugs and other things on the floor that can make you trip. What can I do with my stairs? Do not leave any items on the stairs. Make sure that there are handrails on both sides of the stairs and use them. Fix handrails that are broken or loose. Make sure that handrails are as long as the stairways. Check any carpeting to make sure that it is firmly attached to the stairs. Fix  any carpet that is loose or worn. Avoid having throw rugs at the top or bottom of the stairs. If you do have throw rugs, attach them to the floor with carpet tape. Make sure that you have a light switch at the top of the stairs and the bottom of the stairs. If you do not have them, ask someone to add them for you. What else can I do to help prevent falls? Wear shoes that: Do not have high heels. Have rubber bottoms. Are comfortable and fit you well. Are closed at the toe. Do not wear sandals. If you use a stepladder: Make sure that it is fully opened. Do not climb a closed stepladder. Make sure that both sides of the stepladder  are locked into place. Ask someone to hold it for you, if possible. Clearly mark and make sure that you can see: Any grab bars or handrails. First and last steps. Where the edge of each step is. Use tools that help you move around (mobility aids) if they are needed. These include: Canes. Walkers. Scooters. Crutches. Turn on the lights when you go into a dark area. Replace any light bulbs as soon as they burn out. Set up your furniture so you have a clear path. Avoid moving your furniture around. If any of your floors are uneven, fix them. If there are any pets around you, be aware of where they are. Review your medicines with your doctor. Some medicines can make you feel dizzy. This can increase your chance of falling. Ask your doctor what other things that you can do to help prevent falls. This information is not intended to replace advice given to you by your health care provider. Make sure you discuss any questions you have with your health care provider. Document Released: 10/18/2008 Document Revised: 05/30/2015 Document Reviewed: 01/26/2014 Elsevier Interactive Patient Education  2017 [employer].

## 2021-11-20 NOTE — Progress Notes (Signed)
Subjective:   Michelle Cummings is a 71 y.o. female who presents for Medicare Annual (Subsequent) preventive examination.  Review of Systems     Cardiac Risk Factors include: advanced age (>26men, >78 women);diabetes mellitus;dyslipidemia;hypertension     Objective:    Today's Vitals   11/20/21 1134 11/20/21 1153  BP: (!) 160/80 (!) 140/60  Pulse: 69   Temp: 97.8 F (36.6 C)   TempSrc: Oral   Weight: 148 lb 3.2 oz (67.2 kg)   Height: 5\' 3"  (1.6 m)    Body mass index is 26.25 kg/m.     11/20/2021   11:45 AM 03/17/2021   10:42 AM 11/13/2020   11:15 AM 11/23/2019    8:51 AM 05/26/2018    2:51 PM 08/04/2017    2:21 PM 07/01/2016   10:51 AM  Advanced Directives  Does Patient Have a Medical Advance Directive? Yes No No No No No No  Type of Paramedic of [place];Living will        Copy of [place] in Chart? No - copy requested        Would patient like information on creating a medical advance directive?  Yes (MAU/Ambulatory/Procedural Areas - Information given) No - Patient declined Yes (MAU/Ambulatory/Procedural Areas - Information given)  No - Patient declined Yes (MAU/Ambulatory/Procedural Areas - Information given)    Current Medications (verified) Outpatient Encounter Medications as of 11/20/2021  Medication Sig   ACCU-CHEK GUIDE test strip USE AS DIRECTED TO CHECK BLOOD SUGARS 2 TIMES A DAY DX: E11.22   Accu-Chek Softclix Lancets lancets USE AS DIRECTED TO CHECK BLOOD SUGARS 2 TIMES A DAY DX: E11.22   allopurinol (ZYLOPRIM) 100 MG tablet TK 2 TS PO D   amLODipine (NORVASC) 10 MG tablet TAKE 1 TABLET(10 MG) BY MOUTH DAILY   carvedilol (COREG) 12.5 MG tablet Take 12.5 mg by mouth 2 (two) times daily with a meal. Take 2 tablets by mouth in the morning and 2 tablets in the evening.   diclofenac Sodium (VOLTAREN) 1 % GEL Apply 2 g topically 4 (four) times daily.   ferrous sulfate 325 (65 FE) MG tablet Take 1 tablet (325 mg total) by  mouth daily with breakfast.   hydrALAZINE (APRESOLINE) 25 MG tablet TAKE 1 TABLET BY MOUTH TWICE DAILY   latanoprost (XALATAN) 0.005 % ophthalmic solution place 1 drop into both eyes at bedtime   timolol (TIMOPTIC-XR) 0.5 % ophthalmic gel-forming Place 1 drop into both eyes daily.   vitamin C (ASCORBIC ACID) 500 MG tablet Take 1,000 mg by mouth daily.   Vitamin D, Ergocalciferol, (DRISDOL) 1.25 MG (50000 UNIT) CAPS capsule TAKE 1 CAPSULE BY MOUTH EVERY 7 DAYS   OZEMPIC, 0.25 OR 0.5 MG/DOSE, 2 MG/1.5ML SOPN INJECT 0.5MG  INTO THE SKIN ONCE A WEEK (Patient not taking: Reported on 11/18/2021)   Pitavastatin Calcium (LIVALO) 4 MG TABS TAKE 1 TABLET DAILY. M-F (Patient not taking: Reported on 11/20/2021)   ZYPITAMAG 4 MG TABS Take 1 tablet by mouth daily. (Patient not taking: Reported on 11/20/2021)   No facility-administered encounter medications on file as of 11/20/2021.    Allergies (verified) Shellfish allergy   History: Past Medical History:  Diagnosis Date   Arthritis    Cataracts, bilateral    Cervical disc disease    Chronic kidney disease    Dr. Hillery Hunter yearly for CKD stage II as of 11/26/11   Diabetes mellitus ([place])    Eczema    Glaucoma    Heart  murmur    History of chicken pox    Hyperlipidemia    Hypertension    Seasonal allergies    Thyroid nodule    Past Surgical History:  Procedure Laterality Date   CESAREAN SECTION  1978   COLONOSCOPY     ORIF ANKLE FRACTURE  11/05/2011   Procedure: OPEN REDUCTION INTERNAL FIXATION (ORIF) ANKLE FRACTURE;  Surgeon: Wylene Simmer, MD;  Location: [place];  Service: Orthopedics;  Laterality: Left;  OPEN REDUCTION INTERNAL FIXATION LEFT LATERAL MALLEOLUS FRACTURE WITH STRESS XRAYS WITH FLOUROSCOPY   ORIF ELBOW FRACTURE  03/05/2011   Procedure: OPEN REDUCTION INTERNAL FIXATION (ORIF) ELBOW/OLECRANON FRACTURE;  Surgeon: Linna Hoff, MD;  Location: [place];  Service: Orthopedics;  Laterality: Left;   THYROID LOBECTOMY  Right 07/20/2012   THYROIDECTOMY Right 07/20/2012   Procedure: RIGHT THYROID LOBECTOMY WITH ISTHMUSECTOMY;  Surgeon: Izora Gala, MD;  Location: [facility];  Service: ENT;  Laterality: Right;   TONSILLECTOMY     TUBAL LIGATION     Family History  Problem Relation Age of Onset   Hypertension Mother    Early death Father    Breast cancer Sister    Healthy Brother    Social History   Socioeconomic History   Marital status: Married    Spouse name: Not on file   Number of children: Not on file   Years of education: Not on file   Highest education level: Not on file  Occupational History   Occupation: retired  Tobacco Use   Smoking status: Never   Smokeless tobacco: Never  Vaping Use   Vaping Use: Never used  Substance and Sexual Activity   Alcohol use: No   Drug use: No   Sexual activity: Not Currently    Birth control/protection: Surgical    Comment: BTL  Other Topics Concern   Not on file  Social History Narrative   Not on file   Social Determinants of Health   Financial Resource Strain: [place]  (11/20/2021)   Overall Financial Resource Strain (CARDIA)    Difficulty of Paying Living Expenses: Not hard at all  Food Insecurity: No Food Insecurity (11/20/2021)   Hunger Vital Sign    Worried About Running Out of Food in the Last Year: Never true    [place] in the Last Year: Never true  Transportation Needs: No Transportation Needs (11/20/2021)   PRAPARE - Hydrologist (Medical): No    Lack of Transportation (Non-Medical): No  Physical Activity: Sufficiently Active (11/20/2021)   Exercise Vital Sign    Days of Exercise per Week: 4 days    Minutes of Exercise per Session: 60 min  Stress: No Stress Concern Present (11/20/2021)   [place]    Feeling of Stress : Not at all  Social Connections: Not on file    Tobacco Counseling Counseling given: Not  Answered   Clinical Intake:  Pre-visit preparation completed: Yes  Pain : No/denies pain     Nutritional Status: BMI 25 -29 Overweight Nutritional Risks: None Diabetes: Yes  How often do you need to have someone help you when you read instructions, pamphlets, or other written materials from your doctor or pharmacy?: 1 - Never  Diabetic? Yes Nutrition Risk Assessment:  Has the patient had any N/V/D within the last 2 months?  No  Does the patient have any non-healing wounds?  No  Has the patient had any  unintentional weight loss or weight gain?  No   Diabetes:  Is the patient diabetic?  Yes  If diabetic, was a CBG obtained today?  No  Did the patient bring in their glucometer from home?  No  How often do you monitor your CBG's? daily.   Financial Strains and Diabetes Management:  Are you having any financial strains with the device, your supplies or your medication? No .  Does the patient want to be seen by Chronic Care Management for management of their diabetes?  No  Would the patient like to be referred to a Nutritionist or for Diabetic Management?  No   Diabetic Exams:  Diabetic Eye Exam: Completed 08/12/2021 Diabetic Foot Exam: Completed 01/23/2021   Interpreter Needed?: No  Information entered by :: NAllen LPN   Activities of Daily Living    11/20/2021   11:46 AM  In your present state of health, do you have any difficulty performing the following activities:  Hearing? 0  Vision? 0  Difficulty concentrating or making decisions? 0  Walking or climbing stairs? 0  Dressing or bathing? 0  Doing errands, shopping? 0  Preparing Food and eating ? N  Using the Toilet? N  In the past six months, have you accidently leaked urine? N  Do you have problems with loss of bowel control? N  Managing your Medications? N  Managing your Finances? N  Housekeeping or managing your Housekeeping? N    Patient Care Team: Glendale Chard, MD as PCP - General (Internal  Medicine) Juanita Craver, MD as Consulting Physician (Gastroenterology) Corliss Parish, MD as Consulting Physician (Nephrology) Warden Fillers, MD as Consulting Physician (Ophthalmology) Marygrace Drought, MD as Consulting Physician (Ophthalmology) Rex Kras Claudette Stapler, RN as [place] any recent Medical Services you may have received from other than [facility] providers in the past year (date may be approximate).     Assessment:   This is a routine wellness examination for [facility].  Hearing/Vision screen Vision Screening - Comments:: Regular eye exams, Dr. Satira Sark, [facility]  Dietary issues and exercise activities discussed: Current Exercise Habits: Structured exercise class, Time (Minutes): 60, Frequency (Times/Week): 4, Weekly Exercise (Minutes/Week): 240   Goals Addressed             This Visit's Progress    Patient Stated       11/20/2021, keep blood sugar down and get HTN under control       Depression Screen    11/20/2021   11:45 AM 11/04/2021    8:51 AM 09/18/2021    3:39 PM 03/17/2021   10:42 AM 11/13/2020   11:16 AM 11/23/2019    8:53 AM 10/19/2019   11:35 AM  PHQ 2/9 Scores  PHQ - 2 Score 0 0 0 0 0 0 0    Fall Risk    11/20/2021   11:45 AM 11/04/2021    8:51 AM 09/18/2021    3:39 PM 03/17/2021   10:42 AM 11/13/2020   11:15 AM  Fall Risk   Falls in the past year? 0 0  0 0  Number falls in past yr: 0 0 0    Injury with Fall? 0 0 0    Risk for fall due to : Medication side effect No Fall Risks No Fall Risks  Medication side effect  Follow up Falls prevention discussed;Education provided;Falls evaluation completed Falls evaluation completed Falls evaluation completed  Falls evaluation completed;Education provided;Falls prevention discussed    FALL RISK PREVENTION PERTAINING  TO THE HOME:  Any stairs in or around the home? Yes  If so, are there any without handrails? No  Home free of loose throw rugs in walkways,  pet beds, electrical cords, etc? Yes  Adequate lighting in your home to reduce risk of falls? Yes   ASSISTIVE DEVICES UTILIZED TO PREVENT FALLS:  Life alert? No  Use of a cane, walker or w/c? No  Grab bars in the bathroom? No  Shower chair or bench in shower? Yes  Elevated toilet seat or a handicapped toilet? Yes   TIMED UP AND GO:  Was the test performed? Yes .  Length of time to ambulate 10 feet: 5 sec.   Gait steady and fast without use of assistive device  Cognitive Function:        11/20/2021   11:46 AM 11/13/2020   11:17 AM 11/23/2019    8:54 AM 05/26/2018    2:56 PM  6CIT Screen  What Year? 0 points 0 points 0 points 0 points  What month? 0 points 0 points 0 points 0 points  What time? 0 points 0 points 0 points 0 points  Count back from 20 0 points 0 points 0 points 0 points  Months in reverse 0 points 0 points 0 points 0 points  Repeat phrase 2 points 0 points 2 points 0 points  Total Score 2 points 0 points 2 points 0 points    Immunizations Immunization History  Administered Date(s) Administered   Fluad Quad(high Dose 65+) 11/23/2019, 11/26/2020, 11/04/2021   Influenza, High Dose Seasonal PF 11/07/2018   PFIZER(Purple Top)SARS-COV-2 Vaccination 01/27/2019, 02/17/2019, 10/31/2019, 06/22/2020   PNEUMOCOCCAL CONJUGATE-20 06/04/2021   Pneumococcal Polysaccharide-23 11/13/2015   Tdap 03/05/2011   Zoster Recombinat (Shingrix) 08/28/2019   Zoster, Live 09/22/2012    TDAP status: Up to date  Flu Vaccine status: Up to date  Pneumococcal vaccine status: Up to date  Covid-19 vaccine status: Completed vaccines  Qualifies for Shingles Vaccine? Yes   Zostavax completed Yes   Shingrix Completed?: need second dose  Screening Tests Health Maintenance  Topic Date Due   Zoster Vaccines- Shingrix (2 of 2) 10/23/2019   COVID-19 Vaccine (5 - Pfizer risk series) 08/17/2020   Medicare Annual Wellness (AWV)  11/13/2021   FOOT EXAM  01/23/2022   HEMOGLOBIN A1C   05/05/2022   OPHTHALMOLOGY EXAM  08/13/2022   Diabetic kidney evaluation - GFR measurement  09/10/2022   TETANUS/TDAP  10/25/2022   Diabetic kidney evaluation - Urine ACR  11/05/2022   MAMMOGRAM  04/18/2023   COLONOSCOPY (Pts 45-20yrs Insurance coverage will need to be confirmed)  02/01/2028   Pneumonia Vaccine 69+ Years old  Completed   INFLUENZA VACCINE  Completed   DEXA SCAN  Completed   Hepatitis C Screening  Completed   HPV VACCINES  Aged Out    Health Maintenance  Health Maintenance Due  Topic Date Due   Zoster Vaccines- Shingrix (2 of 2) 10/23/2019   COVID-19 Vaccine (5 - Pfizer risk series) 08/17/2020   Medicare Annual Wellness (AWV)  11/13/2021    Colorectal cancer screening: Type of screening: Colonoscopy. Completed 01/31/2018. Repeat every 5 years  Mammogram status: Completed 04/17/2021. Repeat every year  Bone Density status: Completed 01/22/2017.   Lung Cancer Screening: (Low Dose CT Chest recommended if Age 53-80 years, 30 pack-year currently smoking OR have quit w/in 15years.) does not qualify.   Lung Cancer Screening Referral: no  Additional Screening:  Hepatitis C Screening: does qualify; Completed 10/19/2019  Vision  Screening: Recommended annual ophthalmology exams for early detection of glaucoma and other disorders of the eye. Is the patient up to date with their annual eye exam?  Yes  Who is the provider or what is the name of the office in which the patient attends annual eye exams? Dr. Satira Sark  If pt is not established with a provider, would they like to be referred to a provider to establish care? No .   Dental Screening: Recommended annual dental exams for proper oral hygiene  Community Resource Referral / Chronic Care Management: CRR required this visit?  No   CCM required this visit?  No      Plan:     I have personally reviewed and noted the following in the patient's chart:   Medical and social history Use of alcohol, tobacco or illicit  drugs  Current medications and supplements including opioid prescriptions. Patient is not currently taking opioid prescriptions. Functional ability and status Nutritional status Physical activity Advanced directives List of other physicians Hospitalizations, surgeries, and ER visits in previous 12 months Vitals Screenings to include cognitive, depression, and falls Referrals and appointments  In addition, I have reviewed and discussed with patient certain preventive protocols, quality metrics, and best practice recommendations. A written personalized care plan for preventive services as well as general preventive health recommendations were provided to patient.     Kellie Simmering, LPN   [id number]   Nurse Notes: none

## 2021-11-24 ENCOUNTER — Other Ambulatory Visit: Payer: Self-pay

## 2021-11-24 MED ORDER — ATORVASTATIN CALCIUM 40 MG PO TABS: ORAL_TABLET | ORAL | 0 refills | Status: DC

## 2021-12-03 ENCOUNTER — Encounter: Payer: Self-pay | Admitting: Internal Medicine

## 2021-12-16 ENCOUNTER — Encounter: Payer: Self-pay | Admitting: *Deleted

## 2021-12-16 ENCOUNTER — Telehealth: Payer: Self-pay | Admitting: *Deleted

## 2021-12-16 NOTE — Progress Notes (Signed)
Error wrong practice

## 2021-12-16 NOTE — Progress Notes (Signed)
  Care Coordination Note  12/16/2021 Name: Michelle Cummings MRN: [id number] DOB: 02-13-1950  Michelle Cummings is a 72 y.o. year old female who is a primary care patient of Glendale Chard, MD and is actively engaged with the care management team. I reached out to [name] by phone today to assist with re-scheduling a follow up visit with the RN Case Manager  Follow up plan: Unsuccessful telephone outreach attempt made.   [place]  Direct Dial: [phone]

## 2021-12-16 NOTE — Progress Notes (Signed)
  Care Coordination Note  12/16/2021 Name: Michelle Cummings MRN: [id number] DOB: [birth year]  Michelle Cummings is a 71 y.o. year old female who is a primary care patient of Glendale Chard, MD and is actively engaged with the care management team. I reached out to [name] by phone today to assist with re-scheduling a follow up visit with the RN Case Manager  Follow up plan: Unsuccessful telephone outreach attempt made. A HIPAA compliant phone message was left for the patient providing contact information and requesting a return call.   [place]  Direct Dial: [phone]

## 2021-12-20 ENCOUNTER — Other Ambulatory Visit: Payer: Self-pay | Admitting: Internal Medicine

## 2022-01-02 NOTE — Progress Notes (Signed)
  Care Coordination Note  01/02/2022 Name: Michelle Cummings MRN: [id number] DOB: Apr 03, 1950  Michelle Cummings is a 71 y.o. year old female who is a primary care patient of Glendale Chard, MD and is actively engaged with the care management team. I reached out to [name] by phone today to assist with re-scheduling a follow up visit with the RN Case Manager  Follow up plan: A second unsuccessful telephone outreach attempt made. A HIPAA compliant phone message was left for the patient providing contact information and requesting a return call.   [place]  Direct Dial: [phone]

## 2022-01-02 NOTE — Progress Notes (Signed)
  Care Coordination Note  01/02/2022 Name: Michelle Cummings MRN: [id number] DOB: Jul 29, 1950  Michelle Cummings is a 71 y.o. year old female who is a primary care patient of Glendale Chard, MD and is actively engaged with the care management team. I reached out to [name] by phone today to assist with re-scheduling a follow up visit with the RN Case Manager  Follow up plan: Telephone appointment with care management team member scheduled for:01/15/22  [place]: [phone]

## 2022-01-14 DIAGNOSIS — N2581 Secondary hyperparathyroidism of renal origin: Secondary | ICD-10-CM | POA: Diagnosis not present

## 2022-01-14 DIAGNOSIS — E875 Hyperkalemia: Secondary | ICD-10-CM | POA: Diagnosis not present

## 2022-01-14 DIAGNOSIS — E1122 Type 2 diabetes mellitus with diabetic chronic kidney disease: Secondary | ICD-10-CM | POA: Diagnosis not present

## 2022-01-14 DIAGNOSIS — N185 Chronic kidney disease, stage 5: Secondary | ICD-10-CM | POA: Diagnosis not present

## 2022-01-14 DIAGNOSIS — M109 Gout, unspecified: Secondary | ICD-10-CM | POA: Diagnosis not present

## 2022-01-14 DIAGNOSIS — I120 Hypertensive chronic kidney disease with stage 5 chronic kidney disease or end stage renal disease: Secondary | ICD-10-CM | POA: Diagnosis not present

## 2022-01-14 LAB — BASIC METABOLIC PANEL
BUN: 129 — AB (ref 4–21)
CO2: 18 (ref 13–22)
Chloride: 99 (ref 99–108)
Creatinine: 8.4 — AB (ref 0.5–1.1)
Glucose: 151
Potassium: 4.5 mEq/L (ref 3.5–5.1)
Sodium: 132 — AB (ref 137–147)

## 2022-01-14 LAB — COMPREHENSIVE METABOLIC PANEL
Calcium: 8.9 (ref 8.7–10.7)
eGFR: 5

## 2022-01-14 LAB — PROTEIN / CREATININE RATIO, URINE: Albumin, U: 4.1

## 2022-01-14 LAB — IRON,TIBC AND FERRITIN PANEL
Ferritin: 434
Iron: 70

## 2022-01-14 LAB — HEMOGLOBIN A1C: Hemoglobin A1C: 8.4

## 2022-01-15 ENCOUNTER — Ambulatory Visit: Payer: Self-pay

## 2022-01-15 LAB — IRON,TIBC AND FERRITIN PANEL
%SAT: 31
Ferritin: 434
Iron: 70
TIBC: 227
UIBC: 157

## 2022-01-15 LAB — BASIC METABOLIC PANEL
BUN: 129 — AB (ref 4–21)
Creatinine: 8.4 — AB (ref 0.5–1.1)
Glucose: 151
Sodium: 132 — AB (ref 137–147)

## 2022-01-15 LAB — COMPREHENSIVE METABOLIC PANEL: eGFR: 5

## 2022-01-15 NOTE — Patient Outreach (Signed)
  Care Coordination   01/15/2022 Name: Michelle Cummings MRN: [id number] DOB: Feb 17, 1950   Care Coordination Outreach Attempts:  An unsuccessful telephone outreach was attempted for a scheduled appointment today.  Follow Up Plan:  Additional outreach attempts will be made to offer the patient care coordination information and services.   Encounter Outcome:  No Answer   Care Coordination Interventions:  No, not indicated    Barb Merino, RN, BSN, CCM Care Management Coordinator [facility] Direct Phone: [phone]

## 2022-01-16 ENCOUNTER — Telehealth: Payer: Self-pay | Admitting: *Deleted

## 2022-01-16 NOTE — Progress Notes (Unsigned)
  Care Coordination Note  01/16/2022 Name: [name] MRN: [id number] DOB: [birth year]  Michelle Cummings is a 72 y.o. year old female who is a primary care patient of Glendale Chard, MD and is actively engaged with the care management team. I reached out to [name] by phone today to assist with re-scheduling a follow up visit with the RN Case Manager  Follow up plan: Unsuccessful telephone outreach attempt made. A HIPAA compliant phone message was left for the patient providing contact information and requesting a return call.   [place]  Direct Dial: [phone]

## 2022-01-20 NOTE — Progress Notes (Signed)
  Care Coordination Note  01/20/2022 Name: [name] MRN: [id number] DOB: May 19, 1950  Michelle Cummings is a 72 y.o. year old female who is a primary care patient of Glendale Chard, MD and is actively engaged with the care management team. I reached out to [name] by phone today to assist with re-scheduling a follow up visit with the RN Case Manager  Follow up plan: A third unsuccessful telephone outreach attempt made. A HIPAA compliant phone message was left for the patient providing contact information and requesting a return call.  We have been unable to make contact with the patient for follow up. The care management team is available to follow up with the patient after provider conversation with the patient regarding recommendation for care management engagement and subsequent re-referral to the care management team.   [place]  Direct Dial: [phone]

## 2022-01-21 ENCOUNTER — Other Ambulatory Visit: Payer: Self-pay

## 2022-01-26 ENCOUNTER — Encounter (HOSPITAL_COMMUNITY)
Admission: RE | Admit: 2022-01-26 | Discharge: 2022-01-26 | Disposition: A | Discharge status: Home / Self Care | Payer: Medicare PPO | Source: Ambulatory Visit | Attending: Nephrology | Admitting: Nephrology

## 2022-01-26 VITALS — BP 183/66 | HR 81 | Temp 97.0°F | Resp 17

## 2022-01-26 DIAGNOSIS — D508 Other iron deficiency anemias: Secondary | ICD-10-CM | POA: Diagnosis not present

## 2022-01-26 DIAGNOSIS — D638 Anemia in other chronic diseases classified elsewhere: Secondary | ICD-10-CM | POA: Diagnosis not present

## 2022-01-26 LAB — POCT HEMOGLOBIN-HEMACUE: Hemoglobin: 7.6 g/dL — ABNORMAL LOW (ref 12.0–15.0)

## 2022-01-26 MED ORDER — EPOETIN ALFA-EPBX 10000 UNIT/ML IJ SOLN
INTRAMUSCULAR | Status: AC
  Filled 2022-01-26: qty 2

## 2022-01-26 MED ORDER — EPOETIN ALFA-EPBX 10000 UNIT/ML IJ SOLN
20000.0000 [IU] | INTRAMUSCULAR | Status: DC
  Administered 2022-01-26: 20000 [IU] via SUBCUTANEOUS

## 2022-01-26 NOTE — Progress Notes (Signed)
Michelle Cummings is here today for her retacrit injection.  Her hemocue is 7.6.  Dr Shelva Majestic office notified

## 2022-01-27 ENCOUNTER — Encounter: Payer: Self-pay | Admitting: Nurse Practitioner

## 2022-01-27 ENCOUNTER — Ambulatory Visit: Payer: Medicare PPO | Admitting: Nurse Practitioner

## 2022-01-27 VITALS — BP 132/70 | HR 64 | Temp 97.6°F

## 2022-01-27 DIAGNOSIS — N184 Chronic kidney disease, stage 4 (severe): Secondary | ICD-10-CM | POA: Diagnosis not present

## 2022-01-27 DIAGNOSIS — R051 Acute cough: Secondary | ICD-10-CM

## 2022-01-27 DIAGNOSIS — R0981 Nasal congestion: Secondary | ICD-10-CM | POA: Diagnosis not present

## 2022-01-27 DIAGNOSIS — U071 COVID-19: Secondary | ICD-10-CM

## 2022-01-27 DIAGNOSIS — N179 Acute kidney failure, unspecified: Secondary | ICD-10-CM | POA: Insufficient documentation

## 2022-01-27 LAB — POC INFLUENZA A&B (BINAX/QUICKVUE)
Influenza A, POC: NEGATIVE
Influenza B, POC: NEGATIVE

## 2022-01-27 LAB — POC COVID19 BINAXNOW: SARS Coronavirus 2 Ag: POSITIVE — AB

## 2022-01-27 MED ORDER — FLUTICASONE PROPIONATE 50 MCG/ACT NA SUSP: 2.0000 | Freq: Every day | NASAL | 2 refills | Status: DC

## 2022-01-27 MED ORDER — LAGEVRIO 200 MG PO CAPS: 4.0000 | ORAL_CAPSULE | Freq: Two times a day (BID) | ORAL | 0 refills | Status: AC

## 2022-01-27 MED ORDER — BENZONATATE 100 MG PO CAPS: 100.0000 mg | ORAL_CAPSULE | Freq: Four times a day (QID) | ORAL | 1 refills | Status: DC | PRN

## 2022-01-27 NOTE — Patient Instructions (Signed)

## 2022-01-27 NOTE — Progress Notes (Signed)
I,Tianna Badgett,acting as a Education administrator for [employer], FNP.,have documented all relevant documentation on the behalf of Minette Brine, FNP,as directed by  Minette Brine, FNP while in the presence of Minette Brine, [place].  Subjective:     Patient ID: Michelle Cummings , female    DOB: 11/04/1950 , 72 y.o.   MRN: [id number]   Chief Complaint  Patient presents with   Sinus Problem    HPI  Patient presents today for sinus congestion. Sunday she began coughing and congestion. No fever. She thought it was sinus related. She has taken mucinex dm. She had both head congestion and chest congestion. She has not used any nasal sprays      Past Medical History:  Diagnosis Date   Arthritis    Cataracts, bilateral    Cervical disc disease    Chronic kidney disease    Dr. Hillery Hunter yearly for CKD stage II as of 11/26/11   Diabetes mellitus (HCC)    Eczema    Glaucoma    Heart murmur    History of chicken pox    Hyperlipidemia    Hypertension    Seasonal allergies    Thyroid nodule      Family History  Problem Relation Age of Onset   Hypertension Mother    Early death Father    Breast cancer Sister    Healthy Brother      Current Outpatient Medications:    allopurinol (ZYLOPRIM) 100 MG tablet, TK 2 TS PO D, Disp: , Rfl:    amLODipine (NORVASC) 10 MG tablet, TAKE 1 TABLET(10 MG) BY MOUTH DAILY, Disp: 90 tablet, Rfl: 1   atorvastatin (LIPITOR) 40 MG tablet, Take one tablet by mouth daily on Monday through Friday., Disp: 90 tablet, Rfl: 0   benzonatate (TESSALON PERLES) 100 MG capsule, Take 1 capsule (100 mg total) by mouth every 6 (six) hours as needed., Disp: 30 capsule, Rfl: 1   carvedilol (COREG) 12.5 MG tablet, Take 12.5 mg by mouth 2 (two) times daily with a meal. Take 2 tablets by mouth in the morning and 2 tablets in the evening., Disp: , Rfl:    diclofenac Sodium (VOLTAREN) 1 % GEL, Apply 2 g topically 4 (four) times daily., Disp: 100 g, Rfl: 2   ferrous sulfate 325 (65 FE) MG  tablet, Take 1 tablet (325 mg total) by mouth daily with breakfast., Disp: 30 tablet, Rfl: 3   fluticasone (FLONASE) 50 MCG/ACT nasal spray, Place 2 sprays into both nostrils daily., Disp: 16 g, Rfl: 2   hydrALAZINE (APRESOLINE) 25 MG tablet, TAKE 1 TABLET BY MOUTH TWICE DAILY, Disp: 90 tablet, Rfl: 1   latanoprost (XALATAN) 0.005 % ophthalmic solution, place 1 drop into both eyes at bedtime, Disp: , Rfl:    molnupiravir EUA (LAGEVRIO) 200 MG CAPS capsule, Take 4 capsules (800 mg total) by mouth 2 (two) times daily for 5 days., Disp: 40 capsule, Rfl: 0   ACCU-CHEK GUIDE test strip, USE AS DIRECTED TO CHECK BLOOD SUGARS 2 TIMES A DAY DX: E11.22, Disp: 100 each, Rfl: 2   Accu-Chek Softclix Lancets lancets, USE AS DIRECTED TO CHECK BLOOD SUGARS 2 TIMES A DAY DX: E11.22, Disp: 100 each, Rfl: 2   OZEMPIC, 0.25 OR 0.5 MG/DOSE, 2 MG/1.5ML SOPN, INJECT 0.5MG  INTO THE SKIN ONCE A WEEK (Patient not taking: Reported on 11/18/2021), Disp: 4.5 mL, Rfl: 1   Pitavastatin Calcium (LIVALO) 4 MG TABS, TAKE 1 TABLET DAILY. M-F (Patient not taking: Reported on 11/20/2021), Disp: 70  tablet, Rfl: 1   timolol (TIMOPTIC-XR) 0.5 % ophthalmic gel-forming, Place 1 drop into both eyes daily., Disp: , Rfl:    vitamin C (ASCORBIC ACID) 500 MG tablet, Take 1,000 mg by mouth daily., Disp: , Rfl:    Vitamin D, Ergocalciferol, (DRISDOL) 1.25 MG (50000 UNIT) CAPS capsule, TAKE 1 CAPSULE BY MOUTH EVERY 7 DAYS, Disp: 12 capsule, Rfl: 0   ZYPITAMAG 4 MG TABS, Take 1 tablet by mouth daily. (Patient not taking: Reported on 11/20/2021), Disp: 30 tablet, Rfl: 2   Allergies  Allergen Reactions   Shellfish Allergy Itching     Review of Systems  Constitutional: Negative.   Respiratory: Negative.    Cardiovascular: Negative.   Gastrointestinal: Negative.   Neurological: Negative.      Today's Vitals   01/27/22 1045  BP: 132/70  Pulse: 64  Temp: 97.6 F (36.4 C)  TempSrc: Oral   There is no height or weight on file to calculate  BMI.   Objective:  Physical Exam Vitals reviewed.  Constitutional:      General: She is not in acute distress.    Appearance: Normal appearance. She is obese.  HENT:     Head: Normocephalic.  Cardiovascular:     Rate and Rhythm: Normal rate and regular rhythm.     Pulses: Normal pulses.     Heart sounds: Normal heart sounds. No murmur heard. Pulmonary:     Effort: Pulmonary effort is normal. No respiratory distress.     Breath sounds: Normal breath sounds.  Musculoskeletal:     Cervical back: Normal range of motion and neck supple.  Skin:    General: Skin is warm and dry.     Capillary Refill: Capillary refill takes less than 2 seconds.  Neurological:     General: No focal deficit present.     Mental Status: She is alert and oriented to person, place, and time.     Cranial Nerves: No cranial nerve deficit.     Motor: No weakness.         Assessment And Plan:     1. COVID-19 Comments: Will treat with [place] due to kidney functions, eGFR is 10 - in November. If not covered will contact Nephrology - molnupiravir EUA (LAGEVRIO) 200 MG CAPS capsule; Take 4 capsules (800 mg total) by mouth 2 (two) times daily for 5 days.  Dispense: 40 capsule; Refill: 0 - benzonatate (TESSALON PERLES) 100 MG capsule; Take 1 capsule (100 mg total) by mouth every 6 (six) hours as needed.  Dispense: 30 capsule; Refill: 1 - fluticasone (FLONASE) 50 MCG/ACT nasal spray; Place 2 sprays into both nostrils daily.  Dispense: 16 g; Refill: 2  2. Nasal congestion Comments: She is to take flonase nasal spray as needed and encouraged to stay well hydrated with water. - POC COVID-19 - POC Influenza A&B (Binax test) - fluticasone (FLONASE) 50 MCG/ACT nasal spray; Place 2 sprays into both nostrils daily.  Dispense: 16 g; Refill: 2  3. Acute cough Comments: Will treat wtih tessalon perles and encouraged to stay well hydrated with water .  4. Chronic renal disease, stage IV [facility]) Comments: Last visit with  Nephrology in November, plans to refer to transplant team.     Patient was given opportunity to ask questions. Patient verbalized understanding of the plan and was able to repeat key elements of the plan. All questions were answered to their satisfaction.  Minette Brine, FNP   I, Minette Brine, FNP, have reviewed all documentation for this  visit. The documentation on 01/27/22 for the exam, diagnosis, procedures, and orders are all accurate and complete.   IF YOU HAVE BEEN REFERRED TO A SPECIALIST, IT MAY TAKE 1-2 WEEKS TO SCHEDULE/PROCESS THE REFERRAL. IF YOU HAVE NOT HEARD FROM US/SPECIALIST IN TWO WEEKS, PLEASE GIVE [place] A CALL AT [phone] X [phone].   THE PATIENT IS ENCOURAGED TO PRACTICE SOCIAL DISTANCING DUE TO THE COVID-19 PANDEMIC.

## 2022-01-28 ENCOUNTER — Ambulatory Visit: Payer: Medicare PPO | Admitting: Podiatry

## 2022-02-02 ENCOUNTER — Encounter (HOSPITAL_COMMUNITY): Payer: Medicare PPO

## 2022-02-03 ENCOUNTER — Encounter (HOSPITAL_COMMUNITY): Payer: Medicare PPO

## 2022-02-09 ENCOUNTER — Encounter (HOSPITAL_COMMUNITY)
Admission: RE | Admit: 2022-02-09 | Discharge: 2022-02-09 | Disposition: A | Discharge status: Home / Self Care | Payer: Medicare PPO | Source: Ambulatory Visit | Attending: Nephrology | Admitting: Nephrology

## 2022-02-09 VITALS — BP 180/53 | HR 78 | Temp 97.6°F | Resp 17

## 2022-02-09 DIAGNOSIS — D508 Other iron deficiency anemias: Secondary | ICD-10-CM | POA: Insufficient documentation

## 2022-02-09 DIAGNOSIS — D638 Anemia in other chronic diseases classified elsewhere: Secondary | ICD-10-CM | POA: Diagnosis not present

## 2022-02-09 LAB — POCT HEMOGLOBIN-HEMACUE: Hemoglobin: 7.8 g/dL — ABNORMAL LOW (ref 12.0–15.0)

## 2022-02-09 MED ORDER — EPOETIN ALFA-EPBX 10000 UNIT/ML IJ SOLN
20000.0000 [IU] | INTRAMUSCULAR | Status: DC
  Administered 2022-02-09: 20000 [IU] via SUBCUTANEOUS

## 2022-02-09 MED ORDER — EPOETIN ALFA-EPBX 10000 UNIT/ML IJ SOLN
INTRAMUSCULAR | Status: AC
  Filled 2022-02-09: qty 2

## 2022-02-09 NOTE — Progress Notes (Signed)
JoAnne at [facility] notified of pt's hemoCue of 7.8. Instructed to continue with plan in progress

## 2022-02-10 ENCOUNTER — Ambulatory Visit: Payer: Medicare PPO | Admitting: Internal Medicine

## 2022-02-10 ENCOUNTER — Encounter: Payer: Self-pay | Admitting: Internal Medicine

## 2022-02-10 VITALS — BP 150/72 | HR 76 | Temp 97.7°F | Ht 63.0 in | Wt 146.0 lb

## 2022-02-10 DIAGNOSIS — N185 Chronic kidney disease, stage 5: Secondary | ICD-10-CM

## 2022-02-10 DIAGNOSIS — I1311 Hypertensive heart and chronic kidney disease without heart failure, with stage 5 chronic kidney disease, or end stage renal disease: Secondary | ICD-10-CM | POA: Diagnosis not present

## 2022-02-10 DIAGNOSIS — E7800 Pure hypercholesterolemia, unspecified: Secondary | ICD-10-CM | POA: Diagnosis not present

## 2022-02-10 DIAGNOSIS — I700 Atherosclerosis of aorta: Secondary | ICD-10-CM

## 2022-02-10 DIAGNOSIS — E1122 Type 2 diabetes mellitus with diabetic chronic kidney disease: Secondary | ICD-10-CM

## 2022-02-10 DIAGNOSIS — Z6825 Body mass index (BMI) 25.0-25.9, adult: Secondary | ICD-10-CM

## 2022-02-10 MED ORDER — ATORVASTATIN CALCIUM 40 MG PO TABS: ORAL_TABLET | ORAL | 2 refills | Status: DC

## 2022-02-10 NOTE — Patient Instructions (Addendum)
[place] Coordination nurse [phone]  Type 2 Diabetes Mellitus, Diagnosis, Adult Type 2 diabetes (type 2 diabetes mellitus) is a long-term (chronic) disease. It may happen when there is one or both of these problems: The pancreas does not make enough insulin. The body does not react in a normal way to insulin that it makes. Insulin lets sugars go into cells in your body. If you have type 2 diabetes, sugars cannot get into your cells. Sugars build up in the blood. This causes high blood sugar. What are the causes? The exact cause of this condition is not known. What increases the risk? Having type 2 diabetes in your family. Being overweight or very overweight. Not being active. Your body not reacting in a normal way to the insulin it makes. Having higher than normal blood sugar over time. Having a type of diabetes when you were pregnant. Having a condition that causes small fluid-filled sacs on your ovaries. What are the signs or symptoms? At first, you may have no symptoms. You will get symptoms slowly. They may include: More thirst than normal. More hunger than normal. Needing to pee more than normal. Losing weight without trying. Feeling tired. Feeling weak. Seeing things blurry. Dark patches on your skin. How is this treated? This condition may be treated by a diabetes expert. You may need to: Follow an eating plan made by a food expert (dietitian). Get regular exercise. Find ways to deal with stress. Check blood sugar as often as told. Take medicines. Your doctor will set treatment goals for you. Your blood sugar should be at these levels: Before meals: 80-130 mg/dL (4.4-7.2 mmol/L). After meals: below 180 mg/dL (10 mmol/L). Over the last 2-3 months: less than 7%. Follow these instructions at home: Medicines Take your diabetes medicines or insulin every day. Take medicines as told to help you prevent other problems caused by this condition. You may  need: Aspirin. Medicine to lower cholesterol. Medicine to control blood pressure. Questions to ask your doctor Should I meet with a diabetes educator? What medicines do I need, and when should I take them? What will I need to treat my condition at home? When should I check my blood sugar? Where can I find a support group? Who can I call if I have questions? When is my next doctor visit? General instructions Take over-the-counter and prescription medicines only as told by your doctor. Keep all follow-up visits. Where to find more information For help and guidance and more information about diabetes, please go to: American Diabetes Association (ADA): www.diabetes.org American Association of Diabetes Care and Education Specialists (ADCES): www.diabeteseducator.org International Diabetes Federation (IDF): MemberVerification.ca Contact a doctor if: Your blood sugar is at or above 240 mg/dL (13.3 mmol/L) for 2 days in a row. You have been sick for 2 days or more, and you are not getting better. You have had a fever for 2 days or more, and you are not getting better. You have any of these problems for more than 6 hours: You cannot eat or drink. You feel like you may vomit. You vomit. You have watery poop (diarrhea). Get help right away if: Your blood sugar is lower than 54 mg/dL (3 mmol/L). You feel mixed up (confused). You have trouble thinking clearly. You have trouble breathing. You have medium or large ketone levels in your pee. These symptoms may be an emergency. Get help right away. Call your local emergency services (911 in the [place] Do not wait to see if the symptoms  will go away. Do not drive yourself to the hospital. Summary Type 2 diabetes is a long-term disease. Your pancreas may not make enough insulin, or your body may not react in a normal way to insulin that it makes. This condition is treated with an eating plan, lifestyle changes, and medicines. Your doctor will set  treatment goals for you. These will help you keep your blood sugar in a healthy range. Keep all follow-up visits. This information is not intended to replace advice given to you by your health care provider. Make sure you discuss any questions you have with your health care provider. Document Revised: 03/18/2020 Document Reviewed: 03/18/2020 Elsevier Patient Education  [place].

## 2022-02-10 NOTE — Progress Notes (Signed)
I,Victoria T [place],acting as a scribe for [name], MD.,have documented all relevant documentation on the behalf of [name], MD,as directed by  [name], MD while in the presence of [name], MD.    Subjective:     Patient ID: Michelle Cummings , female    DOB: 05-06-1950 , 72 y.o.   MRN: [id number]   Chief Complaint  Patient presents with   Diabetes   Hypertension    HPI  The patient is here today for a diabetes and blood pressure f/u.  She reports compliance with meds. She denies having any headaches, chest pain and shortness of breath. She states her nephrologist states she will need to start dialysis soon.   She has since been to [facility] for Transplant class.   Diabetes She presents for her follow-up diabetic visit. She has type 2 diabetes mellitus. Her disease course has been stable. There are no hypoglycemic associated symptoms. Pertinent negatives for diabetes include no blurred vision, no chest pain, no polydipsia, no polyphagia and no polyuria. There are no hypoglycemic complications. Diabetic complications include nephropathy. Risk factors for coronary artery disease include diabetes mellitus, dyslipidemia, hypertension and post-menopausal. She is following a diabetic diet. She participates in exercise three times a week. Her breakfast blood glucose is taken between 9-10 am. Her breakfast blood glucose range is generally 110-130 mg/dl. An ACE inhibitor/angiotensin II receptor blocker is being taken.  Hypertension This is a chronic problem. The current episode started more than 1 year ago. The problem has been gradually improving since onset. The problem is controlled. Pertinent negatives include no blurred vision or chest pain. Past treatments include angiotensin blockers, diuretics and ACE inhibitors. The current treatment provides moderate improvement. Hypertensive end-organ damage includes kidney disease.     Past Medical History:  Diagnosis Date    Arthritis    Cataracts, bilateral    Cervical disc disease    Chronic kidney disease    Dr. Hillery Hunter yearly for CKD stage II as of 11/26/11   Diabetes mellitus ([place])    Eczema    Glaucoma    Heart murmur    History of chicken pox    Hyperlipidemia    Hypertension    Seasonal allergies    Thyroid nodule      Family History  Problem Relation Age of Onset   Hypertension Mother    Early death Father    Breast cancer Sister    Healthy Brother      Current Outpatient Medications:    ACCU-CHEK GUIDE test strip, USE AS DIRECTED TO CHECK BLOOD SUGARS 2 TIMES A DAY DX: E11.22, Disp: 100 each, Rfl: 2   Accu-Chek Softclix Lancets lancets, USE AS DIRECTED TO CHECK BLOOD SUGARS 2 TIMES A DAY DX: E11.22, Disp: 100 each, Rfl: 2   allopurinol (ZYLOPRIM) 100 MG tablet, TK 2 TS PO D, Disp: , Rfl:    amLODipine (NORVASC) 5 MG tablet, Take 1 tablet by mouth daily., Disp: , Rfl:    benzonatate (TESSALON PERLES) 100 MG capsule, Take 1 capsule (100 mg total) by mouth every 6 (six) hours as needed., Disp: 30 capsule, Rfl: 1   carvedilol (COREG) 25 MG tablet, Take 25 mg by mouth 2 (two) times daily., Disp: , Rfl:    diclofenac Sodium (VOLTAREN) 1 % GEL, Apply 2 g topically 4 (four) times daily., Disp: 100 g, Rfl: 2   ferrous sulfate 325 (65 FE) MG tablet, Take 1 tablet (325 mg total) by mouth  daily with breakfast., Disp: 30 tablet, Rfl: 3   fluticasone (FLONASE) 50 MCG/ACT nasal spray, Place 2 sprays into both nostrils daily., Disp: 16 g, Rfl: 2   hydrALAZINE (APRESOLINE) 25 MG tablet, Take by mouth., Disp: , Rfl:    latanoprost (XALATAN) 0.005 % ophthalmic solution, place 1 drop into both eyes at bedtime, Disp: , Rfl:    timolol (TIMOPTIC-XR) 0.5 % ophthalmic gel-forming, Place 1 drop into both eyes daily., Disp: , Rfl:    vitamin C (ASCORBIC ACID) 500 MG tablet, Take 1,000 mg by mouth daily., Disp: , Rfl:    Vitamin D, Ergocalciferol, (DRISDOL) 1.25 MG (50000 UNIT) CAPS capsule, TAKE 1 CAPSULE BY  MOUTH EVERY 7 DAYS, Disp: 12 capsule, Rfl: 0   allopurinol (ZYLOPRIM) 20 mg/mL SUSP, 100 mg., Disp: , Rfl:    atorvastatin (LIPITOR) 40 MG tablet, Take one tablet by mouth daily on Monday through Friday., Disp: 90 tablet, Rfl: 2   Allergies  Allergen Reactions   Shellfish Allergy Itching     Review of Systems  Constitutional: Negative.   Eyes:  Negative for blurred vision.  Respiratory: Negative.    Cardiovascular: Negative.  Negative for chest pain.  Endocrine: Negative for polydipsia, polyphagia and polyuria.  Neurological: Negative.   Psychiatric/Behavioral: Negative.       Today's Vitals   02/10/22 0853 02/10/22 0907  BP: (!) 152/80 (!) 150/72  Pulse: 76   Temp: 97.7 F (36.5 C)   TempSrc: Oral   Weight: 146 lb (66.2 kg)   Height: '5\' 3"'$  (1.6 m)    Body mass index is 25.86 kg/m.   Objective:  Physical Exam Vitals and nursing note reviewed.  Constitutional:      Appearance: Normal appearance.  HENT:     Head: Normocephalic and atraumatic.     Nose:     Comments: Masked     Mouth/Throat:     Comments: Masked  Eyes:     Extraocular Movements: Extraocular movements intact.  Cardiovascular:     Rate and Rhythm: Normal rate and regular rhythm.     Heart sounds: Normal heart sounds.  Pulmonary:     Effort: Pulmonary effort is normal.     Breath sounds: Normal breath sounds.  Musculoskeletal:     Cervical back: Normal range of motion.  Skin:    General: Skin is warm.  Neurological:     General: No focal deficit present.     Mental Status: She is alert.  Psychiatric:        Mood and Affect: Mood normal.        Behavior: Behavior normal.      Assessment And Plan:     1. Diabetes mellitus with stage 5 chronic kidney disease (HCC) Comments: Chronic, I will chck an a1c today.  She is no longer on weekly semaglutide.   Encouraged to follow dietary recommendations. - Hemoglobin A1c  2. Hypertensive heart and kidney disease without heart failure and with  chronic kidney disease stage V or end stage renal disease ([place]) Comments: Chronic, uncontrolled. she will c/w amlodipine '5mg'$  daily, hydralazine '25mg'$  po bid and carvedilol '25mg'$   bid.  3. Atherosclerosis of aorta (HCC) Comments: Chronic, LDL goal < 70. She will c/w atorvastatin '40mg'$  daily.  4. Pure hypercholesterolemia Comments: Chronic, LDL goal is less than 70.  She will c/w atorvastatin '40mg'$  daily. She is encouraged to follow heart healthy lifestyle.  5. Body mass index (BMI) of 25.0-25.9 in adult Comments: She is encouraged to continue with her  regular exercise regimen. She is advised to aim for at least 150 minutes of exercise/week.   Patient was given opportunity to ask questions. Patient verbalized understanding of the plan and was able to repeat key elements of the plan. All questions were answered to their satisfaction.   I, [name], MD, have reviewed all documentation for this visit. The documentation on 02/10/22 for the exam, diagnosis, procedures, and orders are all accurate and complete.   IF YOU HAVE BEEN REFERRED TO A SPECIALIST, IT MAY TAKE 1-2 WEEKS TO SCHEDULE/PROCESS THE REFERRAL. IF YOU HAVE NOT HEARD FROM US/SPECIALIST IN TWO WEEKS, PLEASE GIVE [place] A CALL AT [phone].   THE PATIENT IS ENCOURAGED TO PRACTICE SOCIAL DISTANCING DUE TO THE COVID-19 PANDEMIC.

## 2022-02-11 ENCOUNTER — Ambulatory Visit: Payer: Medicare PPO

## 2022-02-11 DIAGNOSIS — H401131 Primary open-angle glaucoma, bilateral, mild stage: Secondary | ICD-10-CM | POA: Diagnosis not present

## 2022-02-11 LAB — HEMOGLOBIN A1C
Est. average glucose Bld gHb Est-mCnc: 126 mg/dL
Hgb A1c MFr Bld: 6 % — ABNORMAL HIGH (ref 4.8–5.6)

## 2022-02-11 NOTE — Patient Instructions (Signed)
Visit Information  Thank you for taking time to visit with me today. Please don't hesitate to contact me if I can be of assistance to you.   Following are the goals we discussed today:   Goals Addressed             This Visit's Progress    Caregiver Stress       Care Coordination Interventions: Evaluation of current treatment plan related to caregiver stress and anxiety and patient's adherence to plan as established by provider Determined patient is dealing with increased caregiver stress and anxiety due to her spouse is inpatient and patient is needing education and information about long term care placement  Discussed her inability to continue to care for her spouse due to his decline in health and patient needs 24/7 care Active listening / Reflection utilized  Emotional Support Provided Made referral to [place] patient Tillie Rung will contact her tomorrow morning at 9 am to offer resources and support, patient verbalizes understanding and will be available to take her call       To understand my kidney disease       Care Coordination Interventions: Assessed the Patient understanding of chronic kidney disease    Evaluation of current treatment plan related to chronic kidney disease self management and patient's adherence to plan as established by provider     Determined patient completed a transplant consultation and has started the process for a renal transplant work up Educated patient regarding the modes of dialysis including hemodialysis and Peritoneal Dialysis Encouraged patient to reschedule her missed appointment at the kidney center in order to learn more about the modes of dialysis and what to expect   Reviewed prescribed diet renal, follow MD recommendations for fluid restriction Screening for signs and symptoms of depression related to chronic disease state      Discussed the impact of chronic kidney disease on daily life and mental health and  acknowledged and normalized feelings of disempowerment, fear, and frustration    Assessed social determinant of health barriers    Provided education on kidney disease progression    Engage patient in early, proactive and ongoing discussion about goals of care and what matters most to them              Our next appointment is by telephone on 03/11/22 at 11:00 AM  Please call the care guide team at [phone] if you need to cancel or reschedule your appointment.   If you are experiencing a Mental Health or [place] or need someone to talk to, please call 1-800-273-TALK (toll free, 24 hour hotline) go to [facility] [address], [place] [phone])  Patient verbalizes understanding of instructions and care plan provided today and agrees to view in [place]. Active MyChart status and patient understanding of how to access instructions and care plan via MyChart confirmed with patient.     Barb Merino, RN, BSN, CCM Care Management Coordinator [facility] Care Management  Direct Phone: [phone]

## 2022-02-11 NOTE — Patient Outreach (Signed)
  Care Coordination   Follow Up Visit Note   02/11/2022 Name: Michelle Cummings MRN: [id number] DOB: [birth year]  Michelle Cummings is a 71 y.o. year old female who sees Glendale Chard, MD for primary care. I spoke with  Michelle Cummings by phone today.  What matters to the patients health and wellness today?  Michelle would like to learn more about her kidney disease, the modes of dialysis and what to expect.     Goals Addressed             This Visit's Progress    Caregiver Stress       Care Coordination Interventions: Evaluation of current treatment plan related to caregiver stress and anxiety and Michelle's adherence to plan as established by provider Determined Michelle is dealing with increased caregiver stress and anxiety due to her spouse is inpatient and Michelle is needing education and information about long term care placement  Discussed her inability to continue to care for her spouse due to his decline in health and Michelle needs 24/7 care Active listening / Reflection utilized  Emotional Support Provided Made referral to [place] Michelle Cummings will contact her tomorrow morning at 9 am to offer resources and support, Michelle verbalizes understanding and will be available to take her call       To understand my kidney disease       Care Coordination Interventions: Assessed the Michelle understanding of chronic kidney disease    Evaluation of current treatment plan related to chronic kidney disease self management and Michelle's adherence to plan as established by provider     Determined Michelle completed a transplant consultation and has started the process for a renal transplant work up Educated Michelle regarding the modes of dialysis including hemodialysis and Peritoneal Dialysis Encouraged Michelle to reschedule her missed appointment at the kidney center in order to learn more about the modes of dialysis and what to expect   Reviewed prescribed diet renal, follow  MD recommendations for fluid restriction Screening for signs and symptoms of depression related to chronic disease state      Discussed the impact of chronic kidney disease on daily life and mental health and acknowledged and normalized feelings of disempowerment, fear, and frustration    Assessed social determinant of health barriers    Provided education on kidney disease progression    Engage Michelle in early, proactive and ongoing discussion about goals of care and what matters most to them              SDOH assessments and interventions completed:  No     Care Coordination Interventions:  Yes, provided   Follow up plan: Referral made to [place] to provide resources and support for caregiver stress  Follow up call scheduled for 03/11/22 @11 :00 AM     Encounter Outcome:  Pt. Visit Completed

## 2022-02-16 ENCOUNTER — Encounter: Payer: Self-pay | Admitting: Internal Medicine

## 2022-02-23 ENCOUNTER — Encounter (HOSPITAL_COMMUNITY)
Admission: RE | Admit: 2022-02-23 | Discharge: 2022-02-23 | Disposition: A | Discharge status: Home / Self Care | Payer: Medicare PPO | Source: Ambulatory Visit | Attending: Nephrology | Admitting: Nephrology

## 2022-02-23 DIAGNOSIS — D508 Other iron deficiency anemias: Secondary | ICD-10-CM | POA: Diagnosis not present

## 2022-02-23 DIAGNOSIS — D638 Anemia in other chronic diseases classified elsewhere: Secondary | ICD-10-CM

## 2022-02-23 LAB — RENAL FUNCTION PANEL
Albumin: 3.3 g/dL — ABNORMAL LOW (ref 3.5–5.0)
Anion gap: 17 — ABNORMAL HIGH (ref 5–15)
BUN: 117 mg/dL — ABNORMAL HIGH (ref 8–23)
CO2: 13 mmol/L — ABNORMAL LOW (ref 22–32)
Calcium: 8.3 mg/dL — ABNORMAL LOW (ref 8.9–10.3)
Chloride: 109 mmol/L (ref 98–111)
Creatinine, Ser: 7.32 mg/dL — ABNORMAL HIGH (ref 0.44–1.00)
GFR, Estimated: 5 mL/min — ABNORMAL LOW (ref >60)
Glucose, Bld: 124 mg/dL — ABNORMAL HIGH (ref 70–99)
Phosphorus: 7.8 mg/dL — ABNORMAL HIGH (ref 2.5–4.6)
Potassium: 4.5 mmol/L (ref 3.5–5.1)
Sodium: 139 mmol/L (ref 135–145)

## 2022-02-23 LAB — IRON AND TIBC
Iron: 47 ug/dL (ref 28–170)
Saturation Ratios: 18 % (ref 10.4–31.8)
TIBC: 269 ug/dL (ref 250–450)
UIBC: 222 ug/dL

## 2022-02-23 LAB — POCT HEMOGLOBIN-HEMACUE: Hemoglobin: 8.6 g/dL — ABNORMAL LOW (ref 12.0–15.0)

## 2022-02-23 LAB — FERRITIN: Ferritin: 160 ng/mL (ref 11–307)

## 2022-02-23 MED ORDER — EPOETIN ALFA-EPBX 10000 UNIT/ML IJ SOLN
20000.0000 [IU] | INTRAMUSCULAR | Status: DC
  Administered 2022-02-23: 20000 [IU] via SUBCUTANEOUS

## 2022-02-23 MED ORDER — EPOETIN ALFA-EPBX 10000 UNIT/ML IJ SOLN
INTRAMUSCULAR | Status: AC
  Filled 2022-02-23: qty 2

## 2022-02-24 LAB — PTH, INTACT AND CALCIUM
Calcium, Total (PTH): 8 mg/dL — ABNORMAL LOW (ref 8.7–10.3)
PTH: 325 pg/mL — ABNORMAL HIGH (ref 15–65)

## 2022-03-03 ENCOUNTER — Other Ambulatory Visit: Payer: Self-pay

## 2022-03-03 MED ORDER — CARVEDILOL 25 MG PO TABS: 25.0000 mg | ORAL_TABLET | Freq: Two times a day (BID) | ORAL | 2 refills | Status: DC

## 2022-03-06 ENCOUNTER — Other Ambulatory Visit (HOSPITAL_COMMUNITY): Payer: Self-pay | Admitting: *Deleted

## 2022-03-09 ENCOUNTER — Encounter (HOSPITAL_COMMUNITY)
Admission: RE | Admit: 2022-03-09 | Discharge: 2022-03-09 | Disposition: A | Discharge status: Home / Self Care | Payer: Medicare PPO | Source: Ambulatory Visit | Attending: Nephrology | Admitting: Nephrology

## 2022-03-09 VITALS — BP 172/57 | HR 68 | Temp 97.8°F | Resp 17

## 2022-03-09 DIAGNOSIS — D508 Other iron deficiency anemias: Secondary | ICD-10-CM

## 2022-03-09 DIAGNOSIS — D638 Anemia in other chronic diseases classified elsewhere: Secondary | ICD-10-CM

## 2022-03-09 LAB — POCT HEMOGLOBIN-HEMACUE: Hemoglobin: 8.9 g/dL — ABNORMAL LOW (ref 12.0–15.0)

## 2022-03-09 MED ORDER — EPOETIN ALFA-EPBX 10000 UNIT/ML IJ SOLN
20000.0000 [IU] | INTRAMUSCULAR | Status: DC
  Administered 2022-03-09: 20000 [IU] via SUBCUTANEOUS

## 2022-03-09 MED ORDER — EPOETIN ALFA-EPBX 10000 UNIT/ML IJ SOLN
INTRAMUSCULAR | Status: AC
  Filled 2022-03-09: qty 2

## 2022-03-11 ENCOUNTER — Ambulatory Visit: Payer: Self-pay

## 2022-03-11 NOTE — Patient Outreach (Signed)
  Care Coordination   03/11/2022 Name: Michelle Cummings MRN: [id number] DOB: 07-Sep-1950   Care Coordination Outreach Attempts:  An unsuccessful telephone outreach was attempted for a scheduled appointment today.  Follow Up Plan:  Additional outreach attempts will be made to offer the patient care coordination information and services.   Encounter Outcome:  No Answer   Care Coordination Interventions:  No, not indicated    Barb Merino, RN, BSN, CCM Care Management Coordinator [facility] Care Management  Direct Phone: [phone]

## 2022-03-13 ENCOUNTER — Other Ambulatory Visit: Payer: Self-pay | Admitting: Internal Medicine

## 2022-03-13 DIAGNOSIS — Z1231 Encounter for screening mammogram for malignant neoplasm of breast: Secondary | ICD-10-CM

## 2022-03-18 ENCOUNTER — Other Ambulatory Visit: Payer: Self-pay | Admitting: Internal Medicine

## 2022-03-20 DIAGNOSIS — E1122 Type 2 diabetes mellitus with diabetic chronic kidney disease: Secondary | ICD-10-CM | POA: Diagnosis not present

## 2022-03-20 DIAGNOSIS — E875 Hyperkalemia: Secondary | ICD-10-CM | POA: Diagnosis not present

## 2022-03-20 DIAGNOSIS — N185 Chronic kidney disease, stage 5: Secondary | ICD-10-CM | POA: Diagnosis not present

## 2022-03-20 DIAGNOSIS — M109 Gout, unspecified: Secondary | ICD-10-CM | POA: Diagnosis not present

## 2022-03-20 DIAGNOSIS — N2581 Secondary hyperparathyroidism of renal origin: Secondary | ICD-10-CM | POA: Diagnosis not present

## 2022-03-20 DIAGNOSIS — I120 Hypertensive chronic kidney disease with stage 5 chronic kidney disease or end stage renal disease: Secondary | ICD-10-CM | POA: Diagnosis not present

## 2022-03-23 ENCOUNTER — Encounter (HOSPITAL_COMMUNITY)
Admission: RE | Admit: 2022-03-23 | Discharge: 2022-03-23 | Disposition: A | Discharge status: Home / Self Care | Payer: Medicare PPO | Source: Ambulatory Visit | Attending: Nephrology | Admitting: Nephrology

## 2022-03-23 ENCOUNTER — Encounter (HOSPITAL_COMMUNITY): Payer: Medicare PPO

## 2022-03-23 VITALS — BP 190/61 | HR 67 | Temp 97.5°F | Resp 17

## 2022-03-23 DIAGNOSIS — D508 Other iron deficiency anemias: Secondary | ICD-10-CM | POA: Diagnosis not present

## 2022-03-23 DIAGNOSIS — D638 Anemia in other chronic diseases classified elsewhere: Secondary | ICD-10-CM | POA: Diagnosis not present

## 2022-03-23 LAB — POCT HEMOGLOBIN-HEMACUE: Hemoglobin: 9.8 g/dL — ABNORMAL LOW (ref 12.0–15.0)

## 2022-03-23 MED ORDER — SODIUM CHLORIDE 0.9 % IV SOLN
510.0000 mg | INTRAVENOUS | Status: DC
  Administered 2022-03-23: 510 mg via INTRAVENOUS
  Filled 2022-03-23: qty 510

## 2022-03-23 MED ORDER — EPOETIN ALFA-EPBX 10000 UNIT/ML IJ SOLN: 20000.0000 [IU] | INTRAMUSCULAR | Status: DC

## 2022-03-23 MED ORDER — EPOETIN ALFA-EPBX 10000 UNIT/ML IJ SOLN
INTRAMUSCULAR | Status: AC
  Administered 2022-03-23: 20000 [IU]
  Filled 2022-03-23: qty 2

## 2022-03-31 DIAGNOSIS — K219 Gastro-esophageal reflux disease without esophagitis: Secondary | ICD-10-CM | POA: Diagnosis not present

## 2022-03-31 DIAGNOSIS — K5900 Constipation, unspecified: Secondary | ICD-10-CM | POA: Diagnosis not present

## 2022-03-31 DIAGNOSIS — Z86010 Personal history of colonic polyps: Secondary | ICD-10-CM | POA: Diagnosis not present

## 2022-03-31 DIAGNOSIS — D509 Iron deficiency anemia, unspecified: Secondary | ICD-10-CM | POA: Diagnosis not present

## 2022-04-01 ENCOUNTER — Other Ambulatory Visit: Payer: Self-pay | Admitting: *Deleted

## 2022-04-01 DIAGNOSIS — N183 Chronic kidney disease, stage 3 unspecified: Secondary | ICD-10-CM

## 2022-04-02 ENCOUNTER — Ambulatory Visit: Payer: Self-pay

## 2022-04-02 ENCOUNTER — Ambulatory Visit (HOSPITAL_COMMUNITY): Payer: Medicare PPO

## 2022-04-02 NOTE — Patient Outreach (Signed)
  Care Coordination   Follow Up Visit Note   04/02/2022 Name: Michelle Cummings MRN: [id number] DOB: [birth year]  Michelle Cummings is a 72 y.o. year old female who sees Glendale Chard, MD for primary care. I spoke with  Michelle Cummings by phone today.  What matters to the patients health and wellness today?  Patient will have her abdominal spasms evaluated by PCP on 04/06/22.     Goals Addressed             This Visit's Progress    To have muscle spasms treated and evaluated       Care Coordination Interventions: Evaluation of current treatment plan related to muscle  and patient's adherence to plan as established by provider Determined patient is experiencing abdominal muscle spasms, determined she reported symptoms to both her Nephrologist and Gastroenterologist with unknown etiology, she was directed to discuss her symptoms with PCP Determined patient is scheduled to f/u with Dr. Baird Cancer 04/06/22 @4  PM for further evaluation          To understand my kidney disease       Care Coordination Interventions: Assessed the Patient understanding of chronic kidney disease    Evaluation of current treatment plan related to chronic kidney disease self management and patient's adherence to plan as established by provider     Determined patient has started her work up for kidney transplant  Discussed patient has completed several tests as directed in preparation for pre-transplant  Reviewed next upcoming scheduled appointment with the renal transplant team is scheduled for 06/15/22    Interventions Today    Flowsheet Row Most Recent Value  Chronic Disease   Chronic disease during today's visit Chronic Kidney Disease/End Stage Renal Disease (ESRD), Other  [muscle spasms]  General Interventions   General Interventions Discussed/Reviewed General Interventions Discussed, General Interventions Reviewed, Doctor Visits  Doctor Visits Discussed/Reviewed PCP, Doctor Visits Reviewed  Education Interventions    Education Provided Provided Education  Provided Verbal Education On Medication, When to see the doctor  Pharmacy Interventions   Pharmacy Dicussed/Reviewed Medications and their functions, Pharmacy Topics Discussed          SDOH assessments and interventions completed:  No     Care Coordination Interventions:  Yes, provided   Follow up plan: Follow up call scheduled for 05/11/22 @11 :30 AM     Encounter Outcome:  Pt. Visit Completed

## 2022-04-02 NOTE — Patient Instructions (Signed)
Visit Information  Thank you for taking time to visit with me today. Please don't hesitate to contact me if I can be of assistance to you.   Following are the goals we discussed today:   Goals Addressed             This Visit's Progress    To have muscle spasms treated and evaluated       Care Coordination Interventions: Evaluation of current treatment plan related to muscle  and patient's adherence to plan as established by provider Determined patient is experiencing abdominal muscle spasms, determined she reported symptoms to both her Nephrologist and Gastroenterologist with unknown etiology, she was directed to discuss her symptoms with PCP Determined patient is scheduled to f/u with Dr. Baird Cancer 04/06/22 @4  PM for further evaluation          To understand my kidney disease       Care Coordination Interventions: Assessed the Patient understanding of chronic kidney disease    Evaluation of current treatment plan related to chronic kidney disease self management and patient's adherence to plan as established by provider     Determined patient has started her work up for kidney transplant  Discussed patient has completed several tests as directed in preparation for pre-transplant  Reviewed next upcoming scheduled appointment with the renal transplant team is scheduled for 06/15/22        Our next appointment is by telephone on 05/11/22 at 11:30 AM  Please call the care guide team at [phone] if you need to cancel or reschedule your appointment.   If you are experiencing a Mental Health or [place] or need someone to talk to, please call 1-800-273-TALK (toll free, 24 hour hotline) go to [facility] [address], [place] [phone])  Patient verbalizes understanding of instructions and care plan provided today and agrees to view in [place]. Active MyChart status and patient understanding of how to access instructions and  care plan via MyChart confirmed with patient.     Barb Merino, RN, BSN, CCM Care Management Coordinator [facility] Care Management Direct Phone: [phone]

## 2022-04-04 ENCOUNTER — Encounter (HOSPITAL_COMMUNITY): Payer: Self-pay

## 2022-04-04 ENCOUNTER — Ambulatory Visit (INDEPENDENT_AMBULATORY_CARE_PROVIDER_SITE_OTHER): Payer: Medicare PPO

## 2022-04-04 ENCOUNTER — Ambulatory Visit (HOSPITAL_COMMUNITY)
Admission: EM | Admit: 2022-04-04 | Discharge: 2022-04-04 | Disposition: A | Discharge status: Home / Self Care | Payer: Medicare PPO | Attending: Emergency Medicine | Admitting: Emergency Medicine

## 2022-04-04 DIAGNOSIS — R1084 Generalized abdominal pain: Secondary | ICD-10-CM | POA: Diagnosis not present

## 2022-04-04 DIAGNOSIS — R079 Chest pain, unspecified: Secondary | ICD-10-CM | POA: Diagnosis not present

## 2022-04-04 DIAGNOSIS — N184 Chronic kidney disease, stage 4 (severe): Secondary | ICD-10-CM | POA: Diagnosis not present

## 2022-04-04 DIAGNOSIS — R059 Cough, unspecified: Secondary | ICD-10-CM | POA: Diagnosis not present

## 2022-04-04 DIAGNOSIS — R051 Acute cough: Secondary | ICD-10-CM | POA: Diagnosis not present

## 2022-04-04 MED ORDER — BENZONATATE 100 MG PO CAPS: 100.0000 mg | ORAL_CAPSULE | Freq: Three times a day (TID) | ORAL | 0 refills | Status: DC

## 2022-04-04 NOTE — ED Provider Notes (Signed)
Michelle Cummings    CSN: [id number] Arrival date & time: 04/04/22  1011      History   Chief Complaint Chief Complaint  Patient presents with   Abdominal Pain    HPI Michelle Cummings is a 72 y.o. female.   Patient presents to clinic for generalized abdominal pain that has been ongoing for the past 2 weeks.  She has also been coughing up phlegm has some rib cage soreness, this has been ongoing.  She has been to her primary care, her nephrologist and gastroenterologist for this issue without resolve.  She was diagnosed with constipation, has been taking some stool softeners and had a normal bowel movement last night.  She does have chronic kidney disease and is currently being evaluated for a renal transplant.  She denies any nausea, vomiting or diarrhea.  Denies any changes to stool.  Recently had a slightly elevated amylase, elevated BNP, positive CMV, and was found to be anemic on labs drawn on March 19.   The history is provided by the patient.  Abdominal Pain Associated symptoms: constipation and cough   Associated symptoms: no chest pain, no chills, no diarrhea, no dysuria, no fever, no hematuria, no nausea, no shortness of breath, no sore throat and no vomiting     Past Medical History:  Diagnosis Date   Arthritis    Cataracts, bilateral    Cervical disc disease    Chronic kidney disease    Dr. Hillery Hunter yearly for CKD stage II as of 11/26/11   Diabetes mellitus ([place])    Eczema    Glaucoma    Heart murmur    History of chicken pox    Hyperlipidemia    Hypertension    Seasonal allergies    Thyroid nodule     Patient Active Problem List   Diagnosis Date Noted   Chronic renal disease, stage IV ([place]) 01/27/2022   Abnormal feces 07/15/2021   Colon cancer screening 07/15/2021   Flatulence, eructation and gas pain 07/15/2021   Gastroesophageal reflux disease 07/15/2021   Iron deficiency anemia 07/15/2021   Personal history of colonic polyps 07/15/2021    Achilles tendinitis of right lower extremity 05/22/2020   Great toe pain, left 05/22/2020   Hypertensive nephropathy 01/26/2020   Abnormal ankle brachial index (ABI) 01/26/2020   Adjustment disorder with anxiety 01/26/2020   Atherosclerosis of aorta ([place]) 02/01/2018   Gout 07/02/2016   Anemia of chronic disease 06/07/2016   HLD (hyperlipidemia) 06/02/2016   Glaucoma 06/02/2016   Chronic kidney disease, stage 3, mod decreased GFR ([place]) 06/02/2016   HTN (hypertension) 03/05/2011   Type 2 diabetes mellitus with stage 3 chronic kidney disease ([place]) 03/05/2011    Past Surgical History:  Procedure Laterality Date   CESAREAN SECTION  1978   COLONOSCOPY     ORIF ANKLE FRACTURE  11/05/2011   Procedure: OPEN REDUCTION INTERNAL FIXATION (ORIF) ANKLE FRACTURE;  Surgeon: Wylene Simmer, MD;  Location: [facility];  Service: Orthopedics;  Laterality: Left;  OPEN REDUCTION INTERNAL FIXATION LEFT LATERAL MALLEOLUS FRACTURE WITH STRESS XRAYS WITH FLOUROSCOPY   ORIF ELBOW FRACTURE  03/05/2011   Procedure: OPEN REDUCTION INTERNAL FIXATION (ORIF) ELBOW/OLECRANON FRACTURE;  Surgeon: Linna Hoff, MD;  Location: [facility];  Service: Orthopedics;  Laterality: Left;   THYROID LOBECTOMY Right 07/20/2012   THYROIDECTOMY Right 07/20/2012   Procedure: RIGHT THYROID LOBECTOMY WITH ISTHMUSECTOMY;  Surgeon: Izora Gala, MD;  Location: [facility];  Service: ENT;  Laterality: Right;   TONSILLECTOMY  TUBAL LIGATION      OB History     Gravida  2   Para  2   Term      Preterm      AB      Living         SAB      IAB      Ectopic      Multiple      Live Births               Home Medications    Prior to Admission medications   Medication Sig Start Date End Date Taking? Authorizing Provider  ACCU-CHEK GUIDE test strip USE AS DIRECTED TO CHECK BLOOD SUGARS 2 TIMES A DAY DX: E11.22 02/01/20  Yes Glendale Chard, MD  Accu-Chek Softclix Lancets lancets USE AS DIRECTED TO CHECK BLOOD  SUGARS 2 TIMES A DAY DX: E11.22 02/01/20  Yes Glendale Chard, MD  allopurinol (ZYLOPRIM) 100 MG tablet TK 2 TS PO D 03/21/18  Yes [provider]  amLODipine (NORVASC) 5 MG tablet Take 1 tablet by mouth daily. 01/18/22  Yes [provider]  atorvastatin (LIPITOR) 40 MG tablet Take one tablet by mouth daily on Monday through Friday. 02/10/22  Yes Glendale Chard, MD  benzonatate (TESSALON) 100 MG capsule Take 1 capsule (100 mg total) by mouth every 8 (eight) hours. 04/04/22  Yes Louretta Shorten, [place] N, FNP  carvedilol (COREG) 25 MG tablet Take 1 tablet (25 mg total) by mouth 2 (two) times daily. 03/03/22  Yes Glendale Chard, MD  diclofenac Sodium (VOLTAREN) 1 % GEL Apply 2 g topically 4 (four) times daily. 09/18/21  Yes Minette Brine, FNP  ferrous sulfate 325 (65 FE) MG tablet Take 1 tablet (325 mg total) by mouth daily with breakfast. 07/02/16  Yes Briscoe Deutscher, DO  fluticasone (FLONASE) 50 MCG/ACT nasal spray Place 2 sprays into both nostrils daily. 01/27/22  Yes Minette Brine, FNP  hydrALAZINE (APRESOLINE) 25 MG tablet Take by mouth. 11/04/21  Yes [provider]  latanoprost (XALATAN) 0.005 % ophthalmic solution place 1 drop into both eyes at bedtime 09/01/16  Yes [provider]  timolol (TIMOPTIC-XR) 0.5 % ophthalmic gel-forming Place 1 drop into both eyes daily.   Yes [provider]  vitamin C (ASCORBIC ACID) 500 MG tablet Take 1,000 mg by mouth daily.   Yes [provider]  Vitamin D, Ergocalciferol, (DRISDOL) 1.25 MG (50000 UNIT) CAPS capsule TAKE 1 CAPSULE BY MOUTH EVERY 7 DAYS 03/19/22  Yes Glendale Chard, MD    Family History Family History  Problem Relation Age of Onset   Hypertension Mother    Early death Father    Breast cancer Sister    Healthy Brother     Social History Social History   Tobacco Use   Smoking status: Never   Smokeless tobacco: Never  Vaping Use   Vaping Use: Never used  Substance Use Topics   Alcohol use: No    Drug use: No     Allergies   Shellfish allergy   Review of Systems Review of Systems  Constitutional:  Negative for chills and fever.  HENT:  Negative for ear pain and sore throat.   Eyes:  Negative for pain and visual disturbance.  Respiratory:  Positive for cough. Negative for shortness of breath and wheezing.   Cardiovascular:  Negative for chest pain and palpitations.  Gastrointestinal:  Positive for abdominal pain and constipation. Negative for diarrhea, nausea and vomiting.  Genitourinary:  Negative for  dysuria and hematuria.  Musculoskeletal:  Negative for arthralgias and back pain.  Skin:  Negative for color change and rash.  Neurological:  Negative for seizures and syncope.  All other systems reviewed and are negative.    Physical Exam Triage Vital Signs ED Triage Vitals  Enc Vitals Group     BP 04/04/22 1130 (!) 187/82     Pulse Rate 04/04/22 1130 92     Resp 04/04/22 1130 16     Temp 04/04/22 1130 97.8 F (36.6 C)     Temp Source 04/04/22 1130 Oral     SpO2 04/04/22 1130 98 %     Weight 04/04/22 1127 146 lb (66.2 kg)     Height 04/04/22 1127 5\' 4"  (1.626 m)     Head Circumference --      Peak Flow --      Pain Score 04/04/22 1126 8     Pain Loc --      Pain Edu? --      Excl. in [place]? --    No data found.  Updated Vital Signs BP (!) 187/82 (BP Location: Left Arm)   Pulse 92   Temp 97.8 F (36.6 C) (Oral)   Resp 16   Ht 5\' 4"  (1.626 m)   Wt 146 lb (66.2 kg)   SpO2 98%   BMI 25.06 kg/m   Visual Acuity Right Eye Distance:   Left Eye Distance:   Bilateral Distance:    Right Eye Near:   Left Eye Near:    Bilateral Near:     Physical Exam Vitals and nursing note reviewed.  Constitutional:      General: She is not in acute distress.    Appearance: She is well-developed.  HENT:     Head: Normocephalic and atraumatic.     Mouth/Throat:     Mouth: Mucous membranes are moist.  Eyes:     Conjunctiva/sclera: Conjunctivae normal.   Cardiovascular:     Rate and Rhythm: Normal rate and regular rhythm.     Heart sounds: No murmur heard. Pulmonary:     Effort: Pulmonary effort is normal. No respiratory distress.     Breath sounds: Normal breath sounds.  Abdominal:     General: Abdomen is flat. Bowel sounds are normal.     Palpations: Abdomen is soft.     Tenderness: There is generalized abdominal tenderness.     Hernia: No hernia is present.  Musculoskeletal:        General: No swelling.     Cervical back: Neck supple.  Skin:    General: Skin is warm and dry.     Capillary Refill: Capillary refill takes less than 2 seconds.  Neurological:     Mental Status: She is alert.  Psychiatric:        Mood and Affect: Mood normal.      UC Treatments / Results  Labs (all labs ordered are listed, but only abnormal results are displayed) Labs Reviewed  CBC WITH DIFFERENTIAL/PLATELET  COMPREHENSIVE METABOLIC PANEL  LIPASE, BLOOD  AMYLASE    EKG   Radiology DG Chest 2 View  Result Date: 04/04/2022 CLINICAL DATA:  Cough with belly pain for 2 weeks. EXAM: CHEST - 2 VIEW COMPARISON:  Radiograph 08/04/2017 and 07/13/2012. FINDINGS: The heart size and mediastinal contours are stable. The lungs are clear. There is no pleural effusion or pneumothorax. No acute osseous findings are identified. There are stable degenerative changes in the spine and shoulders. IMPRESSION: Stable chest. No  active cardiopulmonary process. Electronically Signed   By: Richardean Sale M.D.   On: 04/04/2022 12:46    Procedures Procedures (including critical care time)  Medications Ordered in UC Medications - No data to display  Initial Impression / Assessment and Plan / UC Course  I have reviewed the triage vital signs and the nursing notes.  Pertinent labs & imaging results that were available during my care of the patient were reviewed by me and considered in my medical decision making (see chart for details).  Vitals in triage reviewed,  patient is hemodynamically stable.  Is hypertensive with history of same.  Does have generalized abdominal tenderness with active bowel sounds, without obvious masses or hernia to palpation.  Lungs vesicular posteriorly.  Due to ongoing cough with production will obtain chest x-ray in clinic, which was negative for acute infection or infiltrate.  Discussed limitations of abdominal exam in the urgent care, labs obtained to rule out acute abnormalities and will be compared to previous values from 11 days prior.  Without red flag symptoms of rebound tenderness, fever, tachycardia or acute worsening of pain, advised to follow-up with primary care for further evaluation and emergency department if symptoms worsen.  Patient verbalized understanding, no questions at this time.      Final Clinical Impressions(s) / UC Diagnoses   Final diagnoses:  Acute cough  Generalized abdominal pain  Chronic kidney disease (CKD), stage IV (severe) (HCC)     Discharge Instructions      Your chest x-ray was without signs of infection or rib fractures.  I am giving you some Tessalon Perles to take every 8 hours for cough suppression.  I suggest sleeping with a humidifier and drinking plenty of water to help loosen up any secretions or mucus that you have.  We have drawn labs to help further evaluate your abdominal pain and I will call and update you with these results.  Please follow-up with your primary care provider regarding further management of your abdominal pain.  Please continue to take the stool softener as constipation may be contributing to your pain.  Please seek immediate care if you develop vomiting, uncontrolled abdominal pain, fever, loss of consciousness, or any worsening of symptoms.      ED Prescriptions     Medication Sig Dispense Auth. Provider   benzonatate (TESSALON) 100 MG capsule Take 1 capsule (100 mg total) by mouth every 8 (eight) hours. 21 capsule Ovella [place], [place], [place]       PDMP not reviewed this encounter.   Ercil Cassis, [place] N, [place] 04/04/22 1256

## 2022-04-04 NOTE — ED Triage Notes (Signed)
Patient here today with c/o belly pain X 2 weeks. Sometimes when she coughs up phlegm. Patient went to her PCP, and her Kidney doctor, and a colon doctor. She was prescribed stool softeners which helped with her bowel movements but did not help with the pain. Patient is thinking that she may need labs. No N&V, No fever.

## 2022-04-04 NOTE — Discharge Instructions (Addendum)
Your chest x-ray was without signs of infection or rib fractures.  I am giving you some Tessalon Perles to take every 8 hours for cough suppression.  I suggest sleeping with a humidifier and drinking plenty of water to help loosen up any secretions or mucus that you have.  We have drawn labs to help further evaluate your abdominal pain and I will call and update you with these results.  Please follow-up with your primary care provider regarding further management of your abdominal pain.  Please continue to take the stool softener as constipation may be contributing to your pain.  Please seek immediate care if you develop vomiting, uncontrolled abdominal pain, fever, loss of consciousness, or any worsening of symptoms.

## 2022-04-06 ENCOUNTER — Other Ambulatory Visit (HOSPITAL_COMMUNITY)
Admission: RE | Admit: 2022-04-06 | Discharge: 2022-04-06 | Disposition: A | Discharge status: Home / Self Care | Payer: Medicare PPO | Source: Ambulatory Visit

## 2022-04-06 ENCOUNTER — Ambulatory Visit: Payer: Medicare PPO | Admitting: Internal Medicine

## 2022-04-06 ENCOUNTER — Encounter (HOSPITAL_COMMUNITY)
Admission: RE | Admit: 2022-04-06 | Discharge: 2022-04-06 | Disposition: A | Discharge status: Home / Self Care | Payer: Medicare PPO | Source: Ambulatory Visit | Attending: Nephrology | Admitting: Nephrology

## 2022-04-06 ENCOUNTER — Encounter: Payer: Self-pay | Admitting: Internal Medicine

## 2022-04-06 ENCOUNTER — Ambulatory Visit (HOSPITAL_COMMUNITY)
Admission: RE | Admit: 2022-04-06 | Discharge: 2022-04-06 | Disposition: A | Discharge status: Home / Self Care | Payer: Medicare PPO | Source: Ambulatory Visit | Attending: Vascular Surgery | Admitting: Vascular Surgery

## 2022-04-06 ENCOUNTER — Ambulatory Visit (INDEPENDENT_AMBULATORY_CARE_PROVIDER_SITE_OTHER)
Admission: RE | Admit: 2022-04-06 | Discharge: 2022-04-06 | Disposition: A | Discharge status: Home / Self Care | Payer: Medicare PPO | Source: Ambulatory Visit | Attending: Vascular Surgery | Admitting: Vascular Surgery

## 2022-04-06 VITALS — BP 140/80 | HR 73 | Temp 98.1°F | Wt 150.2 lb

## 2022-04-06 VITALS — BP 179/57 | HR 64 | Temp 97.1°F | Resp 17

## 2022-04-06 DIAGNOSIS — Z01419 Encounter for gynecological examination (general) (routine) without abnormal findings: Secondary | ICD-10-CM | POA: Insufficient documentation

## 2022-04-06 DIAGNOSIS — N183 Chronic kidney disease, stage 3 unspecified: Secondary | ICD-10-CM

## 2022-04-06 DIAGNOSIS — K5909 Other constipation: Secondary | ICD-10-CM

## 2022-04-06 DIAGNOSIS — Z2821 Immunization not carried out because of patient refusal: Secondary | ICD-10-CM

## 2022-04-06 DIAGNOSIS — M62838 Other muscle spasm: Secondary | ICD-10-CM | POA: Diagnosis not present

## 2022-04-06 DIAGNOSIS — R051 Acute cough: Secondary | ICD-10-CM

## 2022-04-06 DIAGNOSIS — Z124 Encounter for screening for malignant neoplasm of cervix: Secondary | ICD-10-CM | POA: Insufficient documentation

## 2022-04-06 DIAGNOSIS — D508 Other iron deficiency anemias: Secondary | ICD-10-CM

## 2022-04-06 DIAGNOSIS — D638 Anemia in other chronic diseases classified elsewhere: Secondary | ICD-10-CM

## 2022-04-06 LAB — POCT HEMOGLOBIN-HEMACUE: Hemoglobin: 9.8 g/dL — ABNORMAL LOW (ref 12.0–15.0)

## 2022-04-06 MED ORDER — SODIUM CHLORIDE 0.9 % IV SOLN
510.0000 mg | INTRAVENOUS | Status: DC
  Administered 2022-04-06: 510 mg via INTRAVENOUS
  Filled 2022-04-06: qty 510

## 2022-04-06 MED ORDER — EPOETIN ALFA-EPBX 10000 UNIT/ML IJ SOLN
20000.0000 [IU] | INTRAMUSCULAR | Status: DC
  Administered 2022-04-06: 20000 [IU] via SUBCUTANEOUS

## 2022-04-06 MED ORDER — EPOETIN ALFA-EPBX 10000 UNIT/ML IJ SOLN
INTRAMUSCULAR | Status: AC
  Filled 2022-04-06: qty 2

## 2022-04-06 NOTE — Progress Notes (Signed)
I,Victoria T Hamilton,acting as a scribe for Gwynneth Aliment, MD.,have documented all relevant documentation on the behalf of Gwynneth Aliment, MD,as directed by  Gwynneth Aliment, MD while in the presence of Gwynneth Aliment, MD.    Subjective:     Patient ID: Michelle Cummings , female    DOB: May 21, 1950 , 72 y.o.   MRN: [id number]   Chief Complaint  Patient presents with   Spasms    HPI  Patient presents today for ER f/u. She was seen in ED on 04/04/2022 for further evaluation of abdominal cramps, constipation and cough.    She states her sx have improved since ER visit.  No additional medications were given at the ED. She states she tried a stool softener which did provide some relief.    She wants to have pap smear today as well. She states it is needed so she can have transplant. She is currently being worked up for renal transplant.      Past Medical History:  Diagnosis Date   Arthritis    Cataracts, bilateral    Cervical disc disease    Chronic kidney disease    Dr. Antoine Poche yearly for CKD stage II as of 11/26/11   Diabetes mellitus    Eczema    Glaucoma    Heart murmur    History of chicken pox    Hyperlipidemia    Hypertension    Seasonal allergies    Thyroid nodule      Family History  Problem Relation Age of Onset   Hypertension Mother    Early death Father    Breast cancer Sister    Healthy Brother      Current Outpatient Medications:    ACCU-CHEK GUIDE test strip, USE AS DIRECTED TO CHECK BLOOD SUGARS 2 TIMES A DAY DX: E11.22, Disp: 100 each, Rfl: 2   Accu-Chek Softclix Lancets lancets, USE AS DIRECTED TO CHECK BLOOD SUGARS 2 TIMES A DAY DX: E11.22, Disp: 100 each, Rfl: 2   allopurinol (ZYLOPRIM) 100 MG tablet, TK 2 TS PO D, Disp: , Rfl:    amLODipine (NORVASC) 5 MG tablet, Take 1 tablet by mouth daily., Disp: , Rfl:    atorvastatin (LIPITOR) 40 MG tablet, Take one tablet by mouth daily on Monday through Friday., Disp: 90 tablet, Rfl: 2   benzonatate  (TESSALON) 100 MG capsule, Take 1 capsule (100 mg total) by mouth every 8 (eight) hours., Disp: 21 capsule, Rfl: 0   carvedilol (COREG) 25 MG tablet, Take 1 tablet (25 mg total) by mouth 2 (two) times daily., Disp: 90 tablet, Rfl: 2   diclofenac Sodium (VOLTAREN) 1 % GEL, Apply 2 g topically 4 (four) times daily., Disp: 100 g, Rfl: 2   ferrous sulfate 325 (65 FE) MG tablet, Take 1 tablet (325 mg total) by mouth daily with breakfast., Disp: 30 tablet, Rfl: 3   fluticasone (FLONASE) 50 MCG/ACT nasal spray, Place 2 sprays into both nostrils daily., Disp: 16 g, Rfl: 2   hydrALAZINE (APRESOLINE) 25 MG tablet, Take by mouth., Disp: , Rfl:    latanoprost (XALATAN) 0.005 % ophthalmic solution, place 1 drop into both eyes at bedtime, Disp: , Rfl:    timolol (TIMOPTIC-XR) 0.5 % ophthalmic gel-forming, Place 1 drop into both eyes daily., Disp: , Rfl:    vitamin C (ASCORBIC ACID) 500 MG tablet, Take 1,000 mg by mouth daily., Disp: , Rfl:    Vitamin D, Ergocalciferol, (DRISDOL) 1.25 MG (50000 UNIT) CAPS capsule, TAKE  1 CAPSULE BY MOUTH EVERY 7 DAYS, Disp: 12 capsule, Rfl: 0   Allergies  Allergen Reactions   Shellfish Allergy Itching     Review of Systems  Constitutional: Negative.   Respiratory: Negative.    Cardiovascular: Negative.   Gastrointestinal:  Positive for constipation.  Neurological: Negative.   Psychiatric/Behavioral: Negative.       Today's Vitals   04/06/22 1613  BP: (!) 140/80  Pulse: 73  Temp: 98.1 F (36.7 C)  SpO2: 98%  Weight: 150 lb 3.2 oz (68.1 kg)   Body mass index is 25.78 kg/m.  Wt Readings from Last 3 Encounters:  04/08/22 151 lb (68.5 kg)  04/06/22 150 lb 3.2 oz (68.1 kg)  04/04/22 146 lb (66.2 kg)    Objective:  Physical Exam Vitals and nursing note reviewed. Exam conducted with a chaperone present.  Constitutional:      Appearance: Normal appearance.  HENT:     Head: Normocephalic and atraumatic.     Nose:     Comments: Masked     Mouth/Throat:      Comments: Masked  Eyes:     Extraocular Movements: Extraocular movements intact.  Cardiovascular:     Rate and Rhythm: Normal rate and regular rhythm.     Heart sounds: Normal heart sounds.  Pulmonary:     Effort: Pulmonary effort is normal.     Breath sounds: Normal breath sounds.  Genitourinary:    General: Normal vulva.     Exam position: Lithotomy position.     Pubic Area: No rash or pubic lice.      Tanner stage (genital): 5.     Vagina: No signs of injury.     Cervix: No friability or erythema.     Uterus: Normal.      Adnexa:        Right: No tenderness.         Left: No tenderness.       Comments: Atrophic mucosa  Musculoskeletal:     Cervical back: Normal range of motion.  Lymphadenopathy:     Lower Body: No right inguinal adenopathy. No left inguinal adenopathy.  Skin:    General: Skin is warm.  Neurological:     General: No focal deficit present.     Mental Status: She is alert.  Psychiatric:        Mood and Affect: Mood normal.        Behavior: Behavior normal.     Assessment And Plan:     1. Chronic constipation Comments: Chronic, ER records reviewed. She will take stool softeners prn. Advised to stay well hydrated and to increase her fiber intake.  2. Acute cough Comments: ER notes reviewed, CXR results reviewed. Sx have lessened since ER visit.  3. Encounter for cervical Pap smear with pelvic exam Comments: Pap smear performed as requested. Results will be forwarded to transplant team. - Cytology -Pap Smear  4. Herpes zoster vaccination declined  5. Tetanus, diphtheria, and acellular pertussis (Tdap) vaccination declined   Patient was given opportunity to ask questions. Patient verbalized understanding of the plan and was able to repeat key elements of the plan. All questions were answered to their satisfaction.   I, Gwynneth Aliment, MD, have reviewed all documentation for this visit. The documentation on 04/06/22 for the exam, diagnosis,  procedures, and orders are all accurate and complete.   IF YOU HAVE BEEN REFERRED TO A SPECIALIST, IT MAY TAKE 1-2 WEEKS TO SCHEDULE/PROCESS THE REFERRAL. IF YOU HAVE  NOT HEARD FROM US/SPECIALIST IN TWO WEEKS, PLEASE GIVE [place] A CALL AT [phone].   THE PATIENT IS ENCOURAGED TO PRACTICE SOCIAL DISTANCING DUE TO THE COVID-19 PANDEMIC.

## 2022-04-06 NOTE — Patient Instructions (Signed)
Spasticity Spasticity is a condition in which your muscles contract suddenly and unpredictably (spasm). Spasticity usually affects your arms, legs, or back. It can also affect the way you walk. Spasticity can range from mild muscle stiffness and tightness to severe, uncontrollable muscle spasms. Severe spasticity can be painful and can freeze your muscles in an uncomfortable position. Follow these instructions at home: Managing muscle stiffness and spasms     Wear a brace as told by your health care provider to prevent muscle contractions. Have the affected muscles massaged. If directed, apply heat to the affected muscle area. Use the heat source that your health care provider recommends, such as a moist heat pack or heating pad. Place a towel between your skin and the heat source. Leave the heat on for 20-30 minutes. Remove the heat if your skin turns bright red. This is especially important if you are unable to feel pain, heat, or cold. You may have a greater risk of getting burned. If directed, apply ice to the affected muscle area: Put ice in a plastic bag. Place a towel between your skin and the bag or between your brace and the bag. Leave the ice on for 20 minutes, 2?3 times a day. Activity Stay active as directed by your health care provider. Find a safe exercise program that fits your needs and ability. Maintain good posture when walking and sitting. Work with a physical therapist to learn exercises that will stretch and strengthen your muscles. Do stretching and range-of-motion exercises at home as told by a physical therapist. Work with an occupational therapist. This type of health care provider can help you function better at home and at work. If you have severe spasticity, use mobility aids, such as a walker or cane, as told by your health care provider. General instructions Watch your condition for any changes. Wear loose, comfortable clothing that does not restrict your  movement. Wear closed-toe shoes that fit well and support your feet. Wear shoes that have rubber soles or low heels. Take over-the-counter and prescription medicine only as told by your health care provider. Keep all follow-up visits as told by your health care provider. This is important. Contact a health care provider if you: Have worsening muscle spasms. Develop other symptoms along with spasticity. Have a fever or chills. Experience a burning feeling when you pass urine. Become constipated. Need more support at home. Get help right away if you: Have trouble breathing. Have a muscle spasm that freezes you into a painful position. Cannot walk. Cannot care for yourself at home. Have trouble passing urine or have urinary incontinence. Summary Spasticity is a condition in which your muscles contract suddenly and unpredictably (spasm). Spasticity usually affects your arms, legs, or back. Spasticity can range from mild muscle stiffness and tightness to severe, uncontrollable muscle spasms. Do stretching and range-of-motion exercises at home as told by a physical therapist. Take over-the-counter and prescription medicine only as told by your health care provider. This information is not intended to replace advice given to you by your health care provider. Make sure you discuss any questions you have with your health care provider. Document Revised: 07/12/2020 Document Reviewed: 07/12/2020 Elsevier Patient Education  2023 Elsevier Inc.  

## 2022-04-08 ENCOUNTER — Encounter: Payer: Self-pay | Admitting: Vascular Surgery

## 2022-04-08 ENCOUNTER — Ambulatory Visit: Payer: Medicare PPO | Admitting: Vascular Surgery

## 2022-04-08 VITALS — BP 222/73 | HR 75 | Temp 97.9°F | Resp 20 | Ht 64.0 in | Wt 151.0 lb

## 2022-04-08 DIAGNOSIS — N185 Chronic kidney disease, stage 5: Secondary | ICD-10-CM | POA: Diagnosis not present

## 2022-04-08 LAB — CYTOLOGY - PAP
Comment: NEGATIVE
Diagnosis: NEGATIVE
High risk HPV: NEGATIVE

## 2022-04-08 NOTE — Progress Notes (Signed)
Patient ID: Michelle Cummings, female   DOB: Dec 19, 1950, 72 y.o.   MRN: [id number]  Reason for Consult: No chief complaint on file.   Referred by Michelle Parish, MD  Subjective:     HPI:  Michelle Cummings is a 72 y.o. female with history of chronic kidney disease.  She has never been on dialysis.  She is right-hand dominant she has had a left elbow surgery no other upper extremity surgeries and no chest or breast surgery and denies any ports.  She does not take any blood thinners.  She states that currently she feels pretty good she does workout several times a week at [employer].  Past Medical History:  Diagnosis Date   Arthritis    Cataracts, bilateral    Cervical disc disease    Chronic kidney disease    Dr. Hillery Hunter yearly for CKD stage II as of 11/26/11   Diabetes mellitus    Eczema    Glaucoma    Heart murmur    History of chicken pox    Hyperlipidemia    Hypertension    Seasonal allergies    Thyroid nodule    Family History  Problem Relation Age of Onset   Hypertension Mother    Early death Father    Breast cancer Sister    Healthy Brother    Past Surgical History:  Procedure Laterality Date   CESAREAN SECTION  1978   COLONOSCOPY     ORIF ANKLE FRACTURE  11/05/2011   Procedure: OPEN REDUCTION INTERNAL FIXATION (ORIF) ANKLE FRACTURE;  Surgeon: Wylene Simmer, MD;  Location: [facility];  Service: Orthopedics;  Laterality: Left;  OPEN REDUCTION INTERNAL FIXATION LEFT LATERAL MALLEOLUS FRACTURE WITH STRESS XRAYS WITH FLOUROSCOPY   ORIF ELBOW FRACTURE  03/05/2011   Procedure: OPEN REDUCTION INTERNAL FIXATION (ORIF) ELBOW/OLECRANON FRACTURE;  Surgeon: Linna Hoff, MD;  Location: [facility];  Service: Orthopedics;  Laterality: Left;   THYROID LOBECTOMY Right 07/20/2012   THYROIDECTOMY Right 07/20/2012   Procedure: RIGHT THYROID LOBECTOMY WITH ISTHMUSECTOMY;  Surgeon: Izora Gala, MD;  Location: [facility];  Service: ENT;  Laterality: Right;   TONSILLECTOMY     TUBAL  LIGATION      Short Social History:  Social History   Tobacco Use   Smoking status: Never   Smokeless tobacco: Never  Substance Use Topics   Alcohol use: No    Allergies  Allergen Reactions   Shellfish Allergy Itching    Current Outpatient Medications  Medication Sig Dispense Refill   ACCU-CHEK GUIDE test strip USE AS DIRECTED TO CHECK BLOOD SUGARS 2 TIMES A DAY DX: E11.22 100 each 2   Accu-Chek Softclix Lancets lancets USE AS DIRECTED TO CHECK BLOOD SUGARS 2 TIMES A DAY DX: E11.22 100 each 2   allopurinol (ZYLOPRIM) 100 MG tablet TK 2 TS PO D     amLODipine (NORVASC) 5 MG tablet Take 1 tablet by mouth daily.     atorvastatin (LIPITOR) 40 MG tablet Take one tablet by mouth daily on Monday through Friday. 90 tablet 2   benzonatate (TESSALON) 100 MG capsule Take 1 capsule (100 mg total) by mouth every 8 (eight) hours. 21 capsule 0   carvedilol (COREG) 25 MG tablet Take 1 tablet (25 mg total) by mouth 2 (two) times daily. 90 tablet 2   diclofenac Sodium (VOLTAREN) 1 % GEL Apply 2 g topically 4 (four) times daily. 100 g 2   ferrous sulfate 325 (65 FE) MG tablet Take 1 tablet (  325 mg total) by mouth daily with breakfast. 30 tablet 3   fluticasone (FLONASE) 50 MCG/ACT nasal spray Place 2 sprays into both nostrils daily. 16 g 2   hydrALAZINE (APRESOLINE) 25 MG tablet Take by mouth.     latanoprost (XALATAN) 0.005 % ophthalmic solution place 1 drop into both eyes at bedtime     timolol (TIMOPTIC-XR) 0.5 % ophthalmic gel-forming Place 1 drop into both eyes daily.     vitamin C (ASCORBIC ACID) 500 MG tablet Take 1,000 mg by mouth daily.     Vitamin D, Ergocalciferol, (DRISDOL) 1.25 MG (50000 UNIT) CAPS capsule TAKE 1 CAPSULE BY MOUTH EVERY 7 DAYS 12 capsule 0   No current facility-administered medications for this visit.    Review of Systems  Constitutional:  Constitutional negative. HENT: HENT negative.  Eyes: Eyes negative.  Respiratory: Respiratory negative.  Cardiovascular:  Cardiovascular negative.  GI: Gastrointestinal negative.  Musculoskeletal: Musculoskeletal negative.  Skin: Skin negative.  Neurological: Neurological negative. Hematologic: Hematologic/lymphatic negative.  Psychiatric: Psychiatric negative.        Objective:  Objective   Vitals:   04/08/22 1409  BP: (!) 222/73  Pulse: 75  Resp: 20  Temp: 97.9 F (36.6 C)  SpO2: 96%     Physical Exam HENT:     Head: Normocephalic.     Nose: Nose normal.     Mouth/Throat:     Mouth: Mucous membranes are moist.  Eyes:     Pupils: Pupils are equal, round, and reactive to light.  Cardiovascular:     Rate and Rhythm: Normal rate.     Pulses: Normal pulses.  Pulmonary:     Effort: Pulmonary effort is normal.  Abdominal:     General: Abdomen is flat.  Musculoskeletal:     Cervical back: Normal range of motion and neck supple.     Right lower leg: No edema.     Left lower leg: No edema.  Skin:    General: Skin is warm.     Capillary Refill: Capillary refill takes less than 2 seconds.  Neurological:     General: No focal deficit present.     Mental Status: She is alert.  Psychiatric:        Mood and Affect: Mood normal.        Behavior: Behavior normal.        Thought Content: Thought content normal.        Judgment: Judgment normal.    Data: Right Pre-Dialysis Findings:  +-----------------------+----------+--------------------+---------+--------  +  Location              PSV (cm/s)Intralum. Diam. (cm)Waveform  Comments  +-----------------------+----------+--------------------+---------+--------  +  Brachial Antecub. [id number]        0.45                biphasic            +-----------------------+----------+--------------------+---------+--------  +  Radial Art at Wrist    90        0.16                triphasic           +-----------------------+----------+--------------------+---------+--------  +  Ulnar Art at Wrist     80        0.13                 triphasic           +-----------------------+----------+--------------------+---------+--------    Left Pre-Dialysis Findings:  +-----------------------+----------+--------------------+--------+--------+  Location              PSV (cm/s)Intralum. Diam.  (cm)WaveformComments  +-----------------------+----------+--------------------+--------+--------+   Brachial Antecub. fossa76        0.35                biphasic           +-----------------------+----------+--------------------+--------+--------+   Radial Art at Wrist    91        0.17                biphasic           +-----------------------+----------+--------------------+--------+--------+   Ulnar Art at Wrist     83        0.18                biphasic           +-----------------------+----------+--------------------+--------+--------+   Summary:    Right: No obstruction visualized in the right upper extremity.  Left: No obstruction visualized in the left upper extremity.  *See table(s) above for measurements and observations.   Right Cephalic   Diameter (cm)Depth (cm)Findings   +-----------------+-------------+----------+---------+  Shoulder            0.30                          +-----------------+-------------+----------+---------+  Prox upper arm       0.26                          +-----------------+-------------+----------+---------+  Mid upper arm        0.27                          +-----------------+-------------+----------+---------+  Dist upper arm       0.37               branching  +-----------------+-------------+----------+---------+  Antecubital fossa    0.44               branching  +-----------------+-------------+----------+---------+  Prox forearm         0.22                          +-----------------+-------------+----------+---------+  Mid forearm          0.21                           +-----------------+-------------+----------+---------+  Dist forearm         0.13               branching  +-----------------+-------------+----------+---------+   +-----------------+-------------+----------+---------+  Right Basilic    Diameter (cm)Depth (cm)Findings   +-----------------+-------------+----------+---------+  Prox upper arm       0.50                          +-----------------+-------------+----------+---------+  Mid upper arm        0.48               branching  +-----------------+-------------+----------+---------+  Dist upper arm       0.28                          +-----------------+-------------+----------+---------+  Antecubital fossa    0.24                          +-----------------+-------------+----------+---------+   +-----------------+-------------+----------+---------+  Left Cephalic    Diameter (cm)Depth (cm)Findings   +-----------------+-------------+----------+---------+  Shoulder            0.29                          +-----------------+-------------+----------+---------+  Prox upper arm       0.32                          +-----------------+-------------+----------+---------+  Mid upper arm        0.33                          +-----------------+-------------+----------+---------+  Dist upper arm       0.31               branching  +-----------------+-------------+----------+---------+  Antecubital fossa    0.57               branching  +-----------------+-------------+----------+---------+  Prox forearm         0.25                          +-----------------+-------------+----------+---------+  Mid forearm          0.22                          +-----------------+-------------+----------+---------+  Wrist               0.15                          +-----------------+-------------+----------+---------+   +-----------------+-------------+----------+---------+  Left  Basilic     Diameter (cm)Depth (cm)Findings   +-----------------+-------------+----------+---------+  Mid upper arm        0.35                          +-----------------+-------------+----------+---------+  Dist upper arm       0.37                          +-----------------+-------------+----------+---------+  Antecubital fossa    0.27               branching  +-----------------+-------------+----------+---------+  Prox forearm         0.16                          +-----------------+-------------+----------+---------+        Assessment/Plan:    72 year old female with CKD 5 here for discussion of dialysis access.  We have discussed proceeding with her nondominant left upper extremity with AV fistula and we have been asked to hold on graft.  Plan will be for cephalic versus basilic vein fistula creation on a nondialysis day in the near future.  We discussed the risks of primary nonfunction as well as steal and injury to surrounding structures of the vein and the artery and the need for further procedures in the future.  She demonstrates good understanding and we will get her today.     Waynetta Sandy MD [facility]

## 2022-04-10 ENCOUNTER — Other Ambulatory Visit: Payer: Self-pay

## 2022-04-10 DIAGNOSIS — N185 Chronic kidney disease, stage 5: Secondary | ICD-10-CM

## 2022-04-20 ENCOUNTER — Encounter (HOSPITAL_COMMUNITY)
Admission: RE | Admit: 2022-04-20 | Discharge: 2022-04-20 | Disposition: A | Discharge status: Home / Self Care | Payer: Medicare PPO | Source: Ambulatory Visit | Attending: Nephrology | Admitting: Nephrology

## 2022-04-20 VITALS — BP 162/71 | HR 95 | Temp 97.9°F | Resp 18

## 2022-04-20 DIAGNOSIS — D508 Other iron deficiency anemias: Secondary | ICD-10-CM

## 2022-04-20 DIAGNOSIS — D638 Anemia in other chronic diseases classified elsewhere: Secondary | ICD-10-CM | POA: Diagnosis not present

## 2022-04-20 LAB — IRON AND TIBC
Iron: 62 ug/dL (ref 28–170)
Saturation Ratios: 27 % (ref 10.4–31.8)
TIBC: 227 ug/dL — ABNORMAL LOW (ref 250–450)
UIBC: 165 ug/dL

## 2022-04-20 LAB — RENAL FUNCTION PANEL
Albumin: 3 g/dL — ABNORMAL LOW (ref 3.5–5.0)
Anion gap: 22 — ABNORMAL HIGH (ref 5–15)
BUN: 159 mg/dL — ABNORMAL HIGH (ref 8–23)
CO2: 11 mmol/L — ABNORMAL LOW (ref 22–32)
Calcium: 7.1 mg/dL — ABNORMAL LOW (ref 8.9–10.3)
Chloride: 101 mmol/L (ref 98–111)
Creatinine, Ser: 11.69 mg/dL — ABNORMAL HIGH (ref 0.44–1.00)
GFR, Estimated: 3 mL/min — ABNORMAL LOW (ref >60)
Glucose, Bld: 148 mg/dL — ABNORMAL HIGH (ref 70–99)
Phosphorus: >30 mg/dL — ABNORMAL HIGH (ref 2.5–4.6)
Potassium: 4.6 mmol/L (ref 3.5–5.1)
Sodium: 134 mmol/L — ABNORMAL LOW (ref 135–145)

## 2022-04-20 LAB — FERRITIN: Ferritin: 514 ng/mL — ABNORMAL HIGH (ref 11–307)

## 2022-04-20 LAB — POCT HEMOGLOBIN-HEMACUE: Hemoglobin: 10.1 g/dL — ABNORMAL LOW (ref 12.0–15.0)

## 2022-04-20 MED ORDER — EPOETIN ALFA-EPBX 10000 UNIT/ML IJ SOLN
INTRAMUSCULAR | Status: AC
  Filled 2022-04-20: qty 2

## 2022-04-20 MED ORDER — EPOETIN ALFA-EPBX 10000 UNIT/ML IJ SOLN
20000.0000 [IU] | INTRAMUSCULAR | Status: DC
  Administered 2022-04-20: 20000 [IU] via SUBCUTANEOUS

## 2022-04-21 ENCOUNTER — Ambulatory Visit (INDEPENDENT_AMBULATORY_CARE_PROVIDER_SITE_OTHER): Payer: Medicare PPO | Admitting: Podiatry

## 2022-04-21 ENCOUNTER — Ambulatory Visit: Payer: Medicare PPO | Admitting: Nurse Practitioner

## 2022-04-21 ENCOUNTER — Encounter: Payer: Self-pay | Admitting: Nurse Practitioner

## 2022-04-21 ENCOUNTER — Emergency Department (HOSPITAL_COMMUNITY)
Admission: EM | Admit: 2022-04-21 | Discharge: 2022-04-21 | Disposition: A | Discharge status: Home / Self Care | Payer: Medicare PPO | Attending: Emergency Medicine | Admitting: Emergency Medicine

## 2022-04-21 ENCOUNTER — Other Ambulatory Visit: Payer: Self-pay

## 2022-04-21 ENCOUNTER — Emergency Department (HOSPITAL_COMMUNITY): Payer: Medicare PPO

## 2022-04-21 VITALS — BP 161/75

## 2022-04-21 VITALS — BP 140/54 | HR 80 | Temp 98.4°F | Ht 64.0 in | Wt 147.4 lb

## 2022-04-21 DIAGNOSIS — R0789 Other chest pain: Secondary | ICD-10-CM | POA: Diagnosis not present

## 2022-04-21 DIAGNOSIS — E119 Type 2 diabetes mellitus without complications: Secondary | ICD-10-CM

## 2022-04-21 DIAGNOSIS — M79675 Pain in left toe(s): Secondary | ICD-10-CM

## 2022-04-21 DIAGNOSIS — J9811 Atelectasis: Secondary | ICD-10-CM | POA: Diagnosis not present

## 2022-04-21 DIAGNOSIS — R079 Chest pain, unspecified: Secondary | ICD-10-CM | POA: Insufficient documentation

## 2022-04-21 DIAGNOSIS — M79674 Pain in right toe(s): Secondary | ICD-10-CM | POA: Diagnosis not present

## 2022-04-21 DIAGNOSIS — B351 Tinea unguium: Secondary | ICD-10-CM

## 2022-04-21 DIAGNOSIS — N185 Chronic kidney disease, stage 5: Secondary | ICD-10-CM | POA: Diagnosis not present

## 2022-04-21 DIAGNOSIS — E1122 Type 2 diabetes mellitus with diabetic chronic kidney disease: Secondary | ICD-10-CM | POA: Insufficient documentation

## 2022-04-21 DIAGNOSIS — N189 Chronic kidney disease, unspecified: Secondary | ICD-10-CM | POA: Diagnosis not present

## 2022-04-21 DIAGNOSIS — R062 Wheezing: Secondary | ICD-10-CM | POA: Diagnosis not present

## 2022-04-21 DIAGNOSIS — I120 Hypertensive chronic kidney disease with stage 5 chronic kidney disease or end stage renal disease: Secondary | ICD-10-CM | POA: Diagnosis not present

## 2022-04-21 DIAGNOSIS — R600 Localized edema: Secondary | ICD-10-CM | POA: Diagnosis not present

## 2022-04-21 DIAGNOSIS — L84 Corns and callosities: Secondary | ICD-10-CM

## 2022-04-21 DIAGNOSIS — Z992 Dependence on renal dialysis: Secondary | ICD-10-CM | POA: Diagnosis not present

## 2022-04-21 DIAGNOSIS — Z1152 Encounter for screening for COVID-19: Secondary | ICD-10-CM | POA: Diagnosis not present

## 2022-04-21 DIAGNOSIS — Z79899 Other long term (current) drug therapy: Secondary | ICD-10-CM | POA: Insufficient documentation

## 2022-04-21 DIAGNOSIS — R9431 Abnormal electrocardiogram [ECG] [EKG]: Secondary | ICD-10-CM | POA: Diagnosis not present

## 2022-04-21 DIAGNOSIS — K5900 Constipation, unspecified: Secondary | ICD-10-CM | POA: Insufficient documentation

## 2022-04-21 DIAGNOSIS — I129 Hypertensive chronic kidney disease with stage 1 through stage 4 chronic kidney disease, or unspecified chronic kidney disease: Secondary | ICD-10-CM | POA: Diagnosis not present

## 2022-04-21 LAB — CBC
HCT: 30.7 % — ABNORMAL LOW (ref 36.0–46.0)
Hemoglobin: 9.6 g/dL — ABNORMAL LOW (ref 12.0–15.0)
MCH: 27 pg (ref 26.0–34.0)
MCHC: 31.3 g/dL (ref 30.0–36.0)
MCV: 86.2 fL (ref 80.0–100.0)
Platelets: 238 10*3/uL (ref 150–400)
RBC: 3.56 MIL/uL — ABNORMAL LOW (ref 3.87–5.11)
RDW: 17.1 % — ABNORMAL HIGH (ref 11.5–15.5)
WBC: 10.3 10*3/uL (ref 4.0–10.5)
nRBC: 0 % (ref 0.0–0.2)

## 2022-04-21 LAB — MAGNESIUM: Magnesium: 2.7 mg/dL — ABNORMAL HIGH (ref 1.7–2.4)

## 2022-04-21 LAB — TROPONIN I (HIGH SENSITIVITY)
Troponin I (High Sensitivity): 17 ng/L (ref <18)
Troponin I (High Sensitivity): 17 ng/L (ref <18)

## 2022-04-21 LAB — BRAIN NATRIURETIC PEPTIDE: B Natriuretic Peptide: 1129.4 pg/mL — ABNORMAL HIGH (ref 0.0–100.0)

## 2022-04-21 LAB — BASIC METABOLIC PANEL
Anion gap: 19 — ABNORMAL HIGH (ref 5–15)
BUN: 160 mg/dL — ABNORMAL HIGH (ref 8–23)
CO2: 10 mmol/L — ABNORMAL LOW (ref 22–32)
Calcium: 7.3 mg/dL — ABNORMAL LOW (ref 8.9–10.3)
Chloride: 108 mmol/L (ref 98–111)
Creatinine, Ser: 12.02 mg/dL — ABNORMAL HIGH (ref 0.44–1.00)
GFR, Estimated: 3 mL/min — ABNORMAL LOW (ref >60)
Glucose, Bld: 121 mg/dL — ABNORMAL HIGH (ref 70–99)
Potassium: 4.7 mmol/L (ref 3.5–5.1)
Sodium: 137 mmol/L (ref 135–145)

## 2022-04-21 LAB — SARS CORONAVIRUS 2 BY RT PCR: SARS Coronavirus 2 by RT PCR: NEGATIVE

## 2022-04-21 MED ORDER — TRIAMCINOLONE ACETONIDE 40 MG/ML IJ SUSP
60.0000 mg | Freq: Once | INTRAMUSCULAR | Status: AC
Start: 2022-04-21 — End: 2022-04-21
  Administered 2022-04-21: 60 mg via INTRAMUSCULAR

## 2022-04-21 MED ORDER — ALBUTEROL SULFATE (2.5 MG/3ML) 0.083% IN NEBU
INHALATION_SOLUTION | RESPIRATORY_TRACT | Status: AC
  Filled 2022-04-21: qty 3

## 2022-04-21 MED ORDER — ALBUTEROL SULFATE (2.5 MG/3ML) 0.083% IN NEBU
2.5000 mg | INHALATION_SOLUTION | Freq: Once | RESPIRATORY_TRACT | Status: AC
  Administered 2022-04-21: 2.5 mg via RESPIRATORY_TRACT

## 2022-04-21 MED ORDER — ALBUTEROL SULFATE HFA 108 (90 BASE) MCG/ACT IN AERS
2.0000 | INHALATION_SPRAY | Freq: Four times a day (QID) | RESPIRATORY_TRACT | 2 refills | Status: DC | PRN
Start: 2022-04-21 — End: 2022-05-08

## 2022-04-21 NOTE — Discharge Instructions (Signed)
You are seen in the emergency department for chest pain and shortness of breath.  You had blood work EKG chest x-ray.  There is no evidence of heart attack but there was some possible signs of fluid overload.  This will need close follow-up with your kidney specialist.  Please continue regular medications.  Return to the emergency department if any worsening or concerning symptoms.

## 2022-04-21 NOTE — Progress Notes (Signed)
I,[name],acting as a Neurosurgeon for Michelle Felts, FNP.,have documented all relevant documentation on the behalf of Michelle Felts, FNP,as directed by  Michelle Felts, FNP while in the presence of Michelle Felts, FNP.    Subjective:     Patient ID: Michelle Cummings , female    DOB: 1950/02/20 , 72 y.o.   MRN: [id number]   Chief Complaint  Patient presents with   Cough    W/wheeze   Shortness of Breath    HPI  Patient presents today for chest pain to the midsternum when she cough (mostly dry) small amount of phlegm and take a deep breath.  Describes as tightness and when she breath it hurts. Denies this occurring in the past. Denies shortness of breath. She was coughing last night more than usual. She has had to use an inhaler when she had covid January 2024,  This been going on since last night. Patient has wheezing and is short of breath.         Past Medical History:  Diagnosis Date   Arthritis    Cataracts, bilateral    Cervical disc disease    Chronic kidney disease    Dr. Antoine Poche yearly for CKD stage II as of 11/26/11   Diabetes mellitus    Eczema    Glaucoma    Heart murmur    History of chicken pox    Hyperlipidemia    Hypertension    Seasonal allergies    Thyroid nodule      Family History  Problem Relation Age of Onset   Hypertension Mother    Early death Father    Breast cancer Sister    Healthy Brother      Current Outpatient Medications:    ACCU-CHEK GUIDE test strip, USE AS DIRECTED TO CHECK BLOOD SUGARS 2 TIMES A DAY DX: E11.22, Disp: 100 each, Rfl: 2   Accu-Chek Softclix Lancets lancets, USE AS DIRECTED TO CHECK BLOOD SUGARS 2 TIMES A DAY DX: E11.22, Disp: 100 each, Rfl: 2   albuterol (VENTOLIN HFA) 108 (90 Base) MCG/ACT inhaler, Inhale 2 puffs into the lungs every 6 (six) hours as needed for wheezing or shortness of breath., Disp: 8 g, Rfl: 2   allopurinol (ZYLOPRIM) 100 MG tablet, TK 2 TS PO D, Disp: , Rfl:    amLODipine (NORVASC) 5 MG tablet,  Take 1 tablet by mouth daily., Disp: , Rfl:    atorvastatin (LIPITOR) 40 MG tablet, Take one tablet by mouth daily on Monday through Friday., Disp: 90 tablet, Rfl: 2   benzonatate (TESSALON) 100 MG capsule, Take 1 capsule (100 mg total) by mouth every 8 (eight) hours., Disp: 21 capsule, Rfl: 0   carvedilol (COREG) 25 MG tablet, Take 1 tablet (25 mg total) by mouth 2 (two) times daily., Disp: 90 tablet, Rfl: 2   diclofenac Sodium (VOLTAREN) 1 % GEL, Apply 2 g topically 4 (four) times daily., Disp: 100 g, Rfl: 2   ferrous sulfate 325 (65 FE) MG tablet, Take 1 tablet (325 mg total) by mouth daily with breakfast., Disp: 30 tablet, Rfl: 3   fluticasone (FLONASE) 50 MCG/ACT nasal spray, Place 2 sprays into both nostrils daily., Disp: 16 g, Rfl: 2   hydrALAZINE (APRESOLINE) 25 MG tablet, Take by mouth., Disp: , Rfl:    latanoprost (XALATAN) 0.005 % ophthalmic solution, place 1 drop into both eyes at bedtime, Disp: , Rfl:    timolol (TIMOPTIC-XR) 0.5 % ophthalmic gel-forming, Place 1 drop into both eyes daily., Disp: ,  Rfl:    vitamin C (ASCORBIC ACID) 500 MG tablet, Take 1,000 mg by mouth daily., Disp: , Rfl:    Vitamin D, Ergocalciferol, (DRISDOL) 1.25 MG (50000 UNIT) CAPS capsule, TAKE 1 CAPSULE BY MOUTH EVERY 7 DAYS, Disp: 12 capsule, Rfl: 0   Allergies  Allergen Reactions   Shellfish Allergy Itching     Review of Systems  Constitutional:  Positive for fatigue.  Respiratory:  Positive for choking, chest tightness, shortness of breath and wheezing.   Cardiovascular:  Positive for chest pain.  Neurological: Negative.   Psychiatric/Behavioral: Negative.       Today's Vitals   04/21/22 1412  BP: (!) 140/54  Pulse: 80  Temp: 98.4 F (36.9 C)  TempSrc: Oral  SpO2: 96%  Weight: 147 lb 6.4 oz (66.9 kg)  Height:  (1.626 m)   Body mass index is 25.3 kg/m.   Objective:  Physical Exam Vitals reviewed.  Constitutional:      General: She is not in acute distress. Pulmonary:      Breath sounds: Examination of the right-upper field reveals decreased breath sounds. Examination of the right-middle field reveals decreased breath sounds. Decreased breath sounds present. No wheezing.  Chest:     Chest wall: Tenderness present. No mass or crepitus.  Skin:    General: Skin is warm and dry.     Capillary Refill: Capillary refill takes less than 2 seconds.  Neurological:     General: No focal deficit present.     Mental Status: She is alert and oriented to person, place, and time.     Cranial Nerves: No cranial nerve deficit.  Psychiatric:        Mood and Affect: Mood normal.        Behavior: Behavior normal. Behavior is cooperative.         Assessment And Plan:     1. Wheezing Comments: Sounds like has bronchial wheezing, and has diminsi - DG Chest 2 View; Future - triamcinolone acetonide (KENALOG-40) injection 60 mg - albuterol (VENTOLIN HFA) 108 (90 Base) MCG/ACT inhaler; Inhale 2 puffs into the lungs every 6 (six) hours as needed for wheezing or shortness of breath.  Dispense: 8 g; Refill: 2  2. Feeling of chest tightness Comments: Kenalog 60 mg IM given. Will also send Rx for albuterol inhaler. - DG Chest 2 View; Future - EKG 12-Lead - albuterol (VENTOLIN HFA) 108 (90 Base) MCG/ACT inhaler; Inhale 2 puffs into the lungs every 6 (six) hours as needed for wheezing or shortness of breath.  Dispense: 8 g; Refill: 2  3. Abnormal EKG Comments: She has ST elevation which is a new finding, also having chest pain on palpation and breathing but also is midsternal. Due to the changes will send to ER     Patient was given opportunity to ask questions. Patient verbalized understanding of the plan and was able to repeat key elements of the plan. All questions were answered to their satisfaction.  Michelle Felts, FNP   I, Michelle Felts, FNP, have reviewed all documentation for this visit. The documentation on 04/21/22 for the exam, diagnosis, procedures, and orders are all  accurate and complete.   IF YOU HAVE BEEN REFERRED TO A SPECIALIST, IT MAY TAKE 1-2 WEEKS TO SCHEDULE/PROCESS THE REFERRAL. IF YOU HAVE NOT HEARD FROM US/SPECIALIST IN TWO WEEKS, PLEASE GIVE [place] A CALL AT [phone].   THE PATIENT IS ENCOURAGED TO PRACTICE SOCIAL DISTANCING DUE TO THE COVID-19 PANDEMIC.

## 2022-04-21 NOTE — ED Provider Triage Note (Signed)
Emergency Medicine Provider Triage Evaluation Note  Michelle Cummings , a 72 y.o. female  was evaluated in triage.  Pt complains of midsternal chest pain that started last night.  States that it is a sharp pain that has been constant since onset.  It is not associated with nausea, vomiting, diaphoresis, lightheadedness, dizziness, syncope, or shortness of breath.  Pain is worse with coughing and taking a deep breath.  Associated cough but no congestion, fever, chills.  No known sick contacts.  No leg pain or swelling.  No history of heart disease, heart failure, or DVT/PE.  Patient went to PCP for evaluation of symptoms this morning and reports since receiving a steroid shot her pain is minimal.  At her PCP visit, patient was noted to have an abnormal EKG and sent to the ED for further evaluation.  Review of Systems  Positive: See HPI Negative: See HPI  Physical Exam  BP (!) 151/63 (BP Location: Right Arm)   Pulse 72   Temp 98.6 F (37 C) (Oral)   Resp 16   SpO2 97%  Gen:   Awake, no distress   Resp:  Normal effort lungs clear to auscultation MSK:   Moves extremities without difficulty no lower extremity edema or tenderness Other:  Regular rate and rhythm, no chest wall tenderness, abdomen soft and nontender, patient neurologically intact  Medical Decision Making  Medically screening exam initiated at 4:05 PM.  Appropriate orders placed.  AMELIE HOLLARS was informed that the remainder of the evaluation will be completed by another provider, this initial triage assessment does not replace that evaluation, and the importance of remaining in the ED until their evaluation is complete.     Tonette Lederer, PA-C 04/21/22 1607

## 2022-04-21 NOTE — ED Provider Notes (Signed)
[place] EMERGENCY DEPARTMENT AT [facility] Provider Note   CSN: [id number] Arrival date & time: 04/21/22  1533     History {Add pertinent medical, surgical, social history, OB history to HPI:1} Chief Complaint  Patient presents with   Chest Pain    Michelle Michelle Cummings is a 72 y.o. female.  She has a history of diabetes, CKD, hypertension.  She went to her primary care doctor today with a complaint of cough productive of some [place] sputum along with some chest discomfort that is been going on since last evening.  She denies any fever nausea vomiting.  She says she maybe feels a little more fatigued.  She received a dose of steroids and a prescription for an inhaler.  Her EKG was noted to be abnormal there so they sent her over to the emergency department for further evaluation.  She says she still feels a little bit of discomfort in her chest.  She has never had this before.  She is not on dialysis yet but has been progressing.  She has some chronic edema.  The history is provided by the patient.  Chest Pain Pain location:  Substernal area Pain quality: tightness   Pain severity:  Mild Onset quality:  Gradual Duration:  2 days Timing:  Constant Progression:  Unchanged Chronicity:  New Relieved by:  None tried Worsened by:  Nothing Ineffective treatments:  None tried Associated symptoms: cough, lower extremity edema and shortness of breath   Associated symptoms: no abdominal pain, no diaphoresis, no fever, no nausea and no vomiting   Risk factors: diabetes mellitus, high cholesterol and hypertension   Risk factors: no smoking        Home Medications Prior to Admission medications   Medication Sig Start Date End Date Taking? Authorizing Provider  ACCU-CHEK GUIDE test strip USE AS DIRECTED TO CHECK BLOOD SUGARS 2 TIMES A DAY DX: E11.22 02/01/20   Dorothyann Peng, MD  Accu-Chek Softclix Lancets lancets USE AS DIRECTED TO CHECK BLOOD SUGARS 2 TIMES A DAY DX: E11.22 02/01/20    Dorothyann Peng, MD  albuterol (VENTOLIN HFA) 108 (90 Base) MCG/ACT inhaler Inhale 2 puffs into the lungs every 6 (six) hours as needed for wheezing or shortness of breath. 04/21/22   [name], FNP  allopurinol (ZYLOPRIM) 100 MG tablet TK 2 TS PO D 03/21/18   [provider]  amLODipine (NORVASC) 5 MG tablet Take 1 tablet by mouth daily. 01/18/22   [provider]  atorvastatin (LIPITOR) 40 MG tablet Take one tablet by mouth daily on Monday through Friday. 02/10/22   Dorothyann Peng, MD  benzonatate (TESSALON) 100 MG capsule Take 1 capsule (100 mg total) by mouth every 8 (eight) hours. 04/04/22   Garrison, [place] N, FNP  carvedilol (COREG) 25 MG tablet Take 1 tablet (25 mg total) by mouth 2 (two) times daily. 03/03/22   Dorothyann Peng, MD  diclofenac Sodium (VOLTAREN) 1 % GEL Apply 2 g topically 4 (four) times daily. 09/18/21   [name], FNP  ferrous sulfate 325 (65 FE) MG tablet Take 1 tablet (325 mg total) by mouth daily with breakfast. 07/02/16   Helane Rima, DO  fluticasone (FLONASE) 50 MCG/ACT nasal spray Place 2 sprays into both nostrils daily. 01/27/22   [name], FNP  hydrALAZINE (APRESOLINE) 25 MG tablet Take by mouth. 11/04/21   [provider]  latanoprost (XALATAN) 0.005 % ophthalmic solution place 1 drop into both eyes at bedtime 09/01/16   [provider]  timolol (TIMOPTIC-XR) 0.5 % ophthalmic gel-forming Place 1 drop into both eyes daily.    [provider]  vitamin C (ASCORBIC ACID) 500 MG tablet Take 1,000 mg by mouth daily.    [provider]  Vitamin D, Ergocalciferol, (DRISDOL) 1.25 MG (50000 UNIT) CAPS capsule TAKE 1 CAPSULE BY MOUTH EVERY 7 DAYS 03/19/22   Dorothyann Peng, MD      Allergies    Shellfish allergy    Review of Systems   Review of Systems  Constitutional:  Negative for diaphoresis and fever.  Respiratory:  Positive for cough and shortness of breath.   Cardiovascular:  Positive for chest pain.   Gastrointestinal:  Negative for abdominal pain, nausea and vomiting.    Physical Exam Updated Vital Signs BP (!) 136/51   Pulse 66   Temp 98.3 F (36.8 C) (Oral)   Resp (!) 24   SpO2 100%  Physical Exam Vitals and nursing note reviewed.  Constitutional:      General: She is not in acute distress.    Appearance: She is well-developed.  HENT:     Head: Normocephalic and atraumatic.  Eyes:     Conjunctiva/sclera: Conjunctivae normal.  Cardiovascular:     Rate and Rhythm: Normal rate and regular rhythm.     Heart sounds: Normal heart sounds. No murmur heard. Pulmonary:     Effort: Pulmonary effort is normal. No respiratory distress.     Breath sounds: Normal breath sounds.  Abdominal:     Palpations: Abdomen is soft.     Tenderness: There is no abdominal tenderness.  Musculoskeletal:        General: No swelling. Normal range of motion.     Cervical back: Neck supple.     Right lower leg: No tenderness. Edema present.     Left lower leg: No tenderness. Edema present.     Comments: Trace edema bilaterally no cords appreciated  Skin:    General: Skin is warm and dry.     Capillary Refill: Capillary refill takes less than 2 seconds.  Neurological:     General: No focal deficit present.     Mental Status: She is alert.     ED Results / Procedures / Treatments   Labs (all labs ordered are listed, but only abnormal results are displayed) Labs Reviewed  BASIC METABOLIC PANEL - Abnormal; Notable for the following components:      Result Value   CO2 10 (*)    Glucose, Bld 121 (*)    BUN 160 (*)    Creatinine, Ser 12.02 (*)    Calcium 7.3 (*)    GFR, Estimated 3 (*)    Anion gap 19 (*)    All other components within normal limits  CBC - Abnormal; Notable for the following components:   RBC 3.56 (*)    Hemoglobin 9.6 (*)    HCT 30.7 (*)    RDW 17.1 (*)    All other components within normal limits  SARS CORONAVIRUS 2 BY RT PCR  BRAIN NATRIURETIC PEPTIDE  MAGNESIUM   TROPONIN I (HIGH SENSITIVITY)  TROPONIN I (HIGH SENSITIVITY)    EKG EKG Interpretation  Date/Time:  Tuesday April 21 2022 21:06:10 EDT Ventricular Rate:  96 PR Interval:  248 QRS Duration: 84 QT Interval:  356 QTC Calculation: 449 R Axis:   -9 Text Interpretation: Sinus rhythm with marked sinus arrhythmia with 1st degree A-V block Otherwise normal ECG When compared with ECG of 21-Apr-2022 15:44, new arrhythmia Confirmed by  Meridee Score [phone]) on 04/21/2022 9:54:17 PM  Radiology DG Chest 2 View  Result Date: 04/21/2022 CLINICAL DATA:  Chest pain EXAM: CHEST - 2 VIEW COMPARISON:  04/04/2022 FINDINGS: The cardiac silhouette, mediastinal and hilar contours are within normal limits and stable. Streaky bibasilar atelectasis but no infiltrates or effusions. No pneumothorax. The bony thorax is intact. IMPRESSION: Streaky bibasilar atelectasis. Electronically Signed   By: Rudie Meyer M.D.   On: 04/21/2022 16:42    Procedures Procedures  {Document cardiac monitor, telemetry assessment procedure when appropriate:1}  Medications Ordered in ED Medications  albuterol (PROVENTIL) (2.5 MG/3ML) 0.083% nebulizer solution 2.5 mg (has no administration in time range)    ED Course/ Medical Decision Making/ A&P   {   Click here for ABCD2, HEART and other calculatorsREFRESH Note before signing :1}                          Medical Decision Making Amount and/or Complexity of Data Reviewed Labs: ordered. Radiology: ordered.  Risk Prescription drug management.   This patient complains of ***; this involves an extensive number of treatment Options and is a complaint that carries with it a high risk of complications and morbidity. The differential includes ***  I ordered, reviewed and interpreted labs, which included *** I ordered medication *** and reviewed PMP when indicated. I ordered imaging studies which included *** and I independently    visualized and interpreted imaging which  showed *** Additional history obtained from *** Previous records obtained and reviewed *** I consulted *** and discussed lab and imaging findings and discussed disposition.  Cardiac monitoring reviewed, *** Social determinants considered, *** Critical Interventions: ***  After the interventions stated above, I reevaluated the patient and found *** Admission and further testing considered, ***   {Document critical care time when appropriate:1} {Document review of labs and clinical decision tools ie heart score, Chads2Vasc2 etc:1}  {Document your independent review of radiology images, and any outside records:1} {Document your discussion with family members, caretakers, and with consultants:1} {Document social determinants of health affecting pt's care:1} {Document your decision making why or why not admission, treatments were needed:1} Final Clinical Impression(s) / ED Diagnoses Final diagnoses:  None    Rx / DC Orders ED Discharge Orders     None

## 2022-04-21 NOTE — Patient Instructions (Signed)
Go to [facility] imaging at [facility] [employer] [facility] [employer] for a chest x-ray. Let us know if you are worse or go to the emergency room for further treatment.

## 2022-04-21 NOTE — ED Triage Notes (Signed)
Pt reports central non radiating chest pain since last night. No associated symptoms. Went to PCP and they sent her to the ED for further eval after having an abnormal. Patient reports her pain has almost resolved since receiving a steroid shot at the office.

## 2022-04-21 NOTE — Progress Notes (Signed)
  Subjective:  Patient ID: Michelle Cummings, female    DOB: 02-15-1950,  MRN: [id number]  Michelle Cummings presents to clinic today for corn(s) b/l feet, callus(es) b/l feet and painful mycotic nails.  Pain interferes with ambulation. Aggravating factors include wearing enclosed shoe gear. Painful toenails interfere with ambulation. Aggravating factors include wearing enclosed shoe gear. Pain is relieved with periodic professional debridement. Painful corns and calluses are aggravated when weightbearing with and without shoegear. Pain is relieved with periodic professional debridement.  Chief Complaint  Patient presents with   Nail Problem    DFC BS-did not check today A1C-6.0 PCP-[facility] PCP VST-03/2022   New problem(s): None.   PCP is Dorothyann Peng, MD.  Allergies  Allergen Reactions   Shellfish Allergy Itching    Review of Systems: Negative except as noted in the HPI.  Objective:  Vitals:   04/21/22 0940 04/21/22 0950  BP: (!) 173/73 (!) 161/75   Michelle Cummings is a pleasant 72 y.o. female WD, WN in NAD. AAO x 3.  Vascular Examination: CFT <3 seconds b/l LE. Palpable DP pulse(s) b/l LE. Faintly palpable PT pulse(s) b/l LE. Pedal hair present. No pain with calf compression b/l. Lower extremity skin temperature gradient within normal limits. No edema noted b/l LE.  Neurological Examination: Sensation grossly intact b/l with 10 gram monofilament. Vibratory sensation intact b/l.   Dermatological Examination: Pedal skin with normal turgor, texture and tone b/l. Toenails 1-5 b/l thick, discolored, elongated with subungual debris and pain on dorsal palpation.   Hyperkeratotic lesion(s) b/l 5th digits and submet head 5 b/l.  No erythema, no edema, no drainage, no fluctuance.  Musculoskeletal Examination: Muscle strength 5/5 to b/l LE. Hammertoe deformity noted 2-5 b/l. Pes planus deformity noted bilateral LE. Wearing diabetic shoes today.  Radiographs: None  Assessment/Plan: 1. Pain  due to onychomycosis of toenails of both feet   2. Corns and callosities   3. Diabetes mellitus without complication    -Patient was evaluated and treated. All patient's and/or POA's questions/concerns answered on today's visit. -Continue foot and shoe inspections daily. Monitor blood glucose per PCP/Endocrinologist's recommendations. -Mycotic toenails 1-5 bilaterally were debrided in length and girth with sterile nail nippers and dremel without incident. -Corn(s) bilateral 5th toes and callus(es) submet head 5 b/l were pared utilizing sterile scalpel blade without incident. Total number debrided =4. -Patient/POA to call should there be question/concern in the interim.   Return in about 3 months (around 07/21/2022).  Freddie Breech, DPM

## 2022-04-22 ENCOUNTER — Telehealth: Payer: Self-pay

## 2022-04-22 LAB — PTH, INTACT AND CALCIUM
Calcium, Total (PTH): 7.2 mg/dL — ABNORMAL LOW (ref 8.7–10.3)
PTH: 394 pg/mL — ABNORMAL HIGH (ref 15–65)

## 2022-04-22 NOTE — Telephone Encounter (Signed)
Patient called requesting to move up her surgery date if possible. Surgery rescheduled to 4/25 with instructions reviewed. Patient verbalized understanding.

## 2022-04-23 ENCOUNTER — Telehealth: Payer: Self-pay

## 2022-04-23 NOTE — Telephone Encounter (Signed)
TOC- 1ST ATTEMPT.

## 2022-04-24 ENCOUNTER — Telehealth: Payer: Self-pay

## 2022-04-24 NOTE — Transitions of Care (Post Inpatient/ED Visit) (Signed)
   04/24/2022  Name: Michelle Cummings MRN: [id number] DOB: 05-23-1950  Today's TOC FU Call Status:    Transition Care Management Follow-up Telephone Call Date of Discharge: 04/21/22 Discharge Facility: [facility] Type of Discharge: Emergency Department Reason for ED Visit: Cardiac Conditions Cardiac Conditions Diagnosis: Chest Pain Persisting How have you been since you were released from the hospital?: Better Any questions or concerns?: No  Items Reviewed: Did you receive and understand the discharge instructions provided?: Yes Medications obtained and verified?: Yes (Medications Reviewed) Any new allergies since your discharge?: No Dietary orders reviewed?: No Do you have support at home?: No  Home Care and Equipment/Supplies: Were Home Health Services Ordered?: No Any new equipment or medical supplies ordered?: No  Functional Questionnaire: Do you need assistance with bathing/showering or dressing?: No Do you need assistance with meal preparation?: No Do you need assistance with eating?: No Do you have difficulty maintaining continence: No Do you need assistance with getting out of bed/getting out of a chair/moving?: No Do you have difficulty managing or taking your medications?: No  Follow up appointments reviewed: PCP Follow-up appointment confirmed?: No MD Provider Line Number:[phone] Given: Yes Specialist Hospital Follow-up appointment confirmed?: No Reason Specialist Follow-Up Not Confirmed: Patient has Specialist Provider Number and will Call for Appointment Do you need transportation to your follow-up appointment?: No Do you understand care options if your condition(s) worsen?: Yes-patient verbalized understanding    SIGNATURE Randa Lynn, CMA

## 2022-04-25 ENCOUNTER — Encounter: Payer: Self-pay | Admitting: Podiatry

## 2022-04-29 ENCOUNTER — Other Ambulatory Visit: Payer: Self-pay

## 2022-04-29 ENCOUNTER — Encounter (HOSPITAL_COMMUNITY): Payer: Self-pay | Admitting: *Deleted

## 2022-04-29 ENCOUNTER — Ambulatory Visit: Payer: Medicare PPO

## 2022-04-29 ENCOUNTER — Inpatient Hospital Stay (HOSPITAL_COMMUNITY)
Admission: EM | Admit: 2022-04-29 | Discharge: 2022-05-05 | DRG: 628 | Disposition: A | Discharge status: Home / Self Care | Payer: Medicare PPO | Attending: Internal Medicine | Admitting: Internal Medicine

## 2022-04-29 ENCOUNTER — Encounter (HOSPITAL_COMMUNITY): Payer: Self-pay | Admitting: Vascular Surgery

## 2022-04-29 ENCOUNTER — Emergency Department (HOSPITAL_COMMUNITY): Payer: Medicare PPO

## 2022-04-29 DIAGNOSIS — N179 Acute kidney failure, unspecified: Secondary | ICD-10-CM | POA: Diagnosis present

## 2022-04-29 DIAGNOSIS — R0602 Shortness of breath: Secondary | ICD-10-CM | POA: Diagnosis not present

## 2022-04-29 DIAGNOSIS — Z8249 Family history of ischemic heart disease and other diseases of the circulatory system: Secondary | ICD-10-CM

## 2022-04-29 DIAGNOSIS — Z79899 Other long term (current) drug therapy: Secondary | ICD-10-CM | POA: Diagnosis not present

## 2022-04-29 DIAGNOSIS — N189 Chronic kidney disease, unspecified: Secondary | ICD-10-CM | POA: Diagnosis not present

## 2022-04-29 DIAGNOSIS — N185 Chronic kidney disease, stage 5: Principal | ICD-10-CM | POA: Diagnosis present

## 2022-04-29 DIAGNOSIS — I471 Supraventricular tachycardia, unspecified: Secondary | ICD-10-CM | POA: Diagnosis present

## 2022-04-29 DIAGNOSIS — N184 Chronic kidney disease, stage 4 (severe): Secondary | ICD-10-CM | POA: Diagnosis not present

## 2022-04-29 DIAGNOSIS — N2581 Secondary hyperparathyroidism of renal origin: Secondary | ICD-10-CM | POA: Diagnosis present

## 2022-04-29 DIAGNOSIS — Z4901 Encounter for fitting and adjustment of extracorporeal dialysis catheter: Secondary | ICD-10-CM | POA: Diagnosis not present

## 2022-04-29 DIAGNOSIS — E871 Hypo-osmolality and hyponatremia: Secondary | ICD-10-CM | POA: Diagnosis not present

## 2022-04-29 DIAGNOSIS — D631 Anemia in chronic kidney disease: Secondary | ICD-10-CM | POA: Diagnosis not present

## 2022-04-29 DIAGNOSIS — H409 Unspecified glaucoma: Secondary | ICD-10-CM | POA: Diagnosis not present

## 2022-04-29 DIAGNOSIS — I32 Pericarditis in diseases classified elsewhere: Secondary | ICD-10-CM | POA: Diagnosis not present

## 2022-04-29 DIAGNOSIS — E8779 Other fluid overload: Secondary | ICD-10-CM | POA: Diagnosis not present

## 2022-04-29 DIAGNOSIS — E1122 Type 2 diabetes mellitus with diabetic chronic kidney disease: Secondary | ICD-10-CM | POA: Diagnosis present

## 2022-04-29 DIAGNOSIS — Z7901 Long term (current) use of anticoagulants: Secondary | ICD-10-CM | POA: Diagnosis not present

## 2022-04-29 DIAGNOSIS — I120 Hypertensive chronic kidney disease with stage 5 chronic kidney disease or end stage renal disease: Secondary | ICD-10-CM | POA: Diagnosis present

## 2022-04-29 DIAGNOSIS — I770 Arteriovenous fistula, acquired: Secondary | ICD-10-CM | POA: Insufficient documentation

## 2022-04-29 DIAGNOSIS — I3139 Other pericardial effusion (noninflammatory): Secondary | ICD-10-CM | POA: Diagnosis not present

## 2022-04-29 DIAGNOSIS — E8770 Fluid overload, unspecified: Principal | ICD-10-CM | POA: Diagnosis present

## 2022-04-29 DIAGNOSIS — I4891 Unspecified atrial fibrillation: Secondary | ICD-10-CM | POA: Diagnosis not present

## 2022-04-29 DIAGNOSIS — I480 Paroxysmal atrial fibrillation: Secondary | ICD-10-CM | POA: Diagnosis present

## 2022-04-29 DIAGNOSIS — I10 Essential (primary) hypertension: Secondary | ICD-10-CM | POA: Diagnosis not present

## 2022-04-29 DIAGNOSIS — Z91013 Allergy to seafood: Secondary | ICD-10-CM

## 2022-04-29 DIAGNOSIS — I7122 Aneurysm of the aortic arch, without rupture: Secondary | ICD-10-CM | POA: Insufficient documentation

## 2022-04-29 DIAGNOSIS — Z1152 Encounter for screening for COVID-19: Secondary | ICD-10-CM

## 2022-04-29 DIAGNOSIS — E872 Acidosis, unspecified: Secondary | ICD-10-CM | POA: Diagnosis present

## 2022-04-29 DIAGNOSIS — R000 Tachycardia, unspecified: Secondary | ICD-10-CM | POA: Diagnosis not present

## 2022-04-29 DIAGNOSIS — N183 Chronic kidney disease, stage 3 unspecified: Secondary | ICD-10-CM | POA: Diagnosis present

## 2022-04-29 DIAGNOSIS — Z992 Dependence on renal dialysis: Secondary | ICD-10-CM

## 2022-04-29 DIAGNOSIS — I083 Combined rheumatic disorders of mitral, aortic and tricuspid valves: Secondary | ICD-10-CM | POA: Diagnosis not present

## 2022-04-29 DIAGNOSIS — D638 Anemia in other chronic diseases classified elsewhere: Secondary | ICD-10-CM | POA: Diagnosis not present

## 2022-04-29 DIAGNOSIS — I469 Cardiac arrest, cause unspecified: Secondary | ICD-10-CM | POA: Diagnosis not present

## 2022-04-29 DIAGNOSIS — E890 Postprocedural hypothyroidism: Secondary | ICD-10-CM | POA: Diagnosis present

## 2022-04-29 DIAGNOSIS — Z803 Family history of malignant neoplasm of breast: Secondary | ICD-10-CM

## 2022-04-29 DIAGNOSIS — E119 Type 2 diabetes mellitus without complications: Secondary | ICD-10-CM

## 2022-04-29 DIAGNOSIS — R630 Anorexia: Secondary | ICD-10-CM | POA: Diagnosis present

## 2022-04-29 DIAGNOSIS — E7800 Pure hypercholesterolemia, unspecified: Secondary | ICD-10-CM | POA: Diagnosis not present

## 2022-04-29 DIAGNOSIS — E785 Hyperlipidemia, unspecified: Secondary | ICD-10-CM | POA: Diagnosis not present

## 2022-04-29 DIAGNOSIS — R531 Weakness: Secondary | ICD-10-CM | POA: Diagnosis not present

## 2022-04-29 DIAGNOSIS — N186 End stage renal disease: Secondary | ICD-10-CM | POA: Diagnosis present

## 2022-04-29 DIAGNOSIS — J90 Pleural effusion, not elsewhere classified: Secondary | ICD-10-CM | POA: Diagnosis not present

## 2022-04-29 LAB — I-STAT CHEM 8, ED
BUN: >130 mg/dL — ABNORMAL HIGH (ref 8–23)
Calcium, Ion: 0.86 mmol/L — CL (ref 1.15–1.40)
Chloride: 106 mmol/L (ref 98–111)
Creatinine, Ser: >18 mg/dL — ABNORMAL HIGH (ref 0.44–1.00)
Glucose, Bld: 181 mg/dL — ABNORMAL HIGH (ref 70–99)
HCT: 29 % — ABNORMAL LOW (ref 36.0–46.0)
Hemoglobin: 9.9 g/dL — ABNORMAL LOW (ref 12.0–15.0)
Potassium: 5 mmol/L (ref 3.5–5.1)
Sodium: 132 mmol/L — ABNORMAL LOW (ref 135–145)
TCO2: 10 mmol/L — ABNORMAL LOW (ref 22–32)

## 2022-04-29 LAB — CBG MONITORING, ED: Glucose-Capillary: 127 mg/dL — ABNORMAL HIGH (ref 70–99)

## 2022-04-29 LAB — CBC WITH DIFFERENTIAL/PLATELET
Abs Immature Granulocytes: 0.04 10*3/uL (ref 0.00–0.07)
Basophils Absolute: 0 10*3/uL (ref 0.0–0.1)
Basophils Relative: 0 %
Eosinophils Absolute: 0 10*3/uL (ref 0.0–0.5)
Eosinophils Relative: 1 %
HCT: 28.4 % — ABNORMAL LOW (ref 36.0–46.0)
Hemoglobin: 9.1 g/dL — ABNORMAL LOW (ref 12.0–15.0)
Immature Granulocytes: 1 %
Lymphocytes Relative: 8 %
Lymphs Abs: 0.6 10*3/uL — ABNORMAL LOW (ref 0.7–4.0)
MCH: 27 pg (ref 26.0–34.0)
MCHC: 32 g/dL (ref 30.0–36.0)
MCV: 84.3 fL (ref 80.0–100.0)
Monocytes Absolute: 0.7 10*3/uL (ref 0.1–1.0)
Monocytes Relative: 11 %
Neutro Abs: 5.3 10*3/uL (ref 1.7–7.7)
Neutrophils Relative %: 79 %
Platelets: 331 10*3/uL (ref 150–400)
RBC: 3.37 MIL/uL — ABNORMAL LOW (ref 3.87–5.11)
RDW: 18.8 % — ABNORMAL HIGH (ref 11.5–15.5)
WBC: 6.7 10*3/uL (ref 4.0–10.5)
nRBC: 1.1 % — ABNORMAL HIGH (ref 0.0–0.2)

## 2022-04-29 LAB — BRAIN NATRIURETIC PEPTIDE: B Natriuretic Peptide: 496.4 pg/mL — ABNORMAL HIGH (ref 0.0–100.0)

## 2022-04-29 LAB — COMPREHENSIVE METABOLIC PANEL
ALT: 26 U/L (ref 0–44)
AST: 10 U/L — ABNORMAL LOW (ref 15–41)
Albumin: 2.8 g/dL — ABNORMAL LOW (ref 3.5–5.0)
Alkaline Phosphatase: 74 U/L (ref 38–126)
Anion gap: 22 — ABNORMAL HIGH (ref 5–15)
BUN: 203 mg/dL — ABNORMAL HIGH (ref 8–23)
CO2: 9 mmol/L — ABNORMAL LOW (ref 22–32)
Calcium: 7 mg/dL — ABNORMAL LOW (ref 8.9–10.3)
Chloride: 103 mmol/L (ref 98–111)
Creatinine, Ser: 17.63 mg/dL — ABNORMAL HIGH (ref 0.44–1.00)
GFR, Estimated: 2 mL/min — ABNORMAL LOW (ref >60)
Glucose, Bld: 186 mg/dL — ABNORMAL HIGH (ref 70–99)
Potassium: 5.1 mmol/L (ref 3.5–5.1)
Sodium: 134 mmol/L — ABNORMAL LOW (ref 135–145)
Total Bilirubin: 0.7 mg/dL (ref 0.3–1.2)
Total Protein: 5.6 g/dL — ABNORMAL LOW (ref 6.5–8.1)

## 2022-04-29 LAB — SARS CORONAVIRUS 2 BY RT PCR: SARS Coronavirus 2 by RT PCR: NEGATIVE

## 2022-04-29 LAB — MAGNESIUM: Magnesium: 2.7 mg/dL — ABNORMAL HIGH (ref 1.7–2.4)

## 2022-04-29 MED ORDER — SODIUM BICARBONATE 650 MG PO TABS
1300.0000 mg | ORAL_TABLET | Freq: Three times a day (TID) | ORAL | Status: DC
  Administered 2022-04-29 – 2022-05-05 (×15): 1300 mg via ORAL
  Filled 2022-04-29 (×15): qty 2

## 2022-04-29 MED ORDER — INSULIN ASPART 100 UNIT/ML IJ SOLN
0.0000 [IU] | INTRAMUSCULAR | Status: DC
  Administered 2022-05-01 – 2022-05-02 (×2): 1 [IU] via SUBCUTANEOUS
  Administered 2022-05-03: 2 [IU] via SUBCUTANEOUS

## 2022-04-29 MED ORDER — CHLORHEXIDINE GLUCONATE CLOTH 2 % EX PADS
6.0000 | MEDICATED_PAD | Freq: Every day | CUTANEOUS | Status: DC
  Administered 2022-05-01: 6 via TOPICAL

## 2022-04-29 NOTE — Progress Notes (Signed)
PCP - Dr Dorothyann Peng Cardiologist - n/a  Chest x-ray - 04/21/22 (2V) EKG - 04/21/22 Stress Test - 10/30/11 ECHO - n/a Cardiac Cath - n/a  ICD Pacemaker/Loop - n/a  Sleep Study -  n/a CPAP - none  Diabetes Type 2, diet controlled, no meds.  Patient does not check her blood sugar.  NPO  Anesthesia review: Yes  STOP now taking any Aspirin (unless otherwise instructed by your surgeon), Aleve, Naproxen, Ibuprofen, Motrin, Advil, Goody's, BC's, all herbal medications, fish oil, and all vitamins.   Coronavirus Screening Do you have any of the following symptoms:  Cough yes/no: No Fever (>100.16F)  yes/no: No Runny nose yes/no: No Sore throat yes/no: No Difficulty breathing/shortness of breath  Yes  Have you traveled in the last 14 days and where? yes/no: No  Patient verbalized understanding of instructions that were given via phone.

## 2022-04-29 NOTE — Assessment & Plan Note (Signed)
Now will require hemodialysis n.p.o. postmidnight will need TDC placement by IR.  Initiation of hemodialysis tomorrow probably will need vascular surgery consult to start AV access Phosphate is significantly elevated defer to nephrology check PTH also noted to have metabolic acidosis will need to start oral bicarb

## 2022-04-29 NOTE — Assessment & Plan Note (Signed)
Chronic stable continue to monitor likely will need to follow-up with nephrology

## 2022-04-29 NOTE — ED Triage Notes (Signed)
The pt was sent from her doctors office she reports that she is in kidney failure and needs dialysis

## 2022-04-29 NOTE — Consult Note (Signed)
[facility]  INPATIENT CONSULTATION  Reason for Consultation: uremic symptoms, need for dialysis Requesting Provider: Dr. Anitra Lauth  HPI: TAWAN CORKERN is an 72 y.o. female with CKD 5, DM, HL, anemia related to CKD currently presenting for anorexia and weakness and nephrology is consulted for evaluation and management.   Pt baseline CKD 5 followed by Dr. Kathrene Bongo and making prep for HD - had consult with vascular surgery with plans for AV access in near future.  She's been feeling really fatigued recently and developed anorexia about 1 week ago.  She was evaluated in the ED 4/16 for CP with unrevealing w/u and discharged home.  At that time Bicarb 10, BUN 160, Cr 12.    She returns today at the urging of her nephrologist to start dialysis. She admits to fatigue, anorexia x 1 week but denies confusion, hiccups, pruritus.  She thinks she has mild edema.    PMH: Past Medical History:  Diagnosis Date   Anemia    IDA   Arthritis    Cataracts, bilateral    MD just watching, no surgery as of 04/29/22   Cervical disc disease    Chronic kidney disease    Dr. Antoine Poche yearly for CKD stage II as of 11/26/11   Diabetes mellitus    type 2 - diet controlled, no meds   Dyspnea    with exertion   Eczema    Glaucoma    bilateral   Heart murmur    never has caused any problems per patient on 04/29/22   History of chicken pox    Hyperlipidemia    Hypertension    Seasonal allergies    Thyroid nodule    PSH: Past Surgical History:  Procedure Laterality Date   CESAREAN SECTION  01/06/1976   COLONOSCOPY     ORIF ANKLE FRACTURE  11/05/2011   Procedure: Left OPEN REDUCTION INTERNAL FIXATION (ORIF) ANKLE FRACTURE;  Surgeon: Toni Arthurs, MD;  Location: [facility];  Service: Orthopedics;  Laterality: Left;  OPEN REDUCTION INTERNAL FIXATION LEFT LATERAL MALLEOLUS FRACTURE WITH STRESS XRAYS WITH FLOUROSCOPY   ORIF ELBOW FRACTURE  03/05/2011   Procedure: Left OPEN  REDUCTION INTERNAL FIXATION (ORIF) ELBOW/OLECRANON FRACTURE;  Surgeon: Sharma Covert, MD;  Location: [facility] OR;  Service: Orthopedics;  Laterality: Left;   THYROIDECTOMY Right 07/20/2012   Procedure: RIGHT THYROID LOBECTOMY WITH ISTHMUSECTOMY;  Surgeon: Serena Colonel, MD;  Location: [facility] OR;  Service: ENT;  Laterality: Right;   TONSILLECTOMY     TUBAL LIGATION     UPPER GI ENDOSCOPY      Past Medical History:  Diagnosis Date   Anemia    IDA   Arthritis    Cataracts, bilateral    MD just watching, no surgery as of 04/29/22   Cervical disc disease    Chronic kidney disease    Dr. Antoine Poche yearly for CKD stage II as of 11/26/11   Diabetes mellitus    type 2 - diet controlled, no meds   Dyspnea    with exertion   Eczema    Glaucoma    bilateral   Heart murmur    never has caused any problems per patient on 04/29/22   History of chicken pox    Hyperlipidemia    Hypertension    Seasonal allergies    Thyroid nodule     Medications:  I have reviewed the patient's current medications.  (Not in a hospital admission)   ALLERGIES:   Allergies  Allergen  Reactions   Shellfish Allergy Itching    FAM HX: Family History  Problem Relation Age of Onset   Hypertension Mother    Early death Father    Breast cancer Sister    Healthy Brother     Social History:   reports that she has never smoked. She has never used smokeless tobacco. She reports that she does not drink alcohol and does not use drugs.  ROS: 12 system ROS neg except per HPI above  Blood pressure (!) 147/72, pulse 62, temperature 97.6 F (36.4 C), temperature source Oral, resp. rate 16, height  (1.626 m), weight 67.1 kg, SpO2 98 %. PHYSICAL EXAM: Gen: lying in bed flat comfortably  Eyes: anicteric ENT: MMM Neck: supple, no JVD CV:  RRR, no rub Abd:  soft, nontender Lungs: clear with normal WOB GU: no foley Extr:  trace ankle edema Neuro: AOx3, conversant, no asterixis   Results for orders placed or  performed during the hospital encounter of 04/29/22 (from the past 48 hour(s))  Brain natriuretic peptide     Status: Abnormal   Collection Time: 04/29/22  6:43 PM  Result Value Ref Range   B Natriuretic Peptide 496.4 (H) 0.0 - 100.0 pg/mL    Comment: Performed at [facility], [address], [place], [place] [address]  CBC with Differential/Platelet     Status: Abnormal   Collection Time: 04/29/22  6:43 PM  Result Value Ref Range   WBC 6.7 4.0 - 10.5 K/uL   RBC 3.37 (L) 3.87 - 5.11 MIL/uL   Hemoglobin 9.1 (L) 12.0 - 15.0 g/dL   HCT 84.6 (L) 96.2 - 95.2 %   MCV 84.3 80.0 - 100.0 fL   MCH 27.0 26.0 - 34.0 pg   MCHC 32.0 30.0 - 36.0 g/dL   RDW 84.1 (H) 32.4 - 40.1 %   Platelets 331 150 - 400 K/uL   nRBC 1.1 (H) 0.0 - 0.2 %   Neutrophils Relative % 79 %   Neutro Abs 5.3 1.7 - 7.7 K/uL   Lymphocytes Relative 8 %   Lymphs Abs 0.6 (L) 0.7 - 4.0 K/uL   Monocytes Relative 11 %   Monocytes Absolute 0.7 0.1 - 1.0 K/uL   Eosinophils Relative 1 %   Eosinophils Absolute 0.0 0.0 - 0.5 K/uL   Basophils Relative 0 %   Basophils Absolute 0.0 0.0 - 0.1 K/uL   Immature Granulocytes 1 %   Abs Immature Granulocytes 0.04 0.00 - 0.07 K/uL    Comment: Performed at [facility], [address], [place], [place] [address]  Comprehensive metabolic panel     Status: Abnormal   Collection Time: 04/29/22  6:43 PM  Result Value Ref Range   Sodium 134 (L) 135 - 145 mmol/L   Potassium 5.1 3.5 - 5.1 mmol/L   Chloride 103 98 - 111 mmol/L   CO2 9 (L) 22 - 32 mmol/L   Glucose, Bld 186 (H) 70 - 99 mg/dL    Comment: Glucose reference range applies only to samples taken after fasting for at least 8 hours.   BUN 203 (H) 8 - 23 mg/dL   Creatinine, Ser 36.64 (H) 0.44 - 1.00 mg/dL   Calcium 7.0 (L) 8.9 - 10.3 mg/dL   Total Protein 5.6 (L) 6.5 - 8.1 g/dL   Albumin 2.8 (L) 3.5 - 5.0 g/dL   AST 10 (L) 15 - 41 U/L   ALT 26 0 - 44 U/L   Alkaline Phosphatase 74  38 - 126 U/L   Total Bilirubin 0.7 0.3 -  1.2 mg/dL   GFR, Estimated 2 (L) >60 mL/min    Comment: (NOTE) Calculated using the CKD-EPI Creatinine Equation (2021)    Anion gap 22 (H) 5 - 15    Comment: ELECTROLYTES REPEATED TO VERIFY Performed at [facility], [address] [place], [place] [address]   Magnesium     Status: Abnormal   Collection Time: 04/29/22  6:43 PM  Result Value Ref Range   Magnesium 2.7 (H) 1.7 - 2.4 mg/dL    Comment: Performed at [facility], [address], [place], [place] [address]  I-stat chem 8, ED (not at [facility], [facility] or [facility])     Status: Abnormal   Collection Time: 04/29/22  6:57 PM  Result Value Ref Range   Sodium 132 (L) 135 - 145 mmol/L   Potassium 5.0 3.5 - 5.1 mmol/L   Chloride 106 98 - 111 mmol/L   BUN >130 (H) 8 - 23 mg/dL   Creatinine, Ser >09.81 (H) 0.44 - 1.00 mg/dL   Glucose, Bld 191 (H) 70 - 99 mg/dL    Comment: Glucose reference range applies only to samples taken after fasting for at least 8 hours.   Calcium, Ion 0.86 (LL) 1.15 - 1.40 mmol/L   TCO2 10 (L) 22 - 32 mmol/L   Hemoglobin 9.9 (L) 12.0 - 15.0 g/dL   HCT 47.8 (L) 29.5 - 62.1 %   Comment NOTIFIED PHYSICIAN     DG Chest 1 View  Result Date: 04/29/2022 CLINICAL DATA:  Shortness of breath EXAM: CHEST  1 VIEW COMPARISON:  04/21/2022 FINDINGS: Transverse diameter of heart is increased. Central pulmonary vessels are prominent. There are no signs of alveolar pulmonary edema. Small bilateral pleural effusions are seen, more so on the left side with interval increase. Evaluation of lower lung fields for infiltrates is limited by pleural effusions. There is no pneumothorax. IMPRESSION: Cardiomegaly. Central pulmonary vessels are prominent without signs of alveolar pulmonary edema. Small bilateral pleural effusions, more so on the left side with interval increase. Electronically Signed   By: Ernie Avena M.D.   On: 04/29/2022 19:17    Assessment/Plan **CKD 5 now ESRD:  new start HD.  NPO p MN for TDC placement in  IR, consult placed followed by HD#1.  CLIP tomorrow.  Consider consult VVS for AV access while admitted.    **Anemia of CKD/ESRD:  has been having q2wk ESA outpt -- next would be due 05/04/22.  Hb 9.1 on presentation today.   **BMM:  check phos, PTH.  Not on any outpt meds at this point.   **HTN: cont home meds.  Currently normotensive.  **Metabolic acidosis:  Oral bicarb until up to full dialysis.   **Hyponatremia:  mild, related to CKD free water handling + mild hypervolemia.  Will correct with HD.    **hypocalcemia:  supplementing.   Will follow, please reach out with concerns.   Tyler Pita 04/29/2022, 9:48 PM

## 2022-04-29 NOTE — ED Provider Triage Note (Signed)
Emergency Medicine Provider Triage Evaluation Note  Michelle Cummings , a 72 y.o. female  was evaluated in triage.  Pt complains of concern for needing dialysis.  She states she had a follow-up from previous ED visit with her nephrologist.  She states her nephrologist wanted her to come to the emergency department to initiate dialysis.  Currently does not have access.  Does have peripheral edema.  No other new complaints..  Review of Systems  Positive: As above Negative: As above  Physical Exam  BP 136/60   Pulse 65   Temp (!) 97.4 F (36.3 C) (Oral)   Resp 16   Ht  (1.626 m)   Wt 67.1 kg   SpO2 96%   BMI 25.39 kg/m  Gen:   Awake, no distress   Resp:  Normal effort  MSK:   Moves extremities without difficulty  Other:    Medical Decision Making  Medically screening exam initiated at 6:36 PM.  Appropriate orders placed.  [name] was informed that the remainder of the evaluation will be completed by another provider, this initial triage assessment does not replace that evaluation, and the importance of remaining in the ED until their evaluation is complete.    Marita Kansas, PA-C 04/29/22 [phone]

## 2022-04-29 NOTE — Anesthesia Preprocedure Evaluation (Signed)
Anesthesia Evaluation  Patient identified by MRN, date of birth, ID band  Reviewed: Allergy & Precautions, NPO status , Patient's Chart, lab work & pertinent test results, reviewed documented beta blocker date and time   History of Anesthesia Complications Negative for: history of anesthetic complications  Airway Mallampati: II  TM Distance: >3 FB Neck ROM: Full    Dental  (+) Missing,    Pulmonary    Pulmonary exam normal        Cardiovascular hypertension, Pt. on medications and Pt. on home beta blockers Normal cardiovascular exam     Neuro/Psych negative neurological ROS     GI/Hepatic negative GI ROS, Neg liver ROS,,,  Endo/Other  diabetes, Type 2    Renal/GU CRF and ESRFRenal disease (Cr 17.5, BUN 209, K 5.2)  negative genitourinary   Musculoskeletal  (+) Arthritis ,    Abdominal   Peds  Hematology  (+) Blood dyscrasia (Hgb 9.9), anemia   Anesthesia Other Findings glaucoma  Reproductive/Obstetrics negative OB ROS                             Anesthesia Physical Anesthesia Plan  ASA: 4  Anesthesia Plan: General   Post-op Pain Management: Tylenol PO (pre-op)*   Induction: Intravenous  PONV Risk Score and Plan: 3 and Treatment may vary due to age or medical condition, Ondansetron and Dexamethasone  Airway Management Planned: LMA  Additional Equipment: None  Intra-op Plan:   Post-operative Plan: Extubation in OR  Informed Consent: I have reviewed the patients History and Physical, chart, labs and discussed the procedure including the risks, benefits and alternatives for the proposed anesthesia with the patient or authorized representative who has indicated his/her understanding and acceptance.     Dental advisory given  Plan Discussed with: CRNA  Anesthesia Plan Comments:         Anesthesia Quick Evaluation

## 2022-04-29 NOTE — Subjective & Objective (Signed)
Patient presented with anorexia and fatigue She has a known history of CKD stage V presented with creatinine up to 17 She presented to the ER because her nephrologist urged her to start dialysis patient denies confusion or pruritus she does have a bit of edema seen in the emergency department by nephrology plan to start HD tomorrow IR to place catheter

## 2022-04-29 NOTE — Assessment & Plan Note (Signed)
-   Continue home meds °

## 2022-04-29 NOTE — Assessment & Plan Note (Signed)
Will do sliding scale and monitor blood sugars

## 2022-04-29 NOTE — ED Provider Notes (Signed)
[place] EMERGENCY DEPARTMENT AT [facility] Provider Note   CSN: [id number] Arrival date & time: 04/29/22  1758     History  Chief Complaint  Patient presents with   needs dialysis    Michelle Cummings is a 72 y.o. female.  Patient is a 72 year old female with a history of hypertension, hyperlipidemia, diabetes, chronic kidney disease and anemia who is presenting to the emergency room today because she followed up with Dr. Kathrene Bongo today and had blood work done which showed that she was in kidney failure and she needed to come to the hospital to receive dialysis.  Patient reports over the last month she has had more fatigue and exertional dyspnea.  She denies any dyspnea at rest.  She has had no recent medication changes.  She is also noted swelling in her legs.  She has not had any chest pain, abdominal pain or vomiting.  The history is provided by the patient.       Home Medications Prior to Admission medications   Medication Sig Start Date End Date Taking? Authorizing Provider  ACCU-CHEK GUIDE test strip USE AS DIRECTED TO CHECK BLOOD SUGARS 2 TIMES A DAY DX: E11.22 02/01/20   Dorothyann Peng, MD  Accu-Chek Softclix Lancets lancets USE AS DIRECTED TO CHECK BLOOD SUGARS 2 TIMES A DAY DX: E11.22 02/01/20   Dorothyann Peng, MD  albuterol (VENTOLIN HFA) 108 (90 Base) MCG/ACT inhaler Inhale 2 puffs into the lungs every 6 (six) hours as needed for wheezing or shortness of breath. 04/21/22   [name], FNP  amLODipine (NORVASC) 5 MG tablet Take 1 tablet by mouth daily. 01/18/22   [provider]  atorvastatin (LIPITOR) 40 MG tablet Take one tablet by mouth daily on Monday through Friday. Patient not taking: Reported on 04/28/2022 02/10/22   Dorothyann Peng, MD  benzonatate (TESSALON) 100 MG capsule Take 1 capsule (100 mg total) by mouth every 8 (eight) hours. Patient not taking: Reported on 04/28/2022 04/04/22   Garrison, [place] N, FNP  carvedilol (COREG) 25 MG tablet  Take 1 tablet (25 mg total) by mouth 2 (two) times daily. Patient taking differently: Take 12.5 mg by mouth 2 (two) times daily. 03/03/22   Dorothyann Peng, MD  diclofenac Sodium (VOLTAREN) 1 % GEL Apply 2 g topically 4 (four) times daily. Patient not taking: Reported on 04/28/2022 09/18/21   [name], FNP  fluticasone [facility]) 50 MCG/ACT nasal spray Place 2 sprays into both nostrils daily. Patient not taking: Reported on 04/28/2022 01/27/22   [name], FNP  hydrALAZINE (APRESOLINE) 25 MG tablet Take 25 mg by mouth daily. 11/04/21   [provider]  latanoprost (XALATAN) 0.005 % ophthalmic solution Place 1 drop into both eyes at bedtime. 09/01/16   [provider]  timolol (TIMOPTIC-XR) 0.5 % ophthalmic gel-forming Place 1 drop into both eyes in the morning.    [provider]  vitamin C (ASCORBIC ACID) 500 MG tablet Take 1,000 mg by mouth daily.    [provider]  Vitamin D, Ergocalciferol, (DRISDOL) 1.25 MG (50000 UNIT) CAPS capsule TAKE 1 CAPSULE BY MOUTH EVERY 7 DAYS 03/19/22   Dorothyann Peng, MD      Allergies    Shellfish allergy    Review of Systems   Review of Systems  Physical Exam Updated Vital Signs BP (!) 147/72   Pulse 62   Temp 97.6 F (36.4 C) (Oral)   Resp 16   Ht  (1.626 m)   Wt 67.1  kg   SpO2 98%   BMI 25.39 kg/m  Physical Exam Vitals and nursing note reviewed.  Constitutional:      General: She is not in acute distress.    Appearance: She is well-developed.  HENT:     Head: Normocephalic and atraumatic.  Eyes:     Pupils: Pupils are equal, round, and reactive to light.  Cardiovascular:     Rate and Rhythm: Normal rate and regular rhythm.     Pulses: Normal pulses.     Heart sounds: Normal heart sounds. No murmur heard.    No friction rub.  Pulmonary:     Effort: Pulmonary effort is normal.     Breath sounds: Examination of the left-lower field reveals decreased breath sounds. Decreased breath sounds  present. No wheezing or rales.  Abdominal:     General: Bowel sounds are normal. There is no distension.     Palpations: Abdomen is soft.     Tenderness: There is no abdominal tenderness. There is no guarding or rebound.  Musculoskeletal:        General: No tenderness. Normal range of motion.     Right lower leg: Edema present.     Left lower leg: Edema present.     Comments: 1+ pitting edema bilaterally  Skin:    General: Skin is warm and dry.     Findings: No rash.  Neurological:     Mental Status: She is alert and oriented to person, place, and time. Mental status is at baseline.     Cranial Nerves: No cranial nerve deficit.  Psychiatric:        Mood and Affect: Mood normal.        Behavior: Behavior normal.     ED Results / Procedures / Treatments   Labs (all labs ordered are listed, but only abnormal results are displayed) Labs Reviewed  BRAIN NATRIURETIC PEPTIDE - Abnormal; Notable for the following components:      Result Value   B Natriuretic Peptide 496.4 (*)    All other components within normal limits  CBC WITH DIFFERENTIAL/PLATELET - Abnormal; Notable for the following components:   RBC 3.37 (*)    Hemoglobin 9.1 (*)    HCT 28.4 (*)    RDW 18.8 (*)    nRBC 1.1 (*)    Lymphs Abs 0.6 (*)    All other components within normal limits  COMPREHENSIVE METABOLIC PANEL - Abnormal; Notable for the following components:   Sodium 134 (*)    CO2 9 (*)    Glucose, Bld 186 (*)    BUN 203 (*)    Creatinine, Ser 17.63 (*)    Calcium 7.0 (*)    Total Protein 5.6 (*)    Albumin 2.8 (*)    AST 10 (*)    GFR, Estimated 2 (*)    Anion gap 22 (*)    All other components within normal limits  MAGNESIUM - Abnormal; Notable for the following components:   Magnesium 2.7 (*)    All other components within normal limits  I-STAT CHEM 8, ED - Abnormal; Notable for the following components:   Sodium 132 (*)    BUN >130 (*)    Creatinine, Ser >18.00 (*)    Glucose, Bld 181 (*)     Calcium, Ion 0.86 (*)    TCO2 10 (*)    Hemoglobin 9.9 (*)    HCT 29.0 (*)    All other components within normal limits  SARS CORONAVIRUS 2  BY RT PCR    EKG None  Radiology DG Chest 1 View  Result Date: 04/29/2022 CLINICAL DATA:  Shortness of breath EXAM: CHEST  1 VIEW COMPARISON:  04/21/2022 FINDINGS: Transverse diameter of heart is increased. Central pulmonary vessels are prominent. There are no signs of alveolar pulmonary edema. Small bilateral pleural effusions are seen, more so on the left side with interval increase. Evaluation of lower lung fields for infiltrates is limited by pleural effusions. There is no pneumothorax. IMPRESSION: Cardiomegaly. Central pulmonary vessels are prominent without signs of alveolar pulmonary edema. Small bilateral pleural effusions, more so on the left side with interval increase. Electronically Signed   By: Ernie Avena M.D.   On: 04/29/2022 19:17    Procedures Procedures    Medications Ordered in ED Medications - No data to display  ED Course/ Medical Decision Making/ A&P                             Medical Decision Making Amount and/or Complexity of Data Reviewed Labs: ordered. Decision-making details documented in ED Course. ECG/medicine tests: ordered and independent interpretation performed. Decision-making details documented in ED Course.   Pt with multiple medical problems and comorbidities and presenting today with a complaint that caries a high risk for morbidity and mortality.  Here today due to worsening kidney failure.  Patient has evidence of fluid overload on exam with decreased breath sounds in the lung bases and distal edema in the legs.  She is on room air and satting 99% and denies significant shortness of breath at this time.  I independently interpreted patient's labs and EKG. EKG without acute changes today, CBC with normal Lonon count, hemoglobin of 9.1 and stable platelet count, CMP with BUN of 203, creatinine of  17.63 with an anion gap of 22 but normal potassium of 5.1.  Magnesium is also elevated 2.7.  I have independently visualized and interpreted pt's images today.  Chest x-ray with cardiomegaly and small pleural effusions.  Spoke with Dr. Glenna Fellows with the nephrology service for consult and they will come see the patient.  Consulted the hospitalist service for admission.  Patient is comfortable with this plan.  She is currently stable at this time.          Final Clinical Impression(s) / ED Diagnoses Final diagnoses:  Stage 5 chronic kidney disease not on chronic dialysis  Other hypervolemia    Rx / DC Orders ED Discharge Orders     None         Gwyneth Sprout, MD 04/29/22 2146

## 2022-04-29 NOTE — Assessment & Plan Note (Addendum)
Continue Norvasc 5 mg a day Coreg 12.5 mg a day   She was told to stop hydralazine

## 2022-04-29 NOTE — Assessment & Plan Note (Signed)
Plan to start hemodialysis tomorrow appreciate nephrology consult

## 2022-04-29 NOTE — Assessment & Plan Note (Addendum)
Pt stopped Lipitor due to myalgia

## 2022-04-29 NOTE — H&P (Signed)
Michelle Cummings HYQ:[id number] DOB: Jul 08, 1950 DOA: 04/29/2022     PCP: Dorothyann Peng, MD   Outpatient Specialists:      NEphrology:   Dr. Janice Norrie, MD    Patient arrived to ER on 04/29/22 at 1758 Referred by Attending Gwyneth Sprout, MD   Patient coming from:    home Lives  With family    Chief Complaint:   Chief Complaint  Patient presents with   needs dialysis    HPI: Michelle Cummings is a 72 y.o. female with medical history significant of CKD stage 5, anemia of chronic disease, DM 2, glaucoma, HLD, HTN    Presented with   nephrologist sent to start dialysis Patient presented with anorexia and fatigue She has a known history of CKD stage V presented with creatinine up to 17 She presented to the ER because her nephrologist urged her to start dialysis patient denies confusion or pruritus she does have a bit of edema seen in the emergency department by nephrology plan to start HD tomorrow IR to place catheter    No CP no SOB She was having Cp last week but was told it was more related to her kidneys her cr was up to 12 she was finally able to see her nephrologist ans was sent to ER   Denies significant ETOH intake   Does not smoke   Lab Results  Component Value Date   SARSCOV2NAA NEGATIVE 04/29/2022   SARSCOV2NAA NEGATIVE 04/21/2022    Regarding pertinent Chronic problems:     Hyperlipidemia - on statins Lipitor (atorvastatin)  Lipid Panel     Component Value Date/Time   CHOL 243 (H) 11/04/2021 1001   TRIG 73 11/04/2021 1001   HDL 82 11/04/2021 1001   CHOLHDL 3.0 11/04/2021 1001   CHOLHDL 4 06/02/2016 0833   VLDL 30.6 06/02/2016 0833   LDLCALC 149 (H) 11/04/2021 1001   LABVLDL 12 11/04/2021 1001     HTN on Norvasc Coreg hydralazine      DM 2 -  Lab Results  Component Value Date   HGBA1C 6.0 (H) 02/10/2022   diet controlled      CKD stage v- baseline Cr 12 CrCl cannot be calculated (This lab value cannot be used to  calculate CrCl because it is not a number: >18.00).  Lab Results  Component Value Date   CREATININE >18.00 (H) 04/29/2022   CREATININE 17.63 (H) 04/29/2022   CREATININE 12.02 (H) 04/21/2022     Chronic anemia - baseline hg Hemoglobin & Hematocrit  Recent Labs    04/21/22 1617 04/29/22 1843 04/29/22 1857  HGB 9.6* 9.1* 9.9*   Iron/TIBC/Ferritin/ %Sat    Component Value Date/Time   IRON 62 04/20/2022 0940   IRON 70 01/15/2022 0000   TIBC 227 (L) 04/20/2022 0940   TIBC 227 01/15/2022 0000   FERRITIN 514 (H) 04/20/2022 0940   FERRITIN 434 01/15/2022 0000   IRONPCTSAT 27 04/20/2022 0940   IRONPCTSAT 31 01/15/2022 0000      While in ER:     Nephrology was consulted plan to start hemodialysis tomorrow    Lab Orders         SARS Coronavirus 2 by RT PCR (hospital order, performed in [facility] hospital lab) *cepheid single result test* Anterior Nasal Swab         Brain natriuretic peptide         CBC with Differential/Platelet  Comprehensive metabolic panel         Magnesium         Hepatitis B surface antigen         Hepatitis B surface antibody,quantitative         Renal function panel         CBC         PTH, intact and calcium         I-stat chem 8, ED (not at [facility], [facility] or [facility])       CXR - Cardiomegaly. Central pulmonary vessels are prominent without signs of alveolar pulmonary edema. Small bilateral pleural effusions, more so on the left side with interval increase.     Following Medications were ordered in ER: Medications  Chlorhexidine Gluconate Cloth 2 % PADS 6 each (has no administration in time range)    _______________________________________________________ ER Provider Called: Nephrology   Dr. Glenna Fellows  They Recommend admit to medicine    SEEN in ER   ED Triage Vitals  Enc Vitals Group     BP 04/29/22 1821 136/60     Pulse Rate 04/29/22 1821 65     Resp 04/29/22 1821 16     Temp 04/29/22 1821 (!) 97.4 F (36.3 C)     Temp Source 04/29/22  1821 Oral     SpO2 04/29/22 1821 96 %     Weight 04/29/22 1826 147 lb 14.9 oz (67.1 kg)     Height 04/29/22 1826 5\' 4"  (1.626 m)     Head Circumference --      Peak Flow --      Pain Score 04/29/22 1826 0     Pain Loc --      Pain Edu? --      Excl. in GC? --   TMAX(24)@     _________________________________________ Significant initial  Findings: Abnormal Labs Reviewed  BRAIN NATRIURETIC PEPTIDE - Abnormal; Notable for the following components:      Result Value   B Natriuretic Peptide 496.4 (*)    All other components within normal limits  CBC WITH DIFFERENTIAL/PLATELET - Abnormal; Notable for the following components:   RBC 3.37 (*)    Hemoglobin 9.1 (*)    HCT 28.4 (*)    RDW 18.8 (*)    nRBC 1.1 (*)    Lymphs Abs 0.6 (*)    All other components within normal limits  COMPREHENSIVE METABOLIC PANEL - Abnormal; Notable for the following components:   Sodium 134 (*)    CO2 9 (*)    Glucose, Bld 186 (*)    BUN 203 (*)    Creatinine, Ser 17.63 (*)    Calcium 7.0 (*)    Total Protein 5.6 (*)    Albumin 2.8 (*)    AST 10 (*)    GFR, Estimated 2 (*)    Anion gap 22 (*)    All other components within normal limits  MAGNESIUM - Abnormal; Notable for the following components:   Magnesium 2.7 (*)    All other components within normal limits  I-STAT CHEM 8, ED - Abnormal; Notable for the following components:   Sodium 132 (*)    BUN >130 (*)    Creatinine, Ser >18.00 (*)    Glucose, Bld 181 (*)    Calcium, Ion 0.86 (*)    TCO2 10 (*)    Hemoglobin 9.9 (*)    HCT 29.0 (*)    All other components within normal limits    ECG:  Ordered Personally reviewed and interpreted by me showing: HR : 64 Rhythm: Sinus rhythm Atrial premature complex Probable left atrial enlargement Probable anteroseptal infarct, old Borderline ST elevation, lateral leads No significant change since last tracing QTC 454  BNP (last 3 results) Recent Labs    04/21/22 2207 04/29/22 1843  BNP  1,129.4* 496.4*     COVID-19 Labs   Lab Results  Component Value Date   SARSCOV2NAA NEGATIVE 04/29/2022   SARSCOV2NAA NEGATIVE 04/21/2022     The recent clinical data is shown below. Vitals:   04/29/22 1821 04/29/22 1826 04/29/22 1941 04/29/22 1945  BP: 136/60  137/62 (!) 147/72  Pulse: 65  65 62  Resp: 16  16   Temp:   97.6 F (36.4 C)   TempSrc:   Oral   SpO2: 96%  99% 98%  Weight:  67.1 kg    Height:  5\' 4"  (1.626 m)      WBC     Component Value Date/Time   WBC 6.7 04/29/2022 1843   LYMPHSABS 0.6 (L) 04/29/2022 1843   MONOABS 0.7 04/29/2022 1843   EOSABS 0.0 04/29/2022 1843   BASOSABS 0.0 04/29/2022 1843      UA  ordered    Results for orders placed or performed during the hospital encounter of 04/29/22  SARS Coronavirus 2 by RT PCR (hospital order, performed in [facility] hospital lab) *cepheid single result test* Anterior Nasal Swab     Status: None   Collection Time: 04/29/22  6:35 PM   Specimen: Anterior Nasal Swab  Result Value Ref Range Status   SARS Coronavirus 2 by RT PCR NEGATIVE NEGATIVE Final    Comment: Performed at [facility], [address], [place], [place] [address]    ___________________________________________________ Recent Labs  Lab 04/29/22 1843 04/29/22 1857  NA 134* 132*  K 5.1 5.0  CO2 9*  --   GLUCOSE 186* 181*  BUN 203* >130*  CREATININE 17.63* >18.00*  CALCIUM 7.0*  --   MG 2.7*  --     Cr   Up from baseline see below Lab Results  Component Value Date   CREATININE >18.00 (H) 04/29/2022   CREATININE 17.63 (H) 04/29/2022   CREATININE 12.02 (H) 04/21/2022    Recent Labs  Lab 04/29/22 1843  AST 10*  ALT 26  ALKPHOS 74  BILITOT 0.7  PROT 5.6*  ALBUMIN 2.8*   Lab Results  Component Value Date   CALCIUM 7.0 (L) 04/29/2022   PHOS >30.0 (H) 04/20/2022    Plt: Lab Results  Component Value Date   PLT 331 04/29/2022       Recent Labs  Lab 04/29/22 1843 04/29/22 1857  WBC 6.7  --   NEUTROABS 5.3  --    HGB 9.1* 9.9*  HCT 28.4* 29.0*  MCV 84.3  --   PLT 331  --     HG/HCT  stable,       Component Value Date/Time   HGB 9.9 (L) 04/29/2022 1857   HGB 9.9 (L) 10/22/2020 1151   HCT 29.0 (L) 04/29/2022 1857   HCT 30.7 (L) 10/22/2020 1151   MCV 84.3 04/29/2022 1843   MCV 85 10/22/2020 1151     _______________________________________________ Hospitalist was called for admission for   Stage 5 chronic kidney disease not on chronic dialysis    hypervolemia     The following Work up has been ordered so far:  Orders Placed This Encounter  Procedures   SARS Coronavirus 2  by RT PCR (hospital order, performed in [facility] hospital lab) *cepheid single result test* Anterior Nasal Swab   DG Chest 1 View   IR Radiologist Eval & Mgmt   Brain natriuretic peptide   CBC with Differential/Platelet   Comprehensive metabolic panel   Magnesium   Hepatitis B surface antigen   Hepatitis B surface antibody,quantitative   Renal function panel   CBC   PTH, intact and calcium   Diet NPO time specified   Informed Consent Details: Physician/Practitioner Attestation; Transcribe to consent form and obtain patient signature   Pre-Hemodialysis Protocol - Day of Dialysis   Post-Dialysis Protocol - Day of Dialysis   Consult to nephrology   Consult to hospitalist   Airborne and Contact precautions   I-stat chem 8, ED (not at [facility], [facility] or [facility])   ED EKG   Hemodialysis inpatient     OTHER Significant initial  Findings:  labs showing:     DM  labs:  HbA1C: Recent Labs    11/04/21 1001 01/14/22 0000 02/10/22 1123  HGBA1C 6.0* 8.4 6.0*       CBG (last 3)  No results for input(s): "GLUCAP" in the last 72 hours.        Cultures: No results found for: "SDES", "SPECREQUEST", "CULT", "REPTSTATUS"   Radiological Exams on Admission: DG Chest 1 View  Result Date: 04/29/2022 CLINICAL DATA:  Shortness of breath EXAM: CHEST  1 VIEW COMPARISON:  04/21/2022 FINDINGS: Transverse diameter of heart  is increased. Central pulmonary vessels are prominent. There are no signs of alveolar pulmonary edema. Small bilateral pleural effusions are seen, more so on the left side with interval increase. Evaluation of lower lung fields for infiltrates is limited by pleural effusions. There is no pneumothorax. IMPRESSION: Cardiomegaly. Central pulmonary vessels are prominent without signs of alveolar pulmonary edema. Small bilateral pleural effusions, more so on the left side with interval increase. Electronically Signed   By: Ernie Avena M.D.   On: 04/29/2022 19:17   _______________________________________________________________________________________________________ Latest  Blood pressure (!) 147/72, pulse 62, temperature 97.6 F (36.4 C), temperature source Oral, resp. rate 16, height 5\' 4"  (1.626 m), weight 67.1 kg, SpO2 98 %.   Vitals  labs and radiology finding personally reviewed  Review of Systems:    Pertinent positives include:  fatigue,   Constitutional:  No weight loss, night sweats, Fevers, chills, weight loss  HEENT:  No headaches, Difficulty swallowing,Tooth/dental problems,Sore throat,  No sneezing, itching, ear ache, nasal congestion, post nasal drip,  Cardio-vascular:  No chest pain, Orthopnea, PND, anasarca, dizziness, palpitations.no Bilateral lower extremity swelling  GI:  No heartburn, indigestion, abdominal pain, nausea, vomiting, diarrhea, change in bowel habits, loss of appetite, melena, blood in stool, hematemesis Resp:  no shortness of breath at rest. No dyspnea on exertion, No excess mucus, no productive cough, No non-productive cough, No coughing up of blood.No change in color of mucus.No wheezing. Skin:  no rash or lesions. No jaundice GU:  no dysuria, change in color of urine, no urgency or frequency. No straining to urinate.  No flank pain.  Musculoskeletal:  No joint pain or no joint swelling. No decreased range of motion. No back pain.  Psych:  No  change in mood or affect. No depression or anxiety. No memory loss.  Neuro: no localizing neurological complaints, no tingling, no weakness, no double vision, no gait abnormality, no slurred speech, no confusion  All systems reviewed and apart from HOPI all are negative _______________________________________________________________________________________________ Past Medical History:  Past Medical History:  Diagnosis Date   Anemia    IDA   Arthritis    Cataracts, bilateral    MD just watching, no surgery as of 04/29/22   Cervical disc disease    Chronic kidney disease    Dr. Antoine Poche yearly for CKD stage II as of 11/26/11   Diabetes mellitus    type 2 - diet controlled, no meds   Dyspnea    with exertion   Eczema    Glaucoma    bilateral   Heart murmur    never has caused any problems per patient on 04/29/22   History of chicken pox    Hyperlipidemia    Hypertension    Seasonal allergies    Thyroid nodule     Past Surgical History:  Procedure Laterality Date   CESAREAN SECTION  01/06/1976   COLONOSCOPY     ORIF ANKLE FRACTURE  11/05/2011   Procedure: Left OPEN REDUCTION INTERNAL FIXATION (ORIF) ANKLE FRACTURE;  Surgeon: Toni Arthurs, MD;  Location: [facility];  Service: Orthopedics;  Laterality: Left;  OPEN REDUCTION INTERNAL FIXATION LEFT LATERAL MALLEOLUS FRACTURE WITH STRESS XRAYS WITH FLOUROSCOPY   ORIF ELBOW FRACTURE  03/05/2011   Procedure: Left OPEN REDUCTION INTERNAL FIXATION (ORIF) ELBOW/OLECRANON FRACTURE;  Surgeon: Sharma Covert, MD;  Location: [facility] OR;  Service: Orthopedics;  Laterality: Left;   THYROIDECTOMY Right 07/20/2012   Procedure: RIGHT THYROID LOBECTOMY WITH ISTHMUSECTOMY;  Surgeon: Serena Colonel, MD;  Location: [facility] OR;  Service: ENT;  Laterality: Right;   TONSILLECTOMY     TUBAL LIGATION     UPPER GI ENDOSCOPY      Social History:  Ambulatory   independently       reports that she has never smoked. She has never used smokeless  tobacco. She reports that she does not drink alcohol and does not use drugs.     Family History:   Family History  Problem Relation Age of Onset   Hypertension Mother    Early death Father    Breast cancer Sister    Healthy Brother    ______________________________________________________________________________________________ Allergies: Allergies  Allergen Reactions   Shellfish Allergy Itching     Prior to Admission medications   Medication Sig Start Date End Date Taking? Authorizing Provider  albuterol (VENTOLIN HFA) 108 (90 Base) MCG/ACT inhaler Inhale 2 puffs into the lungs every 6 (six) hours as needed for wheezing or shortness of breath. 04/21/22  Yes [name], FNP  amLODipine (NORVASC) 5 MG tablet Take 5 mg by mouth daily. 01/18/22  Yes [provider]  carvedilol (COREG) 25 MG tablet Take 1 tablet (25 mg total) by mouth 2 (two) times daily. Patient taking differently: Take 12.5 mg by mouth 2 (two) times daily. 03/03/22  Yes Dorothyann Peng, MD  hydrALAZINE (APRESOLINE) 25 MG tablet Take 25 mg by mouth daily. 11/04/21  Yes [provider]  latanoprost (XALATAN) 0.005 % ophthalmic solution Place 1 drop into both eyes at bedtime. 09/01/16  Yes [provider]  sodium bicarbonate 650 MG tablet Take 650 mg by mouth daily as needed for heartburn.   Yes [provider]  timolol (TIMOPTIC-XR) 0.5 % ophthalmic gel-forming Place 1 drop into both eyes in the morning.   Yes [provider]  vitamin C (ASCORBIC ACID) 500 MG tablet Take 1,000 mg by mouth daily.   Yes [provider]  Vitamin D, Ergocalciferol, (DRISDOL) 1.25 MG (50000 UNIT) CAPS capsule TAKE 1 CAPSULE BY MOUTH EVERY 7  DAYS Patient taking differently: Take 50,000 Units by mouth every 7 (seven) days. 03/19/22  Yes Dorothyann Peng, MD  ACCU-CHEK GUIDE test strip USE AS DIRECTED TO CHECK BLOOD SUGARS 2 TIMES A DAY DX: E11.22 02/01/20   Dorothyann Peng, MD  Accu-Chek Softclix  Lancets lancets USE AS DIRECTED TO CHECK BLOOD SUGARS 2 TIMES A DAY DX: E11.22 02/01/20   Dorothyann Peng, MD  atorvastatin (LIPITOR) 40 MG tablet Take one tablet by mouth daily on Monday through Friday. Patient not taking: Reported on 04/28/2022 02/10/22   Dorothyann Peng, MD  fluticasone [facility]) 50 MCG/ACT nasal spray Place 2 sprays into both nostrils daily. Patient not taking: Reported on 04/28/2022 01/27/22   [name], FNP    ___________________________________________________________________________________________________ Physical Exam:    04/29/2022    7:45 PM 04/29/2022    7:41 PM 04/29/2022    6:26 PM  Vitals with BMI  Height     Weight   147 lbs 15 oz  BMI   25.38  Systolic 147 137   Diastolic 72 62   Pulse 62 65      1. General:  in No  Acute distress    Chronically ill   -appearing 2. Psychological: Alert and   Oriented 3. Head/ENT:   Dry Mucous Membranes                          Head Non traumatic, neck supple                       Poor Dentition 4. SKIN: normal  Skin turgor,  Skin clean Dry and intact no rash    5. Heart: Regular rate and rhythm no  Murmur, no Rub or gallop 6. Lungs:  no wheezes or crackles   7. Abdomen: Soft,  non-tender, Non distended   obese  bowel sounds present 8. Lower extremities: no clubbing, cyanosis, no  edema 9. Neurologically Grossly intact, moving all 4 extremities equally   10. MSK: Normal range of motion    Chart has been reviewed  ______________________________________________________________________________________________  Assessment/Plan 72 y.o. female with medical history significant of CKD stage 5, anemia of chronic disease, DM 2, glaucoma, HLD, HTN   Admitted for Stage 5 chronic kidney disease not on chronic dialysis  Other hypervolemia     Present on Admission:  Fluid overload  HTN (hypertension)  Type 2 diabetes mellitus with stage 3 chronic kidney disease  HLD (hyperlipidemia)  Glaucoma  Anemia of chronic  disease  Chronic renal disease, stage IV    Fluid overload Plan to start hemodialysis tomorrow appreciate nephrology consult  HTN (hypertension) Continue Norvasc 5 mg a day Coreg 12.5 mg a day   She was told to stop hydralazine   Type 2 diabetes mellitus with stage 3 chronic kidney disease Will do sliding scale and monitor blood sugars  HLD (hyperlipidemia) Pt stopped Lipitor due to myalgia  Glaucoma Continue home meds  Anemia of chronic disease Chronic stable continue to monitor likely will need to follow-up with nephrology  Chronic renal disease, stage IV Now will require hemodialysis n.p.o. postmidnight will need TDC placement by IR.  Initiation of hemodialysis tomorrow probably will need vascular surgery consult to start AV access Phosphate is significantly elevated defer to nephrology check PTH also noted to have metabolic acidosis will need to start oral bicarb   Other plan as per orders.  DVT prophylaxis:  SCD  Code Status:    Code Status: Prior FULL CODE  as per patient   I had personally discussed CODE STATUS with patient  ACP none   Family Communication:   Family not at  Bedside    Diet  Diet Orders (From admission, onward)     Start     Ordered   04/30/22 0001  Diet NPO time specified  Diet effective midnight        04/29/22 2200            Disposition Plan:        To home once workup is complete and patient is stable   Following barriers for discharge:                            Electrolytes corrected                                                        Will need consultants to evaluate patient prior to discharge        Consult Orders  (From admission, onward)           Start     Ordered   04/29/22 2129  Consult to hospitalist  Paged 21:30  Once       Provider:  (Not yet assigned)  Question Answer Comment  Place call to: [facility]   Reason for Consult Admit      04/29/22 2128                               Nutrition    consulted             Consults called: nephrology is aware Treatment Team:  Tyler Pita, MD  Admission status:  ED Disposition     ED Disposition  Admit   Condition  --   Comment  Hospital Area: [facility] [[id number]]  Level of Care: Progressive [102]  Admit to Progressive based on following criteria: NEPHROLOGY stable condition requiring close monitoring for AKI, requiring Hemodialysis or Peritoneal Dialysis either from expected electrolyte imbalance, acidosis, or fluid overload that can be managed by NIPPV or high flow oxygen.  May admit patient to [facility] or [facility] if equivalent level of care is available:: No  Covid Evaluation: Asymptomatic - no recent exposure (last 10 days) testing not required  Diagnosis: Fluid overload [536644]  Admitting Physician: Therisa Doyne [[id number]]  Attending Physician: Therisa Doyne [[id number]]  Certification:: I certify this patient will need inpatient services for at least 2 midnights  Estimated Length of Stay: 2           inpatient     I Expect 2 midnight stay secondary to severity of patient's current illness need for inpatient interventions justified by the following:     Severe lab/radiological/exam abnormalities including:    AKI on CKD and extensive comorbidities including:  DM2   CKD    That are currently affecting medical management.   I expect  patient to be hospitalized for 2 midnights requiring inpatient medical care.  Patient is at high risk for adverse outcome (such as loss of life or disability) if not treated.  Indication for inpatient stay as follows:  Need for operative/procedural  intervention     Need for HD    Level of care        progressive tele indefinitely please discontinue once patient no longer qualifies COVID-19 Labs    Lab Results  Component Value Date   SARSCOV2NAA NEGATIVE 04/29/2022      Joley Utecht 04/29/2022, 11:12 PM    [facility]     after 2 AM please page floor coverage PA If 7AM-7PM, please contact the day team taking care of the patient using [email or web address]

## 2022-04-30 ENCOUNTER — Other Ambulatory Visit: Payer: Self-pay

## 2022-04-30 ENCOUNTER — Inpatient Hospital Stay (HOSPITAL_COMMUNITY): Payer: Medicare PPO | Admitting: Vascular Surgery

## 2022-04-30 ENCOUNTER — Encounter (HOSPITAL_COMMUNITY)
Admission: EM | Disposition: A | Discharge status: Home / Self Care | Payer: Self-pay | Source: Home / Self Care | Attending: Internal Medicine

## 2022-04-30 ENCOUNTER — Ambulatory Visit: Payer: Medicare PPO

## 2022-04-30 ENCOUNTER — Encounter (HOSPITAL_COMMUNITY): Payer: Self-pay | Admitting: Internal Medicine

## 2022-04-30 ENCOUNTER — Inpatient Hospital Stay (HOSPITAL_COMMUNITY): Payer: Medicare PPO

## 2022-04-30 ENCOUNTER — Ambulatory Visit (HOSPITAL_COMMUNITY): Admission: RE | Admit: 2022-04-30 | Payer: Medicare PPO | Source: Home / Self Care | Admitting: Vascular Surgery

## 2022-04-30 DIAGNOSIS — E1122 Type 2 diabetes mellitus with diabetic chronic kidney disease: Secondary | ICD-10-CM

## 2022-04-30 DIAGNOSIS — N186 End stage renal disease: Secondary | ICD-10-CM | POA: Diagnosis not present

## 2022-04-30 DIAGNOSIS — Z992 Dependence on renal dialysis: Secondary | ICD-10-CM

## 2022-04-30 DIAGNOSIS — D631 Anemia in chronic kidney disease: Secondary | ICD-10-CM | POA: Diagnosis not present

## 2022-04-30 DIAGNOSIS — E8779 Other fluid overload: Secondary | ICD-10-CM | POA: Diagnosis not present

## 2022-04-30 DIAGNOSIS — N185 Chronic kidney disease, stage 5: Secondary | ICD-10-CM | POA: Diagnosis not present

## 2022-04-30 DIAGNOSIS — N179 Acute kidney failure, unspecified: Secondary | ICD-10-CM | POA: Diagnosis not present

## 2022-04-30 DIAGNOSIS — I120 Hypertensive chronic kidney disease with stage 5 chronic kidney disease or end stage renal disease: Secondary | ICD-10-CM | POA: Diagnosis not present

## 2022-04-30 DIAGNOSIS — D638 Anemia in other chronic diseases classified elsewhere: Secondary | ICD-10-CM | POA: Diagnosis not present

## 2022-04-30 HISTORY — DX: Dyspnea, unspecified: R06.00

## 2022-04-30 HISTORY — DX: Anemia, unspecified: D64.9

## 2022-04-30 HISTORY — PX: INSERTION OF DIALYSIS CATHETER: SHX1324

## 2022-04-30 HISTORY — PX: AV FISTULA PLACEMENT: SHX1204

## 2022-04-30 LAB — RENAL FUNCTION PANEL
Albumin: 2.7 g/dL — ABNORMAL LOW (ref 3.5–5.0)
Anion gap: 22 — ABNORMAL HIGH (ref 5–15)
BUN: 208 mg/dL — ABNORMAL HIGH (ref 8–23)
CO2: 10 mmol/L — ABNORMAL LOW (ref 22–32)
Calcium: 6.9 mg/dL — ABNORMAL LOW (ref 8.9–10.3)
Chloride: 104 mmol/L (ref 98–111)
Creatinine, Ser: 17.5 mg/dL — ABNORMAL HIGH (ref 0.44–1.00)
GFR, Estimated: 2 mL/min — ABNORMAL LOW (ref >60)
Glucose, Bld: 128 mg/dL — ABNORMAL HIGH (ref 70–99)
Phosphorus: >30 mg/dL — ABNORMAL HIGH (ref 2.5–4.6)
Potassium: 5.4 mmol/L — ABNORMAL HIGH (ref 3.5–5.1)
Sodium: 136 mmol/L (ref 135–145)

## 2022-04-30 LAB — COMPREHENSIVE METABOLIC PANEL
ALT: 26 U/L (ref 0–44)
AST: 11 U/L — ABNORMAL LOW (ref 15–41)
Albumin: 2.8 g/dL — ABNORMAL LOW (ref 3.5–5.0)
Alkaline Phosphatase: 68 U/L (ref 38–126)
Anion gap: 24 — ABNORMAL HIGH (ref 5–15)
BUN: 209 mg/dL — ABNORMAL HIGH (ref 8–23)
CO2: 9 mmol/L — ABNORMAL LOW (ref 22–32)
Calcium: 7 mg/dL — ABNORMAL LOW (ref 8.9–10.3)
Chloride: 103 mmol/L (ref 98–111)
Creatinine, Ser: 17.5 mg/dL — ABNORMAL HIGH (ref 0.44–1.00)
GFR, Estimated: 2 mL/min — ABNORMAL LOW (ref >60)
Glucose, Bld: 119 mg/dL — ABNORMAL HIGH (ref 70–99)
Potassium: 5.2 mmol/L — ABNORMAL HIGH (ref 3.5–5.1)
Sodium: 136 mmol/L (ref 135–145)
Total Bilirubin: 0.6 mg/dL (ref 0.3–1.2)
Total Protein: 5.9 g/dL — ABNORMAL LOW (ref 6.5–8.1)

## 2022-04-30 LAB — GLUCOSE, CAPILLARY
Glucose-Capillary: 100 mg/dL — ABNORMAL HIGH (ref 70–99)
Glucose-Capillary: 106 mg/dL — ABNORMAL HIGH (ref 70–99)
Glucose-Capillary: 150 mg/dL — ABNORMAL HIGH (ref 70–99)
Glucose-Capillary: 160 mg/dL — ABNORMAL HIGH (ref 70–99)

## 2022-04-30 LAB — I-STAT VENOUS BLOOD GAS, ED
Acid-base deficit: 18 mmol/L — ABNORMAL HIGH (ref 0.0–2.0)
Bicarbonate: 8.3 mmol/L — ABNORMAL LOW (ref 20.0–28.0)
Calcium, Ion: 0.89 mmol/L — CL (ref 1.15–1.40)
HCT: 27 % — ABNORMAL LOW (ref 36.0–46.0)
Hemoglobin: 9.2 g/dL — ABNORMAL LOW (ref 12.0–15.0)
O2 Saturation: 91 %
Potassium: 5.2 mmol/L — ABNORMAL HIGH (ref 3.5–5.1)
Sodium: 134 mmol/L — ABNORMAL LOW (ref 135–145)
TCO2: 9 mmol/L — ABNORMAL LOW (ref 22–32)
pCO2, Ven: 21.5 mmHg — ABNORMAL LOW (ref 44–60)
pH, Ven: 7.195 — CL (ref 7.25–7.43)
pO2, Ven: 74 mmHg — ABNORMAL HIGH (ref 32–45)

## 2022-04-30 LAB — TSH: TSH: 3.395 u[IU]/mL (ref 0.350–4.500)

## 2022-04-30 LAB — CBC
HCT: 28 % — ABNORMAL LOW (ref 36.0–46.0)
Hemoglobin: 8.7 g/dL — ABNORMAL LOW (ref 12.0–15.0)
MCH: 26.4 pg (ref 26.0–34.0)
MCHC: 31.1 g/dL (ref 30.0–36.0)
MCV: 84.8 fL (ref 80.0–100.0)
Platelets: 318 10*3/uL (ref 150–400)
RBC: 3.3 MIL/uL — ABNORMAL LOW (ref 3.87–5.11)
RDW: 18.6 % — ABNORMAL HIGH (ref 11.5–15.5)
WBC: 7.3 10*3/uL (ref 4.0–10.5)
nRBC: 0.6 % — ABNORMAL HIGH (ref 0.0–0.2)

## 2022-04-30 LAB — PHOSPHORUS: Phosphorus: >30 mg/dL — ABNORMAL HIGH (ref 2.5–4.6)

## 2022-04-30 LAB — MAGNESIUM: Magnesium: 2.6 mg/dL — ABNORMAL HIGH (ref 1.7–2.4)

## 2022-04-30 LAB — CBG MONITORING, ED
Glucose-Capillary: 97 mg/dL (ref 70–99)
Glucose-Capillary: 90 mg/dL (ref 70–99)

## 2022-04-30 LAB — HEPATITIS B SURFACE ANTIGEN: Hepatitis B Surface Ag: NONREACTIVE

## 2022-04-30 SURGERY — ARTERIOVENOUS (AV) FISTULA CREATION
Anesthesia: General | Site: Neck | Laterality: Right

## 2022-04-30 MED ORDER — AMLODIPINE BESYLATE 5 MG PO TABS: 5.0000 mg | ORAL_TABLET | Freq: Every day | ORAL | Status: DC

## 2022-04-30 MED ORDER — EPHEDRINE SULFATE-NACL 50-0.9 MG/10ML-% IV SOSY
PREFILLED_SYRINGE | INTRAVENOUS | Status: DC | PRN
  Administered 2022-04-30: 5 mg via INTRAVENOUS
  Administered 2022-04-30 (×2): 10 mg via INTRAVENOUS

## 2022-04-30 MED ORDER — LIDOCAINE 2% (20 MG/ML) 5 ML SYRINGE
INTRAMUSCULAR | Status: DC | PRN
  Administered 2022-04-30: 60 mg via INTRAVENOUS

## 2022-04-30 MED ORDER — CARVEDILOL 12.5 MG PO TABS
12.5000 mg | ORAL_TABLET | Freq: Two times a day (BID) | ORAL | Status: DC
  Administered 2022-04-30 – 2022-05-02 (×6): 12.5 mg via ORAL
  Filled 2022-04-30 (×6): qty 1

## 2022-04-30 MED ORDER — HEPARIN SODIUM (PORCINE) 1000 UNIT/ML IJ SOLN
INTRAMUSCULAR | Status: AC
  Filled 2022-04-30: qty 10

## 2022-04-30 MED ORDER — LATANOPROST 0.005 % OP SOLN
1.0000 [drp] | Freq: Every day | OPHTHALMIC | Status: DC
  Administered 2022-05-01 – 2022-05-04 (×5): 1 [drp] via OPHTHALMIC
  Filled 2022-04-30: qty 2.5

## 2022-04-30 MED ORDER — HEPARIN 6000 UNIT IRRIGATION SOLUTION
Status: DC | PRN
  Administered 2022-04-30: 1

## 2022-04-30 MED ORDER — SODIUM CHLORIDE 0.9 % IV SOLN: INTRAVENOUS | Status: DC

## 2022-04-30 MED ORDER — LIDOCAINE-PRILOCAINE 2.5-2.5 % EX CREA: 1.0000 | TOPICAL_CREAM | CUTANEOUS | Status: DC | PRN

## 2022-04-30 MED ORDER — SODIUM CHLORIDE 0.9% FLUSH: 3.0000 mL | INTRAVENOUS | Status: DC | PRN

## 2022-04-30 MED ORDER — HEPARIN SODIUM (PORCINE) 1000 UNIT/ML IJ SOLN
INTRAMUSCULAR | Status: DC | PRN
  Administered 2022-04-30: 3200 [IU]

## 2022-04-30 MED ORDER — PENTAFLUOROPROP-TETRAFLUOROETH EX AERO: 1.0000 | INHALATION_SPRAY | CUTANEOUS | Status: DC | PRN

## 2022-04-30 MED ORDER — PHENYLEPHRINE HCL-NACL 20-0.9 MG/250ML-% IV SOLN
INTRAVENOUS | Status: DC | PRN
  Administered 2022-04-30: 40 ug/min via INTRAVENOUS

## 2022-04-30 MED ORDER — FENTANYL CITRATE (PF) 250 MCG/5ML IJ SOLN
INTRAMUSCULAR | Status: DC | PRN
  Administered 2022-04-30 (×5): 25 ug via INTRAVENOUS

## 2022-04-30 MED ORDER — SODIUM BICARBONATE 8.4 % IV SOLN
50.0000 meq | Freq: Once | INTRAVENOUS | Status: AC
  Administered 2022-04-30: 50 meq via INTRAVENOUS
  Filled 2022-04-30: qty 50

## 2022-04-30 MED ORDER — AMISULPRIDE (ANTIEMETIC) 5 MG/2ML IV SOLN
INTRAVENOUS | Status: AC
  Filled 2022-04-30: qty 2

## 2022-04-30 MED ORDER — ACETAMINOPHEN 325 MG PO TABS
650.0000 mg | ORAL_TABLET | Freq: Four times a day (QID) | ORAL | Status: DC | PRN
  Administered 2022-04-30 – 2022-05-01 (×2): 650 mg via ORAL
  Filled 2022-04-30 (×2): qty 2

## 2022-04-30 MED ORDER — ALTEPLASE 2 MG IJ SOLR: 2.0000 mg | Freq: Once | INTRAMUSCULAR | Status: DC | PRN

## 2022-04-30 MED ORDER — PROPOFOL 10 MG/ML IV BOLUS
INTRAVENOUS | Status: AC
  Filled 2022-04-30: qty 20

## 2022-04-30 MED ORDER — ORAL CARE MOUTH RINSE: 15.0000 mL | Freq: Once | OROMUCOSAL | Status: DC

## 2022-04-30 MED ORDER — ANTICOAGULANT SODIUM CITRATE 4% (200MG/5ML) IV SOLN: 5.0000 mL | Status: DC | PRN

## 2022-04-30 MED ORDER — LIDOCAINE-EPINEPHRINE (PF) 1 %-1:200000 IJ SOLN
INTRAMUSCULAR | Status: AC
  Filled 2022-04-30: qty 30

## 2022-04-30 MED ORDER — ALBUTEROL SULFATE HFA 108 (90 BASE) MCG/ACT IN AERS: 2.0000 | INHALATION_SPRAY | Freq: Four times a day (QID) | RESPIRATORY_TRACT | Status: DC | PRN

## 2022-04-30 MED ORDER — FENTANYL CITRATE (PF) 250 MCG/5ML IJ SOLN
INTRAMUSCULAR | Status: AC
  Filled 2022-04-30: qty 5

## 2022-04-30 MED ORDER — SODIUM CHLORIDE 0.9% FLUSH
3.0000 mL | Freq: Two times a day (BID) | INTRAVENOUS | Status: DC
  Administered 2022-04-30 – 2022-05-04 (×11): 3 mL via INTRAVENOUS

## 2022-04-30 MED ORDER — CALCIUM GLUCONATE-NACL 1-0.675 GM/50ML-% IV SOLN
1.0000 g | Freq: Once | INTRAVENOUS | Status: AC
  Administered 2022-04-30: 1000 mg via INTRAVENOUS
  Filled 2022-04-30: qty 50

## 2022-04-30 MED ORDER — LIDOCAINE HCL (PF) 1 % IJ SOLN: 5.0000 mL | INTRAMUSCULAR | Status: DC | PRN

## 2022-04-30 MED ORDER — ONDANSETRON HCL 4 MG/2ML IJ SOLN
4.0000 mg | Freq: Four times a day (QID) | INTRAMUSCULAR | Status: DC | PRN
  Filled 2022-04-30: qty 2

## 2022-04-30 MED ORDER — TIMOLOL MALEATE 0.5 % OP SOLG
1.0000 [drp] | Freq: Every morning | OPHTHALMIC | Status: DC
  Administered 2022-05-01 – 2022-05-05 (×4): 1 [drp] via OPHTHALMIC
  Filled 2022-04-30: qty 5

## 2022-04-30 MED ORDER — CHLORHEXIDINE GLUCONATE 0.12 % MT SOLN
OROMUCOSAL | Status: AC
  Administered 2022-04-30: 15 mL
  Filled 2022-04-30: qty 15

## 2022-04-30 MED ORDER — CEFAZOLIN SODIUM-DEXTROSE 2-4 GM/100ML-% IV SOLN
INTRAVENOUS | Status: AC
  Filled 2022-04-30: qty 100

## 2022-04-30 MED ORDER — CHLORHEXIDINE GLUCONATE 0.12 % MT SOLN: 15.0000 mL | Freq: Once | OROMUCOSAL | Status: DC

## 2022-04-30 MED ORDER — HEPARIN SODIUM (PORCINE) 1000 UNIT/ML DIALYSIS
1000.0000 [IU] | INTRAMUSCULAR | Status: DC | PRN
  Administered 2022-05-02: 2600 [IU]
  Administered 2022-05-04: 1000 [IU]
  Filled 2022-04-30 (×4): qty 1

## 2022-04-30 MED ORDER — ONDANSETRON HCL 4 MG/2ML IJ SOLN
INTRAMUSCULAR | Status: AC
  Filled 2022-04-30: qty 2

## 2022-04-30 MED ORDER — AMISULPRIDE (ANTIEMETIC) 5 MG/2ML IV SOLN
5.0000 mg | Freq: Once | INTRAVENOUS | Status: AC | PRN
  Administered 2022-04-30: 5 mg via INTRAVENOUS

## 2022-04-30 MED ORDER — FENTANYL CITRATE (PF) 100 MCG/2ML IJ SOLN
25.0000 ug | INTRAMUSCULAR | Status: DC | PRN
  Administered 2022-04-30: 25 ug via INTRAVENOUS

## 2022-04-30 MED ORDER — FENTANYL CITRATE (PF) 100 MCG/2ML IJ SOLN
INTRAMUSCULAR | Status: AC
  Filled 2022-04-30: qty 2

## 2022-04-30 MED ORDER — PHENYLEPHRINE HCL-NACL 20-0.9 MG/250ML-% IV SOLN
INTRAVENOUS | Status: AC
  Filled 2022-04-30: qty 500

## 2022-04-30 MED ORDER — PROPOFOL 10 MG/ML IV BOLUS
INTRAVENOUS | Status: DC | PRN
  Administered 2022-04-30: 30 mg via INTRAVENOUS
  Administered 2022-04-30: 100 mg via INTRAVENOUS

## 2022-04-30 MED ORDER — HEPARIN 6000 UNIT IRRIGATION SOLUTION
Status: AC
  Filled 2022-04-30: qty 500

## 2022-04-30 MED ORDER — ONDANSETRON HCL 4 MG/2ML IJ SOLN
INTRAMUSCULAR | Status: DC | PRN
  Administered 2022-04-30: 4 mg via INTRAVENOUS

## 2022-04-30 MED ORDER — LIDOCAINE 2% (20 MG/ML) 5 ML SYRINGE
INTRAMUSCULAR | Status: AC
  Filled 2022-04-30: qty 5

## 2022-04-30 MED ORDER — 0.9 % SODIUM CHLORIDE (POUR BTL) OPTIME
TOPICAL | Status: DC | PRN
  Administered 2022-04-30: 1000 mL

## 2022-04-30 MED ORDER — HYDROCODONE-ACETAMINOPHEN 5-325 MG PO TABS
1.0000 | ORAL_TABLET | ORAL | Status: DC | PRN
  Administered 2022-04-30 – 2022-05-03 (×4): 2 via ORAL
  Filled 2022-04-30 (×4): qty 2

## 2022-04-30 MED ORDER — EPHEDRINE 5 MG/ML INJ
INTRAVENOUS | Status: AC
  Filled 2022-04-30: qty 5

## 2022-04-30 MED ORDER — ONDANSETRON HCL 4 MG PO TABS
4.0000 mg | ORAL_TABLET | Freq: Four times a day (QID) | ORAL | Status: DC | PRN
  Administered 2022-05-01 – 2022-05-03 (×2): 4 mg via ORAL
  Filled 2022-04-30 (×2): qty 1

## 2022-04-30 MED ORDER — SODIUM CHLORIDE 0.9 % IV SOLN: 250.0000 mL | INTRAVENOUS | Status: DC | PRN

## 2022-04-30 MED ORDER — ACETAMINOPHEN 650 MG RE SUPP: 650.0000 mg | Freq: Four times a day (QID) | RECTAL | Status: DC | PRN

## 2022-04-30 MED ORDER — CEFAZOLIN SODIUM-DEXTROSE 2-4 GM/100ML-% IV SOLN
2.0000 g | Freq: Once | INTRAVENOUS | Status: AC
  Administered 2022-04-30: 2 g via INTRAVENOUS

## 2022-04-30 SURGICAL SUPPLY — 57 items
ADH SKN CLS APL DERMABOND .7 (GAUZE/BANDAGES/DRESSINGS) ×4
ARMBAND PINK RESTRICT EXTREMIT (MISCELLANEOUS) ×2 IMPLANT
BAG COUNTER SPONGE SURGICOUNT (BAG) ×2 IMPLANT
BAG DECANTER FOR FLEXI CONT (MISCELLANEOUS) ×2 IMPLANT
BAG SPNG CNTER NS LX DISP (BAG) ×2
BIOPATCH RED 1 DISK 7.0 (GAUZE/BANDAGES/DRESSINGS) ×2 IMPLANT
CANISTER SUCT 3000ML PPV (MISCELLANEOUS) ×2 IMPLANT
CATH PALINDROME-P 19CM W/VT (CATHETERS) IMPLANT
CATH PALINDROME-P 23CM W/VT (CATHETERS) IMPLANT
CATH PALINDROME-P 28CM W/VT (CATHETERS) IMPLANT
CLIP LIGATING EXTRA MED SLVR (CLIP) ×2 IMPLANT
CLIP LIGATING EXTRA SM BLUE (MISCELLANEOUS) ×2 IMPLANT
COVER PROBE W GEL 5X96 (DRAPES) ×2 IMPLANT
COVER SURGICAL LIGHT HANDLE (MISCELLANEOUS) ×2 IMPLANT
DERMABOND ADVANCED .7 DNX12 (GAUZE/BANDAGES/DRESSINGS) ×2 IMPLANT
DRAPE C-ARM 42X72 X-RAY (DRAPES) ×2 IMPLANT
DRAPE CHEST BREAST 15X10 FENES (DRAPES) ×2 IMPLANT
ELECT REM PT RETURN 9FT ADLT (ELECTROSURGICAL) ×2
ELECTRODE REM PT RTRN 9FT ADLT (ELECTROSURGICAL) ×2 IMPLANT
GAUZE 4X4 16PLY ~~LOC~~+RFID DBL (SPONGE) ×2 IMPLANT
GLOVE BIO SURGEON STRL SZ7.5 (GLOVE) ×2 IMPLANT
GOWN STRL REUS W/ TWL LRG LVL3 (GOWN DISPOSABLE) ×4 IMPLANT
GOWN STRL REUS W/ TWL XL LVL3 (GOWN DISPOSABLE) ×2 IMPLANT
GOWN STRL REUS W/TWL LRG LVL3 (GOWN DISPOSABLE) ×4
GOWN STRL REUS W/TWL XL LVL3 (GOWN DISPOSABLE) ×2
INSERT FOGARTY SM (MISCELLANEOUS) IMPLANT
KIT BASIN OR (CUSTOM PROCEDURE TRAY) ×2 IMPLANT
KIT PALINDROME-P 55CM (CATHETERS) IMPLANT
KIT TURNOVER KIT B (KITS) ×2 IMPLANT
NDL 18GX1X1/2 (RX/OR ONLY) (NEEDLE) ×2 IMPLANT
NDL HYPO 25GX1X1/2 BEV (NEEDLE) ×2 IMPLANT
NEEDLE 18GX1X1/2 (RX/OR ONLY) (NEEDLE) ×2 IMPLANT
NEEDLE HYPO 25GX1X1/2 BEV (NEEDLE) ×2 IMPLANT
NS IRRIG 1000ML POUR BTL (IV SOLUTION) ×2 IMPLANT
PACK BASIC III (CUSTOM PROCEDURE TRAY) ×2
PACK CV ACCESS (CUSTOM PROCEDURE TRAY) ×2 IMPLANT
PACK SRG BSC III STRL LF ECLPS (CUSTOM PROCEDURE TRAY) ×2 IMPLANT
PAD ARMBOARD 7.5X6 YLW CONV (MISCELLANEOUS) ×4 IMPLANT
POWDER SURGICEL 3.0 GRAM (HEMOSTASIS) IMPLANT
SET MICROPUNCTURE 5F STIFF (MISCELLANEOUS) IMPLANT
SLING ARM FOAM STRAP LRG (SOFTGOODS) IMPLANT
SLING ARM FOAM STRAP MED (SOFTGOODS) IMPLANT
SOAP 2 % CHG 4 OZ (WOUND CARE) ×2 IMPLANT
SUT ETHILON 3 0 PS 1 (SUTURE) ×2 IMPLANT
SUT MNCRL AB 4-0 PS2 18 (SUTURE) ×2 IMPLANT
SUT PROLENE 6 0 BV (SUTURE) ×2 IMPLANT
SUT VIC AB 3-0 SH 27 (SUTURE) ×2
SUT VIC AB 3-0 SH 27X BRD (SUTURE) ×2 IMPLANT
SYR 10ML LL (SYRINGE) ×2 IMPLANT
SYR 20CC LL (SYRINGE) IMPLANT
SYR 20ML LL LF (SYRINGE) ×4 IMPLANT
SYR 5ML LL (SYRINGE) ×2 IMPLANT
SYR CONTROL 10ML LL (SYRINGE) ×2 IMPLANT
TOWEL GREEN STERILE (TOWEL DISPOSABLE) ×2 IMPLANT
TOWEL GREEN STERILE FF (TOWEL DISPOSABLE) ×4 IMPLANT
UNDERPAD 30X36 HEAVY ABSORB (UNDERPADS AND DIAPERS) ×2 IMPLANT
WATER STERILE IRR 1000ML POUR (IV SOLUTION) ×2 IMPLANT

## 2022-04-30 NOTE — Anesthesia Procedure Notes (Signed)
Procedure Name: LMA Insertion Date/Time: 04/30/2022 11:30 AM  Performed by: Ayesha Rumpf, CRNAPre-anesthesia Checklist: Patient identified, Emergency Drugs available, Suction available and Patient being monitored Patient Re-evaluated:Patient Re-evaluated prior to induction Oxygen Delivery Method: Circle System Utilized Preoxygenation: Pre-oxygenation with 100% oxygen Induction Type: IV induction Ventilation: Mask ventilation without difficulty LMA: LMA inserted LMA Size: 4.0 Number of attempts: 1 Airway Equipment and Method: Bite block Placement Confirmation: positive ETCO2 Tube secured with: Tape Dental Injury: Teeth and Oropharynx as per pre-operative assessment

## 2022-04-30 NOTE — Discharge Instructions (Addendum)
[facility]  Discharge Instructions  AV Fistula or Graft Surgery for Dialysis Access  Please refer to the following instructions for your post-procedure care. Your surgeon or physician assistant will discuss any changes with you.  Activity  You may drive the day following your surgery, if you are comfortable and no longer taking prescription pain medication. Resume full activity as the soreness in your incision resolves.  Bathing/Showering  You may shower after you go home. Keep your incision dry for 48 hours. Do not soak in a bathtub, hot tub, or swim until the incision heals completely. You may not shower if you have a hemodialysis catheter.  Incision Care  Clean your incision with mild soap and water after 48 hours. Pat the area dry with a clean towel. You do not need a bandage unless otherwise instructed. Do not apply any ointments or creams to your incision. You may have skin glue on your incision. Do not peel it off. It will come off on its own in about one week. Your arm may swell a bit after surgery. To reduce swelling use pillows to elevate your arm so it is above your heart. Your doctor will tell you if you need to lightly wrap your arm with an ACE bandage.  Diet  Resume your normal diet. There are not special food restrictions following this procedure. In order to heal from your surgery, it is CRITICAL to get adequate nutrition. Your body requires vitamins, minerals, and protein. Vegetables are the best source of vitamins and minerals. Vegetables also provide the perfect balance of protein. Processed food has little nutritional value, so try to avoid this.  Medications  Resume taking all of your medications. If your incision is causing pain, you may take over-the counter pain relievers such as acetaminophen (Tylenol). If you were prescribed a stronger pain medication, please be aware these medications can cause nausea and constipation. Prevent  nausea by taking the medication with a snack or meal. Avoid constipation by drinking plenty of fluids and eating foods with high amount of fiber, such as fruits, vegetables, and grains.  Do not take Tylenol if you are taking prescription pain medications.  Follow up Your surgeon may want to see you in the office following your access surgery. If so, this will be arranged at the time of your surgery.  Please call us immediately for any of the following conditions:  Increased pain, redness, drainage (pus) from your incision site Fever of 101 degrees or higher Severe or worsening pain at your incision site Hand pain or numbness.  Reduce your risk of vascular disease:  Stop smoking. If you would like help, call [facility] at 1-800-QUIT-NOW (8148266674) or [place] at [phone]  Manage your cholesterol Maintain a desired weight Control your diabetes Keep your blood pressure down  Dialysis  It will take several weeks to several months for your new dialysis access to be ready for use. Your surgeon will determine when it is okay to use it. Your nephrologist will continue to direct your dialysis. You can continue to use your Permcath until your new access is ready for use.   04/30/2022 Michelle Cummings [id number] 25-Mar-1950  Surgeon(s): Maeola Harman, MD  Procedure(s): LEFT ARM ARTERIOVENOUS (AV) FISTULA CREATION INSERTION OF PALINDROME 19CM DIALYSIS CATHETER  x Do not stick fistula for 12 weeks    If you have any questions, please call the office at [phone].  Information on my medicine - ELIQUIS (apixaban)  This  medication education was reviewed with me or my healthcare representative as part of my discharge preparation.    Why was Eliquis prescribed for you? Eliquis was prescribed for you to reduce the risk of a blood clot forming that can cause a stroke if you have a medical condition called atrial fibrillation (a type of irregular heartbeat).  What  do You need to know about Eliquis ? Take your Eliquis TWICE DAILY - one tablet in the morning and one tablet in the evening with or without food. If you have difficulty swallowing the tablet whole please discuss with your pharmacist how to take the medication safely.  Take Eliquis exactly as prescribed by your doctor and DO NOT stop taking Eliquis without talking to the doctor who prescribed the medication.  Stopping may increase your risk of developing a stroke.  Refill your prescription before you run out.  After discharge, you should have regular check-up appointments with your healthcare provider that is prescribing your Eliquis.  In the future your dose may need to be changed if your kidney function or weight changes by a significant amount or as you get older.  What do you do if you miss a dose? If you miss a dose, take it as soon as you remember on the same day and resume taking twice daily.  Do not take more than one dose of ELIQUIS at the same time to make up a missed dose.  Important Safety Information A possible side effect of Eliquis is bleeding. You should call your healthcare provider right away if you experience any of the following: Bleeding from an injury or your nose that does not stop. Unusual colored urine (red or dark brown) or unusual colored stools (red or black). Unusual bruising for unknown reasons. A serious fall or if you hit your head (even if there is no bleeding).  Some medicines may interact with Eliquis and might increase your risk of bleeding or clotting while on Eliquis. To help avoid this, consult your healthcare provider or pharmacist prior to using any new prescription or non-prescription medications, including herbals, vitamins, non-steroidal anti-inflammatory drugs (NSAIDs) and supplements.  This website has more information on Eliquis (apixaban): http://www.eliquis.com/eliquis/home

## 2022-04-30 NOTE — Assessment & Plan Note (Signed)
Continue eyedrops 

## 2022-04-30 NOTE — Assessment & Plan Note (Signed)
-   See CKD/ESRD

## 2022-04-30 NOTE — Assessment & Plan Note (Addendum)
-   Continue Norvasc, Coreg, hydralazine - Agree, may be able to wean some medications with ultrafiltration from dialysis - BP still above goal; PRN meds added

## 2022-04-30 NOTE — Assessment & Plan Note (Signed)
-   No longer on Lipitor due to myalgias

## 2022-04-30 NOTE — Assessment & Plan Note (Addendum)
-   Continue home regimen 

## 2022-04-30 NOTE — Assessment & Plan Note (Addendum)
-   Now progressed to ESRD - Underwent right PermCath placement 4/25 with vascular surgery along with left AV fistula creation - inpatient HD per nephrology.  Patient arranged for dialysis outpatient.  Going to [facility] at [facility]. Mon,Tues,Thurs,Fri schedule

## 2022-04-30 NOTE — Transfer of Care (Signed)
Immediate Anesthesia Transfer of Care Note  Patient: Michelle Cummings  Procedure(s) Performed: LEFT ARM CEPHALIC VEIN ARTERIOVENOUS (AV) FISTULA CREATION TDC (Left: Arm Upper) INSERTION OF PALINDROME 19CM DIALYSIS CATHETER (Right: Neck)  Patient Location: PACU  Anesthesia Type:General  Level of Consciousness: awake, drowsy, patient cooperative, and responds to stimulation  Airway & Oxygen Therapy: Patient Spontanous Breathing and Patient connected to face mask oxygen  Post-op Assessment: Report given to RN and Post -op Vital signs reviewed and stable  Post vital signs: Reviewed and stable  Last Vitals:  Vitals Value Taken Time  BP 143/63 04/30/22 1255  Temp    Pulse 79 04/30/22 1256  Resp 19 04/30/22 1256  SpO2 99 % 04/30/22 1256  Vitals shown include unvalidated device data.  Last Pain:  Vitals:   04/30/22 1023  TempSrc: Oral  PainSc:          Complications: No notable events documented.

## 2022-04-30 NOTE — Progress Notes (Signed)
Subjective: Seen in ER.  Can tell that she is feeling poorly but no acute distress.  Much appreciate Dr. Randie Cummings for being able to place fistula and catheter today  Objective Vital signs in last 24 hours: Vitals:   04/30/22 0630 04/30/22 0638 04/30/22 0700 04/30/22 0730  BP: (!) 148/62 (!) 149/71 (!) 168/76 (!) 167/75  Pulse: 70 71 76 78  Resp: 15 19 (!) 25 (!) 27  Temp:  97.8 F (36.6 C)    TempSrc:  Oral    SpO2: 96% 96% 96% 96%  Weight:      Height:       Weight change:  No intake or output data in the 24 hours ending 04/30/22 0839  Assessment/ Plan: Pt is a 72 y.o. yo female with stage V CKD who was admitted on 04/29/2022 with uremia and needing to start dialysis Assessment/Plan: 1.  Uremia-this has progressed pretty significantly in the last 6 weeks.  We had planned to place fistula but now need to place a fistula and graft and start dialysis for her uremic symptoms.  Plan is for operative intervention to place fistula in catheter and to get her first dialysis treatment later today.  Will look to get her established with an outpatient dialysis unit upon discharge 2.  Metabolic acidosis-pretty severe.  Has received amps of bicarb but dialysis will help to regulate 3. Anemia-has been on ESA and iron as an outpatient for a long time.  Continue with these treatments 4. Secondary hyperparathyroidism-last PTH 390 and with low calcium will start vitamin D with dialysis.  Patient will also need a phosphate binder, will start with calcium based 5. HTN/volume-continue with home Norvasc, Coreg and hydralazine.  May be able to wean blood pressure medications with dialysis regulating volume status  Michelle Cummings    Labs: Basic Metabolic Panel: Recent Labs  Lab 04/29/22 1843 04/29/22 1857 04/29/22 2250 04/30/22 0124 04/30/22 0146  NA 134* 132* 136 136 134*  K 5.1 5.0 5.4* 5.2* 5.2*  CL 103 106 104 103  --   CO2 9*  --  10* 9*  --   GLUCOSE 186* 181* 128* 119*  --   BUN 203*  >130* 208* 209*  --   CREATININE 17.63* >18.00* 17.50* 17.50*  --   CALCIUM 7.0*  --  6.9* 7.0*  --   PHOS  --   --  >30.0* >30.0*  --    Liver Function Tests: Recent Labs  Lab 04/29/22 1843 04/29/22 2250 04/30/22 0124  AST 10*  --  11*  ALT 26  --  26  ALKPHOS 74  --  68  BILITOT 0.7  --  0.6  PROT 5.6*  --  5.9*  ALBUMIN 2.8* 2.7* 2.8*   No results for input(s): "LIPASE", "AMYLASE" in the last 168 hours. No results for input(s): "AMMONIA" in the last 168 hours. CBC: Recent Labs  Lab 04/29/22 1843 04/29/22 1857 04/30/22 0124 04/30/22 0146  WBC 6.7  --  7.3  --   NEUTROABS 5.3  --   --   --   HGB 9.1* 9.9* 8.7* 9.2*  HCT 28.4* 29.0* 28.0* 27.0*  MCV 84.3  --  84.8  --   PLT 331  --  318  --    Cardiac Enzymes: No results for input(s): "CKTOTAL", "CKMB", "CKMBINDEX", "TROPONINI" in the last 168 hours. CBG: Recent Labs  Lab 04/29/22 2315 04/30/22 0317 04/30/22 0828  GLUCAP 127* 97 90    Iron Studies: No  results for input(s): "IRON", "TIBC", "TRANSFERRIN", "FERRITIN" in the last 72 hours. Studies/Results: DG Chest 1 View  Result Date: 04/29/2022 CLINICAL DATA:  Shortness of breath EXAM: CHEST  1 VIEW COMPARISON:  04/21/2022 FINDINGS: Transverse diameter of heart is increased. Central pulmonary vessels are prominent. There are no signs of alveolar pulmonary edema. Small bilateral pleural effusions are seen, more so on the left side with interval increase. Evaluation of lower lung fields for infiltrates is limited by pleural effusions. There is no pneumothorax. IMPRESSION: Cardiomegaly. Central pulmonary vessels are prominent without signs of alveolar pulmonary edema. Small bilateral pleural effusions, more so on the left side with interval increase. Electronically Signed   By: Ernie Avena M.D.   On: 04/29/2022 19:17   Medications: Infusions:  sodium chloride     anticoagulant sodium citrate      Scheduled Medications:  amLODipine  5 mg Oral Daily    carvedilol  12.5 mg Oral BID   Chlorhexidine Gluconate Cloth  6 each Topical Q0600   insulin aspart  0-6 Units Subcutaneous Q4H   latanoprost  1 drop Both Eyes QHS   sodium bicarbonate  1,300 mg Oral TID   sodium chloride flush  3 mL Intravenous Q12H   timolol  1 drop Both Eyes q AM    have reviewed scheduled and prn medications.  Physical Exam: General: Actually pretty well-appearing black female in no acute distress but is feeling poorly Heart: Regular rate and rhythm Lungs: Mostly clear Abdomen: Soft, nontender, nondistended Extremities: Trace to 1+ edema Dialysis Access: Not placed yet   04/30/2022,8:39 AM  LOS: 1 day

## 2022-04-30 NOTE — Progress Notes (Signed)
Progress Note    Michelle Cummings   UJW:[id number]  DOB: [birth year]  DOA: 04/29/2022     1 PCP: Michelle Peng, MD  Initial CC: sent to ER for worsening renal failure  Hospital Course: Michelle Cummings is a 72 year old female with PMH CKD 5, anemia of chronic disease, DM II, glaucoma, HLD, HTN who presented with anorexia and fatigue.  She has been followed closely by nephrology outpatient.  She was sent to the ER from her doctor's office due to worsened kidney failure and in need of initiating dialysis.  Interval History:  Seen this morning in the ER.  She did not have overt encephalopathy and was able to understand today's plan well regarding obtaining dialysis access and initiating dialysis inpatient.  Assessment and Plan: * Acute renal failure superimposed on stage 5 chronic kidney disease, not on chronic dialysis - Now progressed to ESRD - Underwent right PermCath placement today with vascular surgery along with left AV fistula creation - inpatient HD per nephrology and will need outpatient HD chair prior to d/c  ESRD on dialysis [facility]) - see acute on CKD5  HTN (hypertension) - Continue Norvasc, Coreg, hydralazine - Agree, may be able to wean some medications with ultrafiltration from dialysis  Anemia of chronic disease - Baseline 9 to 10 g/dL - Currently at baseline  Fluid overload-resolved as of 04/30/2022 - See CKD/ESRD  Glaucoma - Continue eyedrops  HLD (hyperlipidemia) - No longer on Lipitor due to myalgias  Type 2 diabetes mellitus with stage 3 chronic kidney disease (HCC) - Continue SSI and CBG monitoring   Old records reviewed in assessment of this patient  Antimicrobials:   DVT prophylaxis:  SCDs Start: 04/30/22 0043   Code Status:   Code Status: Full Code  Mobility Assessment (last 72 hours)     Mobility Assessment     Row Name 04/30/22 07:22:37           Does patient have an order for bedrest or is patient medically unstable No - Continue  assessment       What is the highest level of mobility based on the progressive mobility assessment? Level 6 (Walks independently in room and hall) - Balance while walking in room without assist - Complete                Barriers to discharge: CLIP Disposition Plan:  home  Status is: Inpt  Objective: Blood pressure (!) 155/62, pulse 77, temperature (!) 97.5 F (36.4 C), temperature source Oral, resp. rate 15, height  (1.626 m), weight 66.7 kg, SpO2 96 %.  Examination:  Physical Exam Constitutional:      Appearance: Normal appearance.  HENT:     Head: Normocephalic and atraumatic.     Mouth/Throat:     Mouth: Mucous membranes are moist.  Eyes:     Extraocular Movements: Extraocular movements intact.  Cardiovascular:     Rate and Rhythm: Normal rate and regular rhythm.  Pulmonary:     Effort: Pulmonary effort is normal. No respiratory distress.     Breath sounds: Normal breath sounds. No wheezing.  Abdominal:     General: Bowel sounds are normal. There is no distension.     Palpations: Abdomen is soft.     Tenderness: There is no abdominal tenderness.  Musculoskeletal:        General: Normal range of motion.     Cervical back: Normal range of motion and neck supple.  Skin:    General: Skin is warm  and dry.  Neurological:     General: No focal deficit present.     Mental Status: She is alert.  Psychiatric:        Mood and Affect: Mood normal.      Consultants:  Nephrology Vascular surgery  Procedures:  04/30/22:  Procedure Performed: 1.  Right IJ 19 cm tunneled dialysis catheter placement with ultrasound fluoroscopic guidance 2.  Creation of left brachial artery to cephalic vein AV fistula with limited left brachial artery endarterectomy  Data Reviewed: Results for orders placed or performed during the hospital encounter of 04/29/22 (from the past 24 hour(s))  SARS Coronavirus 2 by RT PCR (hospital order, performed in [facility] hospital lab) *cepheid  single result test* Anterior Nasal Swab     Status: None   Collection Time: 04/29/22  6:35 PM   Specimen: Anterior Nasal Swab  Result Value Ref Range   SARS Coronavirus 2 by RT PCR NEGATIVE NEGATIVE  Brain natriuretic peptide     Status: Abnormal   Collection Time: 04/29/22  6:43 PM  Result Value Ref Range   B Natriuretic Peptide 496.4 (H) 0.0 - 100.0 pg/mL  CBC with Differential/Platelet     Status: Abnormal   Collection Time: 04/29/22  6:43 PM  Result Value Ref Range   WBC 6.7 4.0 - 10.5 K/uL   RBC 3.37 (L) 3.87 - 5.11 MIL/uL   Hemoglobin 9.1 (L) 12.0 - 15.0 g/dL   HCT 16.1 (L) 09.6 - 04.5 %   MCV 84.3 80.0 - 100.0 fL   MCH 27.0 26.0 - 34.0 pg   MCHC 32.0 30.0 - 36.0 g/dL   RDW 40.9 (H) 81.1 - 91.4 %   Platelets 331 150 - 400 K/uL   nRBC 1.1 (H) 0.0 - 0.2 %   Neutrophils Relative % 79 %   Neutro Abs 5.3 1.7 - 7.7 K/uL   Lymphocytes Relative 8 %   Lymphs Abs 0.6 (L) 0.7 - 4.0 K/uL   Monocytes Relative 11 %   Monocytes Absolute 0.7 0.1 - 1.0 K/uL   Eosinophils Relative 1 %   Eosinophils Absolute 0.0 0.0 - 0.5 K/uL   Basophils Relative 0 %   Basophils Absolute 0.0 0.0 - 0.1 K/uL   Immature Granulocytes 1 %   Abs Immature Granulocytes 0.04 0.00 - 0.07 K/uL  Comprehensive metabolic panel     Status: Abnormal   Collection Time: 04/29/22  6:43 PM  Result Value Ref Range   Sodium 134 (L) 135 - 145 mmol/L   Potassium 5.1 3.5 - 5.1 mmol/L   Chloride 103 98 - 111 mmol/L   CO2 9 (L) 22 - 32 mmol/L   Glucose, Bld 186 (H) 70 - 99 mg/dL   BUN 782 (H) 8 - 23 mg/dL   Creatinine, Ser 95.62 (H) 0.44 - 1.00 mg/dL   Calcium 7.0 (L) 8.9 - 10.3 mg/dL   Total Protein 5.6 (L) 6.5 - 8.1 g/dL   Albumin 2.8 (L) 3.5 - 5.0 g/dL   AST 10 (L) 15 - 41 U/L   ALT 26 0 - 44 U/L   Alkaline Phosphatase 74 38 - 126 U/L   Total Bilirubin 0.7 0.3 - 1.2 mg/dL   GFR, Estimated 2 (L) >60 mL/min   Anion gap 22 (H) 5 - 15  Magnesium     Status: Abnormal   Collection Time: 04/29/22  6:43 PM  Result Value  Ref Range   Magnesium 2.7 (H) 1.7 - 2.4 mg/dL  I-stat chem 8, ED (  not at [facility], [facility] or [facility])     Status: Abnormal   Collection Time: 04/29/22  6:57 PM  Result Value Ref Range   Sodium 132 (L) 135 - 145 mmol/L   Potassium 5.0 3.5 - 5.1 mmol/L   Chloride 106 98 - 111 mmol/L   BUN >130 (H) 8 - 23 mg/dL   Creatinine, Ser >16.10 (H) 0.44 - 1.00 mg/dL   Glucose, Bld 960 (H) 70 - 99 mg/dL   Calcium, Ion 4.54 (LL) 1.15 - 1.40 mmol/L   TCO2 10 (L) 22 - 32 mmol/L   Hemoglobin 9.9 (L) 12.0 - 15.0 g/dL   HCT 09.8 (L) 11.9 - 14.7 %   Comment NOTIFIED PHYSICIAN   Hepatitis B surface antigen     Status: None   Collection Time: 04/29/22 10:50 PM  Result Value Ref Range   Hepatitis B Surface Ag NON REACTIVE NON REACTIVE  Renal function panel     Status: Abnormal   Collection Time: 04/29/22 10:50 PM  Result Value Ref Range   Sodium 136 135 - 145 mmol/L   Potassium 5.4 (H) 3.5 - 5.1 mmol/L   Chloride 104 98 - 111 mmol/L   CO2 10 (L) 22 - 32 mmol/L   Glucose, Bld 128 (H) 70 - 99 mg/dL   BUN 829 (H) 8 - 23 mg/dL   Creatinine, Ser 56.21 (H) 0.44 - 1.00 mg/dL   Calcium 6.9 (L) 8.9 - 10.3 mg/dL   Phosphorus >30.8 (H) 2.5 - 4.6 mg/dL   Albumin 2.7 (L) 3.5 - 5.0 g/dL   GFR, Estimated 2 (L) >60 mL/min   Anion gap 22 (H) 5 - 15  CBG monitoring, ED     Status: Abnormal   Collection Time: 04/29/22 11:15 PM  Result Value Ref Range   Glucose-Capillary 127 (H) 70 - 99 mg/dL  CBC     Status: Abnormal   Collection Time: 04/30/22  1:24 AM  Result Value Ref Range   WBC 7.3 4.0 - 10.5 K/uL   RBC 3.30 (L) 3.87 - 5.11 MIL/uL   Hemoglobin 8.7 (L) 12.0 - 15.0 g/dL   HCT 65.7 (L) 84.6 - 96.2 %   MCV 84.8 80.0 - 100.0 fL   MCH 26.4 26.0 - 34.0 pg   MCHC 31.1 30.0 - 36.0 g/dL   RDW 95.2 (H) 84.1 - 32.4 %   Platelets 318 150 - 400 K/uL   nRBC 0.6 (H) 0.0 - 0.2 %  Magnesium     Status: Abnormal   Collection Time: 04/30/22  1:24 AM  Result Value Ref Range   Magnesium 2.6 (H) 1.7 - 2.4 mg/dL  Phosphorus      Status: Abnormal   Collection Time: 04/30/22  1:24 AM  Result Value Ref Range   Phosphorus >30.0 (H) 2.5 - 4.6 mg/dL  Comprehensive metabolic panel     Status: Abnormal   Collection Time: 04/30/22  1:24 AM  Result Value Ref Range   Sodium 136 135 - 145 mmol/L   Potassium 5.2 (H) 3.5 - 5.1 mmol/L   Chloride 103 98 - 111 mmol/L   CO2 9 (L) 22 - 32 mmol/L   Glucose, Bld 119 (H) 70 - 99 mg/dL   BUN 401 (H) 8 - 23 mg/dL   Creatinine, Ser 02.72 (H) 0.44 - 1.00 mg/dL   Calcium 7.0 (L) 8.9 - 10.3 mg/dL   Total Protein 5.9 (L) 6.5 - 8.1 g/dL   Albumin 2.8 (L) 3.5 - 5.0 g/dL   AST 11 (  L) 15 - 41 U/L   ALT 26 0 - 44 U/L   Alkaline Phosphatase 68 38 - 126 U/L   Total Bilirubin 0.6 0.3 - 1.2 mg/dL   GFR, Estimated 2 (L) >60 mL/min   Anion gap 24 (H) 5 - 15  TSH     Status: None   Collection Time: 04/30/22  1:24 AM  Result Value Ref Range   TSH 3.395 0.350 - 4.500 uIU/mL  I-Stat venous blood gas, ED     Status: Abnormal   Collection Time: 04/30/22  1:46 AM  Result Value Ref Range   pH, Ven 7.195 (LL) 7.25 - 7.43   pCO2, Ven 21.5 (L) 44 - 60 mmHg   pO2, Ven 74 (H) 32 - 45 mmHg   Bicarbonate 8.3 (L) 20.0 - 28.0 mmol/L   TCO2 9 (L) 22 - 32 mmol/L   O2 Saturation 91 %   Acid-base deficit 18.0 (H) 0.0 - 2.0 mmol/L   Sodium 134 (L) 135 - 145 mmol/L   Potassium 5.2 (H) 3.5 - 5.1 mmol/L   Calcium, Ion 0.89 (LL) 1.15 - 1.40 mmol/L   HCT 27.0 (L) 36.0 - 46.0 %   Hemoglobin 9.2 (L) 12.0 - 15.0 g/dL   Sample type VENOUS    Comment NOTIFIED PHYSICIAN   CBG monitoring, ED     Status: None   Collection Time: 04/30/22  3:17 AM  Result Value Ref Range   Glucose-Capillary 97 70 - 99 mg/dL  CBG monitoring, ED     Status: None   Collection Time: 04/30/22  8:28 AM  Result Value Ref Range   Glucose-Capillary 90 70 - 99 mg/dL  Glucose, capillary     Status: Abnormal   Collection Time: 04/30/22 10:25 AM  Result Value Ref Range   Glucose-Capillary 100 (H) 70 - 99 mg/dL  Glucose, capillary      Status: Abnormal   Collection Time: 04/30/22  1:12 PM  Result Value Ref Range   Glucose-Capillary 106 (H) 70 - 99 mg/dL   Comment 1 Notify RN    Comment 2 Document in Chart   Glucose, capillary     Status: Abnormal   Collection Time: 04/30/22  4:37 PM  Result Value Ref Range   Glucose-Capillary 150 (H) 70 - 99 mg/dL   Comment 1 Notify RN    Comment 2 Document in Chart     I have reviewed pertinent nursing notes, vitals, labs, and images as necessary. I have ordered labwork to follow up on as indicated.  I have reviewed the last notes from staff over past 24 hours. I have discussed patient's care plan and test results with nursing staff, CM/SW, and other staff as appropriate.  Time spent: Greater than 50% of the 55 minute visit was spent in counseling/coordination of care for the patient as laid out in the A&P.   LOS: 1 day   Lewie Chamber, MD [facility] 04/30/2022, 6:15 PM

## 2022-04-30 NOTE — Hospital Course (Addendum)
Michelle Cummings is a 72 year old female with PMH CKD 5, anemia of chronic disease, DM II, glaucoma, HLD, HTN who presented with anorexia and fatigue.  She has been followed closely by nephrology outpatient.  She was sent to the ER from her doctor's office due to worsened kidney failure and in need of initiating dialysis. See below for further A&P

## 2022-04-30 NOTE — Consult Note (Signed)
Hospital Consult    Reason for Consult: Dialysis access Referring Physician: Dr. Kathrene Bongo MRN #:  [id number]  History of Present Illness: This is a 72 y.o. female with history of chronic kidney disease never been on dialysis without previous upper extremity, chest or breast surgery other than the left elbow procedure in the remote past.  She was scheduled for AV fistula today in the OR but presented with uremia and shortness of breath now in need of urgent dialysis access.  She states that currently she is breathing normally on room air.  She is not on blood thinners.  Past Medical History:  Diagnosis Date   Anemia    IDA   Arthritis    Cataracts, bilateral    MD just watching, no surgery as of 04/29/22   Cervical disc disease    Chronic kidney disease    Dr. Antoine Poche yearly for CKD stage II as of 11/26/11   Diabetes mellitus    type 2 - diet controlled, no meds   Dyspnea    with exertion   Eczema    Glaucoma    bilateral   Heart murmur    never has caused any problems per patient on 04/29/22   History of chicken pox    Hyperlipidemia    Hypertension    Seasonal allergies    Thyroid nodule     Past Surgical History:  Procedure Laterality Date   CESAREAN SECTION  01/06/1976   COLONOSCOPY     ORIF ANKLE FRACTURE  11/05/2011   Procedure: Left OPEN REDUCTION INTERNAL FIXATION (ORIF) ANKLE FRACTURE;  Surgeon: Toni Arthurs, MD;  Location: [facility];  Service: Orthopedics;  Laterality: Left;  OPEN REDUCTION INTERNAL FIXATION LEFT LATERAL MALLEOLUS FRACTURE WITH STRESS XRAYS WITH FLOUROSCOPY   ORIF ELBOW FRACTURE  03/05/2011   Procedure: Left OPEN REDUCTION INTERNAL FIXATION (ORIF) ELBOW/OLECRANON FRACTURE;  Surgeon: Sharma Covert, MD;  Location: [facility] OR;  Service: Orthopedics;  Laterality: Left;   THYROIDECTOMY Right 07/20/2012   Procedure: RIGHT THYROID LOBECTOMY WITH ISTHMUSECTOMY;  Surgeon: Serena Colonel, MD;  Location: [facility] OR;  Service: ENT;  Laterality:  Right;   TONSILLECTOMY     TUBAL LIGATION     UPPER GI ENDOSCOPY      Allergies  Allergen Reactions   Shellfish Allergy Itching    Prior to Admission medications   Medication Sig Start Date End Date Taking? Authorizing Provider  albuterol (VENTOLIN HFA) 108 (90 Base) MCG/ACT inhaler Inhale 2 puffs into the lungs every 6 (six) hours as needed for wheezing or shortness of breath. 04/21/22  Yes [name], FNP  amLODipine (NORVASC) 5 MG tablet Take 5 mg by mouth daily. 01/18/22  Yes [provider]  carvedilol (COREG) 25 MG tablet Take 1 tablet (25 mg total) by mouth 2 (two) times daily. Patient taking differently: Take 12.5 mg by mouth 2 (two) times daily. 03/03/22  Yes Dorothyann Peng, MD  hydrALAZINE (APRESOLINE) 25 MG tablet Take 25 mg by mouth daily. 11/04/21  Yes [provider]  latanoprost (XALATAN) 0.005 % ophthalmic solution Place 1 drop into both eyes at bedtime. 09/01/16  Yes [provider]  sodium bicarbonate 650 MG tablet Take 650 mg by mouth daily as needed for heartburn.   Yes [provider]  timolol (TIMOPTIC-XR) 0.5 % ophthalmic gel-forming Place 1 drop into both eyes in the morning.   Yes [provider]  vitamin C (ASCORBIC ACID) 500 MG tablet Take 1,000 mg by mouth daily.  Yes [provider]  Vitamin D, Ergocalciferol, (DRISDOL) 1.25 MG (50000 UNIT) CAPS capsule TAKE 1 CAPSULE BY MOUTH EVERY 7 DAYS Patient taking differently: Take 50,000 Units by mouth every 7 (seven) days. 03/19/22  Yes Dorothyann Peng, MD  ACCU-CHEK GUIDE test strip USE AS DIRECTED TO CHECK BLOOD SUGARS 2 TIMES A DAY DX: E11.22 02/01/20   Dorothyann Peng, MD  Accu-Chek Softclix Lancets lancets USE AS DIRECTED TO CHECK BLOOD SUGARS 2 TIMES A DAY DX: E11.22 02/01/20   Dorothyann Peng, MD  atorvastatin (LIPITOR) 40 MG tablet Take one tablet by mouth daily on Monday through Friday. Patient not taking: Reported on 04/28/2022 02/10/22   Dorothyann Peng, MD   fluticasone [facility]) 50 MCG/ACT nasal spray Place 2 sprays into both nostrils daily. Patient not taking: Reported on 04/28/2022 01/27/22   [name], FNP    Social History   Socioeconomic History   Marital status: Married    Spouse name: Not on file   Number of children: Not on file   Years of education: Not on file   Highest education level: Not on file  Occupational History   Occupation: retired  Tobacco Use   Smoking status: Never   Smokeless tobacco: Never  Vaping Use   Vaping Use: Never used  Substance and Sexual Activity   Alcohol use: No   Drug use: No   Sexual activity: Not Currently    Birth control/protection: Surgical    Comment: tubal ligation  Other Topics Concern   Not on file  Social History Narrative   Not on file   Social Determinants of Health   Financial Resource Strain: Low Risk  (11/20/2021)   Overall Financial Resource Strain (CARDIA)    Difficulty of Paying Living Expenses: Not hard at all  Food Insecurity: No Food Insecurity (11/20/2021)   Hunger Vital Sign    Worried About Running Out of Food in the Last Year: Never true    Ran Out of Food in the Last Year: Never true  Transportation Needs: No Transportation Needs (11/20/2021)   PRAPARE - Administrator, Civil Service (Medical): No    Lack of Transportation (Non-Medical): No  Physical Activity: Sufficiently Active (11/20/2021)   Exercise Vital Sign    Days of Exercise per Week: 4 days    Minutes of Exercise per Session: 60 min  Stress: No Stress Concern Present (11/20/2021)   [employer] of Occupational Health - Occupational Stress Questionnaire    Feeling of Stress : Not at all  Social Connections: Not on file  Intimate Partner Violence: Not At Risk (05/26/2018)   Humiliation, Afraid, Rape, and Kick questionnaire    Fear of Current or Ex-Partner: No    Emotionally Abused: No    Physically Abused: No    Sexually Abused: No     Family History  Problem Relation  Age of Onset   Hypertension Mother    Early death Father    Breast cancer Sister    Healthy Brother     ROS: No complaints  Physical Examination  Vitals:   04/30/22 0638 04/30/22 0700  BP: (!) 149/71 (!) 168/76  Pulse: 71 76  Resp: 19 (!) 25  Temp: 97.8 F (36.6 C)   SpO2: 96% 96%   Body mass index is 25.39 kg/m.  Awake alert oriented Nonlabored respirations Bilateral radial artery pulses are palpable  CBC    Component Value Date/Time   WBC 7.3 04/30/2022 0124   RBC 3.30 (L) 04/30/2022 0124  HGB 9.2 (L) 04/30/2022 0146   HGB 9.9 (L) 10/22/2020 1151   HCT 27.0 (L) 04/30/2022 0146   HCT 30.7 (L) 10/22/2020 1151   PLT 318 04/30/2022 0124   PLT 288 10/22/2020 1151   MCV 84.8 04/30/2022 0124   MCV 85 10/22/2020 1151   MCH 26.4 04/30/2022 0124   MCHC 31.1 04/30/2022 0124   RDW 18.6 (H) 04/30/2022 0124   RDW 13.2 10/22/2020 1151   LYMPHSABS 0.6 (L) 04/29/2022 1843   MONOABS 0.7 04/29/2022 1843   EOSABS 0.0 04/29/2022 1843   BASOSABS 0.0 04/29/2022 1843    BMET    Component Value Date/Time   NA 134 (L) 04/30/2022 0146   NA 132 (A) 01/15/2022 0000   K 5.2 (H) 04/30/2022 0146   CL 103 04/30/2022 0124   CO2 9 (L) 04/30/2022 0124   GLUCOSE 119 (H) 04/30/2022 0124   BUN 209 (H) 04/30/2022 0124   BUN 129 (A) 01/15/2022 0000   CREATININE 17.50 (H) 04/30/2022 0124   CALCIUM 7.0 (L) 04/30/2022 0124   CALCIUM 7.2 (L) 04/20/2022 0940   GFRNONAA 2 (L) 04/30/2022 0124   GFRAA 26 (L) 01/15/2020 1504    COAGS: No results found for: "INR", "PROTIME"   Non-Invasive Vascular Imaging:   No new studies  ASSESSMENT/PLAN: This is a 72 y.o. female planned for AV fistula versus graft today now requiring urgent dialysis.  Plan will be for tunneled dialysis catheter and upper extremity access today in the OR.  I discussed the plan with the patient and updated interventional radiology as well as nephrology.  Juliany Daughety C. Randie Heinz, MD [facility]  [place] Office: [phone] Pager: [phone]

## 2022-04-30 NOTE — Assessment & Plan Note (Signed)
-   Baseline 9 to 10 g/dL - Currently at baseline

## 2022-04-30 NOTE — Assessment & Plan Note (Signed)
-   see acute on CKD5

## 2022-04-30 NOTE — Op Note (Signed)
Patient name: Michelle Cummings MRN: [id number] DOB: [birth year] Sex: female  04/30/2022 Pre-operative Diagnosis: End-stage renal disease  Post-operative diagnosis:  Same Surgeon:  Apolinar Junes C. Randie Heinz, MD Assistant: Doreatha Massed, PA Procedure Performed: 1.  Right IJ 19 cm tunneled dialysis catheter placement with ultrasound fluoroscopic guidance 2.  Creation of left brachial artery to cephalic vein AV fistula with limited left brachial artery endarterectomy  Indications: 72 year old female now with end-stage renal disease was planned for fistula versus graft today but now has presented to the emergency department in need of immediate access and is plan for tunneled dialysis catheter with left arm fistula versus graft.  An experienced assistant was necessary to facilitate exposure.  The brachial artery and cephalic vein and assist with anastomosis.  Findings: Right IJ was very large was easily cannulated and catheter was placed to the SVC atrial junction.  In the left upper extremity there was a median cubital vein this was divided to allow for direct flow via the basilic vein if this is needed in the future.  Initially the cephalic vein was sewn into side of the brachial artery there was minimal flow into the fistula or to the hand fistula was taken down there was noted to be significant plaque with dissection and endarterectomy was performed and a new fistula was performed at completion there was very strong flow through the fistula and a signal at the wrist with a palpable pulse with compression of the fistula.   Procedure:  The patient was identified in the holding area and taken to the operating room where she was placed supine operative table and LMA anesthesia was induced.  She was sterilely prepped and draped in the bilateral neck and chest in usual fashion as well as the left upper extremity, antibiotics were administered timeout was called.  We began using ultrasound to identify the right  internal jugular vein which was cannulated with micropuncture needle followed by wire and a sheath.  We then passed the J-wire centrally a total of 19 cm tunneled catheter from a counterincision.  The wire tract was serially dilated and introducer sheath was placed with fluoroscopic guidance and the catheter was placed to the SVC atrial junction under fluoroscopy.  The catheter was flushed with heparinized saline and affixed to the skin with 3-0 nylon suture and the neck incision was closed with 4-0 Monocryl and Dermabond placed at both sites.  The catheter was then locked with 1.6 cc of concentrated heparin the port.  Attention was turned to the left upper extremity where ultrasound identified both the basilic and cephalic vein which appeared suitable for fistula creation just below the antecubitum.  A transverse incision was created and I dissected out the cephalic vein marked this for orientation dissected through the deep fascia the brachial artery placed a vessel loop around this.  I then transected the cephalic vein just at the median cubital allowing direct flow into the basilic.  This was tied off with 2-0 silk suture.  I flushed the vein with heparinized saline and clamped this.  The artery was clamped distally and proximally opened longitudinally flushed with heparinized saline both directions.  The vein was then sewn into side with 6-0 Prolene suture.  Prior to completion of flushing all directions.  Upon completion there was very minimal flow into the fistula and only monophasic signals in the radial ulnar arteries at the wrist.  With this there was a small branch on the fistula which I opened and then ultimately  transected the fistula and there was minimal inflow.  I took evaluate back to the anastomosis.  There appeared to be an elevated plaque and I performed a limited endarterectomy of the brachial artery.  I spatulated the vein where there was excess vein still enough for fistula creation.  I  flushed the artery with heparinized saline both directions there was good bleeding in both directions.  The vein was again sewn into side with 6-0 Prolene sutures.  Again we allowed flushing with maneuvers in all directions.  Upon completion there was very strong thrill in the cephalic vein and a palpable radial artery pulse at the wrist with compression of the fistula.  Both of these were confirmed with Doppler.  The wound was irrigated hemostasis was obtained we closed in layers with Vicryl and Monocryl.  Dermabond is placed at the skin level.  She was awakened from anesthesia having tolerated the procedure without any complication.  All counts were correct at completion.  EBL: 50 cc    [name], MD [facility] [place] Office: [phone] Pager: [phone]

## 2022-05-01 ENCOUNTER — Encounter (HOSPITAL_COMMUNITY): Payer: Self-pay | Admitting: Vascular Surgery

## 2022-05-01 DIAGNOSIS — N186 End stage renal disease: Secondary | ICD-10-CM | POA: Diagnosis not present

## 2022-05-01 DIAGNOSIS — N179 Acute kidney failure, unspecified: Secondary | ICD-10-CM | POA: Diagnosis not present

## 2022-05-01 DIAGNOSIS — I10 Essential (primary) hypertension: Secondary | ICD-10-CM | POA: Diagnosis not present

## 2022-05-01 DIAGNOSIS — Z992 Dependence on renal dialysis: Secondary | ICD-10-CM | POA: Diagnosis not present

## 2022-05-01 LAB — GLUCOSE, CAPILLARY
Glucose-Capillary: 97 mg/dL (ref 70–99)
Glucose-Capillary: 76 mg/dL (ref 70–99)
Glucose-Capillary: 131 mg/dL — ABNORMAL HIGH (ref 70–99)
Glucose-Capillary: 155 mg/dL — ABNORMAL HIGH (ref 70–99)
Glucose-Capillary: 129 mg/dL — ABNORMAL HIGH (ref 70–99)

## 2022-05-01 LAB — CBC WITH DIFFERENTIAL/PLATELET
Abs Immature Granulocytes: 0.06 10*3/uL (ref 0.00–0.07)
Basophils Absolute: 0 10*3/uL (ref 0.0–0.1)
Basophils Relative: 0 %
Eosinophils Absolute: 0 10*3/uL (ref 0.0–0.5)
Eosinophils Relative: 0 %
HCT: 25.3 % — ABNORMAL LOW (ref 36.0–46.0)
Hemoglobin: 8.5 g/dL — ABNORMAL LOW (ref 12.0–15.0)
Immature Granulocytes: 1 %
Lymphocytes Relative: 9 %
Lymphs Abs: 0.6 10*3/uL — ABNORMAL LOW (ref 0.7–4.0)
MCH: 26.6 pg (ref 26.0–34.0)
MCHC: 33.6 g/dL (ref 30.0–36.0)
MCV: 79.3 fL — ABNORMAL LOW (ref 80.0–100.0)
Monocytes Absolute: 0.8 10*3/uL (ref 0.1–1.0)
Monocytes Relative: 11 %
Neutro Abs: 5.8 10*3/uL (ref 1.7–7.7)
Neutrophils Relative %: 79 %
Platelets: 299 10*3/uL (ref 150–400)
RBC: 3.19 MIL/uL — ABNORMAL LOW (ref 3.87–5.11)
RDW: 18.7 % — ABNORMAL HIGH (ref 11.5–15.5)
WBC: 7.4 10*3/uL (ref 4.0–10.5)
nRBC: 0.7 % — ABNORMAL HIGH (ref 0.0–0.2)

## 2022-05-01 LAB — PTH, INTACT AND CALCIUM
Calcium, Total (PTH): 6.9 mg/dL — CL (ref 8.7–10.3)
PTH: 412 pg/mL — ABNORMAL HIGH (ref 15–65)

## 2022-05-01 LAB — BASIC METABOLIC PANEL
Anion gap: 24 — ABNORMAL HIGH (ref 5–15)
BUN: 127 mg/dL — ABNORMAL HIGH (ref 8–23)
CO2: 16 mmol/L — ABNORMAL LOW (ref 22–32)
Calcium: 7.4 mg/dL — ABNORMAL LOW (ref 8.9–10.3)
Chloride: 99 mmol/L (ref 98–111)
Creatinine, Ser: 11.33 mg/dL — ABNORMAL HIGH (ref 0.44–1.00)
GFR, Estimated: 3 mL/min — ABNORMAL LOW (ref >60)
Glucose, Bld: 82 mg/dL (ref 70–99)
Potassium: 3.4 mmol/L — ABNORMAL LOW (ref 3.5–5.1)
Sodium: 139 mmol/L (ref 135–145)

## 2022-05-01 LAB — MAGNESIUM: Magnesium: 2.1 mg/dL (ref 1.7–2.4)

## 2022-05-01 LAB — HEPATITIS B SURFACE ANTIBODY, QUANTITATIVE: Hep B S AB Quant (Post): <3.5 m[IU]/mL — ABNORMAL LOW (ref >9.9)

## 2022-05-01 MED ORDER — CHLORHEXIDINE GLUCONATE CLOTH 2 % EX PADS
6.0000 | MEDICATED_PAD | Freq: Every day | CUTANEOUS | Status: DC
  Administered 2022-05-02 – 2022-05-03 (×2): 6 via TOPICAL

## 2022-05-01 MED ORDER — DOXERCALCIFEROL 4 MCG/2ML IV SOLN
2.0000 ug | INTRAVENOUS | Status: DC
  Administered 2022-05-02: 2 ug via INTRAVENOUS
  Filled 2022-05-01 (×4): qty 2

## 2022-05-01 MED ORDER — AMLODIPINE BESYLATE 10 MG PO TABS
10.0000 mg | ORAL_TABLET | Freq: Every day | ORAL | Status: DC
  Administered 2022-05-01 – 2022-05-05 (×5): 10 mg via ORAL
  Filled 2022-05-01 (×5): qty 1

## 2022-05-01 MED ORDER — NEPRO/CARBSTEADY PO LIQD
237.0000 mL | ORAL | Status: DC
  Administered 2022-05-01 – 2022-05-04 (×4): 237 mL via ORAL

## 2022-05-01 MED ORDER — POLYETHYLENE GLYCOL 3350 17 G PO PACK: 17.0000 g | PACK | Freq: Every day | ORAL | Status: DC | PRN

## 2022-05-01 MED ORDER — SENNOSIDES-DOCUSATE SODIUM 8.6-50 MG PO TABS: 1.0000 | ORAL_TABLET | Freq: Two times a day (BID) | ORAL | Status: DC | PRN

## 2022-05-01 MED ORDER — DARBEPOETIN ALFA 100 MCG/0.5ML IJ SOSY
100.0000 ug | PREFILLED_SYRINGE | INTRAMUSCULAR | Status: DC
  Administered 2022-05-02: 100 ug via SUBCUTANEOUS
  Filled 2022-05-01: qty 0.5

## 2022-05-01 MED ORDER — CALCIUM ACETATE (PHOS BINDER) 667 MG PO CAPS
1334.0000 mg | ORAL_CAPSULE | Freq: Three times a day (TID) | ORAL | Status: DC
  Administered 2022-05-01 – 2022-05-05 (×9): 1334 mg via ORAL
  Filled 2022-05-01 (×9): qty 2

## 2022-05-01 MED ORDER — RENA-VITE PO TABS
1.0000 | ORAL_TABLET | Freq: Every day | ORAL | Status: DC
  Administered 2022-05-01 – 2022-05-04 (×4): 1 via ORAL
  Filled 2022-05-01 (×4): qty 1

## 2022-05-01 MED ORDER — HYDRALAZINE HCL 25 MG PO TABS: 25.0000 mg | ORAL_TABLET | ORAL | Status: DC | PRN

## 2022-05-01 MED ORDER — LABETALOL HCL 5 MG/ML IV SOLN: 10.0000 mg | INTRAVENOUS | Status: DC | PRN

## 2022-05-01 MED ORDER — HEPARIN SODIUM (PORCINE) 5000 UNIT/ML IJ SOLN
5000.0000 [IU] | Freq: Three times a day (TID) | INTRAMUSCULAR | Status: DC
  Administered 2022-05-01 – 2022-05-03 (×6): 5000 [IU] via SUBCUTANEOUS
  Filled 2022-05-01 (×6): qty 1

## 2022-05-01 NOTE — Progress Notes (Signed)
Subjective: ended up getting HD in the middle of the night -  BUN down to 127 !  She reports feeling a little better today   Objective Vital signs in last 24 hours: Vitals:   05/01/22 0023 05/01/22 0108 05/01/22 0449 05/01/22 0822  BP: (!) 197/69 (!) 188/58 (!) 187/76 (!) 158/110  Pulse: 92 89 78 83  Resp: (!) 22 20 20  (!) 24  Temp: 98.2 F (36.8 C) 98.4 F (36.9 C) 97.8 F (36.6 C) 98.4 F (36.9 C)  TempSrc:  Oral Oral Oral  SpO2: 95% 97% 98% 96%  Weight:      Height:       Weight change: -0.421 kg  Intake/Output Summary (Last 24 hours) at 05/01/2022 1014 Last data filed at 05/01/2022 0359 Gross per 24 hour  Intake 200 ml  Output 25 ml  Net 175 ml    Assessment/ Plan: Pt is a 72 y.o. yo female with stage V CKD who was admitted on 04/29/2022 with uremia and needing to start dialysis Assessment/Plan: 1.  Uremia-this has progressed pretty significantly in the last 6 weeks.  We had planned to place fistula as OP but now need to place a fistula and graft and start dialysis for her uremic symptoms.  S/p [facility] and AVF as well as first dialysis treatment 4/25.  Will look to get her established with an outpatient dialysis unit upon discharge.  Plan for second HD tomorrow 4/27-  have enlisted the help of renal navigator and educator to assist-  will likely need to stay til Monday-  treatment #3 on Monday 2.  Metabolic acidosis-pretty severe.  Has received amps of bicarb but dialysis will help to regulate-  better 3. Anemia-has been on ESA and iron as an outpatient for a long time.  Continue with these treatments 4. Secondary hyperparathyroidism-last PTH 390 and with low calcium will start vitamin D with dialysis.  Patient will also need a phosphate binder, will start with calcium based 5. HTN/volume-continue with home Norvasc, Coreg and hydralazine.  May be able to wean blood pressure medications with dialysis regulating volume status but not yet   [name]    Labs: Basic  Metabolic Panel: Recent Labs  Lab 04/29/22 2250 04/30/22 0124 04/30/22 0146 05/01/22 0130  NA 136 136 134* 139  K 5.4* 5.2* 5.2* 3.4*  CL 104 103  --  99  CO2 10* 9*  --  16*  GLUCOSE 128* 119*  --  82  BUN 208* 209*  --  127*  CREATININE 17.50* 17.50*  --  11.33*  CALCIUM 6.9* 7.0*  --  7.4*  PHOS >30.0* >30.0*  --   --    Liver Function Tests: Recent Labs  Lab 04/29/22 1843 04/29/22 2250 04/30/22 0124  AST 10*  --  11*  ALT 26  --  26  ALKPHOS 74  --  68  BILITOT 0.7  --  0.6  PROT 5.6*  --  5.9*  ALBUMIN 2.8* 2.7* 2.8*   No results for input(s): "LIPASE", "AMYLASE" in the last 168 hours. No results for input(s): "AMMONIA" in the last 168 hours. CBC: Recent Labs  Lab 04/29/22 1843 04/29/22 1857 04/30/22 0124 04/30/22 0146 05/01/22 0130  WBC 6.7  --  7.3  --  7.4  NEUTROABS 5.3  --   --   --  5.8  HGB 9.1*   < > 8.7* 9.2* 8.5*  HCT 28.4*   < > 28.0* 27.0* 25.3*  MCV 84.3  --  84.8  --  79.3*  PLT 331  --  318  --  299   < > = values in this interval not displayed.   Cardiac Enzymes: No results for input(s): "CKTOTAL", "CKMB", "CKMBINDEX", "TROPONINI" in the last 168 hours. CBG: Recent Labs  Lab 04/30/22 1312 04/30/22 1637 04/30/22 2024 05/01/22 0109 05/01/22 0442  GLUCAP 106* 150* 160* 97 76    Iron Studies: No results for input(s): "IRON", "TIBC", "TRANSFERRIN", "FERRITIN" in the last 72 hours. Studies/Results: DG Chest Port 1 View  Result Date: 04/30/2022 CLINICAL DATA:  Status post dialysis catheter insertion EXAM: PORTABLE CHEST 1 VIEW COMPARISON:  X-ray 04/29/2022 FINDINGS: Enlarged cardiopericardial silhouette with some slight central vascular congestion. Question trace edema. Small left effusion with some adjacent more focal opacity. Atelectasis versus infiltrate. No pneumothorax. No right-sided effusion. There is right IJ double-lumen catheter with tip along the central SVC. Diffuse degenerative changes of the spine. IMPRESSION: New right IJ  catheter.  No pneumothorax. Enlarged heart with some central vascular congestion. Question trace edema. Left retrocardiac opacity and small effusion.  Recommend follow-up Electronically Signed   By: Karen Kays M.D.   On: 04/30/2022 15:39   DG C-Arm 1-60 Min-No Report  Result Date: 04/30/2022 Fluoroscopy was utilized by the requesting physician.  No radiographic interpretation.   DG Chest 1 View  Result Date: 04/29/2022 CLINICAL DATA:  Shortness of breath EXAM: CHEST  1 VIEW COMPARISON:  04/21/2022 FINDINGS: Transverse diameter of heart is increased. Central pulmonary vessels are prominent. There are no signs of alveolar pulmonary edema. Small bilateral pleural effusions are seen, more so on the left side with interval increase. Evaluation of lower lung fields for infiltrates is limited by pleural effusions. There is no pneumothorax. IMPRESSION: Cardiomegaly. Central pulmonary vessels are prominent without signs of alveolar pulmonary edema. Small bilateral pleural effusions, more so on the left side with interval increase. Electronically Signed   By: Ernie Avena M.D.   On: 04/29/2022 19:17   Medications: Infusions:  sodium chloride     anticoagulant sodium citrate      Scheduled Medications:  amLODipine  10 mg Oral Daily   carvedilol  12.5 mg Oral BID   Chlorhexidine Gluconate Cloth  6 each Topical Q0600   heparin injection (subcutaneous)  5,000 Units Subcutaneous Q8H   insulin aspart  0-6 Units Subcutaneous Q4H   latanoprost  1 drop Both Eyes QHS   sodium bicarbonate  1,300 mg Oral TID   sodium chloride flush  3 mL Intravenous Q12H   timolol  1 drop Both Eyes q AM    have reviewed scheduled and prn medications.  Physical Exam: General: Actually pretty well-appearing black female in no acute distress but is feeling poorly Heart: Regular rate and rhythm Lungs: Mostly clear Abdomen: Soft, nontender, nondistended Extremities: Trace to 1+ edema Dialysis Access: left AVF-  good  bruit-  right sided TDC    05/01/2022,10:14 AM  LOS: 2 days

## 2022-05-01 NOTE — Progress Notes (Signed)
Received critical from lab of calcium 6.9. this result came from [facility]. Will pass along to days

## 2022-05-01 NOTE — Consult Note (Signed)
   [facility] Inpatient Consult   05/01/2022  DORAL DIGANGI 01-23-1950 [id number]  [facility] [[facility]]  Accountable Care Organization [ACO] Patient: [employer] Medicare   Primary Care Provider: Dorothyann Peng, MD with [facility] which is listed to provide the transition of care follow up.   Patient is currently active with [facility] Care Management for chronic disease management services.  Patient has been engaged by a [facility] Care Coordination.  Our community based plan of care has focused on disease management and community resource support.    Patient will receive a post hospital call and will be evaluated for assessments and disease process education.    Met with patient at the bedside with friend, Dorann Lodge present to explain the reason for the rounding visit for any care needs.  Patient with new HD catheter, in good spirits and states she feels better today. We spoke about [employer] benefits and contacting [facility] RN CC, Angel,  for any needs as well. Patient was given a 24 hour nurse advise line magnet and an appointment reminder card to follow with PCP.  Patient verbalizes understanding.  Plan: Will update the [facility] Care Coordination team of any new post hospital care needs or barriers to care. Will continue to follow for progress with inpatient [facility] team for post hospital needs.  Of note, [facility] Care Management services does not replace or interfere with any services that are needed or arranged by inpatient [facility] care management team.   For additional questions or referrals please contact:  Charlesetta Shanks, RN BSN CCM [place] [facility]  [phone] business mobile phone Toll free office [phone]  *Concierge Line  [phone] Fax number: [phone] [email or web address]

## 2022-05-01 NOTE — Plan of Care (Signed)
  Problem: Health Behavior/Discharge Planning: Goal: Ability to manage health-related needs will improve Outcome: Progressing   

## 2022-05-01 NOTE — Progress Notes (Signed)
Initial Nutrition Assessment  DOCUMENTATION CODES:   Not applicable  INTERVENTION:  Rena-vite  Nepro Shake po daily, each supplement provides 425 kcal and 19 grams protein Education on adequate nutrition  Provided printed handouts on nutrition for dialysis   NUTRITION DIAGNOSIS:   Inadequate oral intake related to chronic illness, decreased appetite as evidenced by per patient/family report.  GOAL:   Patient will meet greater than or equal to 90% of their needs  MONITOR:   PO intake, Labs, I & O's, Weight trends, Supplement acceptance  REASON FOR ASSESSMENT:   Consult Assessment of nutrition requirement/status  ASSESSMENT:   72 y.o. female with PMHx inxluding CKD 5, anemia of chronic disease, T2DM, gluacoma, HLD, HTN who presents with fatigue, anorexia, and need for dialysis initiation  Starting dialysis   Presents with edema and fluid overload  S/p TDC    Labs: K+ 3.4, BUN 127, Cr 11.33, phos >30, GFR 3 Meds: insulin, sodium bicarbonate, NS Wt: 1.8 kg (2.6%) wt loss x 22 days per chart review; RD suspects more weight loss once patient's dry weight is obtained PO: no meals documented at this time  I/O's: +175 ml   Visited patient at bedside who reports poor appetite x 2 weeks now. She reports normally eating at least 2 meals per day. She does the cooking at home.   UBW reported to be ~150#, RD unable to assess weight loss until dry weight is obtained.   Patient reports nausea yesterday, relief from meds.  She denies vomting and diarrhea. Patient has not had a BM yet.   No chewing/swallowing issues reported.   Patient agreeable to vitamin and ONS   NUTRITION - FOCUSED PHYSICAL EXAM:  Flowsheet Row Most Recent Value  Orbital Region No depletion  Upper Arm Region No depletion  Thoracic and Lumbar Region No depletion  Buccal Region No depletion  Temple Region No depletion  Clavicle Bone Region No depletion  Clavicle and Acromion Bone Region No depletion   Scapular Bone Region Unable to assess  Dorsal Hand Mild depletion  Patellar Region No depletion  Anterior Thigh Region No depletion  Posterior Calf Region No depletion  Edema (RD Assessment) None  Hair Reviewed  Eyes Reviewed  Mouth Reviewed  Skin Reviewed  Nails Unable to assess  [nail polish]       Diet Order:   Diet Order             Diet renal with fluid restriction Fluid restriction: 1200 mL Fluid; Room service appropriate? Yes; Fluid consistency: Thin  Diet effective now                   EDUCATION NEEDS:   Education needs have been addressed  Skin:  Skin Assessment: Reviewed RN Assessment  Last BM:  4/24  Height:   Ht Readings from Last 1 Encounters:  04/30/22 5\' 4"  (1.626 m)    Weight:   Wt Readings from Last 1 Encounters:  04/30/22 66.7 kg   BMI:  Body mass index is 25.23 kg/m.  Estimated Nutritional Needs:   Kcal:  1600-1900 kcal  Protein:  85-100 g  Fluid:  1.2 L    Leodis Rains, RDN, LDN  Clinical Nutrition

## 2022-05-01 NOTE — Progress Notes (Addendum)
  Progress Note    05/01/2022 8:05 AM 1 Day Post-Op  Subjective:  no complaints. Denies any chest pain around [facility]. No pain, numbness or weakness of left arm.   Vitals:   05/01/22 0108 05/01/22 0449  BP: (!) 188/58 (!) 187/76  Pulse: 89 78  Resp: 20 20  Temp: 98.4 F (36.9 C) 97.8 F (36.6 C)  SpO2: 97% 98%   Physical Exam: Cardiac:  regular Lungs:  non labored Incisions:  left AC incision is intact and well appearing. Good thrill in fistula. Right IJ TDC dressings clean, dry and intact Extremities:  2+ left radial pulse, left hand warm.5/5 grip strength Neurologic: alert and oriented  CBC    Component Value Date/Time   WBC 7.4 05/01/2022 0130   RBC 3.19 (L) 05/01/2022 0130   HGB 8.5 (L) 05/01/2022 0130   HGB 9.9 (L) 10/22/2020 1151   HCT 25.3 (L) 05/01/2022 0130   HCT 30.7 (L) 10/22/2020 1151   PLT 299 05/01/2022 0130   PLT 288 10/22/2020 1151   MCV 79.3 (L) 05/01/2022 0130   MCV 85 10/22/2020 1151   MCH 26.6 05/01/2022 0130   MCHC 33.6 05/01/2022 0130   RDW 18.7 (H) 05/01/2022 0130   RDW 13.2 10/22/2020 1151   LYMPHSABS 0.6 (L) 05/01/2022 0130   MONOABS 0.8 05/01/2022 0130   EOSABS 0.0 05/01/2022 0130   BASOSABS 0.0 05/01/2022 0130    BMET    Component Value Date/Time   NA 139 05/01/2022 0130   NA 132 (A) 01/15/2022 0000   K 3.4 (L) 05/01/2022 0130   CL 99 05/01/2022 0130   CO2 16 (L) 05/01/2022 0130   GLUCOSE 82 05/01/2022 0130   BUN 127 (H) 05/01/2022 0130   BUN 129 (A) 01/15/2022 0000   CREATININE 11.33 (H) 05/01/2022 0130   CALCIUM 7.4 (L) 05/01/2022 0130   CALCIUM 7.2 (L) 04/20/2022 0940   GFRNONAA 3 (L) 05/01/2022 0130   GFRAA 26 (L) 01/15/2020 1504    INR No results found for: "INR"   Intake/Output Summary (Last 24 hours) at 05/01/2022 0805 Last data filed at 05/01/2022 0359 Gross per 24 hour  Intake 200 ml  Output 25 ml  Net 175 ml     Assessment/Plan:  72 y.o. female is s/p 1.  Right IJ 19 cm tunneled dialysis catheter placement  with ultrasound fluoroscopic guidance 2.  Creation of left brachial artery to cephalic vein AV fistula with limited left brachial artery endarterectomy  1 Day Post-Op   Left AV fistula with good thrill AC incision is intact and well appearing Right IJ TDC dressings clean, dry and intact She is stable for discharge from vascular standpoint She will have follow up in 4-6 weeks with fistula duplex   Graceann Congress, PA-C [facility] [phone] 05/01/2022 8:05 AM  VASCULAR STAFF ADDENDUM: I agree with the above.   Rande Brunt. Lenell Antu, MD [facility] Phone Number: [phone] 05/01/2022 3:12 PM

## 2022-05-01 NOTE — Anesthesia Postprocedure Evaluation (Signed)
Anesthesia Post Note  Patient: Michelle Cummings  Procedure(s) Performed: LEFT ARM CEPHALIC VEIN ARTERIOVENOUS (AV) FISTULA CREATION TDC (Left: Arm Upper) INSERTION OF PALINDROME 19CM DIALYSIS CATHETER (Right: Neck)     Patient location during evaluation: PACU Anesthesia Type: General Level of consciousness: sedated and patient cooperative Pain management: pain level controlled Vital Signs Assessment: post-procedure vital signs reviewed and stable Respiratory status: spontaneous breathing Cardiovascular status: stable Anesthetic complications: no   No notable events documented.  Last Vitals:  Vitals:   05/01/22 0449 05/01/22 0822  BP: (!) 187/76 (!) 158/110  Pulse: 78 83  Resp: 20 (!) 24  Temp: 36.6 C 36.9 C  SpO2: 98% 96%    Last Pain:  Vitals:   05/01/22 1000  TempSrc:   PainSc: 0-No pain                 Lewie Loron

## 2022-05-01 NOTE — Progress Notes (Signed)
Requested to see pt for out-pt HD needs at d/c. Met with pt at bedside and pt gave permissions to speak in front of visitor. Discussed out-pt HD options and pt voices possible interest in TCU at [facility] for HD education for new starts. Clinic is also close to pt's home. Referral submitted to [facility] admissions for TCU for review. Pt states that she has someone that can assist with transportation to/from HD. Will assist as needed.   Olivia Canter Renal Navigator [phone]

## 2022-05-01 NOTE — Care Management Important Message (Signed)
Important Message  Patient Details  Name: Michelle Cummings MRN: [id number] Date of Birth: 1950/06/06   Medicare Important Message Given:  Yes     Renie Ora 05/01/2022, 1:17 PM

## 2022-05-01 NOTE — Progress Notes (Signed)
Progress Note    Michelle Cummings   [id number]  DOB: 1950-04-20  DOA: 04/29/2022     2 PCP: Dorothyann Peng, MD  Initial CC: sent to ER for worsening renal failure  Hospital Course: Michelle Cummings is a 72 year old female with PMH CKD 5, anemia of chronic disease, DM II, glaucoma, HLD, HTN who presented with anorexia and fatigue.  She has been followed closely by nephrology outpatient.  She was sent to the ER from her doctor's office due to worsened kidney failure and in need of initiating dialysis.  Interval History:  Tolerated events of yesterday well.  Also tolerated short session of dialysis yesterday.  Feeling a little better when seen this morning.  Family friend present bedside also.  Had no questions or concerns.  Assessment and Plan: * Acute renal failure superimposed on stage 5 chronic kidney disease, not on chronic dialysis [facility]) - Now progressed to ESRD - Underwent right PermCath placement 4/25 with vascular surgery along with left AV fistula creation - inpatient HD per nephrology and will need outpatient HD chair prior to d/c  ESRD on dialysis [facility]) - see acute on CKD5  HTN (hypertension) - Continue Norvasc, Coreg, hydralazine - Agree, may be able to wean some medications with ultrafiltration from dialysis - BP still above goal; PRN meds added  Anemia of chronic disease - Baseline 9 to 10 g/dL - Currently at baseline  Fluid overload-resolved as of 04/30/2022 - See CKD/ESRD  Glaucoma - Continue eyedrops  HLD (hyperlipidemia) - No longer on Lipitor due to myalgias  Type 2 diabetes mellitus with stage 3 chronic kidney disease (HCC) - Continue SSI and CBG monitoring   Old records reviewed in assessment of this patient  Antimicrobials:   DVT prophylaxis:  heparin injection 5,000 Units Start: 05/01/22 1015 SCDs Start: 04/30/22 0043   Code Status:   Code Status: Full Code  Mobility Assessment (last 72 hours)     Mobility Assessment     Row Name 05/01/22  0900 04/30/22 2130 04/30/22 07:22:37       Does patient have an order for bedrest or is patient medically unstable No - Continue assessment No - Continue assessment No - Continue assessment     What is the highest level of mobility based on the progressive mobility assessment? Level 5 (Walks with assist in room/hall) - Balance while stepping forward/back and can walk in room with assist - Complete Level 5 (Walks with assist in room/hall) - Balance while stepping forward/back and can walk in room with assist - Complete Level 6 (Walks independently in room and hall) - Balance while walking in room without assist - Complete              Barriers to discharge: CLIP Disposition Plan:  home  Status is: Inpt  Objective: Blood pressure (!) 164/94, pulse 81, temperature 98.2 F (36.8 C), temperature source Oral, resp. rate (!) 25, height 5\' 4"  (1.626 m), weight 66.7 kg, SpO2 95 %.  Examination:  Physical Exam Constitutional:      Appearance: Normal appearance.  HENT:     Head: Normocephalic and atraumatic.     Mouth/Throat:     Mouth: Mucous membranes are moist.  Eyes:     Extraocular Movements: Extraocular movements intact.  Cardiovascular:     Rate and Rhythm: Normal rate and regular rhythm.  Pulmonary:     Effort: Pulmonary effort is normal. No respiratory distress.     Breath sounds: Normal breath sounds. No wheezing.  Abdominal:  General: Bowel sounds are normal. There is no distension.     Palpations: Abdomen is soft.     Tenderness: There is no abdominal tenderness.  Musculoskeletal:        General: Normal range of motion.     Cervical back: Normal range of motion and neck supple.  Skin:    General: Skin is warm and dry.  Neurological:     General: No focal deficit present.     Mental Status: She is alert.  Psychiatric:        Mood and Affect: Mood normal.      Consultants:  Nephrology Vascular surgery  Procedures:  04/30/22:  Procedure Performed: 1.  Right  IJ 19 cm tunneled dialysis catheter placement with ultrasound fluoroscopic guidance 2.  Creation of left brachial artery to cephalic vein AV fistula with limited left brachial artery endarterectomy  Data Reviewed: Results for orders placed or performed during the hospital encounter of 04/29/22 (from the past 24 hour(s))  Glucose, capillary     Status: Abnormal   Collection Time: 04/30/22  4:37 PM  Result Value Ref Range   Glucose-Capillary 150 (H) 70 - 99 mg/dL   Comment 1 Notify RN    Comment 2 Document in Chart   Glucose, capillary     Status: Abnormal   Collection Time: 04/30/22  8:24 PM  Result Value Ref Range   Glucose-Capillary 160 (H) 70 - 99 mg/dL  Glucose, capillary     Status: None   Collection Time: 05/01/22  1:09 AM  Result Value Ref Range   Glucose-Capillary 97 70 - 99 mg/dL  Basic metabolic panel     Status: Abnormal   Collection Time: 05/01/22  1:30 AM  Result Value Ref Range   Sodium 139 135 - 145 mmol/L   Potassium 3.4 (L) 3.5 - 5.1 mmol/L   Chloride 99 98 - 111 mmol/L   CO2 16 (L) 22 - 32 mmol/L   Glucose, Bld 82 70 - 99 mg/dL   BUN 784 (H) 8 - 23 mg/dL   Creatinine, Ser 69.62 (H) 0.44 - 1.00 mg/dL   Calcium 7.4 (L) 8.9 - 10.3 mg/dL   GFR, Estimated 3 (L) >60 mL/min   Anion gap 24 (H) 5 - 15  CBC with Differential/Platelet     Status: Abnormal   Collection Time: 05/01/22  1:30 AM  Result Value Ref Range   WBC 7.4 4.0 - 10.5 K/uL   RBC 3.19 (L) 3.87 - 5.11 MIL/uL   Hemoglobin 8.5 (L) 12.0 - 15.0 g/dL   HCT 95.2 (L) 84.1 - 32.4 %   MCV 79.3 (L) 80.0 - 100.0 fL   MCH 26.6 26.0 - 34.0 pg   MCHC 33.6 30.0 - 36.0 g/dL   RDW 40.1 (H) 02.7 - 25.3 %   Platelets 299 150 - 400 K/uL   nRBC 0.7 (H) 0.0 - 0.2 %   Neutrophils Relative % 79 %   Neutro Abs 5.8 1.7 - 7.7 K/uL   Lymphocytes Relative 9 %   Lymphs Abs 0.6 (L) 0.7 - 4.0 K/uL   Monocytes Relative 11 %   Monocytes Absolute 0.8 0.1 - 1.0 K/uL   Eosinophils Relative 0 %   Eosinophils Absolute 0.0 0.0 - 0.5  K/uL   Basophils Relative 0 %   Basophils Absolute 0.0 0.0 - 0.1 K/uL   Immature Granulocytes 1 %   Abs Immature Granulocytes 0.06 0.00 - 0.07 K/uL  Magnesium     Status: None  Collection Time: 05/01/22  1:30 AM  Result Value Ref Range   Magnesium 2.1 1.7 - 2.4 mg/dL  Glucose, capillary     Status: None   Collection Time: 05/01/22  4:42 AM  Result Value Ref Range   Glucose-Capillary 76 70 - 99 mg/dL  Glucose, capillary     Status: Abnormal   Collection Time: 05/01/22 11:26 AM  Result Value Ref Range   Glucose-Capillary 131 (H) 70 - 99 mg/dL    I have reviewed pertinent nursing notes, vitals, labs, and images as necessary. I have ordered labwork to follow up on as indicated.  I have reviewed the last notes from staff over past 24 hours. I have discussed patient's care plan and test results with nursing staff, CM/SW, and other staff as appropriate.  Time spent: Greater than 50% of the 55 minute visit was spent in counseling/coordination of care for the patient as laid out in the A&P.   LOS: 2 days   Lewie Chamber, MD [facility] 05/01/2022, 3:47 PM

## 2022-05-02 DIAGNOSIS — N186 End stage renal disease: Secondary | ICD-10-CM | POA: Diagnosis not present

## 2022-05-02 DIAGNOSIS — I480 Paroxysmal atrial fibrillation: Secondary | ICD-10-CM

## 2022-05-02 DIAGNOSIS — Z992 Dependence on renal dialysis: Secondary | ICD-10-CM | POA: Diagnosis not present

## 2022-05-02 DIAGNOSIS — R000 Tachycardia, unspecified: Secondary | ICD-10-CM | POA: Insufficient documentation

## 2022-05-02 DIAGNOSIS — N179 Acute kidney failure, unspecified: Secondary | ICD-10-CM | POA: Diagnosis not present

## 2022-05-02 LAB — CBC WITH DIFFERENTIAL/PLATELET
Abs Immature Granulocytes: 0.05 10*3/uL (ref 0.00–0.07)
Basophils Absolute: 0 10*3/uL (ref 0.0–0.1)
Basophils Relative: 0 %
Eosinophils Absolute: 0 10*3/uL (ref 0.0–0.5)
Eosinophils Relative: 0 %
HCT: 25.8 % — ABNORMAL LOW (ref 36.0–46.0)
Hemoglobin: 8.3 g/dL — ABNORMAL LOW (ref 12.0–15.0)
Immature Granulocytes: 1 %
Lymphocytes Relative: 10 %
Lymphs Abs: 0.7 10*3/uL (ref 0.7–4.0)
MCH: 26.6 pg (ref 26.0–34.0)
MCHC: 32.2 g/dL (ref 30.0–36.0)
MCV: 82.7 fL (ref 80.0–100.0)
Monocytes Absolute: 1 10*3/uL (ref 0.1–1.0)
Monocytes Relative: 14 %
Neutro Abs: 5.3 10*3/uL (ref 1.7–7.7)
Neutrophils Relative %: 75 %
Platelets: 286 10*3/uL (ref 150–400)
RBC: 3.12 MIL/uL — ABNORMAL LOW (ref 3.87–5.11)
RDW: 18.8 % — ABNORMAL HIGH (ref 11.5–15.5)
WBC: 7 10*3/uL (ref 4.0–10.5)
nRBC: 0.6 % — ABNORMAL HIGH (ref 0.0–0.2)

## 2022-05-02 LAB — RENAL FUNCTION PANEL
Albumin: 2.3 g/dL — ABNORMAL LOW (ref 3.5–5.0)
Anion gap: 18 — ABNORMAL HIGH (ref 5–15)
BUN: 142 mg/dL — ABNORMAL HIGH (ref 8–23)
CO2: 19 mmol/L — ABNORMAL LOW (ref 22–32)
Calcium: 7.1 mg/dL — ABNORMAL LOW (ref 8.9–10.3)
Chloride: 100 mmol/L (ref 98–111)
Creatinine, Ser: 13.04 mg/dL — ABNORMAL HIGH (ref 0.44–1.00)
GFR, Estimated: 3 mL/min — ABNORMAL LOW (ref >60)
Glucose, Bld: 100 mg/dL — ABNORMAL HIGH (ref 70–99)
Phosphorus: 11.8 mg/dL — ABNORMAL HIGH (ref 2.5–4.6)
Potassium: 4.1 mmol/L (ref 3.5–5.1)
Sodium: 137 mmol/L (ref 135–145)

## 2022-05-02 LAB — GLUCOSE, CAPILLARY
Glucose-Capillary: 108 mg/dL — ABNORMAL HIGH (ref 70–99)
Glucose-Capillary: 123 mg/dL — ABNORMAL HIGH (ref 70–99)
Glucose-Capillary: 134 mg/dL — ABNORMAL HIGH (ref 70–99)
Glucose-Capillary: 148 mg/dL — ABNORMAL HIGH (ref 70–99)
Glucose-Capillary: 163 mg/dL — ABNORMAL HIGH (ref 70–99)
Glucose-Capillary: 96 mg/dL (ref 70–99)

## 2022-05-02 LAB — MAGNESIUM: Magnesium: 2.3 mg/dL (ref 1.7–2.4)

## 2022-05-02 MED ORDER — CARVEDILOL 25 MG PO TABS
25.0000 mg | ORAL_TABLET | Freq: Two times a day (BID) | ORAL | Status: DC
  Administered 2022-05-02 – 2022-05-05 (×6): 25 mg via ORAL
  Filled 2022-05-02 (×6): qty 1

## 2022-05-02 MED ORDER — METOPROLOL TARTRATE 5 MG/5ML IV SOLN
5.0000 mg | INTRAVENOUS | Status: DC | PRN
  Administered 2022-05-02: 5 mg via INTRAVENOUS
  Filled 2022-05-02: qty 5

## 2022-05-02 NOTE — Plan of Care (Signed)
  Problem: Metabolic: Goal: Ability to maintain appropriate glucose levels will improve Outcome: Progressing   Problem: Nutritional: Goal: Maintenance of adequate nutrition will improve Outcome: Progressing   

## 2022-05-02 NOTE — Plan of Care (Signed)
  Problem: Metabolic: Goal: Ability to maintain appropriate glucose levels will improve Outcome: Progressing   

## 2022-05-02 NOTE — Progress Notes (Signed)
Progress Note    Michelle Cummings   [id number]  DOB: [birth year]  DOA: 04/29/2022     3 PCP: Dorothyann Peng, MD  Initial CC: sent to ER for worsening renal failure  Hospital Course: Ms. Landenberger is a 72 year old female with PMH CKD 5, anemia of chronic disease, DM II, glaucoma, HLD, HTN who presented with anorexia and fatigue.  She has been followed closely by nephrology outpatient.  She was sent to the ER from her doctor's office due to worsened kidney failure and in need of initiating dialysis.  Interval History:  No events overnight.  Seen this morning after returning from dialysis.  Some lethargy but otherwise feeling okay.  Later in the afternoon, patient developed tachycardia on telemetry.  EKG revealed new onset A-fib with RVR.  IV Lopressor started and Coreg dose also increased.  Patient was asymptomatic with no chest pain or shortness of breath.  Assessment and Plan: * Acute renal failure superimposed on stage 5 chronic kidney disease, not on chronic dialysis [facility]) - Now progressed to ESRD - Underwent right PermCath placement 4/25 with vascular surgery along with left AV fistula creation - inpatient HD per nephrology and will need outpatient HD chair prior to d/c  Paroxysmal atrial fibrillation with RVR (HCC) - Patient converted into A-fib with RVR afternoon of 05/02/2022.  No prior known history of A-fib.  Suspect this is precipitated in setting of acute illness - Holding off on anticoagulation at this time and will focus on rate control for now - Already on Coreg at home - Start IV Lopressor PRN and monitor response - Repeat EKG once rate lower - she is asymptomatic  ESRD on dialysis [facility]) - see acute on CKD5  Anemia of chronic disease - Baseline 9 to 10 g/dL - Currently at baseline  HTN (hypertension) - Continue Norvasc, Coreg, hydralazine - Agree, may be able to wean some medications with ultrafiltration from dialysis - BP still above goal; PRN meds added  Fluid  overload-resolved as of 04/30/2022 - See CKD/ESRD  Glaucoma - Continue eyedrops  HLD (hyperlipidemia) - No longer on Lipitor due to myalgias  Type 2 diabetes mellitus with stage 3 chronic kidney disease (HCC) - Continue SSI and CBG monitoring   Old records reviewed in assessment of this patient  Antimicrobials:   DVT prophylaxis:  heparin injection 5,000 Units Start: 05/01/22 1015 SCDs Start: 04/30/22 0043   Code Status:   Code Status: Full Code  Mobility Assessment (last 72 hours)     Mobility Assessment     Row Name 05/02/22 0800 05/01/22 2000 05/01/22 0900 04/30/22 2130 04/30/22 07:22:37   Does patient have an order for bedrest or is patient medically unstable No - Continue assessment No - Continue assessment No - Continue assessment No - Continue assessment No - Continue assessment   What is the highest level of mobility based on the progressive mobility assessment? Level 5 (Walks with assist in room/hall) - Balance while stepping forward/back and can walk in room with assist - Complete Level 5 (Walks with assist in room/hall) - Balance while stepping forward/back and can walk in room with assist - Complete Level 5 (Walks with assist in room/hall) - Balance while stepping forward/back and can walk in room with assist - Complete Level 5 (Walks with assist in room/hall) - Balance while stepping forward/back and can walk in room with assist - Complete Level 6 (Walks independently in room and hall) - Balance while walking in room without assist - Complete  Barriers to discharge: Awaiting outpatient dialysis set up Disposition Plan:  home  Status is: Inpt  Objective: Blood pressure (!) 146/79, pulse 93, temperature 98 F (36.7 C), temperature source Oral, resp. rate 19, height 5\' 4"  (1.626 m), weight 66.2 kg, SpO2 95 %.  Examination:  Physical Exam Constitutional:      Appearance: Normal appearance.  HENT:     Head: Normocephalic and atraumatic.      Mouth/Throat:     Mouth: Mucous membranes are moist.  Eyes:     Extraocular Movements: Extraocular movements intact.  Cardiovascular:     Rate and Rhythm: Normal rate and regular rhythm.  Pulmonary:     Effort: Pulmonary effort is normal. No respiratory distress.     Breath sounds: Normal breath sounds. No wheezing.  Abdominal:     General: Bowel sounds are normal. There is no distension.     Palpations: Abdomen is soft.     Tenderness: There is no abdominal tenderness.  Musculoskeletal:        General: Normal range of motion.     Cervical back: Normal range of motion and neck supple.  Skin:    General: Skin is warm and dry.  Neurological:     General: No focal deficit present.     Mental Status: She is alert.  Psychiatric:        Mood and Affect: Mood normal.      Consultants:  Nephrology Vascular surgery  Procedures:  04/30/22:  Procedure Performed: 1.  Right IJ 19 cm tunneled dialysis catheter placement with ultrasound fluoroscopic guidance 2.  Creation of left brachial artery to cephalic vein AV fistula with limited left brachial artery endarterectomy  Data Reviewed: Results for orders placed or performed during the hospital encounter of 04/29/22 (from the past 24 hour(s))  Glucose, capillary     Status: Abnormal   Collection Time: 05/01/22  8:19 PM  Result Value Ref Range   Glucose-Capillary 129 (H) 70 - 99 mg/dL  Glucose, capillary     Status: Abnormal   Collection Time: 05/01/22 11:59 PM  Result Value Ref Range   Glucose-Capillary 123 (H) 70 - 99 mg/dL  CBC with Differential/Platelet     Status: Abnormal   Collection Time: 05/02/22  1:11 AM  Result Value Ref Range   WBC 7.0 4.0 - 10.5 K/uL   RBC 3.12 (L) 3.87 - 5.11 MIL/uL   Hemoglobin 8.3 (L) 12.0 - 15.0 g/dL   HCT 16.1 (L) 09.6 - 04.5 %   MCV 82.7 80.0 - 100.0 fL   MCH 26.6 26.0 - 34.0 pg   MCHC 32.2 30.0 - 36.0 g/dL   RDW 40.9 (H) 81.1 - 91.4 %   Platelets 286 150 - 400 K/uL   nRBC 0.6 (H) 0.0 - 0.2  %   Neutrophils Relative % 75 %   Neutro Abs 5.3 1.7 - 7.7 K/uL   Lymphocytes Relative 10 %   Lymphs Abs 0.7 0.7 - 4.0 K/uL   Monocytes Relative 14 %   Monocytes Absolute 1.0 0.1 - 1.0 K/uL   Eosinophils Relative 0 %   Eosinophils Absolute 0.0 0.0 - 0.5 K/uL   Basophils Relative 0 %   Basophils Absolute 0.0 0.0 - 0.1 K/uL   Immature Granulocytes 1 %   Abs Immature Granulocytes 0.05 0.00 - 0.07 K/uL  Magnesium     Status: None   Collection Time: 05/02/22  1:11 AM  Result Value Ref Range   Magnesium 2.3 1.7 - 2.4 mg/dL  Renal function panel     Status: Abnormal   Collection Time: 05/02/22  1:11 AM  Result Value Ref Range   Sodium 137 135 - 145 mmol/L   Potassium 4.1 3.5 - 5.1 mmol/L   Chloride 100 98 - 111 mmol/L   CO2 19 (L) 22 - 32 mmol/L   Glucose, Bld 100 (H) 70 - 99 mg/dL   BUN 161 (H) 8 - 23 mg/dL   Creatinine, Ser 09.60 (H) 0.44 - 1.00 mg/dL   Calcium 7.1 (L) 8.9 - 10.3 mg/dL   Phosphorus 45.4 (H) 2.5 - 4.6 mg/dL   Albumin 2.3 (L) 3.5 - 5.0 g/dL   GFR, Estimated 3 (L) >60 mL/min   Anion gap 18 (H) 5 - 15  Glucose, capillary     Status: Abnormal   Collection Time: 05/02/22  4:08 AM  Result Value Ref Range   Glucose-Capillary 108 (H) 70 - 99 mg/dL  Glucose, capillary     Status: Abnormal   Collection Time: 05/02/22  7:41 AM  Result Value Ref Range   Glucose-Capillary 134 (H) 70 - 99 mg/dL   Comment 1 Notify RN   Glucose, capillary     Status: None   Collection Time: 05/02/22 12:46 PM  Result Value Ref Range   Glucose-Capillary 96 70 - 99 mg/dL  Glucose, capillary     Status: Abnormal   Collection Time: 05/02/22  4:11 PM  Result Value Ref Range   Glucose-Capillary 148 (H) 70 - 99 mg/dL   Comment 1 Notify RN     I have reviewed pertinent nursing notes, vitals, labs, and images as necessary. I have ordered labwork to follow up on as indicated.  I have reviewed the last notes from staff over past 24 hours. I have discussed patient's care plan and test results with  nursing staff, CM/SW, and other staff as appropriate.  Time spent: Greater than 50% of the 55 minute visit was spent in counseling/coordination of care for the patient as laid out in the A&P.   LOS: 3 days   Lewie Chamber, MD [facility] 05/02/2022, 4:42 PM

## 2022-05-02 NOTE — Procedures (Signed)
Patient was seen on dialysis and the procedure was supervised.  BFR 250  Via TDC BP is  159/62.   Patient appears to be tolerating treatment well  [name] 05/02/2022

## 2022-05-02 NOTE — Progress Notes (Addendum)
   05/02/22 1137  Vitals  Temp 98.2 F (36.8 C)  Temp Source Oral  BP 133/67  MAP (mmHg) 87  BP Location Right Arm  BP Method Automatic  Patient Position (if appropriate) Lying  Pulse Rate 83  Pulse Rate Source Monitor  ECG Heart Rate 89  Resp (!) 26  Oxygen Therapy  SpO2 96 %  O2 Device Room Air  Height and Weight  Weight 66.2 kg  Type of Scale Used Bed  Type of Weight Post-Dialysis  BMI (Calculated) 25.04  MEWS Score  MEWS Temp 0  MEWS Systolic 0  MEWS Pulse 0  MEWS RR 2  MEWS LOC 0  MEWS Score 2  MEWS Score Color Yellow   Received patient in bed to unit.  Alert and oriented.  Informed consent signed and in chart.   TX duration:3 hours  Patient tolerated well.  Transported back to the room  Alert, without acute distress.  Hand-off given to patient's nurse.  RN made aware of HR going up to 130-140's not sustained. Pt UF stopped briefly d/t cramping, still able to remove all fluid during treatment.   Access used: R Cath Access issues: none  Total UF removed: Medication(s) given: Hectorol 2 mcg IVP Post HD VS: see attached  Post HD weight: 66.2kgs   [facility]

## 2022-05-02 NOTE — Assessment & Plan Note (Signed)
-   Patient developed a tachyarrhythmia initially thought to be A-fib.  After further cardiology evaluation, this is suspected SVT versus atypical atrial flutter. No prior known history of A-fib or arrhythmias.  Suspect this is precipitated in setting of acute illness and/or pericardial effusion/pericarditis - Already on Coreg at home; dose increased given ongoing RVR - cardiology consulted given recurrent afib with mild RVR -Remains rhythm unaware -Patient recommended to continue on Eliquis at discharge per cardiology with outpatient follow-up and possible discontinuation if remaining sinus

## 2022-05-02 NOTE — Progress Notes (Signed)
Subjective:  seen in HD for her second treatment-  no c/o's-  nausea better-  has been moving around room-  says that she got a call from [facility]-  will be going there-  have not heard for sure from our end   Objective Vital signs in last 24 hours: Vitals:   05/02/22 0816 05/02/22 0830 05/02/22 0900 05/02/22 0930  BP: (!) 176/61 (!) 168/68 (!) 163/90 (!) 159/62  Pulse: 87 79 81 84  Resp: 15 19 16 20   Temp: 98.6 F (37 C)     TempSrc:      SpO2: 92%  94%   Weight: 67.1 kg     Height:       Weight change:   Intake/Output Summary (Last 24 hours) at 05/02/2022 0953 Last data filed at 05/02/2022 1610 Gross per 24 hour  Intake 0 ml  Output --  Net 0 ml    Assessment/ Plan: Pt is a 72 y.o. yo female with stage V CKD who was admitted on 04/29/2022 with uremia and needing to start dialysis Assessment/Plan: 1.  Uremia due to now ESRD-this has progressed pretty significantly in the last 6 weeks.  We had planned to place fistula as OP but now need to place a fistula and graft and start dialysis for her uremic symptoms.  S/p [facility] and AVF as well as first dialysis treatment 4/25.  Will look to get her established with an outpatient dialysis unit upon discharge.  Plan for second HD tomorrow 4/27-  have enlisted the help of renal navigator and educator to assist-  will likely need to stay til Monday-  treatment #3 on Monday 2.  Metabolic acidosis-pretty severe.  Has received amps of bicarb but dialysis will help to regulate-  better 3. Anemia-has been on ESA and iron as an outpatient for a long time.  Continue with these treatments 4. Secondary hyperparathyroidism-last PTH 390 and with low calcium -  started vitamin D with dialysis.  Patient will also need a phosphate binder, will start with calcium based- phos improved 5. HTN/volume-continue with home Norvasc, Coreg and hydralazine.  May be able to wean blood pressure medications with dialysis regulating volume status but not yet   [name]    Labs: Basic Metabolic Panel: Recent Labs  Lab 04/29/22 2250 04/30/22 0124 04/30/22 0146 05/01/22 0130 05/02/22 0111  NA 136 136 134* 139 137  K 5.4* 5.2* 5.2* 3.4* 4.1  CL 104 103  --  99 100  CO2 10* 9*  --  16* 19*  GLUCOSE 128* 119*  --  82 100*  BUN 208* 209*  --  127* 142*  CREATININE 17.50* 17.50*  --  11.33* 13.04*  CALCIUM 6.9* 7.0*  6.9*  --  7.4* 7.1*  PHOS >30.0* >30.0*  --   --  11.8*   Liver Function Tests: Recent Labs  Lab 04/29/22 1843 04/29/22 2250 04/30/22 0124 05/02/22 0111  AST 10*  --  11*  --   ALT 26  --  26  --   ALKPHOS 74  --  68  --   BILITOT 0.7  --  0.6  --   PROT 5.6*  --  5.9*  --   ALBUMIN 2.8* 2.7* 2.8* 2.3*   No results for input(s): "LIPASE", "AMYLASE" in the last 168 hours. No results for input(s): "AMMONIA" in the last 168 hours. CBC: Recent Labs  Lab 04/29/22 1843 04/29/22 1857 04/30/22 0124 04/30/22 0146 05/01/22 0130 05/02/22 0111  WBC  6.7  --  7.3  --  7.4 7.0  NEUTROABS 5.3  --   --   --  5.8 5.3  HGB 9.1*   < > 8.7* 9.2* 8.5* 8.3*  HCT 28.4*   < > 28.0* 27.0* 25.3* 25.8*  MCV 84.3  --  84.8  --  79.3* 82.7  PLT 331  --  318  --  299 286   < > = values in this interval not displayed.   Cardiac Enzymes: No results for input(s): "CKTOTAL", "CKMB", "CKMBINDEX", "TROPONINI" in the last 168 hours. CBG: Recent Labs  Lab 05/01/22 1617 05/01/22 2019 05/01/22 2359 05/02/22 0408 05/02/22 0741  GLUCAP 155* 129* 123* 108* 134*    Iron Studies: No results for input(s): "IRON", "TIBC", "TRANSFERRIN", "FERRITIN" in the last 72 hours. Studies/Results: DG Chest Port 1 View  Result Date: 04/30/2022 CLINICAL DATA:  Status post dialysis catheter insertion EXAM: PORTABLE CHEST 1 VIEW COMPARISON:  X-ray 04/29/2022 FINDINGS: Enlarged cardiopericardial silhouette with some slight central vascular congestion. Question trace edema. Small left effusion with some adjacent more focal opacity. Atelectasis versus  infiltrate. No pneumothorax. No right-sided effusion. There is right IJ double-lumen catheter with tip along the central SVC. Diffuse degenerative changes of the spine. IMPRESSION: New right IJ catheter.  No pneumothorax. Enlarged heart with some central vascular congestion. Question trace edema. Left retrocardiac opacity and small effusion.  Recommend follow-up Electronically Signed   By: Karen Kays M.D.   On: 04/30/2022 15:39   DG C-Arm 1-60 Min-No Report  Result Date: 04/30/2022 Fluoroscopy was utilized by the requesting physician.  No radiographic interpretation.   Medications: Infusions:  sodium chloride     anticoagulant sodium citrate      Scheduled Medications:  amLODipine  10 mg Oral Daily   calcium acetate  1,334 mg Oral TID WC   carvedilol  12.5 mg Oral BID   Chlorhexidine Gluconate Cloth  6 each Topical Q0600   darbepoetin (ARANESP) injection - DIALYSIS  100 mcg Subcutaneous Q Sat-1800   doxercalciferol  2 mcg Intravenous Q T,Th,Sa-HD   feeding supplement (NEPRO CARB STEADY)  237 mL Oral Q24H   heparin injection (subcutaneous)  5,000 Units Subcutaneous Q8H   insulin aspart  0-6 Units Subcutaneous Q4H   latanoprost  1 drop Both Eyes QHS   multivitamin  1 tablet Oral QHS   sodium bicarbonate  1,300 mg Oral TID   sodium chloride flush  3 mL Intravenous Q12H   timolol  1 drop Both Eyes q AM    have reviewed scheduled and prn medications.  Physical Exam: General: Actually pretty well-appearing black female in no acute distress  Heart: Regular rate and rhythm Lungs: Mostly clear Abdomen: Soft, nontender, nondistended Extremities: Trace to 1+ edema Dialysis Access: left AVF-  good bruit-  right sided TDC    05/02/2022,9:53 AM  LOS: 3 days

## 2022-05-03 DIAGNOSIS — I480 Paroxysmal atrial fibrillation: Secondary | ICD-10-CM | POA: Diagnosis not present

## 2022-05-03 DIAGNOSIS — N179 Acute kidney failure, unspecified: Secondary | ICD-10-CM | POA: Diagnosis not present

## 2022-05-03 DIAGNOSIS — Z992 Dependence on renal dialysis: Secondary | ICD-10-CM | POA: Diagnosis not present

## 2022-05-03 DIAGNOSIS — N186 End stage renal disease: Secondary | ICD-10-CM | POA: Diagnosis not present

## 2022-05-03 LAB — RENAL FUNCTION PANEL
Albumin: 2.2 g/dL — ABNORMAL LOW (ref 3.5–5.0)
Anion gap: 17 — ABNORMAL HIGH (ref 5–15)
BUN: 79 mg/dL — ABNORMAL HIGH (ref 8–23)
CO2: 23 mmol/L (ref 22–32)
Calcium: 7.3 mg/dL — ABNORMAL LOW (ref 8.9–10.3)
Chloride: 95 mmol/L — ABNORMAL LOW (ref 98–111)
Creatinine, Ser: 8.21 mg/dL — ABNORMAL HIGH (ref 0.44–1.00)
GFR, Estimated: 5 mL/min — ABNORMAL LOW (ref >60)
Glucose, Bld: 150 mg/dL — ABNORMAL HIGH (ref 70–99)
Phosphorus: 7 mg/dL — ABNORMAL HIGH (ref 2.5–4.6)
Potassium: 4.4 mmol/L (ref 3.5–5.1)
Sodium: 135 mmol/L (ref 135–145)

## 2022-05-03 LAB — CBC WITH DIFFERENTIAL/PLATELET
Abs Immature Granulocytes: 0.12 10*3/uL — ABNORMAL HIGH (ref 0.00–0.07)
Basophils Absolute: 0 10*3/uL (ref 0.0–0.1)
Basophils Relative: 0 %
Eosinophils Absolute: 0 10*3/uL (ref 0.0–0.5)
Eosinophils Relative: 0 %
HCT: 26.8 % — ABNORMAL LOW (ref 36.0–46.0)
Hemoglobin: 8.5 g/dL — ABNORMAL LOW (ref 12.0–15.0)
Immature Granulocytes: 1 %
Lymphocytes Relative: 7 %
Lymphs Abs: 0.7 10*3/uL (ref 0.7–4.0)
MCH: 26.6 pg (ref 26.0–34.0)
MCHC: 31.7 g/dL (ref 30.0–36.0)
MCV: 84 fL (ref 80.0–100.0)
Monocytes Absolute: 1.4 10*3/uL — ABNORMAL HIGH (ref 0.1–1.0)
Monocytes Relative: 15 %
Neutro Abs: 7 10*3/uL (ref 1.7–7.7)
Neutrophils Relative %: 77 %
Platelets: 221 10*3/uL (ref 150–400)
RBC: 3.19 MIL/uL — ABNORMAL LOW (ref 3.87–5.11)
RDW: 18.6 % — ABNORMAL HIGH (ref 11.5–15.5)
WBC: 9.1 10*3/uL (ref 4.0–10.5)
nRBC: 0.3 % — ABNORMAL HIGH (ref 0.0–0.2)

## 2022-05-03 LAB — GLUCOSE, CAPILLARY
Glucose-Capillary: 113 mg/dL — ABNORMAL HIGH (ref 70–99)
Glucose-Capillary: 117 mg/dL — ABNORMAL HIGH (ref 70–99)
Glucose-Capillary: 131 mg/dL — ABNORMAL HIGH (ref 70–99)
Glucose-Capillary: 136 mg/dL — ABNORMAL HIGH (ref 70–99)
Glucose-Capillary: 142 mg/dL — ABNORMAL HIGH (ref 70–99)
Glucose-Capillary: 146 mg/dL — ABNORMAL HIGH (ref 70–99)
Glucose-Capillary: 205 mg/dL — ABNORMAL HIGH (ref 70–99)

## 2022-05-03 LAB — MAGNESIUM: Magnesium: 2 mg/dL (ref 1.7–2.4)

## 2022-05-03 MED ORDER — CHLORHEXIDINE GLUCONATE CLOTH 2 % EX PADS
6.0000 | MEDICATED_PAD | Freq: Every day | CUTANEOUS | Status: DC
  Administered 2022-05-03 – 2022-05-05 (×3): 6 via TOPICAL

## 2022-05-03 MED ORDER — AMIODARONE LOAD VIA INFUSION
150.0000 mg | Freq: Once | INTRAVENOUS | Status: AC
  Administered 2022-05-03: 150 mg via INTRAVENOUS
  Filled 2022-05-03: qty 83.34

## 2022-05-03 MED ORDER — APIXABAN 5 MG PO TABS
5.0000 mg | ORAL_TABLET | Freq: Two times a day (BID) | ORAL | Status: DC
  Administered 2022-05-03 (×2): 5 mg via ORAL
  Filled 2022-05-03 (×2): qty 1

## 2022-05-03 MED ORDER — AMIODARONE HCL IN DEXTROSE 360-4.14 MG/200ML-% IV SOLN
30.0000 mg/h | INTRAVENOUS | Status: DC
  Administered 2022-05-03 (×2): 30 mg/h via INTRAVENOUS
  Filled 2022-05-03: qty 200

## 2022-05-03 MED ORDER — LIP MEDEX EX OINT
TOPICAL_OINTMENT | CUTANEOUS | Status: DC | PRN
  Filled 2022-05-03: qty 7

## 2022-05-03 MED ORDER — METOPROLOL TARTRATE 5 MG/5ML IV SOLN: 5.0000 mg | INTRAVENOUS | Status: DC | PRN

## 2022-05-03 MED ORDER — AMIODARONE HCL IN DEXTROSE 360-4.14 MG/200ML-% IV SOLN
60.0000 mg/h | INTRAVENOUS | Status: AC
  Administered 2022-05-03 (×2): 60 mg/h via INTRAVENOUS
  Filled 2022-05-03 (×2): qty 200

## 2022-05-03 NOTE — Progress Notes (Signed)
Subjective:   s/p HD #2 yest-  removed 1 liter-  feeling better still   Objective Vital signs in last 24 hours: Vitals:   05/02/22 2100 05/02/22 2200 05/03/22 0016 05/03/22 0458  BP:    138/63  Pulse:   76 78  Resp: 20 (!) 21  18  Temp:   99 F (37.2 C) 98.6 F (37 C)  TempSrc:   Oral Oral  SpO2:   95% 94%  Weight:      Height:       Weight change:   Intake/Output Summary (Last 24 hours) at 05/03/2022 0751 Last data filed at 05/03/2022 0459 Gross per 24 hour  Intake 520 ml  Output 1000 ml  Net -480 ml    Assessment/ Plan: Pt is a 72 y.o. yo female with stage V CKD who was admitted on 04/29/2022 with uremia and needing to start dialysis Assessment/Plan: 1.  Uremia due to now ESRD-this has progressed pretty significantly in the last 6 weeks.  We had planned to place fistula as OP but now needed to place a fistula and TDC to start dialysis for her uremic symptoms.  S/p [facility] and AVF as well as first dialysis treatment 4/25, second tx 4/27.  establishing with an outpatient dialysis unit upon discharge.    treatment #3 on Monday.  She had already received a call from [facility] so feel placement will be soon-  will plan for HD #3 in AM for probable discharge later tomorrow  2.  Metabolic acidosis-pretty severe.  Has received amps of bicarb but dialysis will help to regulate-  better 3. Anemia-has been on ESA and iron as an outpatient for a long time.  Continue with these treatments 4. Secondary hyperparathyroidism-last PTH 390 and with low calcium -  started vitamin D with dialysis.  Patient will also need a phosphate binder, will start with calcium based- phos improved 5. HTN/volume-continue with home Norvasc, Coreg .  Will eval for possibly dec amlodipine as OP   Kelly Eisler A Hartley Wyke    Labs: Basic Metabolic Panel: Recent Labs  Lab 04/30/22 0124 04/30/22 0146 05/01/22 0130 05/02/22 0111 05/03/22 0108  NA 136   < > 139 137 135  K 5.2*   < > 3.4* 4.1 4.4  CL 103  --  99 100 95*   CO2 9*  --  16* 19* 23  GLUCOSE 119*  --  82 100* 150*  BUN 209*  --  127* 142* 79*  CREATININE 17.50*  --  11.33* 13.04* 8.21*  CALCIUM 7.0*  6.9*  --  7.4* 7.1* 7.3*  PHOS >30.0*  --   --  11.8* 7.0*   < > = values in this interval not displayed.   Liver Function Tests: Recent Labs  Lab 04/29/22 1843 04/29/22 2250 04/30/22 0124 05/02/22 0111 05/03/22 0108  AST 10*  --  11*  --   --   ALT 26  --  26  --   --   ALKPHOS 74  --  68  --   --   BILITOT 0.7  --  0.6  --   --   PROT 5.6*  --  5.9*  --   --   ALBUMIN 2.8*   < > 2.8* 2.3* 2.2*   < > = values in this interval not displayed.   No results for input(s): "LIPASE", "AMYLASE" in the last 168 hours. No results for input(s): "AMMONIA" in the last 168 hours. CBC: Recent Labs  Lab 04/29/22  1843 04/29/22 1857 04/30/22 0124 04/30/22 0146 05/01/22 0130 05/02/22 0111 05/03/22 0108  WBC 6.7  --  7.3  --  7.4 7.0 9.1  NEUTROABS 5.3  --   --   --  5.8 5.3 7.0  HGB 9.1*   < > 8.7*   < > 8.5* 8.3* 8.5*  HCT 28.4*   < > 28.0*   < > 25.3* 25.8* 26.8*  MCV 84.3  --  84.8  --  79.3* 82.7 84.0  PLT 331  --  318  --  299 286 221   < > = values in this interval not displayed.   Cardiac Enzymes: No results for input(s): "CKTOTAL", "CKMB", "CKMBINDEX", "TROPONINI" in the last 168 hours. CBG: Recent Labs  Lab 05/02/22 1246 05/02/22 1611 05/02/22 2008 05/03/22 0002 05/03/22 0414  GLUCAP 96 148* 163* 131* 113*    Iron Studies: No results for input(s): "IRON", "TIBC", "TRANSFERRIN", "FERRITIN" in the last 72 hours. Studies/Results: No results found. Medications: Infusions:  sodium chloride     anticoagulant sodium citrate      Scheduled Medications:  amLODipine  10 mg Oral Daily   calcium acetate  1,334 mg Oral TID WC   carvedilol  25 mg Oral BID   Chlorhexidine Gluconate Cloth  6 each Topical Q0600   darbepoetin (ARANESP) injection - DIALYSIS  100 mcg Subcutaneous Q Sat-1800   doxercalciferol  2 mcg Intravenous Q  T,Th,Sa-HD   feeding supplement (NEPRO CARB STEADY)  237 mL Oral Q24H   heparin injection (subcutaneous)  5,000 Units Subcutaneous Q8H   insulin aspart  0-6 Units Subcutaneous Q4H   latanoprost  1 drop Both Eyes QHS   multivitamin  1 tablet Oral QHS   sodium bicarbonate  1,300 mg Oral TID   sodium chloride flush  3 mL Intravenous Q12H   timolol  1 drop Both Eyes q AM    have reviewed scheduled and prn medications.  Physical Exam: General: Actually pretty well-appearing black female in no acute distress  Heart: Regular rate and rhythm Lungs: Mostly clear Abdomen: Soft, nontender, nondistended Extremities: Trace to 1+ edema Dialysis Access: left AVF-  good bruit-  right sided TDC    05/03/2022,7:51 AM  LOS: 4 days

## 2022-05-03 NOTE — Consult Note (Signed)
CARDIOLOGY CONSULT NOTE  Patient ID: Michelle Cummings MRN: [id number] DOB/AGE: Jan 13, 1950 72 y.o.  Admit date: 04/29/2022 Referring Physician  Dr Frederick Peers Primary Physician:  Dorothyann Peng, MD Reason for Consultation  Afib  Patient ID: Michelle Cummings, female    DOB: [birth year], 72 y.o.   MRN: [id number]  Chief Complaint  Patient presents with   needs dialysis   HPI:    Michelle Cummings  is a 72 y.o. female with past medical history significant for CKD 5, DM II, HLD, HTN who was sent to the ER from her doctor's office due to worsened kidney failure and in need of initiating dialysis. Cardiology was placed on consultation due to new onset atrial fibrillation. Patient does not have any chest pain or palpitations. She does not feel that she is in Afib. She feels a little weak and tired but otherwise she is doing ok. She is getting HD again tomorrow morning. She has not followed up in our office for many years.  Past Medical History:  Diagnosis Date   Anemia    IDA   Arthritis    Cataracts, bilateral    MD just watching, no surgery as of 04/29/22   Cervical disc disease    Chronic kidney disease    Dr. Antoine Poche yearly for CKD stage II as of 11/26/11   Diabetes mellitus (HCC)    type 2 - diet controlled, no meds   Dyspnea    with exertion   Eczema    Glaucoma    bilateral   Heart murmur    never has caused any problems per patient on 04/29/22   History of chicken pox    Hyperlipidemia    Hypertension    Seasonal allergies    Thyroid nodule    Past Surgical History:  Procedure Laterality Date   AV FISTULA PLACEMENT Left 04/30/2022   Procedure: LEFT ARM CEPHALIC VEIN ARTERIOVENOUS (AV) FISTULA CREATION TDC;  Surgeon: Maeola Harman, MD;  Location: [facility] OR;  Service: Vascular;  Laterality: Left;   CESAREAN SECTION  01/06/1976   COLONOSCOPY     INSERTION OF DIALYSIS CATHETER Right 04/30/2022   Procedure: INSERTION OF PALINDROME 19CM DIALYSIS CATHETER;  Surgeon: Maeola Harman, MD;  Location: [facility] OR;  Service: Vascular;  Laterality: Right;   ORIF ANKLE FRACTURE  11/05/2011   Procedure: Left OPEN REDUCTION INTERNAL FIXATION (ORIF) ANKLE FRACTURE;  Surgeon: Toni Arthurs, MD;  Location: [facility];  Service: Orthopedics;  Laterality: Left;  OPEN REDUCTION INTERNAL FIXATION LEFT LATERAL MALLEOLUS FRACTURE WITH STRESS XRAYS WITH FLOUROSCOPY   ORIF ELBOW FRACTURE  03/05/2011   Procedure: Left OPEN REDUCTION INTERNAL FIXATION (ORIF) ELBOW/OLECRANON FRACTURE;  Surgeon: Sharma Covert, MD;  Location: [facility] OR;  Service: Orthopedics;  Laterality: Left;   THYROIDECTOMY Right 07/20/2012   Procedure: RIGHT THYROID LOBECTOMY WITH ISTHMUSECTOMY;  Surgeon: Serena Colonel, MD;  Location: [facility] OR;  Service: ENT;  Laterality: Right;   TONSILLECTOMY     TUBAL LIGATION     UPPER GI ENDOSCOPY     Social History   Tobacco Use   Smoking status: Never   Smokeless tobacco: Never  Substance Use Topics   Alcohol use: No    Family History  Problem Relation Age of Onset   Hypertension Mother    Early death Father    Breast cancer Sister    Healthy Brother     Marital Status: Married  ROS  Review of Systems  Constitutional: Positive for malaise/fatigue.  Cardiovascular:  Negative for chest pain, dyspnea on exertion, leg swelling, orthopnea and palpitations.  Neurological:  Positive for weakness.   Objective      05/03/2022    4:58 AM 05/03/2022   12:16 AM 05/02/2022    7:36 PM  Vitals with BMI  Systolic 138  143  Diastolic 63  100  Pulse 78 76 90    Blood pressure 138/63, pulse 78, temperature 98.5 F (36.9 C), resp. rate 18, height 5\' 4"  (1.626 m), weight 66.2 kg, SpO2 94 %.    Physical Exam Vitals reviewed.  HENT:     Head: Normocephalic and atraumatic.  Cardiovascular:     Rate and Rhythm: Normal rate. Rhythm irregular.     Heart sounds: Normal heart sounds. No murmur heard. Pulmonary:     Effort: Pulmonary effort is normal.     Breath sounds:  Normal breath sounds.  Abdominal:     General: Bowel sounds are normal.  Musculoskeletal:     Right lower leg: No edema.     Left lower leg: No edema.  Skin:    General: Skin is warm and dry.  Neurological:     Mental Status: She is alert.    Laboratory examination:   Recent Labs    05/01/22 0130 05/02/22 0111 05/03/22 0108  NA 139 137 135  K 3.4* 4.1 4.4  CL 99 100 95*  CO2 16* 19* 23  GLUCOSE 82 100* 150*  BUN 127* 142* 79*  CREATININE 11.33* 13.04* 8.21*  CALCIUM 7.4* 7.1* 7.3*  GFRNONAA 3* 3* 5*   estimated creatinine clearance is 5.7 mL/min (A) (by C-G formula based on SCr of 8.21 mg/dL (H)).     Latest Ref Rng & Units 05/03/2022    1:08 AM 05/02/2022    1:11 AM 05/01/2022    1:30 AM  CMP  Glucose 70 - 99 mg/dL 161  096  82   BUN 8 - 23 mg/dL 79  045  409   Creatinine 0.44 - 1.00 mg/dL 8.11  91.47  82.95   Sodium 135 - 145 mmol/L 135  137  139   Potassium 3.5 - 5.1 mmol/L 4.4  4.1  3.4   Chloride 98 - 111 mmol/L 95  100  99   CO2 22 - 32 mmol/L 23  19  16    Calcium 8.9 - 10.3 mg/dL 7.3  7.1  7.4       Latest Ref Rng & Units 05/03/2022    1:08 AM 05/02/2022    1:11 AM 05/01/2022    1:30 AM  CBC  WBC 4.0 - 10.5 K/uL 9.1  7.0  7.4   Hemoglobin 12.0 - 15.0 g/dL 8.5  8.3  8.5   Hematocrit 36.0 - 46.0 % 26.8  25.8  25.3   Platelets 150 - 400 K/uL 221  286  299    Lipid Panel Recent Labs    11/04/21 1001  CHOL 243*  TRIG 73  LDLCALC 149*  HDL 82  CHOLHDL 3.0    HEMOGLOBIN A1C Lab Results  Component Value Date   HGBA1C 6.0 (H) 02/10/2022   TSH Recent Labs    04/30/22 0124  TSH 3.395   BNP (last 3 results) Recent Labs    04/21/22 2207 04/29/22 1843  BNP 1,129.4* 496.4*   Cardiac Panel (last 3 results) No results for input(s): "CKTOTAL", "CKMB", "TROPONINIHS", "RELINDX" in the last 72 hours.   Medications and allergies   Allergies  Allergen Reactions   Atorvastatin  Other (See Comments)    Myalgias- pt stopped taking    Shellfish  Allergy Itching     Current Meds  Medication Sig   albuterol (VENTOLIN HFA) 108 (90 Base) MCG/ACT inhaler Inhale 2 puffs into the lungs every 6 (six) hours as needed for wheezing or shortness of breath.   amLODipine (NORVASC) 5 MG tablet Take 5 mg by mouth daily.   carvedilol (COREG) 25 MG tablet Take 1 tablet (25 mg total) by mouth 2 (two) times daily. (Patient taking differently: Take 12.5 mg by mouth 2 (two) times daily.)   hydrALAZINE (APRESOLINE) 25 MG tablet Take 25 mg by mouth daily.   latanoprost (XALATAN) 0.005 % ophthalmic solution Place 1 drop into both eyes at bedtime.   sodium bicarbonate 650 MG tablet Take 650 mg by mouth daily as needed for heartburn.   timolol (TIMOPTIC-XR) 0.5 % ophthalmic gel-forming Place 1 drop into both eyes in the morning.   vitamin C (ASCORBIC ACID) 500 MG tablet Take 1,000 mg by mouth daily.   Vitamin D, Ergocalciferol, (DRISDOL) 1.25 MG (50000 UNIT) CAPS capsule TAKE 1 CAPSULE BY MOUTH EVERY 7 DAYS (Patient taking differently: Take 50,000 Units by mouth every 7 (seven) days.)    Scheduled Meds:  amLODipine  10 mg Oral Daily   apixaban  5 mg Oral BID   calcium acetate  1,334 mg Oral TID WC   carvedilol  25 mg Oral BID   Chlorhexidine Gluconate Cloth  6 each Topical Q0600   Chlorhexidine Gluconate Cloth  6 each Topical Q0600   darbepoetin (ARANESP) injection - DIALYSIS  100 mcg Subcutaneous Q Sat-1800   doxercalciferol  2 mcg Intravenous Q T,Th,Sa-HD   feeding supplement (NEPRO CARB STEADY)  237 mL Oral Q24H   insulin aspart  0-6 Units Subcutaneous Q4H   latanoprost  1 drop Both Eyes QHS   multivitamin  1 tablet Oral QHS   sodium bicarbonate  1,300 mg Oral TID   sodium chloride flush  3 mL Intravenous Q12H   timolol  1 drop Both Eyes q AM   Continuous Infusions:  sodium chloride     amiodarone 60 mg/hr (05/03/22 1108)   Followed by   amiodarone     anticoagulant sodium citrate     PRN Meds:.sodium chloride, acetaminophen **OR**  acetaminophen, albuterol, alteplase, anticoagulant sodium citrate, heparin, hydrALAZINE, HYDROcodone-acetaminophen, labetalol, lidocaine (PF), lidocaine-prilocaine, ondansetron **OR** ondansetron (ZOFRAN) IV, pentafluoroprop-tetrafluoroeth, polyethylene glycol, senna-docusate, sodium chloride flush   I/O last 3 completed shifts: In: 760 [P.O.:760] Out: 1000 [Other:1000] Total I/O In: 240 [P.O.:240] Out: 200 [Urine:200]  Net IO Since Admission: -25 mL [05/03/22 1201]   Radiology:   Imaging results have been reviewed and No results found.  Cardiac Studies:   Echo ordered  EKG:  05/02/2022 ~3pm: Afib RVR 05/02/2022 ~5pm: Normal sinus rhythm  Assessment & Recommendations   New onset Paroxysmal Afib RVR, converted to NSR  Amio bolus and gtt ordered in case she develops RVR again. If she remains in NSR today we can stop amio entirely. Check EKG @ 1200 and 1600. D/C amio if both EKGs show NSR. Eliquis dosing by pharmacy Echocardiogram has been ordered Coreg up-titrated by primary team IV Lopressor 5 mg for sustained RVR>115 Discussed with primary team  ESRD on dialysis  Nephrology following  Hypertension Continue Norvasc, Coreg, hydralazine  Hyperlipidemia She did not tolerate statins, developed myalgias. Can try Crestor or Repatha in the future     Michelle Dieter, DO 05/03/2022, 12:01 PM Office: [phone]

## 2022-05-03 NOTE — Plan of Care (Signed)
  Problem: Fluid Volume: Goal: Ability to maintain a balanced intake and output will improve Outcome: Progressing   Problem: Nutritional: Goal: Maintenance of adequate nutrition will improve Outcome: Progressing   Problem: Education: Goal: Knowledge of General Education information will improve Description: Including pain rating scale, medication(s)/side effects and non-pharmacologic comfort measures Outcome: Progressing   Problem: Clinical Measurements: Goal: Respiratory complications will improve Outcome: Progressing   Problem: Activity: Goal: Risk for activity intolerance will decrease Outcome: Progressing   Problem: Nutrition: Goal: Adequate nutrition will be maintained Outcome: Progressing   Problem: Coping: Goal: Level of anxiety will decrease Outcome: Progressing

## 2022-05-03 NOTE — Progress Notes (Signed)
ANTICOAGULATION CONSULT NOTE  Pharmacy Consult for apixaban Indication: atrial fibrillation  Allergies  Allergen Reactions   Atorvastatin Other (See Comments)    Myalgias- pt stopped taking    Shellfish Allergy Itching    Patient Measurements: Height: 5\' 4"  (162.6 cm) Weight: 66.2 kg (145 lb 15.1 oz) IBW/kg (Calculated) : 54.7  Vital Signs: Temp: 98.5 F (36.9 C) (04/28 0826) Temp Source: Oral (04/28 0458) BP: 138/63 (04/28 0458) Pulse Rate: 78 (04/28 0458)  Labs: Recent Labs    05/01/22 0130 05/02/22 0111 05/03/22 0108  HGB 8.5* 8.3* 8.5*  HCT 25.3* 25.8* 26.8*  PLT 299 286 221  CREATININE 11.33* 13.04* 8.21*    Estimated Creatinine Clearance: 5.7 mL/min (A) (by C-G formula based on SCr of 8.21 mg/dL (H)).   Medical History: Past Medical History:  Diagnosis Date   Anemia    IDA   Arthritis    Cataracts, bilateral    MD just watching, no surgery as of 04/29/22   Cervical disc disease    Chronic kidney disease    Dr. Antoine Poche yearly for CKD stage II as of 11/26/11   Diabetes mellitus (HCC)    type 2 - diet controlled, no meds   Dyspnea    with exertion   Eczema    Glaucoma    bilateral   Heart murmur    never has caused any problems per patient on 04/29/22   History of chicken pox    Hyperlipidemia    Hypertension    Seasonal allergies    Thyroid nodule       Assessment: 1 yoF admitted with progressive renal failure and AFib. Pt now ESRD on HD. ChadsVasc = 4, pharmacy to dose apixaban. No AC PTA. Baseline LFTs ok, CBC stable today.   Plan:  Apixaban 5mg  BID Pharmacy will sign off, reconsult as needed  Fredonia Highland, PharmD, BCPS, [facility] Clinical Pharmacist [phone] Please check AMION for all [facility] Pharmacy numbers 05/03/2022

## 2022-05-03 NOTE — Progress Notes (Signed)
Progress Note    Michelle Cummings   ZOX:[id number]  DOB: [birth year]  DOA: 04/29/2022     4 PCP: Michelle Peng, MD  Initial CC: sent to ER for worsening renal failure  Hospital Course: Michelle Cummings is a 72 year old female with PMH CKD 5, anemia of chronic disease, DM II, glaucoma, HLD, HTN who presented with anorexia and fatigue.  She has been followed closely by nephrology outpatient.  She was sent to the ER from her doctor's office due to worsened kidney failure and in need of initiating dialysis.  Interval History:   Was in afib yesterday but noted back in NSR on 5pm EKG. Now back in afib when seen this morning. No CP, SOB. Just feeling "weak" which is mostly attributed to recent HD.  Discussed with cardiology as well today. Further workup to be commenced.   Assessment and Plan: * Acute renal failure superimposed on stage 5 chronic kidney disease, not on chronic dialysis [facility]) - Now progressed to ESRD - Underwent right PermCath placement 4/25 with vascular surgery along with left AV fistula creation - inpatient HD per nephrology and will need outpatient HD chair prior to d/c  Paroxysmal atrial fibrillation with RVR (HCC) - Patient converted into A-fib with RVR afternoon of 05/02/2022.  No prior known history of A-fib.  Suspect this is precipitated in setting of acute illness - Already on Coreg at home; dose increased given ongoing RVR - IV Lopressor PRN; parameters in order - cardiology consulted given recurrent afib today with mild RVR -Remains rhythm unaware -Follow-up echo - Follow-up 1200 and 1600 EKGs; will d/c amio per instructions if remains NSR  ESRD on dialysis [facility]) - see acute on CKD5  Anemia of chronic disease - Baseline 9 to 10 g/dL - Currently at baseline  HTN (hypertension) - Continue Norvasc, Coreg, hydralazine - Agree, may be able to wean some medications with ultrafiltration from dialysis - BP still above goal; PRN meds added  Fluid overload-resolved as of  04/30/2022 - See CKD/ESRD  Glaucoma - Continue eyedrops  HLD (hyperlipidemia) - No longer on Lipitor due to myalgias  Type 2 diabetes mellitus with stage 3 chronic kidney disease (HCC) - Continue SSI and CBG monitoring   Old records reviewed in assessment of this patient  Antimicrobials:   DVT prophylaxis:  SCDs Start: 04/30/22 0043 apixaban (ELIQUIS) tablet 5 mg   Code Status:   Code Status: Full Code  Mobility Assessment (last 72 hours)     Mobility Assessment     Row Name 05/02/22 1950 05/02/22 0800 05/01/22 2000 05/01/22 0900 04/30/22 2130   Does patient have an order for bedrest or is patient medically unstable No - Continue assessment No - Continue assessment No - Continue assessment No - Continue assessment No - Continue assessment   What is the highest level of mobility based on the progressive mobility assessment? Level 5 (Walks with assist in room/hall) - Balance while stepping forward/back and can walk in room with assist - Complete Level 5 (Walks with assist in room/hall) - Balance while stepping forward/back and can walk in room with assist - Complete Level 5 (Walks with assist in room/hall) - Balance while stepping forward/back and can walk in room with assist - Complete Level 5 (Walks with assist in room/hall) - Balance while stepping forward/back and can walk in room with assist - Complete Level 5 (Walks with assist in room/hall) - Balance while stepping forward/back and can walk in room with assist - Complete  Barriers to discharge: Awaiting outpatient dialysis set up Disposition Plan:  home  Status is: Inpt  Objective: Blood pressure (!) 107/43, pulse 74, temperature 98.3 F (36.8 C), temperature source Oral, resp. rate 18, height 5\' 4"  (1.626 m), weight 66.2 kg, SpO2 92 %.  Examination:  Physical Exam Constitutional:      Appearance: Normal appearance.  HENT:     Head: Normocephalic and atraumatic.     Mouth/Throat:     Mouth: Mucous  membranes are moist.  Eyes:     Extraocular Movements: Extraocular movements intact.  Cardiovascular:     Rate and Rhythm: Normal rate. Rhythm irregular.  Pulmonary:     Effort: Pulmonary effort is normal. No respiratory distress.     Breath sounds: Normal breath sounds. No wheezing.  Abdominal:     General: Bowel sounds are normal. There is no distension.     Palpations: Abdomen is soft.     Tenderness: There is no abdominal tenderness.  Musculoskeletal:        General: Normal range of motion.     Cervical back: Normal range of motion and neck supple.  Skin:    General: Skin is warm and dry.  Neurological:     General: No focal deficit present.     Mental Status: She is alert.  Psychiatric:        Mood and Affect: Mood normal.      Consultants:  Nephrology Vascular surgery Cardiology   Procedures:  04/30/22:  Procedure Performed: 1.  Right IJ 19 cm tunneled dialysis catheter placement with ultrasound fluoroscopic guidance 2.  Creation of left brachial artery to cephalic vein AV fistula with limited left brachial artery endarterectomy  Data Reviewed: Results for orders placed or performed during the hospital encounter of 04/29/22 (from the past 24 hour(s))  Glucose, capillary     Status: Abnormal   Collection Time: 05/02/22  4:11 PM  Result Value Ref Range   Glucose-Capillary 148 (H) 70 - 99 mg/dL   Comment 1 Notify RN   Glucose, capillary     Status: Abnormal   Collection Time: 05/02/22  8:08 PM  Result Value Ref Range   Glucose-Capillary 163 (H) 70 - 99 mg/dL  Glucose, capillary     Status: Abnormal   Collection Time: 05/03/22 12:02 AM  Result Value Ref Range   Glucose-Capillary 131 (H) 70 - 99 mg/dL  CBC with Differential/Platelet     Status: Abnormal   Collection Time: 05/03/22  1:08 AM  Result Value Ref Range   WBC 9.1 4.0 - 10.5 K/uL   RBC 3.19 (L) 3.87 - 5.11 MIL/uL   Hemoglobin 8.5 (L) 12.0 - 15.0 g/dL   HCT 19.1 (L) 47.8 - 29.5 %   MCV 84.0 80.0 -  100.0 fL   MCH 26.6 26.0 - 34.0 pg   MCHC 31.7 30.0 - 36.0 g/dL   RDW 62.1 (H) 30.8 - 65.7 %   Platelets 221 150 - 400 K/uL   nRBC 0.3 (H) 0.0 - 0.2 %   Neutrophils Relative % 77 %   Neutro Abs 7.0 1.7 - 7.7 K/uL   Lymphocytes Relative 7 %   Lymphs Abs 0.7 0.7 - 4.0 K/uL   Monocytes Relative 15 %   Monocytes Absolute 1.4 (H) 0.1 - 1.0 K/uL   Eosinophils Relative 0 %   Eosinophils Absolute 0.0 0.0 - 0.5 K/uL   Basophils Relative 0 %   Basophils Absolute 0.0 0.0 - 0.1 K/uL   Immature Granulocytes 1 %  Abs Immature Granulocytes 0.12 (H) 0.00 - 0.07 K/uL  Magnesium     Status: None   Collection Time: 05/03/22  1:08 AM  Result Value Ref Range   Magnesium 2.0 1.7 - 2.4 mg/dL  Renal function panel     Status: Abnormal   Collection Time: 05/03/22  1:08 AM  Result Value Ref Range   Sodium 135 135 - 145 mmol/L   Potassium 4.4 3.5 - 5.1 mmol/L   Chloride 95 (L) 98 - 111 mmol/L   CO2 23 22 - 32 mmol/L   Glucose, Bld 150 (H) 70 - 99 mg/dL   BUN 79 (H) 8 - 23 mg/dL   Creatinine, Ser 1.61 (H) 0.44 - 1.00 mg/dL   Calcium 7.3 (L) 8.9 - 10.3 mg/dL   Phosphorus 7.0 (H) 2.5 - 4.6 mg/dL   Albumin 2.2 (L) 3.5 - 5.0 g/dL   GFR, Estimated 5 (L) >60 mL/min   Anion gap 17 (H) 5 - 15  Glucose, capillary     Status: Abnormal   Collection Time: 05/03/22  4:14 AM  Result Value Ref Range   Glucose-Capillary 113 (H) 70 - 99 mg/dL  Glucose, capillary     Status: Abnormal   Collection Time: 05/03/22  8:36 AM  Result Value Ref Range   Glucose-Capillary 117 (H) 70 - 99 mg/dL  Glucose, capillary     Status: Abnormal   Collection Time: 05/03/22 11:15 AM  Result Value Ref Range   Glucose-Capillary 205 (H) 70 - 99 mg/dL    I have reviewed pertinent nursing notes, vitals, labs, and images as necessary. I have ordered labwork to follow up on as indicated.  I have reviewed the last notes from staff over past 24 hours. I have discussed patient's care plan and test results with nursing staff, CM/SW, and  other staff as appropriate.  Time spent: Greater than 50% of the 55 minute visit was spent in counseling/coordination of care for the patient as laid out in the A&P.   LOS: 4 days   Lewie Chamber, MD [facility] 05/03/2022, 1:04 PM

## 2022-05-04 ENCOUNTER — Inpatient Hospital Stay (HOSPITAL_COMMUNITY): Payer: Medicare PPO

## 2022-05-04 ENCOUNTER — Other Ambulatory Visit (HOSPITAL_COMMUNITY): Payer: Medicare PPO

## 2022-05-04 ENCOUNTER — Encounter (HOSPITAL_COMMUNITY): Payer: Self-pay

## 2022-05-04 ENCOUNTER — Encounter (HOSPITAL_COMMUNITY): Payer: Medicare PPO

## 2022-05-04 DIAGNOSIS — I32 Pericarditis in diseases classified elsewhere: Secondary | ICD-10-CM

## 2022-05-04 DIAGNOSIS — N189 Chronic kidney disease, unspecified: Secondary | ICD-10-CM | POA: Diagnosis not present

## 2022-05-04 DIAGNOSIS — N179 Acute kidney failure, unspecified: Secondary | ICD-10-CM | POA: Diagnosis not present

## 2022-05-04 DIAGNOSIS — N185 Chronic kidney disease, stage 5: Secondary | ICD-10-CM | POA: Diagnosis not present

## 2022-05-04 DIAGNOSIS — I480 Paroxysmal atrial fibrillation: Secondary | ICD-10-CM | POA: Diagnosis not present

## 2022-05-04 LAB — CBC WITH DIFFERENTIAL/PLATELET
Abs Immature Granulocytes: 0.07 10*3/uL (ref 0.00–0.07)
Basophils Absolute: 0 10*3/uL (ref 0.0–0.1)
Basophils Relative: 0 %
Eosinophils Absolute: 0 10*3/uL (ref 0.0–0.5)
Eosinophils Relative: 0 %
HCT: 25.6 % — ABNORMAL LOW (ref 36.0–46.0)
Hemoglobin: 8.2 g/dL — ABNORMAL LOW (ref 12.0–15.0)
Immature Granulocytes: 1 %
Lymphocytes Relative: 8 %
Lymphs Abs: 0.8 10*3/uL (ref 0.7–4.0)
MCH: 27.3 pg (ref 26.0–34.0)
MCHC: 32 g/dL (ref 30.0–36.0)
MCV: 85.3 fL (ref 80.0–100.0)
Monocytes Absolute: 1.2 10*3/uL — ABNORMAL HIGH (ref 0.1–1.0)
Monocytes Relative: 12 %
Neutro Abs: 8 10*3/uL — ABNORMAL HIGH (ref 1.7–7.7)
Neutrophils Relative %: 79 %
Platelets: 239 10*3/uL (ref 150–400)
RBC: 3 MIL/uL — ABNORMAL LOW (ref 3.87–5.11)
RDW: 18.1 % — ABNORMAL HIGH (ref 11.5–15.5)
WBC: 10.2 10*3/uL (ref 4.0–10.5)
nRBC: 0 % (ref 0.0–0.2)

## 2022-05-04 LAB — RENAL FUNCTION PANEL
Albumin: 2.1 g/dL — ABNORMAL LOW (ref 3.5–5.0)
Anion gap: 14 (ref 5–15)
BUN: 92 mg/dL — ABNORMAL HIGH (ref 8–23)
CO2: 24 mmol/L (ref 22–32)
Calcium: 6.9 mg/dL — ABNORMAL LOW (ref 8.9–10.3)
Chloride: 93 mmol/L — ABNORMAL LOW (ref 98–111)
Creatinine, Ser: 9.17 mg/dL — ABNORMAL HIGH (ref 0.44–1.00)
GFR, Estimated: 4 mL/min — ABNORMAL LOW (ref >60)
Glucose, Bld: 127 mg/dL — ABNORMAL HIGH (ref 70–99)
Phosphorus: 8.7 mg/dL — ABNORMAL HIGH (ref 2.5–4.6)
Potassium: 4.2 mmol/L (ref 3.5–5.1)
Sodium: 131 mmol/L — ABNORMAL LOW (ref 135–145)

## 2022-05-04 LAB — GLUCOSE, CAPILLARY
Glucose-Capillary: 143 mg/dL — ABNORMAL HIGH (ref 70–99)
Glucose-Capillary: 145 mg/dL — ABNORMAL HIGH (ref 70–99)
Glucose-Capillary: 145 mg/dL — ABNORMAL HIGH (ref 70–99)
Glucose-Capillary: 142 mg/dL — ABNORMAL HIGH (ref 70–99)

## 2022-05-04 LAB — MAGNESIUM: Magnesium: 2.1 mg/dL (ref 1.7–2.4)

## 2022-05-04 LAB — ECHOCARDIOGRAM COMPLETE
Height: 64 in
Weight: 2324.53 oz

## 2022-05-04 MED ORDER — DOXERCALCIFEROL 4 MCG/2ML IV SOLN
2.0000 ug | Freq: Once | INTRAVENOUS | Status: AC
  Administered 2022-05-04: 2 ug via INTRAVENOUS

## 2022-05-04 MED ORDER — PANTOPRAZOLE SODIUM 40 MG PO TBEC
40.0000 mg | DELAYED_RELEASE_TABLET | Freq: Every day | ORAL | Status: DC
  Administered 2022-05-04: 40 mg via ORAL
  Filled 2022-05-04: qty 1

## 2022-05-04 MED ORDER — COLCHICINE 0.6 MG PO TABS: 0.6000 mg | ORAL_TABLET | ORAL | Status: DC

## 2022-05-04 MED ORDER — COLCHICINE 0.6 MG PO TABS: 0.6000 mg | ORAL_TABLET | Freq: Every day | ORAL | Status: DC

## 2022-05-04 MED ORDER — APIXABAN 5 MG PO TABS
5.0000 mg | ORAL_TABLET | Freq: Two times a day (BID) | ORAL | Status: DC
  Administered 2022-05-04 – 2022-05-05 (×2): 5 mg via ORAL
  Filled 2022-05-04 (×2): qty 1

## 2022-05-04 MED ORDER — COLCHICINE 0.6 MG PO TABS
1.2000 mg | ORAL_TABLET | Freq: Once | ORAL | Status: AC
  Administered 2022-05-04: 1.2 mg via ORAL
  Filled 2022-05-04: qty 2

## 2022-05-04 MED ORDER — IBUPROFEN 600 MG PO TABS
600.0000 mg | ORAL_TABLET | Freq: Three times a day (TID) | ORAL | Status: DC
  Filled 2022-05-04 (×2): qty 1

## 2022-05-04 NOTE — Progress Notes (Addendum)
Subjective:    Objective Vital signs in last 24 hours: Vitals:   05/04/22 1200 05/04/22 1208 05/04/22 1224 05/04/22 1312  BP: (!) 150/96 (!) 144/79 (!) 114/58 126/65  Pulse: 68 69 71 77  Resp: 18 16 16 20   Temp:   98.6 F (37 C) 98 F (36.7 C)  TempSrc:   Oral Oral  SpO2: 100% 100% 100% 97%  Weight:   65.9 kg   Height:       Weight change: -0.3 kg  Intake/Output Summary (Last 24 hours) at 05/04/2022 1506 Last data filed at 05/04/2022 1300 Gross per 24 hour  Intake 240 ml  Output 1300 ml  Net -1060 ml   Physical Exam: General: Actually pretty well-appearing black female in no acute distress  Heart: Regular rate and rhythm Lungs: Mostly clear Abdomen: Soft, nontender, nondistended Extremities: Trace to 1+ edema Dialysis Access: left AVF-  good bruit-  right sided TDC   Assessment/ Plan: Pt is a 72 y.o. yo female with stage V CKD who was admitted on 04/29/2022 with uremia and needing to start dialysis   Assessment/Plan: 1.  Uremia due to now ESRD-this has progressed pretty significantly in the last 6 weeks.  We had planned to place fistula as OP but now needed to place a fistula and TDC to start dialysis for her uremic symptoms.  S/p [facility] and AVF as well as first dialysis treatment 4/25, second tx 4/27.  establishing with an outpatient dialysis unit upon discharge. Getting HD #3 today / Monday.  She has already received a call from [facility].  2.  Metabolic acidosis-pretty severe.  Has received amps of bicarb but dialysis will help to regulate-  better 3. Anemia-has been on ESA and iron as an outpatient for a long time.  Continue with these treatments 4. Secondary hyperparathyroidism-last PTH 390 and with low calcium -  started vitamin D with dialysis.  Patient will also need a phosphate binder, will start with calcium based- phos improved 5. HTN/volume-continue with home Norvasc, Coreg .  Will eval for possibly dec amlodipine as OP  6. Pericarditis - based on EKG changes and some  chest pains on admission. Getting colchicine and nsaids. If uremic pericarditis, we could get by with short-term Rx since dialysis usually takes care of the problem in new ESRD patients. Will d/w cardiology.   Addendum: have d/w cardiology, they are okay w/ lowering the Rx of her pericarditis, since the new dialysis takes care of most of the pericardial inflammation. Have d/w pmd --> will give 2 wks of colchicine at low/ esrd dosing and dc the nsaids, this should be enough.   Vinson Moselle, MD 05/04/2022, 3:29 PM  Recent Labs  Lab 05/03/22 0108 05/04/22 0123  HGB 8.5* 8.2*  ALBUMIN 2.2* 2.1*  CALCIUM 7.3* 6.9*  PHOS 7.0* 8.7*  CREATININE 8.21* 9.17*  K 4.4 4.2    Inpatient medications:  amLODipine  10 mg Oral Daily   calcium acetate  1,334 mg Oral TID WC   carvedilol  25 mg Oral BID   Chlorhexidine Gluconate Cloth  6 each Topical Q0600   Chlorhexidine Gluconate Cloth  6 each Topical Q0600   [START ON 05/05/2022] colchicine  0.6 mg Oral Daily   darbepoetin (ARANESP) injection - DIALYSIS  100 mcg Subcutaneous Q Sat-1800   doxercalciferol  2 mcg Intravenous Q T,Th,Sa-HD   feeding supplement (NEPRO CARB STEADY)  237 mL Oral Q24H   ibuprofen  600 mg Oral TID   insulin aspart  0-6  Units Subcutaneous Q4H   latanoprost  1 drop Both Eyes QHS   multivitamin  1 tablet Oral QHS   pantoprazole  40 mg Oral Q0600   sodium bicarbonate  1,300 mg Oral TID   sodium chloride flush  3 mL Intravenous Q12H   timolol  1 drop Both Eyes q AM    sodium chloride     anticoagulant sodium citrate     sodium chloride, acetaminophen **OR** acetaminophen, albuterol, alteplase, anticoagulant sodium citrate, heparin, hydrALAZINE, HYDROcodone-acetaminophen, labetalol, lidocaine (PF), lidocaine-prilocaine, lip balm, metoprolol tartrate, ondansetron **OR** ondansetron (ZOFRAN) IV, pentafluoroprop-tetrafluoroeth, polyethylene glycol, senna-docusate, sodium chloride flush

## 2022-05-04 NOTE — Progress Notes (Signed)
ANTICOAGULATION CONSULT NOTE - Initial Consult  Pharmacy Consult for Apixaban Indication: atrial fibrillation  Allergies  Allergen Reactions   Atorvastatin Other (See Comments)    Myalgias- pt stopped taking    Shellfish Allergy Itching    Patient Measurements: Height: 5\' 4"  (162.6 cm) Weight: 65.9 kg (145 lb 4.5 oz) IBW/kg (Calculated) : 54.7   Vital Signs: Temp: 98.5 F (36.9 C) (04/29 1538) Temp Source: Oral (04/29 1538) BP: 100/61 (04/29 1538) Pulse Rate: 80 (04/29 1538)  Labs: Recent Labs    05/02/22 0111 05/03/22 0108 05/04/22 0123  HGB 8.3* 8.5* 8.2*  HCT 25.8* 26.8* 25.6*  PLT 286 221 239  CREATININE 13.04* 8.21* 9.17*    Estimated Creatinine Clearance: 5.1 mL/min (A) (by C-G formula based on SCr of 9.17 mg/dL (H)).   Medical History: Past Medical History:  Diagnosis Date   Anemia    IDA   Arthritis    Cataracts, bilateral    MD just watching, no surgery as of 04/29/22   Cervical disc disease    Chronic kidney disease    Dr. Antoine Poche yearly for CKD stage II as of 11/26/11   Diabetes mellitus (HCC)    type 2 - diet controlled, no meds   Dyspnea    with exertion   Eczema    Glaucoma    bilateral   Heart murmur    never has caused any problems per patient on 04/29/22   History of chicken pox    Hyperlipidemia    Hypertension    Seasonal allergies    Thyroid nodule    Assessment: 72 yo female with new onset afib. Pharmacy consulted to start apixaban. Weight >60kg, Scr >1.5, and age <9. Will start at 5mg  BID  Goal of Therapy:   Monitor platelets by anticoagulation protocol: Yes   Plan:  Apixaban 5mg  PO BID Patient to be educated Monitor CBC and signs of bleeding  Adisen Bennion A. Jeanella Craze, PharmD, BCPS, FNKF Clinical Pharmacist [place] Please utilize Amion for appropriate phone number to reach the unit pharmacist [facility])  05/04/2022,4:31 PM

## 2022-05-04 NOTE — Progress Notes (Addendum)
Subjective:  Patient seen and examined at bedside, resting comfortably. No events overnight. Denies chest pain, shortness of breath, palpitations, diaphoresis, syncope, edema, PND, orthopnea.   Intake/Output from previous day:  I/O last 3 completed shifts: In: 1174.5 [P.O.:1120; I.V.:54.5] Out: 500 [Urine:500] Total I/O In: 240 [P.O.:240] Out: 1000 [Other:1000] Net IO Since Admission: -550.47 mL [05/04/22 1621]  Blood pressure 100/61, pulse 80, temperature 98.5 F (36.9 C), temperature source Oral, resp. rate 18, height 5\' 4"  (1.626 m), weight 65.9 kg, SpO2 92 %. Physical Exam Vitals reviewed.  HENT:     Head: Normocephalic and atraumatic.  Cardiovascular:     Rate and Rhythm: Normal rate and regular rhythm.     Heart sounds: Normal heart sounds. No murmur heard. Pulmonary:     Effort: Pulmonary effort is normal.     Breath sounds: Normal breath sounds.  Abdominal:     General: Bowel sounds are normal.  Musculoskeletal:     Right lower leg: No edema.     Left lower leg: No edema.  Skin:    General: Skin is warm and dry.  Neurological:     Mental Status: She is alert.     Lab Results: Lab Results  Component Value Date   NA 131 (L) 05/04/2022   K 4.2 05/04/2022   CO2 24 05/04/2022   GLUCOSE 127 (H) 05/04/2022   BUN 92 (H) 05/04/2022   CREATININE 9.17 (H) 05/04/2022   CALCIUM 6.9 (L) 05/04/2022   EGFR 5 01/15/2022   GFRNONAA 4 (L) 05/04/2022    BNP (last 3 results) Recent Labs    04/21/22 2207 04/29/22 1843  BNP 1,129.4* 496.4*    ProBNP (last 3 results) No results for input(s): "PROBNP" in the last 8760 hours.    Latest Ref Rng & Units 05/04/2022    1:23 AM 05/03/2022    1:08 AM 05/02/2022    1:11 AM  BMP  Glucose 70 - 99 mg/dL 914  782  956   BUN 8 - 23 mg/dL 92  79  213   Creatinine 0.44 - 1.00 mg/dL 0.86  5.78  46.96   Sodium 135 - 145 mmol/L 131  135  137   Potassium 3.5 - 5.1 mmol/L 4.2  4.4  4.1   Chloride 98 - 111 mmol/L 93  95  100   CO2 22 -  32 mmol/L 24  23  19    Calcium 8.9 - 10.3 mg/dL 6.9  7.3  7.1       Latest Ref Rng & Units 05/04/2022    1:23 AM 05/03/2022    1:08 AM 05/02/2022    1:11 AM  Hepatic Function  Albumin 3.5 - 5.0 g/dL 2.1  2.2  2.3       Latest Ref Rng & Units 05/04/2022    1:23 AM 05/03/2022    1:08 AM 05/02/2022    1:11 AM  CBC  WBC 4.0 - 10.5 K/uL 10.2  9.1  7.0   Hemoglobin 12.0 - 15.0 g/dL 8.2  8.5  8.3   Hematocrit 36.0 - 46.0 % 25.6  26.8  25.8   Platelets 150 - 400 K/uL 239  221  286    Lipid Panel     Component Value Date/Time   CHOL 243 (H) 11/04/2021 1001   TRIG 73 11/04/2021 1001   HDL 82 11/04/2021 1001   CHOLHDL 3.0 11/04/2021 1001   CHOLHDL 4 06/02/2016 0833   VLDL 30.6 06/02/2016 0833   LDLCALC 149 (H) 11/04/2021  1001   Cardiac Panel (last 3 results) No results for input(s): "CKTOTAL", "CKMB", "TROPONINI", "RELINDX" in the last 72 hours.  HEMOGLOBIN A1C Lab Results  Component Value Date   HGBA1C 6.0 (H) 02/10/2022   TSH Recent Labs    04/30/22 0124  TSH 3.395   Imaging: Imaging results have been reviewed and No results found.  Cardiac Studies:  Tele: normal sinus rhythm this morning  EKG:  05/02/2022 ~3pm: Afib RVR 05/02/2022 ~5pm: Normal sinus rhythm 05/03/2022 ~12pm: Sinus rhythm with Premature atrial complexes. Early repolarization 05/03/2022: Sinus rhythm with Premature supraventricular complexes. Acute pericarditis with diffuse ST elevations, PR elevation with ST depression in aVR.   Recent Results (from the past 16109 hour(s))  ECHOCARDIOGRAM COMPLETE   Collection Time: 05/04/22  3:33 PM  Result Value   Weight 2,324.53   Height 64   BP 126/65   *Note: Due to a large number of results and/or encounters for the requested time period, some results have not been displayed. A complete set of results can be found in Results Review.    Scheduled Meds:  amLODipine  10 mg Oral Daily   calcium acetate  1,334 mg Oral TID WC   carvedilol  25 mg Oral BID    Chlorhexidine Gluconate Cloth  6 each Topical Q0600   Chlorhexidine Gluconate Cloth  6 each Topical Q0600   [START ON 05/05/2022] colchicine  0.6 mg Oral Once per day on Tue Fri   darbepoetin (ARANESP) injection - DIALYSIS  100 mcg Subcutaneous Q Sat-1800   doxercalciferol  2 mcg Intravenous Q T,Th,Sa-HD   feeding supplement (NEPRO CARB STEADY)  237 mL Oral Q24H   insulin aspart  0-6 Units Subcutaneous Q4H   latanoprost  1 drop Both Eyes QHS   multivitamin  1 tablet Oral QHS   sodium bicarbonate  1,300 mg Oral TID   sodium chloride flush  3 mL Intravenous Q12H   timolol  1 drop Both Eyes q AM   Continuous Infusions:  sodium chloride     anticoagulant sodium citrate     PRN Meds:.sodium chloride, acetaminophen **OR** acetaminophen, albuterol, alteplase, anticoagulant sodium citrate, heparin, hydrALAZINE, HYDROcodone-acetaminophen, labetalol, lidocaine (PF), lidocaine-prilocaine, lip balm, metoprolol tartrate, ondansetron **OR** ondansetron (ZOFRAN) IV, pentafluoroprop-tetrafluoroeth, polyethylene glycol, senna-docusate, sodium chloride flush  Assessment & Plan Michelle Cummings is a 72 y.o. female patient with new onset paroxysmal atrial fibrillation and acute uremic pericarditis.  New onset Paroxysmal Afib RVR, converted to NSR  Amio bolus and gtt discontinued as she remains in sinus rhythm Continue Eliquis Coreg up-titrated by primary team IV Lopressor 5 mg for sustained RVR>115   Acute uremic pericarditis Will initiate colchicine - discussed with nephrology usually they get better w/ initiation of dialysis and we might have to treat for 5-7 days w/ colchicine.  I will restart her on Eliquis for Afib as she will likely not need NSAIDs.  ESRD on dialysis  Nephrology following   Hypertension Continue Norvasc, Coreg, hydralazine   Hyperlipidemia She did not tolerate statins, developed myalgias. Can try Crestor or Repatha in the future     Clotilde Dieter, DO 05/04/2022, 4:21  PM Office: [phone] Fax: [phone] Pager: [phone]

## 2022-05-04 NOTE — Progress Notes (Signed)
POST HD TX NOTE  05/04/22 1224  Vitals  Temp 98.6 F (37 C)  Temp Source Oral  BP (!) 114/58  MAP (mmHg) 73  BP Location Right Arm  BP Method Automatic  Patient Position (if appropriate) Lying  Pulse Rate 71  Pulse Rate Source Monitor  ECG Heart Rate 75  Resp 16  Oxygen Therapy  SpO2 100 %  O2 Device Nasal Cannula  O2 Flow Rate (L/min) 2 L/min  Pulse Oximetry Type Continuous  During Treatment Monitoring  Intra-Hemodialysis Comments (S)   (post HD tx VS check)  Post Treatment  Dialyzer Clearance Lightly streaked  Duration of HD Treatment -hour(s) 3.25 hour(s)  Hemodialysis Intake (mL) 0 mL  Liters Processed 58.3  Fluid Removed (mL) 1000 mL  Tolerated HD Treatment Yes  Post-Hemodialysis Comments (S)  tx completed w/o problem, UF goal met, blood rinsed back, VSS. Medication Admin: Hectorol IVP, Heparin Dwells 3200 units  Hemodialysis Catheter Right Internal jugular Double lumen Permanent (Tunneled)  Placement Date/Time: 04/30/22 1210   Placed prior to admission: No  Serial / Lot #: [id number]  Expiration Date: 07/21/26  Time Out: Correct patient;Correct site;Correct procedure  Maximum sterile barrier precautions: Hand hygiene;Cap;Mask;Sterile gow...  Site Condition No complications  Blue Lumen Status Heparin locked;Dead end cap in place  Red Lumen Status Heparin locked;Dead end cap in place  Purple Lumen Status N/A  Catheter fill solution Heparin 1000 units/ml  Catheter fill volume (Arterial) 1.6 cc  Catheter fill volume (Venous) 1.6  Dressing Type Transparent  Dressing Status Antimicrobial disc in place;Clean, Dry, Intact  Drainage Description None  Dressing Change Due 05/09/22  Post treatment catheter status Capped and Clamped

## 2022-05-04 NOTE — Progress Notes (Addendum)
Attempted to meet with pt at bedside to discuss out-pt HD arrangements. Pt requested that navigator return in 1 hr. Will attempt to f/u as requested.   Olivia Canter Renal Navigator [phone]  Addendum at 3:13 pm: Attempted to meet with pt at bedside but pt currently receiving test at bedside. Will f/u as schedule allows (likely tomorrow am). Will need to make sure pt agreeable to out-pt HD arrangements.

## 2022-05-04 NOTE — Progress Notes (Signed)
Progress Note    Michelle Cummings   WUJ:[id number]  DOB: [birth year]  DOA: 04/29/2022     5 PCP: Dorothyann Peng, MD  Initial CC: sent to ER for worsening renal failure  Hospital Course: Michelle Cummings is a 72 year old female with PMH CKD 5, anemia of chronic disease, DM II, glaucoma, HLD, HTN who presented with anorexia and fatigue.  She has been followed closely by nephrology outpatient.  She was sent to the ER from her doctor's office due to worsened kidney failure and in need of initiating dialysis.  Interval History:  No events overnight.  Seen on dialysis this morning.  Chest discomfort resolved from yesterday.  She remains in sinus rhythm.  Assessment and Plan: * Acute renal failure superimposed on stage 5 chronic kidney disease, not on chronic dialysis [facility]) - Now progressed to ESRD - Underwent right PermCath placement 4/25 with vascular surgery along with left AV fistula creation - inpatient HD per nephrology and will need outpatient HD chair prior to d/c  Uremic pericarditis - EKGs on 4/28 concerning for diffuse ST elevation vs early repol; patient also had some chest tightness at times - has been started on colchicine and ibuprofen per cardiology - Protonix also added for GI protection while on ibuprofen -3 months treatment recommended of colchicine and total 12 weeks ibuprofen (6 weeks high dose, then 6 weeks low dose)  Paroxysmal atrial fibrillation with RVR (HCC) - Patient converted into A-fib with RVR afternoon of 05/02/2022.  No prior known history of A-fib.  Suspect this is precipitated in setting of acute illness - Already on Coreg at home; dose increased given ongoing RVR - IV Lopressor PRN; parameters in order - cardiology consulted given recurrent afib with mild RVR -Remains rhythm unaware -Follow-up echo - no further amio per cardiology and eliquis discontinued given short lived afib and initiation of pericarditis treatment to prevent bleed risk as well  ESRD on  dialysis [facility]) - see acute on CKD5  Anemia of chronic disease - Baseline 9 to 10 g/dL - Currently at baseline  HTN (hypertension) - Continue Norvasc, Coreg, hydralazine - Agree, may be able to wean some medications with ultrafiltration from dialysis - BP still above goal; PRN meds added  Fluid overload-resolved as of 04/30/2022 - See CKD/ESRD  Glaucoma - Continue eyedrops  HLD (hyperlipidemia) - No longer on Lipitor due to myalgias  Type 2 diabetes mellitus with stage 3 chronic kidney disease (HCC) - Continue SSI and CBG monitoring   Old records reviewed in assessment of this patient  Antimicrobials:   DVT prophylaxis:  SCDs Start: 04/30/22 0043   Code Status:   Code Status: Full Code  Mobility Assessment (last 72 hours)     Mobility Assessment     Row Name 05/04/22 0315 05/03/22 1945 05/03/22 0838 05/02/22 1950 05/02/22 0800   Does patient have an order for bedrest or is patient medically unstable No - Continue assessment No - Continue assessment No - Continue assessment No - Continue assessment No - Continue assessment   What is the highest level of mobility based on the progressive mobility assessment? Level 4 (Walks with assist in room) - Balance while marching in place and cannot step forward and back - Complete Level 2 (Chairfast) - Balance while sitting on edge of bed and cannot stand Level 5 (Walks with assist in room/hall) - Balance while stepping forward/back and can walk in room with assist - Complete Level 5 (Walks with assist in room/hall) - Balance while stepping  forward/back and can walk in room with assist - Complete Level 5 (Walks with assist in room/hall) - Balance while stepping forward/back and can walk in room with assist - Complete   Is the above level different from baseline mobility prior to current illness? -- No - Consider discontinuing PT/OT -- -- --    Row Name 05/01/22 2000           Does patient have an order for bedrest or is patient  medically unstable No - Continue assessment       What is the highest level of mobility based on the progressive mobility assessment? Level 5 (Walks with assist in room/hall) - Balance while stepping forward/back and can walk in room with assist - Complete                Barriers to discharge: none Disposition Plan:  home  Status is: Inpt  Objective: Blood pressure 126/65, pulse 77, temperature 98 F (36.7 C), temperature source Oral, resp. rate 20, height 5\' 4"  (1.626 m), weight 65.9 kg, SpO2 97 %.  Examination:  Physical Exam Constitutional:      Appearance: Normal appearance.  HENT:     Head: Normocephalic and atraumatic.     Mouth/Throat:     Mouth: Mucous membranes are moist.  Eyes:     Extraocular Movements: Extraocular movements intact.  Cardiovascular:     Rate and Rhythm: Normal rate and regular rhythm.  Pulmonary:     Effort: Pulmonary effort is normal. No respiratory distress.     Breath sounds: Normal breath sounds. No wheezing.  Abdominal:     General: Bowel sounds are normal. There is no distension.     Palpations: Abdomen is soft.     Tenderness: There is no abdominal tenderness.  Musculoskeletal:        General: Normal range of motion.     Cervical back: Normal range of motion and neck supple.  Skin:    General: Skin is warm and dry.  Neurological:     General: No focal deficit present.     Mental Status: She is alert.  Psychiatric:        Mood and Affect: Mood normal.      Consultants:  Nephrology Vascular surgery Cardiology   Procedures:  04/30/22:  Procedure Performed: 1.  Right IJ 19 cm tunneled dialysis catheter placement with ultrasound fluoroscopic guidance 2.  Creation of left brachial artery to cephalic vein AV fistula with limited left brachial artery endarterectomy  Data Reviewed: Results for orders placed or performed during the hospital encounter of 04/29/22 (from the past 24 hour(s))  Glucose, capillary     Status: Abnormal    Collection Time: 05/03/22  4:06 PM  Result Value Ref Range   Glucose-Capillary 142 (H) 70 - 99 mg/dL  Glucose, capillary     Status: Abnormal   Collection Time: 05/03/22  7:32 PM  Result Value Ref Range   Glucose-Capillary 146 (H) 70 - 99 mg/dL   Comment 1 Notify RN    Comment 2 Document in Chart   Glucose, capillary     Status: Abnormal   Collection Time: 05/03/22 11:22 PM  Result Value Ref Range   Glucose-Capillary 136 (H) 70 - 99 mg/dL   Comment 1 Notify RN    Comment 2 Document in Chart   CBC with Differential/Platelet     Status: Abnormal   Collection Time: 05/04/22  1:23 AM  Result Value Ref Range   WBC 10.2 4.0 -  10.5 K/uL   RBC 3.00 (L) 3.87 - 5.11 MIL/uL   Hemoglobin 8.2 (L) 12.0 - 15.0 g/dL   HCT 16.1 (L) 09.6 - 04.5 %   MCV 85.3 80.0 - 100.0 fL   MCH 27.3 26.0 - 34.0 pg   MCHC 32.0 30.0 - 36.0 g/dL   RDW 40.9 (H) 81.1 - 91.4 %   Platelets 239 150 - 400 K/uL   nRBC 0.0 0.0 - 0.2 %   Neutrophils Relative % 79 %   Neutro Abs 8.0 (H) 1.7 - 7.7 K/uL   Lymphocytes Relative 8 %   Lymphs Abs 0.8 0.7 - 4.0 K/uL   Monocytes Relative 12 %   Monocytes Absolute 1.2 (H) 0.1 - 1.0 K/uL   Eosinophils Relative 0 %   Eosinophils Absolute 0.0 0.0 - 0.5 K/uL   Basophils Relative 0 %   Basophils Absolute 0.0 0.0 - 0.1 K/uL   Immature Granulocytes 1 %   Abs Immature Granulocytes 0.07 0.00 - 0.07 K/uL  Magnesium     Status: None   Collection Time: 05/04/22  1:23 AM  Result Value Ref Range   Magnesium 2.1 1.7 - 2.4 mg/dL  Renal function panel     Status: Abnormal   Collection Time: 05/04/22  1:23 AM  Result Value Ref Range   Sodium 131 (L) 135 - 145 mmol/L   Potassium 4.2 3.5 - 5.1 mmol/L   Chloride 93 (L) 98 - 111 mmol/L   CO2 24 22 - 32 mmol/L   Glucose, Bld 127 (H) 70 - 99 mg/dL   BUN 92 (H) 8 - 23 mg/dL   Creatinine, Ser 7.82 (H) 0.44 - 1.00 mg/dL   Calcium 6.9 (L) 8.9 - 10.3 mg/dL   Phosphorus 8.7 (H) 2.5 - 4.6 mg/dL   Albumin 2.1 (L) 3.5 - 5.0 g/dL   GFR, Estimated 4  (L) >60 mL/min   Anion gap 14 5 - 15  Glucose, capillary     Status: Abnormal   Collection Time: 05/04/22  3:26 AM  Result Value Ref Range   Glucose-Capillary 143 (H) 70 - 99 mg/dL   Comment 1 Notify RN    Comment 2 Document in Chart     I have reviewed pertinent nursing notes, vitals, labs, and images as necessary. I have ordered labwork to follow up on as indicated.  I have reviewed the last notes from staff over past 24 hours. I have discussed patient's care plan and test results with nursing staff, CM/SW, and other staff as appropriate.  Time spent: Greater than 50% of the 55 minute visit was spent in counseling/coordination of care for the patient as laid out in the A&P.   LOS: 5 days   Lewie Chamber, MD [facility] 05/04/2022, 1:39 PM

## 2022-05-04 NOTE — Assessment & Plan Note (Addendum)
-   EKGs on 4/28 concerning for diffuse ST elevation vs early repol; patient also had some chest tightness at times -Echo showed moderate pericardial effusion, circumferential without evidence of tamponade - has been started on colchicine and discussed between cardiology and nephrology.  Patient will continue with 27-day course of colchicine at discharge (taking Tuesday and Friday).  No ibuprofen recommended -Outpatient follow-up for repeat echo in ~ 1 month

## 2022-05-05 ENCOUNTER — Encounter (HOSPITAL_COMMUNITY): Payer: Self-pay

## 2022-05-05 ENCOUNTER — Other Ambulatory Visit: Payer: Self-pay | Admitting: Cardiology

## 2022-05-05 ENCOUNTER — Other Ambulatory Visit (HOSPITAL_COMMUNITY): Payer: Self-pay

## 2022-05-05 ENCOUNTER — Telehealth (HOSPITAL_COMMUNITY): Payer: Self-pay | Admitting: Pharmacy Technician

## 2022-05-05 DIAGNOSIS — N185 Chronic kidney disease, stage 5: Secondary | ICD-10-CM | POA: Diagnosis not present

## 2022-05-05 DIAGNOSIS — R000 Tachycardia, unspecified: Secondary | ICD-10-CM

## 2022-05-05 DIAGNOSIS — I3139 Other pericardial effusion (noninflammatory): Secondary | ICD-10-CM

## 2022-05-05 DIAGNOSIS — I7122 Aneurysm of the aortic arch, without rupture: Secondary | ICD-10-CM | POA: Insufficient documentation

## 2022-05-05 DIAGNOSIS — N179 Acute kidney failure, unspecified: Secondary | ICD-10-CM | POA: Diagnosis not present

## 2022-05-05 DIAGNOSIS — I770 Arteriovenous fistula, acquired: Secondary | ICD-10-CM | POA: Insufficient documentation

## 2022-05-05 DIAGNOSIS — N189 Chronic kidney disease, unspecified: Secondary | ICD-10-CM | POA: Diagnosis not present

## 2022-05-05 DIAGNOSIS — I4892 Unspecified atrial flutter: Secondary | ICD-10-CM

## 2022-05-05 LAB — CBC WITH DIFFERENTIAL/PLATELET
Abs Immature Granulocytes: 0.08 10*3/uL — ABNORMAL HIGH (ref 0.00–0.07)
Basophils Absolute: 0 10*3/uL (ref 0.0–0.1)
Basophils Relative: 0 %
Eosinophils Absolute: 0 10*3/uL (ref 0.0–0.5)
Eosinophils Relative: 0 %
HCT: 26.7 % — ABNORMAL LOW (ref 36.0–46.0)
Hemoglobin: 8.3 g/dL — ABNORMAL LOW (ref 12.0–15.0)
Immature Granulocytes: 1 %
Lymphocytes Relative: 6 %
Lymphs Abs: 0.7 10*3/uL (ref 0.7–4.0)
MCH: 26.9 pg (ref 26.0–34.0)
MCHC: 31.1 g/dL (ref 30.0–36.0)
MCV: 86.4 fL (ref 80.0–100.0)
Monocytes Absolute: 1.3 10*3/uL — ABNORMAL HIGH (ref 0.1–1.0)
Monocytes Relative: 12 %
Neutro Abs: 8.7 10*3/uL — ABNORMAL HIGH (ref 1.7–7.7)
Neutrophils Relative %: 81 %
Platelets: 265 10*3/uL (ref 150–400)
RBC: 3.09 MIL/uL — ABNORMAL LOW (ref 3.87–5.11)
RDW: 17.8 % — ABNORMAL HIGH (ref 11.5–15.5)
WBC: 10.8 10*3/uL — ABNORMAL HIGH (ref 4.0–10.5)
nRBC: 0 % (ref 0.0–0.2)

## 2022-05-05 LAB — RENAL FUNCTION PANEL
Albumin: 2 g/dL — ABNORMAL LOW (ref 3.5–5.0)
Anion gap: 12 (ref 5–15)
BUN: 52 mg/dL — ABNORMAL HIGH (ref 8–23)
CO2: 27 mmol/L (ref 22–32)
Calcium: 7.5 mg/dL — ABNORMAL LOW (ref 8.9–10.3)
Chloride: 94 mmol/L — ABNORMAL LOW (ref 98–111)
Creatinine, Ser: 6.16 mg/dL — ABNORMAL HIGH (ref 0.44–1.00)
GFR, Estimated: 7 mL/min — ABNORMAL LOW (ref >60)
Glucose, Bld: 142 mg/dL — ABNORMAL HIGH (ref 70–99)
Phosphorus: 6.7 mg/dL — ABNORMAL HIGH (ref 2.5–4.6)
Potassium: 4.3 mmol/L (ref 3.5–5.1)
Sodium: 133 mmol/L — ABNORMAL LOW (ref 135–145)

## 2022-05-05 LAB — ECHOCARDIOGRAM COMPLETE
AV Mean grad: 4 mmHg
AV Peak grad: 7.5 mmHg
AV Vena cont: 0.5 cm
Ao pk vel: 1.37 m/s
Area-P 1/2: 3.23 cm2
P 1/2 time: 429 msec
S' Lateral: 2.2 cm

## 2022-05-05 LAB — GLUCOSE, CAPILLARY
Glucose-Capillary: 142 mg/dL — ABNORMAL HIGH (ref 70–99)
Glucose-Capillary: 170 mg/dL — ABNORMAL HIGH (ref 70–99)
Glucose-Capillary: 204 mg/dL — ABNORMAL HIGH (ref 70–99)

## 2022-05-05 LAB — MAGNESIUM: Magnesium: 1.9 mg/dL (ref 1.7–2.4)

## 2022-05-05 MED ORDER — APIXABAN 5 MG PO TABS: 5.0000 mg | ORAL_TABLET | Freq: Two times a day (BID) | ORAL | 3 refills | Status: DC

## 2022-05-05 MED ORDER — SENNOSIDES-DOCUSATE SODIUM 8.6-50 MG PO TABS: 1.0000 | ORAL_TABLET | Freq: Two times a day (BID) | ORAL | Status: DC | PRN

## 2022-05-05 MED ORDER — COLCHICINE 0.6 MG PO TABS: ORAL_TABLET | ORAL | 0 refills | Status: DC

## 2022-05-05 MED ORDER — COLCHICINE 0.6 MG PO TABS: 0.6000 mg | ORAL_TABLET | ORAL | Status: DC

## 2022-05-05 MED ORDER — POLYETHYLENE GLYCOL 3350 17 G PO PACK: 17.0000 g | PACK | Freq: Every day | ORAL | 0 refills | Status: DC | PRN

## 2022-05-05 MED ORDER — CALCIUM ACETATE (PHOS BINDER) 667 MG PO CAPS: 1334.0000 mg | ORAL_CAPSULE | Freq: Three times a day (TID) | ORAL | 3 refills | Status: DC

## 2022-05-05 MED ORDER — RENA-VITE PO TABS: 1.0000 | ORAL_TABLET | Freq: Every day | ORAL | 3 refills | Status: DC

## 2022-05-05 MED ORDER — AMLODIPINE BESYLATE 10 MG PO TABS: 10.0000 mg | ORAL_TABLET | Freq: Every day | ORAL | 3 refills | Status: DC

## 2022-05-05 NOTE — Plan of Care (Signed)

## 2022-05-05 NOTE — Assessment & Plan Note (Signed)
-   See pericarditis as well.  No signs of tamponade - Outpatient follow-up for repeat echo in approximately 1 month

## 2022-05-05 NOTE — Telephone Encounter (Signed)
Pharmacy Patient Advocate Encounter  Insurance verification completed.    The patient is insured through [employer] Part D   The patient is currently admitted and ran test claims for the following: Eliquis .  Copays and coinsurance results were relayed to Inpatient clinical team.      

## 2022-05-05 NOTE — Progress Notes (Signed)
l °

## 2022-05-05 NOTE — TOC Benefit Eligibility Note (Signed)
Patient Advocate Encounter  Insurance verification completed.    The patient is currently admitted and upon discharge could be taking Eliquis 5 mg.  The current 30 day co-pay is $40.00.   The patient is insured through [employer] Part D   This test claim was processed through [facility]- copay amounts may vary at other pharmacies due to pharmacy/plan contracts, or as the patient moves through the different stages of their insurance plan.  Michaelina Blandino, CPHT Pharmacy Patient Advocate Specialist [facility] Patient Advocate Team Direct Number: [phone]  Fax: [phone]       

## 2022-05-05 NOTE — Assessment & Plan Note (Signed)
-   per echo: "Aortic dilatation noted. Aneurysm of the aortic arch, measuring 43 mm. " - cardiology aware; outpatient surveillance; repeat echo or CT chest ~1 year

## 2022-05-05 NOTE — Discharge Summary (Signed)
Physician Discharge Summary   SHADANA PRY ZOX:[id number] DOB: [birth year] DOA: 04/29/2022  PCP: Dorothyann Peng, MD  Admit date: 04/29/2022 Discharge date: 05/05/2022  Admitted From: Home Disposition:  Home Discharging physician: Lewie Chamber, MD Barriers to discharge: none  Recommendations at discharge: Follow up with cardiology Repeat echo in ~1 month Repeat echo/CT chest approx April 2025 for aortic arch aneurysm  Adjust BP regimen if necessary  Discharge Condition: stable CODE STATUS: Full Diet recommendation:  Diet Orders (From admission, onward)     Start     Ordered   04/30/22 2003  Diet renal with fluid restriction Fluid restriction: 1200 mL Fluid; Room service appropriate? Yes; Fluid consistency: Thin  Diet effective now       Question Answer Comment  Fluid restriction: 1200 mL Fluid   Room service appropriate? Yes   Fluid consistency: Thin      04/30/22 2002            Hospital Course: Ms. Glasner is a 72 year old female with PMH CKD 5, anemia of chronic disease, DM II, glaucoma, HLD, HTN who presented with anorexia and fatigue.  She has been followed closely by nephrology outpatient.  She was sent to the ER from her doctor's office due to worsened kidney failure and in need of initiating dialysis. See below for further A&P  Assessment and Plan: * Acute renal failure superimposed on stage 5 chronic kidney disease, not on chronic dialysis [facility]) - Now progressed to ESRD - Underwent right PermCath placement 4/25 with vascular surgery along with left AV fistula creation - inpatient HD per nephrology.  Patient arranged for dialysis outpatient.  Going to [facility] at [facility]. Mon,Tues,Thurs,Fri schedule   Uremic pericarditis - EKGs on 4/28 concerning for diffuse ST elevation vs early repol; patient also had some chest tightness at times -Echo showed moderate pericardial effusion, circumferential without evidence of tamponade - has been started on colchicine and discussed  between cardiology and nephrology.  Patient will continue with 27-day course of colchicine at discharge (taking Tuesday and Friday).  No ibuprofen recommended -Outpatient follow-up for repeat echo in ~ 1 month  Tachyarrhythmia - Patient developed a tachyarrhythmia initially thought to be A-fib.  After further cardiology evaluation, this is suspected SVT versus atypical atrial flutter. No prior known history of A-fib or arrhythmias.  Suspect this is precipitated in setting of acute illness and/or pericardial effusion/pericarditis - Already on Coreg at home; dose increased given ongoing RVR - cardiology consulted given recurrent afib with mild RVR -Remains rhythm unaware -Patient recommended to continue on Eliquis at discharge per cardiology with outpatient follow-up and possible discontinuation if remaining sinus  ESRD on dialysis [facility]) - see acute on CKD5  Anemia of chronic disease - Baseline 9 to 10 g/dL - Currently at baseline  HTN (hypertension) - Continue Norvasc, Coreg, hydralazine  Fluid overload-resolved as of 04/30/2022 - See CKD/ESRD  AV fistula (HCC) - patient underwent left AV fistula creation on 4/25 with Dr. Randie Heinz - she did have mild numbness in left hand/wrist at discharge but had faint radial pulse, no pain, and hand was warm. Reassurance provided and patient still stable for d/c   Aortic arch aneurysm (HCC) - per echo: "Aortic dilatation noted. Aneurysm of the aortic arch, measuring 43 mm. " - cardiology aware; outpatient surveillance; repeat echo or CT chest ~1 year  Pericardial effusion - See pericarditis as well.  No signs of tamponade - Outpatient follow-up for repeat echo in approximately 1 month  Glaucoma - Continue eyedrops  HLD (hyperlipidemia) - No longer on Lipitor due to myalgias  DMII (diabetes mellitus, type 2) (HCC) - Continue home regimen   The patient's chronic medical conditions were treated accordingly per the patient's home medication  regimen except as noted.  On day of discharge, patient was felt deemed stable for discharge. Patient/family member advised to call PCP or come back to ER if needed.   Principal Diagnosis: Acute renal failure superimposed on stage 5 chronic kidney disease, not on chronic dialysis [facility])  Discharge Diagnoses: Active Hospital Problems   Diagnosis Date Noted   Acute renal failure superimposed on stage 5 chronic kidney disease, not on chronic dialysis (HCC) 01/27/2022    Priority: 1.   Uremic pericarditis 05/04/2022    Priority: 2.   Tachyarrhythmia 05/02/2022    Priority: 2.   ESRD on dialysis [facility]) 04/30/2022    Priority: 2.   Anemia of chronic disease 06/07/2016    Priority: 3.   HTN (hypertension) 03/05/2011    Priority: 3.   Pericardial effusion 05/05/2022   Aortic arch aneurysm (HCC) 05/05/2022   AV fistula (HCC) 05/05/2022   HLD (hyperlipidemia) 06/02/2016   Glaucoma 06/02/2016   DMII (diabetes mellitus, type 2) (HCC) 03/05/2011    Resolved Hospital Problems   Diagnosis Date Noted Date Resolved   Fluid overload 04/29/2022 04/30/2022    Priority: 4.     Discharge Instructions     Increase activity slowly   Complete by: As directed    No wound care   Complete by: As directed       Allergies as of 05/05/2022       Reactions   Atorvastatin Other (See Comments)   Myalgias- pt stopped taking    Shellfish Allergy Itching        Medication List     STOP taking these medications    sodium bicarbonate 650 MG tablet       TAKE these medications    Accu-Chek Guide test strip Generic drug: glucose blood USE AS DIRECTED TO CHECK BLOOD SUGARS 2 TIMES A DAY DX: E11.22   Accu-Chek Softclix Lancets lancets USE AS DIRECTED TO CHECK BLOOD SUGARS 2 TIMES A DAY DX: E11.22   albuterol 108 (90 Base) MCG/ACT inhaler Commonly known as: VENTOLIN HFA Inhale 2 puffs into the lungs every 6 (six) hours as needed for wheezing or shortness of breath.   amLODipine 10 MG  tablet Commonly known as: NORVASC Take 1 tablet (10 mg total) by mouth daily. Start taking on: May 06, 2022 What changed:  medication strength how much to take   apixaban 5 MG Tabs tablet Commonly known as: ELIQUIS Take 1 tablet (5 mg total) by mouth 2 (two) times daily.   ascorbic acid 500 MG tablet Commonly known as: VITAMIN C Take 1,000 mg by mouth daily.   atorvastatin 40 MG tablet Commonly known as: LIPITOR Take one tablet by mouth daily on Monday through Friday.   calcium acetate 667 MG capsule Commonly known as: PHOSLO Take 2 capsules (1,334 mg total) by mouth 3 (three) times daily with meals.   carvedilol 25 MG tablet Commonly known as: COREG Take 1 tablet (25 mg total) by mouth 2 (two) times daily. What changed: how much to take   colchicine 0.6 MG tablet Take 1 tablet only on Tuesday and Friday   hydrALAZINE 25 MG tablet Commonly known as: APRESOLINE Take 25 mg by mouth daily.   latanoprost 0.005 % ophthalmic solution Commonly known as: XALATAN Place 1 drop into  both eyes at bedtime.   multivitamin Tabs tablet Take 1 tablet by mouth at bedtime.   polyethylene glycol 17 g packet Commonly known as: MIRALAX / GLYCOLAX Take 17 g by mouth daily as needed for mild constipation or moderate constipation.   senna-docusate 8.6-50 MG tablet Commonly known as: Senokot-S Take 1 tablet by mouth 2 (two) times daily as needed for mild constipation.   timolol 0.5 % ophthalmic gel-forming Commonly known as: TIMOPTIC-XR Place 1 drop into both eyes in the morning.   Vitamin D (Ergocalciferol) 1.25 MG (50000 UNIT) Caps capsule Commonly known as: DRISDOL TAKE 1 CAPSULE BY MOUTH EVERY 7 DAYS        Follow-up Information     [facility] Follow up in 6 week(s).   Specialty: Vascular Surgery Why: Office will call you to arrange your appt (sent). Contact information: [address] [place]  [address] [phone]        Center, [facility]. Go on 05/18/2022.   Why: Transitional Care Unit- Schedule is Mon/Tues/Thurs/Fri with 12:40 chair time.  On Thursday (5/2), please arrive at 12:00 to complete paperwork prior to treatment. Contact information: [address] [place] [address] [phone]                Allergies  Allergen Reactions   Atorvastatin Other (See Comments)    Myalgias- pt stopped taking    Shellfish Allergy Itching    Consultations: Cardiology Nephrology  Procedures: 4/25: Procedure Performed: 1.  Right IJ 19 cm tunneled dialysis catheter placement with ultrasound fluoroscopic guidance 2.  Creation of left brachial artery to cephalic vein AV fistula with limited left brachial artery endarterectomy  Discharge Exam: BP (!) 107/58 (BP Location: Right Arm)   Pulse 75   Temp 97.8 F (36.6 C) (Oral)   Resp (!) 26   Ht 5\' 4"  (1.626 m)   Wt 65.9 kg   SpO2 93%   BMI 24.94 kg/m  Physical Exam Constitutional:      Appearance: Normal appearance.  HENT:     Head: Normocephalic and atraumatic.     Mouth/Throat:     Mouth: Mucous membranes are moist.  Eyes:     Extraocular Movements: Extraocular movements intact.  Cardiovascular:     Rate and Rhythm: Normal rate and regular rhythm.  Pulmonary:     Effort: Pulmonary effort is normal. No respiratory distress.     Breath sounds: Normal breath sounds. No wheezing.  Abdominal:     General: Bowel sounds are normal. There is no distension.     Palpations: Abdomen is soft.     Tenderness: There is no abdominal tenderness.  Musculoskeletal:        General: Normal range of motion.     Cervical back: Normal range of motion and neck supple.     Comments: Left AV fistula creation noted; faint left radial pulse intact. Left hand warm and non tender  Skin:    General: Skin is warm and dry.  Neurological:     General: No focal deficit present.     Mental Status: She is alert.     Comments:  Patient noting some mild numbness in left wrist/hand prior to d/c  Psychiatric:        Mood and Affect: Mood normal.      The results of significant diagnostics from this hospitalization (including imaging, microbiology, ancillary and laboratory) are listed below for reference.   Microbiology: Recent Results (from the  past 240 hour(s))  SARS Coronavirus 2 by RT PCR (hospital order, performed in [facility] hospital lab) *cepheid single result test* Anterior Nasal Swab     Status: None   Collection Time: 04/29/22  6:35 PM   Specimen: Anterior Nasal Swab  Result Value Ref Range Status   SARS Coronavirus 2 by RT PCR NEGATIVE NEGATIVE Final    Comment: Performed at [facility], [address] [place], [place] [address]     Labs: BNP (last 3 results) Recent Labs    04/21/22 2207 04/29/22 1843  BNP 1,129.4* 496.4*   Basic Metabolic Panel: Recent Labs  Lab 04/30/22 0124 04/30/22 0146 05/01/22 0130 05/02/22 0111 05/03/22 0108 05/04/22 0123 05/05/22 0109  NA 136   < > 139 137 135 131* 133*  K 5.2*   < > 3.4* 4.1 4.4 4.2 4.3  CL 103  --  99 100 95* 93* 94*  CO2 9*  --  16* 19* 23 24 27   GLUCOSE 119*  --  82 100* 150* 127* 142*  BUN 209*  --  127* 142* 79* 92* 52*  CREATININE 17.50*  --  11.33* 13.04* 8.21* 9.17* 6.16*  CALCIUM 7.0*  6.9*  --  7.4* 7.1* 7.3* 6.9* 7.5*  MG 2.6*  --  2.1 2.3 2.0 2.1 1.9  PHOS >30.0*  --   --  11.8* 7.0* 8.7* 6.7*   < > = values in this interval not displayed.   Liver Function Tests: Recent Labs  Lab 04/29/22 1843 04/29/22 2250 04/30/22 0124 05/02/22 0111 05/03/22 0108 05/04/22 0123 05/05/22 0109  AST 10*  --  11*  --   --   --   --   ALT 26  --  26  --   --   --   --   ALKPHOS 74  --  68  --   --   --   --   BILITOT 0.7  --  0.6  --   --   --   --   PROT 5.6*  --  5.9*  --   --   --   --   ALBUMIN 2.8*   < > 2.8* 2.3* 2.2* 2.1* 2.0*   < > = values in this interval not displayed.   No results for input(s): "LIPASE",  "AMYLASE" in the last 168 hours. No results for input(s): "AMMONIA" in the last 168 hours. CBC: Recent Labs  Lab 05/01/22 0130 05/02/22 0111 05/03/22 0108 05/04/22 0123 05/05/22 0109  WBC 7.4 7.0 9.1 10.2 10.8*  NEUTROABS 5.8 5.3 7.0 8.0* 8.7*  HGB 8.5* 8.3* 8.5* 8.2* 8.3*  HCT 25.3* 25.8* 26.8* 25.6* 26.7*  MCV 79.3* 82.7 84.0 85.3 86.4  PLT 299 286 221 239 265   Cardiac Enzymes: No results for input(s): "CKTOTAL", "CKMB", "CKMBINDEX", "TROPONINI" in the last 168 hours. BNP: Invalid input(s): "POCBNP" CBG: Recent Labs  Lab 05/04/22 2015 05/04/22 2329 05/05/22 0301 05/05/22 0818 05/05/22 1140  GLUCAP 145* 142* 142* 170* 204*   D-Dimer No results for input(s): "DDIMER" in the last 72 hours. Hgb A1c No results for input(s): "HGBA1C" in the last 72 hours. Lipid Profile No results for input(s): "CHOL", "HDL", "LDLCALC", "TRIG", "CHOLHDL", "LDLDIRECT" in the last 72 hours. Thyroid function studies No results for input(s): "TSH", "T4TOTAL", "T3FREE", "THYROIDAB" in the last 72 hours.  Invalid input(s): "FREET3" Anemia work up No results for input(s): "VITAMINB12", "FOLATE", "FERRITIN", "TIBC", "IRON", "RETICCTPCT" in the last 72 hours. Urinalysis  Component Value Date/Time   COLORURINE STRAW (A) 08/04/2017 1427   APPEARANCEUR CLEAR 08/04/2017 1427   LABSPEC 1.008 08/04/2017 1427   PHURINE 5.0 08/04/2017 1427   GLUCOSEU NEGATIVE 08/04/2017 1427   HGBUR SMALL (A) 08/04/2017 1427   BILIRUBINUR negative 11/04/2021 1114   KETONESUR NEGATIVE 08/04/2017 1427   PROTEINUR Positive (A) 11/04/2021 1114   PROTEINUR 100 (A) 08/04/2017 1427   UROBILINOGEN 0.2 11/04/2021 1114   NITRITE negative 11/04/2021 1114   NITRITE NEGATIVE 08/04/2017 1427   LEUKOCYTESUR Trace (A) 11/04/2021 1114   Sepsis Labs Recent Labs  Lab 05/02/22 0111 05/03/22 0108 05/04/22 0123 05/05/22 0109  WBC 7.0 9.1 10.2 10.8*   Microbiology Recent Results (from the past 240 hour(s))  SARS  Coronavirus 2 by RT PCR (hospital order, performed in [facility] hospital lab) *cepheid single result test* Anterior Nasal Swab     Status: None   Collection Time: 04/29/22  6:35 PM   Specimen: Anterior Nasal Swab  Result Value Ref Range Status   SARS Coronavirus 2 by RT PCR NEGATIVE NEGATIVE Final    Comment: Performed at [facility], [address], [place], [place] [address]    Procedures/Studies: ECHOCARDIOGRAM COMPLETE  Result Date: 05/05/2022    ECHOCARDIOGRAM REPORT   Patient Name:   MYRTIE LEUTHOLD Hankey Date of Exam: 05/04/2022 Medical Rec #:  [id number]    Height:       64.0 in Accession #:    [id number]   Weight:       145.3 lb Date of Birth:  1950/03/15    BSA:          1.708 m Patient Age:    72 years     BP:           126/65 mmHg Patient Gender: F            HR:           80 bpm. Exam Location:  Inpatient Procedure: 2D Echo, Cardiac Doppler and Color Doppler Indications:    A-Fib  History:        Patient has no prior history of Echocardiogram examinations. CKD                 3, Arrythmias:Atrial Fibrillation; Risk Factors:Dyslipidemia,                 Diabetes, Hypertension and Non-Smoker.  Sonographer:    Dondra Prader RVT RCS Referring Phys: [phone] Briyanna Billingham IMPRESSIONS  1. Left ventricular ejection fraction, by estimation, is 60 to 65%. The left ventricle has normal function. The left ventricle has no regional wall motion abnormalities. There is moderate left ventricular hypertrophy. Left ventricular diastolic parameters are indeterminate.  2. Right ventricular systolic function is normal. The right ventricular size is normal.  3. Moderate pericardial effusion. The pericardial effusion is circumferential. There is no evidence of cardiac tamponade.  4. The mitral valve is grossly normal. Mild mitral valve regurgitation.  5. The aortic valve is normal in structure. Aortic valve regurgitation is mild to moderate. Aortic valve sclerosis is present, with no evidence of aortic valve stenosis.  6.  Aortic dilatation noted. Aneurysm of the aortic arch, measuring 43 mm.  7. The inferior vena cava is dilated in size with <50% respiratory variability, suggesting right atrial pressure of 15 mmHg. Comparison(s): No prior Echocardiogram. FINDINGS  Left Ventricle: Left ventricular ejection fraction, by estimation, is 60 to 65%. The left ventricle has normal function. The left ventricle has no regional  wall motion abnormalities. The left ventricular internal cavity size was normal in size. There is  moderate left ventricular hypertrophy. Left ventricular diastolic parameters are indeterminate. Right Ventricle: The right ventricular size is normal. No increase in right ventricular wall thickness. Right ventricular systolic function is normal. Left Atrium: Left atrial size was normal in size. Right Atrium: Right atrial size was normal in size. Pericardium: A moderately sized pericardial effusion is present. The pericardial effusion is circumferential. There is no evidence of cardiac tamponade. Mitral Valve: The mitral valve is grossly normal. Mild mitral valve regurgitation. Tricuspid Valve: The tricuspid valve is grossly normal. Tricuspid valve regurgitation is mild. Aortic Valve: The aortic valve is normal in structure. Aortic valve regurgitation is mild to moderate. Aortic regurgitation PHT measures 429 msec. Aortic valve sclerosis is present, with no evidence of aortic valve stenosis. Aortic valve mean gradient measures 4.0 mmHg. Aortic valve peak gradient measures 7.5 mmHg. Pulmonic Valve: The pulmonic valve was normal in structure. Pulmonic valve regurgitation is not visualized. No evidence of pulmonic stenosis. Aorta: The aortic root is normal in size and structure and aortic dilatation noted. There is an aneurysm involving the aortic arch measuring 43 mm. Venous: The inferior vena cava is dilated in size with less than 50% respiratory variability, suggesting right atrial pressure of 15 mmHg. IAS/Shunts: The  interatrial septum was not assessed.  LEFT VENTRICLE PLAX 2D LVIDd:         3.30 cm   Diastology LVIDs:         2.20 cm   LV e' medial:    8.38 cm/s LV PW:         1.50 cm   LV E/e' medial:  7.5 LV IVS:        0.80 cm   LV e' lateral:   4.79 cm/s LVOT diam:     1.80 cm   LV E/e' lateral: 13.2 LVOT Area:     2.54 cm  RIGHT VENTRICLE             IVC RV Basal diam:  3.65 cm     IVC diam: 2.60 cm RV Mid diam:    2.70 cm RV S prime:     14.00 cm/s LEFT ATRIUM             Index        RIGHT ATRIUM           Index LA diam:        3.00 cm 1.76 cm/m   RA Area:     14.50 cm LA Vol (A2C):   54.3 ml 31.79 ml/m  RA Volume:   36.00 ml  21.08 ml/m LA Vol (A4C):   44.6 ml 26.11 ml/m LA Biplane Vol: 50.1 ml 29.33 ml/m  AORTIC VALVE                   PULMONIC VALVE AV Vmax:           136.67 cm/s PV Vmax:       0.90 m/s AV Vmean:          92.667 cm/s PV Peak grad:  3.2 mmHg AV VTI:            0.245 m AV Peak Grad:      7.5 mmHg AV Mean Grad:      4.0 mmHg AI PHT:            429 msec AR Vena Contracta: 0.50 cm  AORTA Ao Root diam:  3.00 cm Ao Asc diam:  3.50 cm Ao Arch diam: 4.3 cm MITRAL VALVE               TRICUSPID VALVE MV Area (PHT): 3.23 cm    TR Peak grad:   26.4 mmHg MV Decel Time: 235 msec    TR Vmax:        257.00 cm/s MV E velocity: 63.27 cm/s MV A velocity: 78.93 cm/s  SHUNTS MV E/A ratio:  0.80        Systemic Diam: 1.80 cm Truett Mainland MD Electronically signed by Truett Mainland MD Signature Date/Time: 05/05/2022/9:30:36 AM    Final    DG Chest Port 1 View  Result Date: 04/30/2022 CLINICAL DATA:  Status post dialysis catheter insertion EXAM: PORTABLE CHEST 1 VIEW COMPARISON:  X-ray 04/29/2022 FINDINGS: Enlarged cardiopericardial silhouette with some slight central vascular congestion. Question trace edema. Small left effusion with some adjacent more focal opacity. Atelectasis versus infiltrate. No pneumothorax. No right-sided effusion. There is right IJ double-lumen catheter with tip along the central  SVC. Diffuse degenerative changes of the spine. IMPRESSION: New right IJ catheter.  No pneumothorax. Enlarged heart with some central vascular congestion. Question trace edema. Left retrocardiac opacity and small effusion.  Recommend follow-up Electronically Signed   By: Karen Kays M.D.   On: 04/30/2022 15:39   DG C-Arm 1-60 Min-No Report  Result Date: 04/30/2022 Fluoroscopy was utilized by the requesting physician.  No radiographic interpretation.   DG Chest 1 View  Result Date: 04/29/2022 CLINICAL DATA:  Shortness of breath EXAM: CHEST  1 VIEW COMPARISON:  04/21/2022 FINDINGS: Transverse diameter of heart is increased. Central pulmonary vessels are prominent. There are no signs of alveolar pulmonary edema. Small bilateral pleural effusions are seen, more so on the left side with interval increase. Evaluation of lower lung fields for infiltrates is limited by pleural effusions. There is no pneumothorax. IMPRESSION: Cardiomegaly. Central pulmonary vessels are prominent without signs of alveolar pulmonary edema. Small bilateral pleural effusions, more so on the left side with interval increase. Electronically Signed   By: Ernie Avena M.D.   On: 04/29/2022 19:17   DG Chest 2 View  Result Date: 04/21/2022 CLINICAL DATA:  Chest pain EXAM: CHEST - 2 VIEW COMPARISON:  04/04/2022 FINDINGS: The cardiac silhouette, mediastinal and hilar contours are within normal limits and stable. Streaky bibasilar atelectasis but no infiltrates or effusions. No pneumothorax. The bony thorax is intact. IMPRESSION: Streaky bibasilar atelectasis. Electronically Signed   By: Rudie Meyer M.D.   On: 04/21/2022 16:42   VAS [place] UPPER EXTREMITY ARTERIAL DUPLEX  Result Date: 04/06/2022  UPPER EXTREMITY DUPLEX STUDY Patient Name:  ARTESIA BERKEY Wentzell  Date of Exam:   04/06/2022 Medical Rec #: [id number]     Accession #:    [id number] Date of Birth: 1950-07-27     Patient Gender: F Patient Age:   66 years Exam Location:  [facility] Procedure:      VAS [place] UPPER EXTREMITY ARTERIAL DUPLEX Referring Phys: Lemar Livings --------------------------------------------------------------------------------  Indications: Pre-hemodialysis access evaluation.  Comparison Study: No prior study Performing Technologist: Gertie Fey MHA, RDMS, RVT, RDCS  Examination Guidelines: A complete evaluation includes B-mode imaging, spectral Doppler, color Doppler, and power Doppler as needed of all accessible portions of each vessel. Bilateral testing is considered an integral part of a complete examination. Limited examinations for reoccurring indications may be performed as noted.  Right Pre-Dialysis Findings: +-----------------------+----------+--------------------+---------+--------+ Location  PSV (cm/s)Intralum. Diam. (cm)Waveform Comments +-----------------------+----------+--------------------+---------+--------+ Brachial Antecub. fossa96        0.45                biphasic          +-----------------------+----------+--------------------+---------+--------+ Radial Art at Wrist    90        0.16                triphasic         +-----------------------+----------+--------------------+---------+--------+ Ulnar Art at Wrist     80        0.13                triphasic         +-----------------------+----------+--------------------+---------+--------+  Left Pre-Dialysis Findings: +-----------------------+----------+--------------------+--------+--------+ Location               PSV (cm/s)Intralum. Diam. (cm)WaveformComments +-----------------------+----------+--------------------+--------+--------+ Brachial Antecub. fossa76        0.35                biphasic         +-----------------------+----------+--------------------+--------+--------+ Radial Art at Wrist    91        0.17                biphasic         +-----------------------+----------+--------------------+--------+--------+ Ulnar  Art at Wrist     83        0.18                biphasic         +-----------------------+----------+--------------------+--------+--------+  Summary:  Right: No obstruction visualized in the right upper extremity. Left: No obstruction visualized in the left upper extremity. *See table(s) above for measurements and observations. Electronically signed by Sherald Hess MD on 04/06/2022 at 1:29:12 PM.    Final    VAS [place] UPPER EXT VEIN MAPPING (PRE-OP AVF)  Result Date: 04/06/2022 UPPER EXTREMITY VEIN MAPPING Patient Name:  ANNACLAIRE WALSWORTH Forlenza  Date of Exam:   04/06/2022 Medical Rec #: [id number]     Accession #:    [id number] Date of Birth: 06-07-1950     Patient Gender: F Patient Age:   30 years Exam Location:  [facility] Procedure:      VAS [place] UPPER EXT VEIN MAPPING (PRE-OP AVF) Referring Phys: Lemar Livings --------------------------------------------------------------------------------  Indications: Pre-access. Comparison Study: No prior study Performing Technologist: Gertie Fey MHA, RDMS, RVT, RDCS  Examination Guidelines: A complete evaluation includes B-mode imaging, spectral Doppler, color Doppler, and power Doppler as needed of all accessible portions of each vessel. Bilateral testing is considered an integral part of a complete examination. Limited examinations for reoccurring indications may be performed as noted. +-----------------+-------------+----------+---------+ Right Cephalic   Diameter (cm)Depth (cm)Findings  +-----------------+-------------+----------+---------+ Shoulder             0.30                         +-----------------+-------------+----------+---------+ Prox upper arm       0.26                         +-----------------+-------------+----------+---------+ Mid upper arm        0.27                         +-----------------+-------------+----------+---------+ Dist upper arm       0.37  branching  +-----------------+-------------+----------+---------+ Antecubital fossa    0.44               branching +-----------------+-------------+----------+---------+ Prox forearm         0.22                         +-----------------+-------------+----------+---------+ Mid forearm          0.21                         +-----------------+-------------+----------+---------+ Dist forearm         0.13               branching +-----------------+-------------+----------+---------+ +-----------------+-------------+----------+---------+ Right Basilic    Diameter (cm)Depth (cm)Findings  +-----------------+-------------+----------+---------+ Prox upper arm       0.50                         +-----------------+-------------+----------+---------+ Mid upper arm        0.48               branching +-----------------+-------------+----------+---------+ Dist upper arm       0.28                         +-----------------+-------------+----------+---------+ Antecubital fossa    0.24                         +-----------------+-------------+----------+---------+ +-----------------+-------------+----------+---------+ Left Cephalic    Diameter (cm)Depth (cm)Findings  +-----------------+-------------+----------+---------+ Shoulder             0.29                         +-----------------+-------------+----------+---------+ Prox upper arm       0.32                         +-----------------+-------------+----------+---------+ Mid upper arm        0.33                         +-----------------+-------------+----------+---------+ Dist upper arm       0.31               branching +-----------------+-------------+----------+---------+ Antecubital fossa    0.57               branching +-----------------+-------------+----------+---------+ Prox forearm         0.25                         +-----------------+-------------+----------+---------+ Mid forearm           0.22                         +-----------------+-------------+----------+---------+ Wrist                0.15                         +-----------------+-------------+----------+---------+ +-----------------+-------------+----------+---------+ Left Basilic     Diameter (cm)Depth (cm)Findings  +-----------------+-------------+----------+---------+ Mid upper arm        0.35                         +-----------------+-------------+----------+---------+ Dist upper arm  0.37                         +-----------------+-------------+----------+---------+ Antecubital fossa    0.27               branching +-----------------+-------------+----------+---------+ Prox forearm         0.16                         +-----------------+-------------+----------+---------+ *See table(s) above for measurements and observations.  Diagnosing physician: Sherald Hess MD Electronically signed by Sherald Hess MD on 04/06/2022 at 1:28:59 PM.    Final      Time coordinating discharge: Over 30 minutes    Lewie Chamber, MD  [facility] 05/05/2022, 12:56 PM

## 2022-05-05 NOTE — Progress Notes (Addendum)
Subjective:  Feels generalized weakness, unchanged from before. At chest tightness last night, currently absent.  Reviewed echocardiogram, details below.   Current Facility-Administered Medications:    0.9 %  sodium chloride infusion, 250 mL, Intravenous, PRN, Rhyne, Samantha J, PA-C   acetaminophen (TYLENOL) tablet 650 mg, 650 mg, Oral, Q6H PRN, 650 mg at 05/01/22 0443 **OR** acetaminophen (TYLENOL) suppository 650 mg, 650 mg, Rectal, Q6H PRN, Rhyne, Samantha J, PA-C   albuterol (VENTOLIN HFA) 108 (90 Base) MCG/ACT inhaler 2 puff, 2 puff, Inhalation, Q6H PRN, Rhyne, Samantha J, PA-C   alteplase (CATHFLO ACTIVASE) injection 2 mg, 2 mg, Intracatheter, Once PRN, Rhyne, Samantha J, PA-C   amLODipine (NORVASC) tablet 10 mg, 10 mg, Oral, Daily, Lewie Chamber, MD, 10 mg at 05/04/22 1334   anticoagulant sodium citrate solution 5 mL, 5 mL, Intracatheter, PRN, Rhyne, Samantha J, PA-C   apixaban (ELIQUIS) tablet 5 mg, 5 mg, Oral, BID, Pierce, Dwayne A, RPH, 5 mg at 05/04/22 2244   calcium acetate (PHOSLO) capsule 1,334 mg, 1,334 mg, Oral, TID WC, Annie Sable, MD, 1,334 mg at 05/04/22 1724   carvedilol (COREG) tablet 25 mg, 25 mg, Oral, BID, Lewie Chamber, MD, 25 mg at 05/04/22 2244   Chlorhexidine Gluconate Cloth 2 % PADS 6 each, 6 each, Topical, Q0600, Annie Sable, MD, 6 each at 05/05/22 0540   colchicine tablet 0.6 mg, 0.6 mg, Oral, Once per day on Tue Fri, Delano Metz, MD   Darbepoetin Alfa (ARANESP) injection 100 mcg, 100 mcg, Subcutaneous, Q Sat-1800, Annie Sable, MD, 100 mcg at 05/02/22 1759   doxercalciferol (HECTOROL) injection 2 mcg, 2 mcg, Intravenous, Q T,Th,Sa-HD, Annie Sable, MD, 2 mcg at 05/02/22 0857   feeding supplement (NEPRO CARB STEADY) liquid 237 mL, 237 mL, Oral, Q24H, Lewie Chamber, MD, 237 mL at 05/04/22 1400   heparin injection 1,000 Units, 1,000 Units, Intracatheter, PRN, Rhyne, Samantha J, PA-C, 1,000 Units at 05/04/22 1220    hydrALAZINE (APRESOLINE) tablet 25 mg, 25 mg, Oral, Q4H PRN, Lewie Chamber, MD   HYDROcodone-acetaminophen (NORCO/VICODIN) 5-325 MG per tablet 1-2 tablet, 1-2 tablet, Oral, Q4H PRN, Rhyne, Samantha J, PA-C, 2 tablet at 05/03/22 1347   insulin aspart (novoLOG) injection 0-6 Units, 0-6 Units, Subcutaneous, Q4H, Rhyne, Samantha J, PA-C, 2 Units at 05/03/22 1320   labetalol (NORMODYNE) injection 10 mg, 10 mg, Intravenous, Q4H PRN, Lewie Chamber, MD   latanoprost (XALATAN) 0.005 % ophthalmic solution 1 drop, 1 drop, Both Eyes, QHS, Rhyne, Samantha J, PA-C, 1 drop at 05/04/22 2245   lidocaine (PF) (XYLOCAINE) 1 % injection 5 mL, 5 mL, Intradermal, PRN, Rhyne, Samantha J, PA-C   lidocaine-prilocaine (EMLA) cream 1 Application, 1 Application, Topical, PRN, Rhyne, Samantha J, PA-C   lip balm (CARMEX) ointment, , Topical, PRN, Lewie Chamber, MD   metoprolol tartrate (LOPRESSOR) injection 5 mg, 5 mg, Intravenous, Q2H PRN, Lewie Chamber, MD   multivitamin (RENA-VIT) tablet 1 tablet, 1 tablet, Oral, QHS, Lewie Chamber, MD, 1 tablet at 05/04/22 2244   ondansetron (ZOFRAN) tablet 4 mg, 4 mg, Oral, Q6H PRN, 4 mg at 05/03/22 1750 **OR** ondansetron (ZOFRAN) injection 4 mg, 4 mg, Intravenous, Q6H PRN, Rhyne, Samantha J, PA-C   pentafluoroprop-tetrafluoroeth (GEBAUERS) aerosol 1 Application, 1 Application, Topical, PRN, Rhyne, Samantha J, PA-C   polyethylene glycol (MIRALAX / GLYCOLAX) packet 17 g, 17 g, Oral, Daily PRN, Lewie Chamber, MD   senna-docusate (Senokot-S) tablet 1 tablet, 1 tablet, Oral, BID PRN, Lewie Chamber, MD   sodium bicarbonate tablet 1,300 mg, 1,300 mg, Oral, TID,  Dara Lords, PA-C, 1,300 mg at 05/04/22 2243   sodium chloride flush (NS) 0.9 % injection 3 mL, 3 mL, Intravenous, Q12H, Rhyne, Samantha J, PA-C, 3 mL at 05/04/22 2244   sodium chloride flush (NS) 0.9 % injection 3 mL, 3 mL, Intravenous, PRN, Rhyne, Samantha J, PA-C   timolol (TIMOPTIC-XR) 0.5 % ophthalmic gel-forming 1 drop,  1 drop, Both Eyes, q AM, Rhyne, Samantha J, PA-C, 1 drop at 05/05/22 0540   Objective:  Vital Signs in the last 24 hours: Temp:  [98 F (36.7 C)-99.8 F (37.7 C)] 98.3 F (36.8 C) (04/30 0818) Pulse Rate:  [59-87] 79 (04/30 0818) Resp:  [13-23] 23 (04/30 0818) BP: (95-150)/(52-96) 95/64 (04/30 0818) SpO2:  [88 %-100 %] 89 % (04/30 0818) Weight:  [65.9 kg] 65.9 kg (04/29 1224)  Intake/Output from previous day: 04/29 0701 - 04/30 0700 In: 480 [P.O.:480] Out: 1000   Physical Exam Vitals and nursing note reviewed.  Constitutional:      General: She is not in acute distress. Neck:     Vascular: No JVD.  Cardiovascular:     Rate and Rhythm: Normal rate and regular rhythm.     Heart sounds: No murmur heard.    No friction rub.  Pulmonary:     Effort: Pulmonary effort is normal.     Breath sounds: Normal breath sounds. No wheezing or rales.  Musculoskeletal:     Right lower leg: No edema.     Left lower leg: No edema.      Imaging/tests reviewed and independently interpreted: CXR 04/30/2022: New right IJ catheter.  No pneumothorax. Enlarged heart with some central vascular congestion. Question trace edema. Left retrocardiac opacity and small effusion.  Recommend follow-up      Cardiac Studies:  Telemetry 05/05/2022: Sinus rhythm  EKG 05/03/2022: Sinus rhythm with Premature supraventricular complexes Possible Acute pericarditis Abnormal ECG Since previous tracing Premature atrial complexes Confirmed by Camnitz, Will ([id number]) on 05/04/2022 8:  EKG 05/02/2022 15:44: SVT, consider atypical flutter versus atrial tachycardia ST elevation, consider early repolarization, pericarditis, or injury Abnormal ECG When compared with ECG of 02-May-2022 15:40, PREVIOUS ECG IS PRESENT Confirmed by Marca Ancona [phone] on 05/03/2022 6:43:13 PM  Echocardiogram 05/04/2022:  1. Left ventricular ejection fraction, by estimation, is 60 to 65%. The  left ventricle has normal function.  The left ventricle has no regional  wall motion abnormalities. There is moderate left ventricular hypertrophy.  Left ventricular diastolic  parameters are indeterminate.   2. Right ventricular systolic function is normal. The right ventricular  size is normal.   3. Moderate pericardial effusion. The pericardial effusion is  circumferential. There is no evidence of cardiac tamponade.   4. The mitral valve is grossly normal. Mild mitral valve regurgitation.   5. The aortic valve is normal in structure. Aortic valve regurgitation is  mild to moderate. Aortic valve sclerosis is present, with no evidence of  aortic valve stenosis.   6. Aortic dilatation noted. Aneurysm of the aortic arch, measuring 43 mm.   7. The inferior vena cava is dilated in size with <50% respiratory  variability, suggesting right atrial pressure of 15 mmHg.   Comparison(s): No prior Echocardiogram.     Assessment & Recommendations:  72 year old African-American Cummings with hypertension, type 2 diabetes mellitus, hyperlipidemia, CKD stage V progressing to dialysis, noted with tachycardia moderate to large pericardial effusion.  Tachycardia: Possibly SVT vs atypical atrial flutter noted for brief duration on 05/02/2022, converted to sinus rhythm with IV amiodarone.  CHA2DS2VASc score 3, annual stroke risk 3.2%. Contineu Eliquis 5 mg bid. I do not think the tachcyardia was Afib, but rather atypical atrial flutter. It may be reasonable to stop anticoagulation after 4-6 weeks, unless there is any recurrence. I will place on outpatient cardiac telemetry for two weeks.   Pericarditis/pericardial effusion: Occasional chest pain, diffuse ST elevation on EKG. Echocardiogram with moderate to large circumferential pericardial effusion with no definite signs of tamponade. No immediate need for pericardiocentesis at this time.  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 I anticipate this will improve with initiation of dialysis. I  discussed with nephrologist Dr. Arlean Hopping. We anticipate that it may improve over the next month or so. I will repeat outpatient echocardiogram in two weeks. Okay to use renally doses colchicine for now.   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 Continue HR and BP control. Can be followed outpatient with repeat echocardiogram or CT chest in 1 year (around 04/2023).  Discussed interpretation of tests and management recommendations with the primary team   Elder Negus, MD Pager: [phone] Office: [phone]

## 2022-05-05 NOTE — Progress Notes (Signed)
Subjective:  seen in room, had some chest pressure last night. Eager to get up and walk.   Objective Vital signs in last 24 hours: Vitals:   05/05/22 0259 05/05/22 0545 05/05/22 0818 05/05/22 1139  BP: 119/78  95/64 (!) 107/58  Pulse: 84 78 79 75  Resp: 18 20 (!) 23 (!) 26  Temp: 98.9 F (37.2 C)  98.3 F (36.8 C) 97.8 F (36.6 C)  TempSrc: Oral  Oral Oral  SpO2: 92% 94% (!) 89% 93%  Weight:      Height:       Weight change: -2.3 kg  Intake/Output Summary (Last 24 hours) at 05/05/2022 1215 Last data filed at 05/05/2022 0940 Gross per 24 hour  Intake 600 ml  Output 1000 ml  Net -400 ml   Physical Exam: General: stable adult AAF in no acute distress  Heart: Regular rate and rhythm Lungs: Mostly clear Abdomen: Soft, nontender, nondistended Extremities: Trace to 1+ edema Dialysis Access: left AVF-  good bruit-  right sided [facility]   Assessment/ Plan: Pt is a 72 y.o. yo female with stage V CKD who was admitted on 04/29/2022 with uremia and needing to start dialysis   Assessment/Plan: 1.  Uremia due to now ESRD-this has progressed pretty significantly in the last 6 weeks.  We had planned to place fistula as OP but now needed to place a fistula and TDC to start dialysis for her uremic symptoms.  S/p [facility] and AVF as well as first dialysis treatment 4/25, second tx 4/27.  establishing with an outpatient dialysis unit upon discharge. Had HD #3 yesterday.  She will be going to TCU at [facility]) on a Mon,Tues,Thurs,Fri schedule. Pt can start on Thursday and will need to arrive at 12:00 for 12:40 chair time. OK for dc.  2.  Metabolic acidosis- resolved w/ HD 3. Anemia-has been on ESA and iron as an outpatient for a long time.  Continue with these treatments 4. Secondary hyperparathyroidism- last PTH 390 and with low calcium -  started vitamin D with dialysis.  Patient will also need a phosphate binder, will start with calcium based- phos improved 5. HTN/volume-continue with home Norvasc,  Coreg .  Will eval for possibly dec amlodipine as OP  6. Pericarditis - based on EKG changes and chest pains on admission. Have d/w cardiology yest and today. ECHO is showing pericardial effusion w/o tamponade. Chest pains are better but still present. Typically dialysis treats the cause of these effusions, but the time frame for resolution of the effusion I can't say. Per cards they will get a repeat ECHO in 2 weeks and they agree w/ using colchicine at renal dosing for 4 weeks.   Vinson Moselle, MD 05/05/2022, 12:15 PM  Recent Labs  Lab 05/04/22 0123 05/05/22 0109  HGB 8.2* 8.3*  ALBUMIN 2.1* 2.0*  CALCIUM 6.9* 7.5*  PHOS 8.7* 6.7*  CREATININE 9.17* 6.16*  K 4.2 4.3     Inpatient medications:  amLODipine  10 mg Oral Daily   apixaban  5 mg Oral BID   calcium acetate  1,334 mg Oral TID WC   carvedilol  25 mg Oral BID   Chlorhexidine Gluconate Cloth  6 each Topical Q0600   colchicine  0.6 mg Oral Once per day on Tue Fri   darbepoetin (ARANESP) injection - DIALYSIS  100 mcg Subcutaneous Q Sat-1800   doxercalciferol  2 mcg Intravenous Q T,Th,Sa-HD   feeding supplement (NEPRO CARB STEADY)  237 mL Oral Q24H  insulin aspart  0-6 Units Subcutaneous Q4H   latanoprost  1 drop Both Eyes QHS   multivitamin  1 tablet Oral QHS   sodium bicarbonate  1,300 mg Oral TID   sodium chloride flush  3 mL Intravenous Q12H   timolol  1 drop Both Eyes q AM    sodium chloride     anticoagulant sodium citrate     sodium chloride, acetaminophen **OR** acetaminophen, albuterol, alteplase, anticoagulant sodium citrate, heparin, hydrALAZINE, HYDROcodone-acetaminophen, labetalol, lidocaine (PF), lidocaine-prilocaine, lip balm, metoprolol tartrate, ondansetron **OR** ondansetron (ZOFRAN) IV, pentafluoroprop-tetrafluoroeth, polyethylene glycol, senna-docusate, sodium chloride flush

## 2022-05-05 NOTE — Assessment & Plan Note (Signed)
-   patient underwent left AV fistula creation on 4/25 with Dr. Randie Heinz - she did have mild numbness in left hand/wrist at discharge but had faint radial pulse, no pain, and hand was warm. Reassurance provided and patient still stable for d/c

## 2022-05-05 NOTE — TOC Transition Note (Signed)
Transition of Care (TOC) - CM/SW Discharge Note Donn Pierini RN, BSN Transitions of Care Unit 4E- RN Case Manager See Treatment Team for direct phone #   Patient Details  Name: Michelle Cummings MRN: [id number] Date of Birth: 1950-07-29  Transition of Care [facility]) CM/SW Contact:  Darrold Span, RN Phone Number: 05/05/2022, 1:25 PM   Clinical Narrative:    Pt stable for transition home today, outpt HD has been arranged per renal navigator. Info provided to pt.  Pt new to Eliquis- per benefits check copay $40, PharmD has educated and provided 30 day free coupon to take to [facility] pharmacy to use on discharge for starter pack.   No further TOC needs noted. Family to transport home   Final next level of care: Home/Self Care Barriers to Discharge: No Barriers Identified   Patient Goals and CMS Choice   Choice offered to / list presented to : NA  Discharge Placement               Home          Discharge Plan and Services Additional resources added to the After Visit Summary for   In-house Referral: NA Discharge Planning Services: NA Post Acute Care Choice: NA                               Social Determinants of Health (SDOH) Interventions SDOH Screenings   Food Insecurity: No Food Insecurity (05/02/2022)  Housing: Low Risk  (05/02/2022)  Transportation Needs: No Transportation Needs (05/02/2022)  Utilities: Not At Risk (05/02/2022)  Depression (PHQ2-9): Low Risk  (04/21/2022)  Financial Resource Strain: Low Risk  (11/20/2021)  Physical Activity: Sufficiently Active (11/20/2021)  Stress: No Stress Concern Present (11/20/2021)  Tobacco Use: Low Risk  (05/01/2022)     Readmission Risk Interventions    05/05/2022    1:25 PM  Readmission Risk Prevention Plan  Transportation Screening Complete  PCP or Specialist Appt within 3-5 Days Complete  HRI or Home Care Consult Complete  Social Work Consult for Recovery Care Planning/Counseling Complete   Palliative Care Screening Not Applicable  Medication Review Oceanographer) Complete

## 2022-05-05 NOTE — Progress Notes (Signed)
New Dialysis Start    Patient identified as new dialysis start. Kidney Education packet assembled and given. Discussed the following items with patient:     Current medications and possible changes once started:  Discussed that patient's medications may change over time.  Ex; hypertension medications and diabetes medication.  Nephrologists will adjust as needed.   Fluid restrictions reviewed:  32 oz daily goal:  All liquids count; soups, ice, jello, fruits.    Phosphorus and potassium: Handout given showing high potassium and phosphorus foods.  Alternative food and drink options given.   Family support:  None at bedside.   Outpatient Clinic Resources:  Discussed roles of Outpatient clinic staff and advised to make a list of needs, if any, to talk with outpatient staff if needed.   Care plan schedule: Informed patient of Care Plans in outpatient setting and to participate in the care plan.  An invitation would be given from outpatient clinic.    Dialysis Access Options:  Reviewed access options with patients. Discussed in detail about care at home with new AVG & AVF. Reviewed checking bruit and thrill. If dialysis catheter present, educated that patient could not take showers.  Catheter dressing changes were to be done by outpatient clinic staff only   Home therapy options:  Educated patient about home therapy options:  PD vs home hemo.     Patient verbalized understanding. Will continue to round on patient during admission.  Patient will be going to TCU at [facility] for dialysis education on a Mon, Tues, Thurs, Fri schedule.  Jean Rosenthal Dialysis Nurse Coordinator [phone]

## 2022-05-05 NOTE — Progress Notes (Addendum)
Met with pt at bedside this am to discuss out-pt HD arrangements. Pt prefers TCU at [facility] for dialysis education. Pt has been accepted at TCU at [facility] on Mon,Tues, Thurs,Fri schedule. Pt can start on Thursday and will need to arrive at 12:00 for 12:40 chair time. Pt aware of arrangements and schedule letter provided. Arrangements added to AVS as well. Pt agreeable to plan. Update provided to attending, nephrologist, pt's RN, and RN CM. Will assist as needed.   Olivia Canter Renal Navigator [phone]  Addendum at 1:23 pm: D/C order noted. Contacted Bobbie at [place] to advise clinic of pt's d/c today and that pt will start on Thursday. Contacted renal PA regarding clinic's need for orders.

## 2022-05-05 NOTE — Progress Notes (Signed)
Discharge instructions given patient verbalized understanding and all questions were answered.

## 2022-05-06 ENCOUNTER — Telehealth: Payer: Self-pay

## 2022-05-06 DIAGNOSIS — N186 End stage renal disease: Secondary | ICD-10-CM

## 2022-05-06 NOTE — Telephone Encounter (Signed)
Location of hospitalization: [place] Reason for hospitalization: SOB Date of discharge: 05/05/2022 Date of first communication with patient: today Person contacting patient: Me Current symptoms: Tired Do you understand why you were in the Hospital: Yes Questions regarding discharge instructions: None Where were you discharged to: Home Medications reviewed: Yes Allergies reviewed: Yes Dietary changes reviewed: Yes. Discussed low fat and low salt diet.  Referals reviewed: NA Activities of Daily Living: Able to with mild limitations Any transportation issues/concerns: None Any patient concerns: None Confirmed importance & date/time of Follow up appt: Yes Confirmed with patient if condition begins to worsen call. Pt was given the office number and encouraged to call back with questions or concerns: Yes

## 2022-05-06 NOTE — Transitions of Care (Post Inpatient/ED Visit) (Signed)
05/06/2022  Name: Michelle Cummings MRN: [id number] DOB: January 13, 1950  Today's TOC FU Call Status: Today's TOC FU Call Status:: Successful TOC FU Call Competed TOC FU Call Complete Date: 05/06/22  Transition Care Management Follow-up Telephone Call Date of Discharge: 05/05/22 Discharge Facility: [facility]) Type of Discharge: Inpatient Admission Primary Inpatient Discharge Diagnosis:: "stage 5 CKD" How have you been since you were released from the hospital?: Better (Pt states she is doing okay-rested fairly well last night. Appetite has been decreased-eating small snacks. She has not checked cbg yet.) Any questions or concerns?: No  Items Reviewed: Did you receive and understand the discharge instructions provided?: Yes Medications obtained,verified, and reconciled?: Yes (Medications Reviewed) Any new allergies since your discharge?: No Dietary orders reviewed?: Yes Type of Diet Ordered:: renal/carb modified Do you have support at home?: Yes People in Home: child(ren), adult Name of Support/Comfort Primary Source: Michelle Cummings-dtr  Medications Reviewed Today: Medications Reviewed Today     Reviewed by Michelle Minerva, RN (Registered Nurse) on 05/06/22 at 1004  Med List Status: <None>   Medication Order Taking? Sig Documenting Provider Last Dose Status Informant  ACCU-CHEK GUIDE test strip [id number]  USE AS DIRECTED TO CHECK BLOOD SUGARS 2 TIMES A DAY DX: E11.22 Michelle Peng, MD  Active Self  Accu-Chek Softclix Lancets lancets [id number]  USE AS DIRECTED TO CHECK BLOOD SUGARS 2 TIMES A DAY DX: E11.22 Michelle Peng, MD  Active Self  albuterol (VENTOLIN HFA) 108 (90 Base) MCG/ACT inhaler [id number]  Inhale 2 puffs into the lungs every 6 (six) hours as needed for wheezing or shortness of breath. [name], FNP  Active Self  amLODipine (NORVASC) 10 MG tablet [id number]  Take 1 tablet (10 mg total) by mouth daily. Lewie Chamber, MD  Active   apixaban (ELIQUIS) 5 MG TABS tablet  [id number]  Take 1 tablet (5 mg total) by mouth 2 (two) times daily. Lewie Chamber, MD  Active   atorvastatin (LIPITOR) 40 MG tablet [id number] Yes Take one tablet by mouth daily on Monday through Friday. Michelle Peng, MD Taking Active Self  calcium acetate (PHOSLO) 667 MG capsule [id number]  Take 2 capsules (1,334 mg total) by mouth 3 (three) times daily with meals. Lewie Chamber, MD  Active   carvedilol (COREG) 25 MG tablet [id number] Yes Take 1 tablet (25 mg total) by mouth 2 (two) times daily. Michelle Peng, MD Taking Active Self  colchicine 0.6 MG tablet [id number]  Take 1 tablet only on Tuesday and Friday Lewie Chamber, MD  Active   hydrALAZINE (APRESOLINE) 25 MG tablet [id number]  Take 25 mg by mouth daily. [provider]  Active Self           Med Note (Cummings, Michelle So   Wed Apr 29, 2022 10:21 PM) Unable to remember to take the 2nd dose -  patient also stated that she wasn't told to stop taking it, but hasn't been filled since 10/2021.  latanoprost (XALATAN) 0.005 % ophthalmic solution [id number]  Place 1 drop into both eyes at bedtime. [provider]  Active Self  multivitamin (RENA-VIT) TABS tablet [id number]  Take 1 tablet by mouth at bedtime. Lewie Chamber, MD  Active   polyethylene glycol (MIRALAX / GLYCOLAX) 17 g packet [id number]  Take 17 g by mouth daily as needed for mild constipation or moderate constipation. Lewie Chamber, MD  Active   senna-docusate (SENOKOT-S) 8.6-50 MG tablet [id number]  Take 1 tablet by mouth 2 (two) times daily as needed for mild constipation. Cummings,  Michelle Hua, MD  Active   timolol (TIMOPTIC-XR) 0.5 % ophthalmic gel-forming [id number]  Place 1 drop into both eyes in the morning. [provider]  Active Self  vitamin C (ASCORBIC ACID) 500 MG tablet [id number]  Take 1,000 mg by mouth daily. [provider]  Active Self  Vitamin D, Ergocalciferol, (DRISDOL) 1.25 MG (50000 UNIT) CAPS capsule [id number]  TAKE 1 CAPSULE BY MOUTH EVERY 7  DAYS  Patient taking differently: Take 50,000 Units by mouth every 7 (seven) days.   Michelle Peng, MD  Active Self            Home Care and Equipment/Supplies: Were Home Health Services Ordered?: NA Any new equipment or medical supplies ordered?: NA  Functional Questionnaire: Do you need assistance with bathing/showering or dressing?: No Do you need assistance with meal preparation?: No Do you need assistance with eating?: No Do you have difficulty maintaining continence: No Do you need assistance with getting out of bed/getting out of a chair/moving?: No Do you have difficulty managing or taking your medications?: No  Follow up appointments reviewed: PCP Follow-up appointment confirmed?: Yes MD Provider Line Number:[phone] Given: No Date of PCP follow-up appointment?: 05/13/22 Follow-up Provider: Dr. Allyne Cummings Specialist [facility] Follow-up appointment confirmed?: No Reason Specialist Follow-Up Not Confirmed: Patient has Specialist Provider Number and will Call for Appointment (per d/c instructions surgeon office to call pt with appt info-aware to follow up with office if she does not hear from them in a few days) Do you need transportation to your follow-up appointment?: No (Daughter states she will take pt to MD follow up appts-but pt will need assistance getting to HD txs)  SDOH Interventions Today    Flowsheet Row Most Recent Value  SDOH Interventions   Food Insecurity Interventions Intervention Not Indicated  Transportation Interventions AMB Referral  [referral to care guide for transportation resources-pt new toHD 4x/wk and states she only has 12 rides left with insurance and will need alternative transportation options]      TOC Interventions Today    Flowsheet Row Most Recent Value  TOC Interventions   TOC Interventions Discussed/Reviewed TOC Interventions Discussed, Post discharge activity limitations per provider, Post op wound/incision care, S/S of  infection, Arranged PCP follow up within 7 days/Care Guide scheduled      Interventions Today    Flowsheet Row Most Recent Value  Chronic Disease   Chronic disease during today's visit Chronic Kidney Disease/End Stage Renal Disease (ESRD), Hypertension (HTN)  General Interventions   General Interventions Discussed/Reviewed General Interventions Discussed, Doctor Visits, Tax adviser to care guide for transportation resources]  Doctor Visits Discussed/Reviewed PCP, Doctor Visits Discussed, Specialist  PCP/Specialist Visits Compliance with follow-up visit  Education Interventions   Education Provided Provided Education  Provided Verbal Education On Nutrition, When to see the doctor, Medication, Blood Sugar Monitoring  Nutrition Interventions   Nutrition Discussed/Reviewed Nutrition Discussed, Adding fruits and vegetables, Decreasing salt  [renal diet]  Pharmacy Interventions   Pharmacy Dicussed/Reviewed Pharmacy Topics Discussed, Medications and their functions  Safety Interventions   Safety Discussed/Reviewed Safety Discussed       Alessandra Grout [facility] Care Management Care Management Community Coordinator Direct Phone: [phone] Toll Free: [phone] Fax: [phone]

## 2022-05-07 ENCOUNTER — Inpatient Hospital Stay (HOSPITAL_COMMUNITY): Payer: Medicare PPO

## 2022-05-07 ENCOUNTER — Other Ambulatory Visit: Payer: Self-pay

## 2022-05-07 ENCOUNTER — Inpatient Hospital Stay (HOSPITAL_COMMUNITY)
Admission: EM | Admit: 2022-05-07 | Discharge: 2022-06-06 | DRG: 871 | Disposition: E | Payer: Medicare PPO | Source: Ambulatory Visit | Attending: Pulmonary Disease | Admitting: Pulmonary Disease

## 2022-05-07 ENCOUNTER — Telehealth: Payer: Self-pay

## 2022-05-07 ENCOUNTER — Emergency Department (HOSPITAL_COMMUNITY): Payer: Medicare PPO

## 2022-05-07 ENCOUNTER — Other Ambulatory Visit: Payer: Medicare PPO

## 2022-05-07 DIAGNOSIS — Z992 Dependence on renal dialysis: Secondary | ICD-10-CM | POA: Diagnosis not present

## 2022-05-07 DIAGNOSIS — J189 Pneumonia, unspecified organism: Secondary | ICD-10-CM | POA: Diagnosis present

## 2022-05-07 DIAGNOSIS — Z7901 Long term (current) use of anticoagulants: Secondary | ICD-10-CM

## 2022-05-07 DIAGNOSIS — N186 End stage renal disease: Secondary | ICD-10-CM | POA: Diagnosis present

## 2022-05-07 DIAGNOSIS — Z66 Do not resuscitate: Secondary | ICD-10-CM | POA: Diagnosis not present

## 2022-05-07 DIAGNOSIS — R569 Unspecified convulsions: Secondary | ICD-10-CM | POA: Diagnosis present

## 2022-05-07 DIAGNOSIS — H409 Unspecified glaucoma: Secondary | ICD-10-CM | POA: Diagnosis present

## 2022-05-07 DIAGNOSIS — I3139 Other pericardial effusion (noninflammatory): Secondary | ICD-10-CM

## 2022-05-07 DIAGNOSIS — I120 Hypertensive chronic kidney disease with stage 5 chronic kidney disease or end stage renal disease: Secondary | ICD-10-CM | POA: Diagnosis present

## 2022-05-07 DIAGNOSIS — N2581 Secondary hyperparathyroidism of renal origin: Secondary | ICD-10-CM | POA: Diagnosis not present

## 2022-05-07 DIAGNOSIS — J80 Acute respiratory distress syndrome: Secondary | ICD-10-CM | POA: Diagnosis present

## 2022-05-07 DIAGNOSIS — Z79899 Other long term (current) drug therapy: Secondary | ICD-10-CM | POA: Diagnosis not present

## 2022-05-07 DIAGNOSIS — I082 Rheumatic disorders of both aortic and tricuspid valves: Secondary | ICD-10-CM | POA: Diagnosis not present

## 2022-05-07 DIAGNOSIS — E872 Acidosis, unspecified: Secondary | ICD-10-CM | POA: Diagnosis present

## 2022-05-07 DIAGNOSIS — I309 Acute pericarditis, unspecified: Secondary | ICD-10-CM | POA: Diagnosis not present

## 2022-05-07 DIAGNOSIS — E785 Hyperlipidemia, unspecified: Secondary | ICD-10-CM | POA: Diagnosis present

## 2022-05-07 DIAGNOSIS — D631 Anemia in chronic kidney disease: Secondary | ICD-10-CM | POA: Diagnosis present

## 2022-05-07 DIAGNOSIS — E890 Postprocedural hypothyroidism: Secondary | ICD-10-CM | POA: Diagnosis present

## 2022-05-07 DIAGNOSIS — I469 Cardiac arrest, cause unspecified: Secondary | ICD-10-CM | POA: Diagnosis not present

## 2022-05-07 DIAGNOSIS — J90 Pleural effusion, not elsewhere classified: Secondary | ICD-10-CM | POA: Diagnosis present

## 2022-05-07 DIAGNOSIS — A419 Sepsis, unspecified organism: Principal | ICD-10-CM | POA: Diagnosis present

## 2022-05-07 DIAGNOSIS — I314 Cardiac tamponade: Secondary | ICD-10-CM | POA: Diagnosis not present

## 2022-05-07 DIAGNOSIS — I7122 Aneurysm of the aortic arch, without rupture: Secondary | ICD-10-CM | POA: Diagnosis present

## 2022-05-07 DIAGNOSIS — R001 Bradycardia, unspecified: Secondary | ICD-10-CM | POA: Diagnosis not present

## 2022-05-07 DIAGNOSIS — Z515 Encounter for palliative care: Secondary | ICD-10-CM | POA: Diagnosis not present

## 2022-05-07 DIAGNOSIS — R579 Shock, unspecified: Secondary | ICD-10-CM | POA: Diagnosis present

## 2022-05-07 DIAGNOSIS — I959 Hypotension, unspecified: Secondary | ICD-10-CM | POA: Diagnosis not present

## 2022-05-07 DIAGNOSIS — Z888 Allergy status to other drugs, medicaments and biological substances status: Secondary | ICD-10-CM

## 2022-05-07 DIAGNOSIS — R531 Weakness: Secondary | ICD-10-CM | POA: Diagnosis present

## 2022-05-07 DIAGNOSIS — Z803 Family history of malignant neoplasm of breast: Secondary | ICD-10-CM

## 2022-05-07 DIAGNOSIS — I480 Paroxysmal atrial fibrillation: Secondary | ICD-10-CM | POA: Diagnosis present

## 2022-05-07 DIAGNOSIS — I499 Cardiac arrhythmia, unspecified: Secondary | ICD-10-CM | POA: Diagnosis not present

## 2022-05-07 DIAGNOSIS — R079 Chest pain, unspecified: Secondary | ICD-10-CM | POA: Diagnosis not present

## 2022-05-07 DIAGNOSIS — Z91013 Allergy to seafood: Secondary | ICD-10-CM

## 2022-05-07 DIAGNOSIS — Z8249 Family history of ischemic heart disease and other diseases of the circulatory system: Secondary | ICD-10-CM

## 2022-05-07 DIAGNOSIS — E1122 Type 2 diabetes mellitus with diabetic chronic kidney disease: Secondary | ICD-10-CM | POA: Diagnosis present

## 2022-05-07 DIAGNOSIS — F32A Depression, unspecified: Secondary | ICD-10-CM | POA: Diagnosis not present

## 2022-05-07 DIAGNOSIS — R9431 Abnormal electrocardiogram [ECG] [EKG]: Secondary | ICD-10-CM | POA: Diagnosis not present

## 2022-05-07 LAB — I-STAT ARTERIAL BLOOD GAS, ED
Acid-base deficit: 2 mmol/L (ref 0.0–2.0)
Bicarbonate: 25.9 mmol/L (ref 20.0–28.0)
Calcium, Ion: 0.91 mmol/L — ABNORMAL LOW (ref 1.15–1.40)
HCT: 19 % — ABNORMAL LOW (ref 36.0–46.0)
Hemoglobin: 6.5 g/dL — CL (ref 12.0–15.0)
O2 Saturation: 72 %
Patient temperature: 93.8
Potassium: 4 mmol/L (ref 3.5–5.1)
Sodium: 137 mmol/L (ref 135–145)
TCO2: 28 mmol/L (ref 22–32)
pCO2 arterial: 55.3 mmHg — ABNORMAL HIGH (ref 32–48)
pH, Arterial: 7.264 — ABNORMAL LOW (ref 7.35–7.45)
pO2, Arterial: 38 mmHg — CL (ref 83–108)

## 2022-05-07 LAB — I-STAT VENOUS BLOOD GAS, ED
Acid-base deficit: 10 mmol/L — ABNORMAL HIGH (ref 0.0–2.0)
Bicarbonate: 18 mmol/L — ABNORMAL LOW (ref 20.0–28.0)
Calcium, Ion: 0.57 mmol/L — CL (ref 1.15–1.40)
HCT: 27 % — ABNORMAL LOW (ref 36.0–46.0)
Hemoglobin: 9.2 g/dL — ABNORMAL LOW (ref 12.0–15.0)
O2 Saturation: 96 %
Potassium: 3.4 mmol/L — ABNORMAL LOW (ref 3.5–5.1)
Sodium: 138 mmol/L (ref 135–145)
TCO2: 19 mmol/L — ABNORMAL LOW (ref 22–32)
pCO2, Ven: 48.6 mmHg (ref 44–60)
pH, Ven: 7.177 — CL (ref 7.25–7.43)
pO2, Ven: 102 mmHg — ABNORMAL HIGH (ref 32–45)

## 2022-05-07 LAB — BASIC METABOLIC PANEL
Anion gap: 24 — ABNORMAL HIGH (ref 5–15)
BUN: 38 mg/dL — ABNORMAL HIGH (ref 8–23)
CO2: 17 mmol/L — ABNORMAL LOW (ref 22–32)
Calcium: 7.8 mg/dL — ABNORMAL LOW (ref 8.9–10.3)
Chloride: 97 mmol/L — ABNORMAL LOW (ref 98–111)
Creatinine, Ser: 4.55 mg/dL — ABNORMAL HIGH (ref 0.44–1.00)
GFR, Estimated: 10 mL/min — ABNORMAL LOW (ref >60)
Glucose, Bld: 104 mg/dL — ABNORMAL HIGH (ref 70–99)
Potassium: 3.8 mmol/L (ref 3.5–5.1)
Sodium: 138 mmol/L (ref 135–145)

## 2022-05-07 LAB — TROPONIN I (HIGH SENSITIVITY)
Troponin I (High Sensitivity): 15 ng/L (ref <18)
Troponin I (High Sensitivity): 41 ng/L — ABNORMAL HIGH (ref <18)

## 2022-05-07 LAB — CBC
HCT: 29.4 % — ABNORMAL LOW (ref 36.0–46.0)
Hemoglobin: 8.2 g/dL — ABNORMAL LOW (ref 12.0–15.0)
MCH: 26.5 pg (ref 26.0–34.0)
MCHC: 27.9 g/dL — ABNORMAL LOW (ref 30.0–36.0)
MCV: 95.1 fL (ref 80.0–100.0)
Platelets: 161 10*3/uL (ref 150–400)
RBC: 3.09 MIL/uL — ABNORMAL LOW (ref 3.87–5.11)
RDW: 17.6 % — ABNORMAL HIGH (ref 11.5–15.5)
WBC: 17.1 10*3/uL — ABNORMAL HIGH (ref 4.0–10.5)
nRBC: 1.1 % — ABNORMAL HIGH (ref 0.0–0.2)

## 2022-05-07 LAB — HEPATIC FUNCTION PANEL
ALT: 715 U/L — ABNORMAL HIGH (ref 0–44)
AST: 1143 U/L — ABNORMAL HIGH (ref 15–41)
Albumin: 1.9 g/dL — ABNORMAL LOW (ref 3.5–5.0)
Alkaline Phosphatase: 93 U/L (ref 38–126)
Bilirubin, Direct: 0.3 mg/dL — ABNORMAL HIGH (ref 0.0–0.2)
Indirect Bilirubin: 0.4 mg/dL (ref 0.3–0.9)
Total Bilirubin: 0.7 mg/dL (ref 0.3–1.2)
Total Protein: 4.6 g/dL — ABNORMAL LOW (ref 6.5–8.1)

## 2022-05-07 LAB — MAGNESIUM: Magnesium: 1.9 mg/dL (ref 1.7–2.4)

## 2022-05-07 LAB — CORTISOL: Cortisol, Plasma: 69.8 ug/dL

## 2022-05-07 LAB — LIPASE, BLOOD: Lipase: 40 U/L (ref 11–51)

## 2022-05-07 LAB — BRAIN NATRIURETIC PEPTIDE: B Natriuretic Peptide: 660.7 pg/mL — ABNORMAL HIGH (ref 0.0–100.0)

## 2022-05-07 LAB — ECHOCARDIOGRAM LIMITED
Est EF: >75
S' Lateral: 1.7 cm

## 2022-05-07 LAB — LACTIC ACID, PLASMA
Lactic Acid, Venous: 6.3 mmol/L (ref 0.5–1.9)
Lactic Acid, Venous: 6 mmol/L (ref 0.5–1.9)

## 2022-05-07 MED ORDER — FENTANYL CITRATE PF 50 MCG/ML IJ SOSY: 25.0000 ug | PREFILLED_SYRINGE | Freq: Once | INTRAMUSCULAR | Status: DC

## 2022-05-07 MED ORDER — EPINEPHRINE 1 MG/10ML IJ SOSY
PREFILLED_SYRINGE | INTRAMUSCULAR | Status: AC | PRN
  Administered 2022-05-07 (×6): 1 mg via INTRAVENOUS

## 2022-05-07 MED ORDER — EPINEPHRINE HCL 5 MG/250ML IV SOLN IN NS
0.5000 ug/min | INTRAVENOUS | Status: DC
  Administered 2022-05-07: 10 ug/min via INTRAVENOUS

## 2022-05-07 MED ORDER — INSULIN ASPART 100 UNIT/ML IJ SOLN: 0.0000 [IU] | INTRAMUSCULAR | Status: DC

## 2022-05-07 MED ORDER — MIDAZOLAM HCL 2 MG/2ML IJ SOLN
2.0000 mg | Freq: Once | INTRAMUSCULAR | Status: AC
  Administered 2022-05-07: 2 mg via INTRAVENOUS
  Filled 2022-05-07: qty 2

## 2022-05-07 MED ORDER — DEXTROSE 50 % IV SOLN
INTRAVENOUS | Status: AC | PRN
  Administered 2022-05-07: 25 g via INTRAVENOUS

## 2022-05-07 MED ORDER — NALOXONE HCL 2 MG/2ML IJ SOSY
PREFILLED_SYRINGE | INTRAMUSCULAR | Status: AC | PRN
  Administered 2022-05-07: 2 mg via INTRAVENOUS

## 2022-05-07 MED ORDER — VANCOMYCIN HCL 1250 MG/250ML IV SOLN
1250.0000 mg | Freq: Once | INTRAVENOUS | Status: AC
  Administered 2022-05-07: 1250 mg via INTRAVENOUS
  Filled 2022-05-07: qty 250

## 2022-05-07 MED ORDER — SODIUM CHLORIDE 0.9 % IV SOLN
250.0000 mL | INTRAVENOUS | Status: DC
  Administered 2022-05-07: 250 mL via INTRAVENOUS

## 2022-05-07 MED ORDER — PROPOFOL 1000 MG/100ML IV EMUL
0.0000 ug/kg/min | INTRAVENOUS | Status: DC
  Administered 2022-05-07: 10 ug/kg/min via INTRAVENOUS

## 2022-05-07 MED ORDER — SODIUM CHLORIDE 0.9 % IV SOLN: INTRAVENOUS | Status: DC | PRN

## 2022-05-07 MED ORDER — PANTOPRAZOLE SODIUM 40 MG IV SOLR: 40.0000 mg | Freq: Every day | INTRAVENOUS | Status: DC

## 2022-05-07 MED ORDER — NOREPINEPHRINE 4 MG/250ML-% IV SOLN
0.0000 ug/min | INTRAVENOUS | Status: DC
  Administered 2022-05-07: 60 ug/min via INTRAVENOUS
  Administered 2022-05-07 (×2): 40 ug/min via INTRAVENOUS
  Filled 2022-05-07 (×2): qty 250

## 2022-05-07 MED ORDER — MIDAZOLAM HCL 2 MG/2ML IJ SOLN
2.0000 mg | Freq: Once | INTRAMUSCULAR | Status: AC
  Administered 2022-05-07: 4 mg via INTRAVENOUS
  Filled 2022-05-07: qty 4

## 2022-05-07 MED ORDER — VASOPRESSIN 20 UNITS/100 ML INFUSION FOR SHOCK
0.0300 [IU]/min | INTRAVENOUS | Status: DC
  Administered 2022-05-07: 0.03 [IU]/min via INTRAVENOUS
  Filled 2022-05-07 (×2): qty 100

## 2022-05-07 MED ORDER — EPINEPHRINE 1 MG/10ML IJ SOSY
PREFILLED_SYRINGE | INTRAMUSCULAR | Status: AC | PRN
  Administered 2022-05-07 (×2): 1 mg via INTRAVENOUS

## 2022-05-07 MED ORDER — PIPERACILLIN-TAZOBACTAM 3.375 G IVPB 30 MIN
3.3750 g | Freq: Once | INTRAVENOUS | Status: AC
  Administered 2022-05-07: 3.375 g via INTRAVENOUS
  Filled 2022-05-07: qty 50

## 2022-05-07 MED ORDER — SODIUM BICARBONATE 8.4 % IV SOLN
INTRAVENOUS | Status: AC | PRN
  Administered 2022-05-07 (×2): 50 meq via INTRAVENOUS

## 2022-05-07 MED ORDER — SODIUM CHLORIDE 0.9 % IV BOLUS (SEPSIS)
1000.0000 mL | Freq: Once | INTRAVENOUS | Status: AC
  Administered 2022-05-07: 1000 mL via INTRAVENOUS

## 2022-05-07 MED ORDER — POLYETHYLENE GLYCOL 3350 17 G PO PACK: 17.0000 g | PACK | Freq: Every day | ORAL | Status: DC | PRN

## 2022-05-07 MED ORDER — EPINEPHRINE 1 MG/10ML IJ SOSY
PREFILLED_SYRINGE | INTRAMUSCULAR | Status: AC | PRN
  Administered 2022-05-07 (×3): 1 mg via INTRAVENOUS

## 2022-05-07 MED ORDER — FENTANYL BOLUS VIA INFUSION: 25.0000 ug | INTRAVENOUS | Status: DC | PRN

## 2022-05-07 MED ORDER — DOCUSATE SODIUM 50 MG/5ML PO LIQD: 100.0000 mg | Freq: Two times a day (BID) | ORAL | Status: DC | PRN

## 2022-05-07 MED ORDER — SODIUM CHLORIDE 0.9 % IV BOLUS
500.0000 mL | Freq: Once | INTRAVENOUS | Status: AC
  Administered 2022-05-07: 500 mL via INTRAVENOUS

## 2022-05-07 MED ORDER — EPINEPHRINE 1 MG/10ML IJ SOSY
PREFILLED_SYRINGE | INTRAMUSCULAR | Status: AC | PRN
  Administered 2022-05-07: .5 mg via INTRAVENOUS

## 2022-05-07 MED ORDER — FENTANYL 2500MCG IN NS 250ML (10MCG/ML) PREMIX INFUSION
25.0000 ug/h | INTRAVENOUS | Status: DC
  Administered 2022-05-07: 25 ug/h via INTRAVENOUS
  Filled 2022-05-07: qty 250

## 2022-05-07 MED ORDER — CALCIUM CHLORIDE 10 % IV SOLN
INTRAVENOUS | Status: AC | PRN
  Administered 2022-05-07: 1 g via INTRAVENOUS

## 2022-05-08 LAB — BLOOD CULTURE ID PANEL (REFLEXED) - BCID2

## 2022-05-08 LAB — CULTURE, BLOOD (ROUTINE X 2)

## 2022-05-08 LAB — PROCALCITONIN: Procalcitonin: 1.36 ng/mL

## 2022-05-08 NOTE — Progress Notes (Signed)
Subsequent to initial evaluation, central line placed uneventfully.   Shortly after, patient developed bradycardia,  then asystole Patient had been hypoxemic and hypotensive persistently (map in 60s-70s even on high does of levophed ( ) prior to that).    Short cardiac arrest, I believe 3 doses epi given and bicarbonate.  ROSC achieved, epi drip started.  Copious pink tinged frothy secretions from Et tube.   Trop 15, NBP pending.  Echo - sm to mod effusion, no tamponade, functional LV and RV. No RV dilation. Persistent hypoxemia despite max vent settings.   Suspect ARDS 2/2 sepsis.  Possible some component of chf/card shock though not evident on echo and mixed venous o2 sat in the 70s following arrest.     Third arrest occurred, short, required epi x 2 and bicarbonate push.    Patient remained hypoxemic.  To unstable hemodynamically for proning.   Remained markedly hypotensive as well despite large doses pressors (levo 40 epi 40).   CXR: B edema, no distinct changes otherwise.  Discussed situation with daughter.  Explained refractory illness and that she was unlikely to survive.  DNR.    Mild improvement in sat only with bagging, very difficult to bag with very poor compliance.   Not oxygenating or ventilating.  Duriesis or dialysis not an option due to BP/shock.    Goal to focus on comfort with brief time likely remaining.  Placed back on vent (was being bagged).  Died at 06/24/2157 with daughter and friend at bedside.

## 2022-05-08 NOTE — Procedures (Signed)
Arterial Catheter Insertion Procedure Note  Michelle Cummings  [id number]  [birth year]  Date:06/03/22  Time:5:20 AM    Provider Performing: Charlotte Sanes    Procedure: Insertion of Arterial Line ([id number]) without US guidance  Indication(s) Blood pressure monitoring and/or need for frequent ABGs  Consent Unable to obtain consent due to emergent nature of procedure.  Anesthesia None   Time Out Verified patient identification, verified procedure, site/side was marked, verified correct patient position, special equipment/implants available, medications/allergies/relevant history reviewed, required imaging and test results available.   Sterile Technique Maximal sterile technique including full sterile barrier drape, hand hygiene, sterile gown, sterile gloves, mask, hair covering, sterile ultrasound probe cover (if used).   Procedure Description Area of catheter insertion was cleaned with chlorhexidine and draped in sterile fashion. Without real-time ultrasound guidance an arterial catheter was placed into the right femoral artery.  Appropriate arterial tracings confirmed on monitor.     Complications/Tolerance None; patient tolerated the procedure well.   EBL Minimal   Specimen(s) None

## 2022-05-09 LAB — CULTURE, BLOOD (ROUTINE X 2): Culture: NO GROWTH

## 2022-05-10 LAB — CULTURE, BLOOD (ROUTINE X 2)

## 2022-05-11 LAB — CULTURE, BLOOD (ROUTINE X 2): Special Requests: ADEQUATE

## 2022-05-13 ENCOUNTER — Inpatient Hospital Stay: Payer: Medicare PPO | Admitting: Internal Medicine

## 2022-05-18 ENCOUNTER — Encounter (HOSPITAL_COMMUNITY): Payer: Medicare PPO

## 2022-05-19 ENCOUNTER — Ambulatory Visit: Payer: Medicare PPO | Admitting: Internal Medicine

## 2022-05-25 ENCOUNTER — Other Ambulatory Visit: Payer: Medicare PPO

## 2022-06-06 NOTE — ED Notes (Signed)
Family at bedside. 

## 2022-06-06 NOTE — Progress Notes (Addendum)
Pt ET tube advanced from 22 measured at the lips to 24 measured at the lips per ED MD. Pt tolerated well, MD aware, RN at beside, vitals stable, RT will monitor as needed.     05/12/2022 1815  Airway 7.5 mm  Placement Date/Time: 05/30/2022 1801   Grade View: Grade 2  Placed By: Self  Airway Device: Endotracheal Tube  Laryngoscope Blade: MAC;3  ETT Types: Oral  Size (mm): 7.5 mm  Cuffed: Cuffed  Insertion attempts: 1  Airway Equipment: Stylet;Video Laryngosc...  Secured at (cm) (S)  24 cm (advanced per ED MD)  Measured From Lips  Secured Location Center  Secured By Commercial Tube Holder  Prone position No  Cuff Pressure (cm H2O) Clear OR 27-39 CmH2O  Site Condition Dry

## 2022-06-06 NOTE — H&P (Signed)
NAME:  HAREEM SUROWIEC, MRN:  [id number], DOB:  [birth year], LOS: 0 ADMISSION DATE:  06/01/2022, CONSULTATION DATE:  05/17/2022  REFERRING MD:  Adela Lank  ., CHIEF COMPLAINT:  Weakness   History of Present Illness:   Ms. Estill is a 72 yo woman with hx of  PMH CKD 5, recently started on HD, anemia of chronic disease, DM II, glaucoma, HLD, HTN presented with weakness worsening this AM.   Per her daughter, she has also been somewhat short of breath, and was clutching her abdomen during HD.   On arrival to the ED, she suffered cardiac arrest.  She was initially thought to be seizing, then noted pulseless and asystolic.  Some mention that she had been hypotensive throughout the day Cardiac arrest x 20 min.   On levophed 53mcg/min following arrest.  Line placed in L IJ Neck by Vernona Rieger Gleason.  Pertinent  Medical History  CKD 5 on HD (started on recent admission, d/c on 4/30, admitted with uremia and uremic pericardial effusion).  Anemia DmII Glaucoma, HTN Uremic pericardial effusion without tamponade. Aortic aneurysm  Recent dx afib/flutter vs SVT.   HLD  Significant Hospital Events: Including procedures, antibiotic start and stop dates in addition to other pertinent events     Interim History / Subjective:    Objective   Blood pressure 98/65, pulse (!) 38, temperature (!) 94.4 F (34.7 C), resp. rate (!) 22, SpO2 90 %.    Vent Mode: PRVC FiO2 (%):  [100 %] 100 % Set Rate:  [22 bmp] 22 bmp Vt Set:  [430 mL] 430 mL PEEP:  [10 cmH20] 10 cmH20 Plateau Pressure:  [22 cmH20] 22 cmH20   Intake/Output Summary (Last 24 hours) at 05/14/2022 2031 Last data filed at 06/04/2022 1914 Gross per 24 hour  Intake 236.61 ml  Output 0 ml  Net 236.61 ml   There were no vitals filed for this visit.  Examination: General: Intubated, following simple commands  HENT: ncat, intubated.  Some clear secretions from ET tube.   Lungs: B Rhonchi  Cardiovascular: RRR no mgr  Abdomen: slightly distended, nbs,   Extremities: No edema  Neuro: following commands, perrl GU: foley in   Echo: Echo this evening: not changed from echo 4/29 - atient s/p cardiac arrest with known history of moderate pericardial  effusion. Currently on vasopressors.   2. Left ventricular ejection fraction, by estimation, is >75%. The left  ventricle has hyperdynamic function. The left ventricle has no regional  wall motion abnormalities. There is moderate left ventricular hypertrophy.  Left ventricular diastolic  function could not be evaluated.   3. Right ventricular systolic function is low normal. The right  ventricular size is normal. Mildly increased right ventricular wall  thickness. There is mildly elevated pulmonary artery systolic pressure.   4. Left atrial size was grossly normal in size.   5. Right atrial size was grossly normal in size.   6. Moderate pericardial effusion. The pericardial effusion is  circumferential. There is no evidence of cardiac tamponade.   Resolved Hospital Problem list     Assessment & Plan:  Shock Leukocytosis Hypothermia VBG: Sat 72%.  Bedside POC [place]: Small effusion, Normal RV function, LV function.  Cardiology already consulted.  Do not think effusion is cause of shock.  Likely sepsis 2/2 PNA.  Check VBP (sat )  ABG - low PAO2.    Lactic acidosis - likely 2/2 shock vs possible bowel ischemia.  Afib - ongoing, rate controlled.   ESRD, recently  started HD.  Uremia improved. Uremic pleural effusion, does not appear to be causing shock.    DMII  HTN HLD     Best Practice (right click and "Reselect all SmartList Selections" daily)   Diet/type: NPO DVT prophylaxis: other GI prophylaxis: PPI Lines: Central line Foley:  Yes, and it is still needed Code Status:  full code Last date of multidisciplinary goals of care discussion []   Labs   CBC: Recent Labs  Lab 05/01/22 0130 05/02/22 0111 05/03/22 0108 05/04/22 0123 05/05/22 0109 05/29/2022 1834 05/14/2022 1849   WBC 7.4 7.0 9.1 10.2 10.8*  --  17.1*  NEUTROABS 5.8 5.3 7.0 8.0* 8.7*  --   --   HGB 8.5* 8.3* 8.5* 8.2* 8.3* 9.2* 8.2*  HCT 25.3* 25.8* 26.8* 25.6* 26.7* 27.0* 29.4*  MCV 79.3* 82.7 84.0 85.3 86.4  --  95.1  PLT 299 286 221 239 265  --  161    Basic Metabolic Panel: Recent Labs  Lab 05/01/22 0130 05/02/22 0111 05/03/22 0108 05/04/22 0123 05/05/22 0109 05/19/2022 1834 05/30/2022 1849  NA 139 137 135 131* 133* 138 138  K 3.4* 4.1 4.4 4.2 4.3 3.4* 3.8  CL 99 100 95* 93* 94*  --  97*  CO2 16* 19* 23 24 27   --  17*  GLUCOSE 82 100* 150* 127* 142*  --  104*  BUN 127* 142* 79* 92* 52*  --  38*  CREATININE 11.33* 13.04* 8.21* 9.17* 6.16*  --  4.55*  CALCIUM 7.4* 7.1* 7.3* 6.9* 7.5*  --  7.8*  MG 2.1 2.3 2.0 2.1 1.9  --   --   PHOS  --  11.8* 7.0* 8.7* 6.7*  --   --    GFR: Estimated Creatinine Clearance: 10.3 mL/min (A) (by C-G formula based on SCr of 4.55 mg/dL (H)). Recent Labs  Lab 05/03/22 0108 05/04/22 0123 05/05/22 0109 06/03/2022 1752 06/02/2022 1849  WBC 9.1 10.2 10.8*  --  17.1*  LATICACIDVEN  --   --   --  6.3*  --     Liver Function Tests: Recent Labs  Lab 05/02/22 0111 05/03/22 0108 05/04/22 0123 05/05/22 0109 06/03/2022 1849  AST  --   --   --   --  1,143*  ALT  --   --   --   --  715*  ALKPHOS  --   --   --   --  93  BILITOT  --   --   --   --  0.7  PROT  --   --   --   --  4.6*  ALBUMIN 2.3* 2.2* 2.1* 2.0* 1.9*   No results for input(s): "LIPASE", "AMYLASE" in the last 168 hours. No results for input(s): "AMMONIA" in the last 168 hours.  ABG    Component Value Date/Time   HCO3 18.0 (L) 06/05/2022 1834   TCO2 19 (L) 05/22/2022 1834   ACIDBASEDEF 10.0 (H) 06/04/2022 1834   O2SAT 96 05/13/2022 1834     Coagulation Profile: No results for input(s): "INR", "PROTIME" in the last 168 hours.  Cardiac Enzymes: No results for input(s): "CKTOTAL", "CKMB", "CKMBINDEX", "TROPONINI" in the last 168 hours.  HbA1C: Hemoglobin A1C  Date/Time Value Ref  Range Status  01/14/2022 12:00 AM 8.4  Final   Hgb A1c MFr Bld  Date/Time Value Ref Range Status  02/10/2022 11:23 AM 6.0 (H) 4.8 - 5.6 % Final    Comment:  Prediabetes: 5.7 - 6.4          Diabetes: >6.4          Glycemic control for adults with diabetes: <7.0   11/04/2021 10:01 AM 6.0 (H) 4.8 - 5.6 % Final    Comment:             Prediabetes: 5.7 - 6.4          Diabetes: >6.4          Glycemic control for adults with diabetes: <7.0     CBG: Recent Labs  Lab 05/04/22 2015 05/04/22 2329 05/05/22 0301 05/05/22 0818 05/05/22 1140  GLUCAP 145* 142* 142* 170* 204*    Review of Systems:   Unable to assess  Past Medical History:  She,  has a past medical history of Anemia, Arthritis, Cataracts, bilateral, Cervical disc disease, Chronic kidney disease, Diabetes mellitus (HCC), Dyspnea, Eczema, Glaucoma, Heart murmur, History of chicken pox, Hyperlipidemia, Hypertension, Seasonal allergies, and Thyroid nodule.   Surgical History:   Past Surgical History:  Procedure Laterality Date   AV FISTULA PLACEMENT Left 04/30/2022   Procedure: LEFT ARM CEPHALIC VEIN ARTERIOVENOUS (AV) FISTULA CREATION TDC;  Surgeon: Maeola Harman, MD;  Location: [facility] OR;  Service: Vascular;  Laterality: Left;   CESAREAN SECTION  01/06/1976   COLONOSCOPY     INSERTION OF DIALYSIS CATHETER Right 04/30/2022   Procedure: INSERTION OF PALINDROME 19CM DIALYSIS CATHETER;  Surgeon: Maeola Harman, MD;  Location: [facility] OR;  Service: Vascular;  Laterality: Right;   ORIF ANKLE FRACTURE  11/05/2011   Procedure: Left OPEN REDUCTION INTERNAL FIXATION (ORIF) ANKLE FRACTURE;  Surgeon: Toni Arthurs, MD;  Location: [facility];  Service: Orthopedics;  Laterality: Left;  OPEN REDUCTION INTERNAL FIXATION LEFT LATERAL MALLEOLUS FRACTURE WITH STRESS XRAYS WITH FLOUROSCOPY   ORIF ELBOW FRACTURE  03/05/2011   Procedure: Left OPEN REDUCTION INTERNAL FIXATION (ORIF) ELBOW/OLECRANON FRACTURE;   Surgeon: Sharma Covert, MD;  Location: [facility] OR;  Service: Orthopedics;  Laterality: Left;   THYROIDECTOMY Right 07/20/2012   Procedure: RIGHT THYROID LOBECTOMY WITH ISTHMUSECTOMY;  Surgeon: Serena Colonel, MD;  Location: [facility] OR;  Service: ENT;  Laterality: Right;   TONSILLECTOMY     TUBAL LIGATION     UPPER GI ENDOSCOPY       Social History:   reports that she has never smoked. She has never used smokeless tobacco. She reports that she does not drink alcohol and does not use drugs.   Family History:  Her family history includes Breast cancer in her sister; Early death in her father; Healthy in her brother; Hypertension in her mother.   Allergies Allergies  Allergen Reactions   Atorvastatin Other (See Comments)    Myalgias- pt stopped taking    Shellfish Allergy Itching     Home Medications  Prior to Admission medications   Medication Sig Start Date End Date Taking? Authorizing Provider  ACCU-CHEK GUIDE test strip USE AS DIRECTED TO CHECK BLOOD SUGARS 2 TIMES A DAY DX: E11.22 02/01/20   Dorothyann Peng, MD  Accu-Chek Softclix Lancets lancets USE AS DIRECTED TO CHECK BLOOD SUGARS 2 TIMES A DAY DX: E11.22 02/01/20   Dorothyann Peng, MD  albuterol (VENTOLIN HFA) 108 (90 Base) MCG/ACT inhaler Inhale 2 puffs into the lungs every 6 (six) hours as needed for wheezing or shortness of breath. 04/21/22   [name], FNP  amLODipine (NORVASC) 10 MG tablet Take 1 tablet (10 mg total) by mouth daily. 05/06/22   Lewie Chamber,  MD  apixaban (ELIQUIS) 5 MG TABS tablet Take 1 tablet (5 mg total) by mouth 2 (two) times daily. 05/05/22   Lewie Chamber, MD  atorvastatin (LIPITOR) 40 MG tablet Take one tablet by mouth daily on Monday through Friday. 02/10/22   Dorothyann Peng, MD  calcium acetate (PHOSLO) 667 MG capsule Take 2 capsules (1,334 mg total) by mouth 3 (three) times daily with meals. 05/05/22   Lewie Chamber, MD  carvedilol (COREG) 25 MG tablet Take 1 tablet (25 mg total) by mouth 2 (two) times daily.  03/03/22   Dorothyann Peng, MD  colchicine 0.6 MG tablet Take 1 tablet only on Tuesday and Friday 05/05/22   Lewie Chamber, MD  hydrALAZINE (APRESOLINE) 25 MG tablet Take 25 mg by mouth daily. 11/04/21   [provider]  latanoprost (XALATAN) 0.005 % ophthalmic solution Place 1 drop into both eyes at bedtime. 09/01/16   [provider]  multivitamin (RENA-VIT) TABS tablet Take 1 tablet by mouth at bedtime. 05/05/22   Lewie Chamber, MD  polyethylene glycol (MIRALAX / GLYCOLAX) 17 g packet Take 17 g by mouth daily as needed for mild constipation or moderate constipation. 05/05/22   Lewie Chamber, MD  senna-docusate (SENOKOT-S) 8.6-50 MG tablet Take 1 tablet by mouth 2 (two) times daily as needed for mild constipation. 05/05/22   Lewie Chamber, MD  timolol (TIMOPTIC-XR) 0.5 % ophthalmic gel-forming Place 1 drop into both eyes in the morning.    [provider]  vitamin C (ASCORBIC ACID) 500 MG tablet Take 1,000 mg by mouth daily.    [provider]  Vitamin D, Ergocalciferol, (DRISDOL) 1.25 MG (50000 UNIT) CAPS capsule TAKE 1 CAPSULE BY MOUTH EVERY 7 DAYS Patient taking differently: Take 50,000 Units by mouth every 7 (seven) days. 03/19/22   Dorothyann Peng, MD     Critical care time: 45 min

## 2022-06-06 NOTE — ED Notes (Signed)
Respiratory called

## 2022-06-06 NOTE — Telephone Encounter (Signed)
Opened in error

## 2022-06-06 NOTE — ED Notes (Signed)
Paged Patwardhan to [employer]

## 2022-06-06 NOTE — ED Provider Notes (Addendum)
[place] EMERGENCY DEPARTMENT AT [facility] Provider Note   CSN: [id number] Arrival date & time: 05/24/2022  1720     History  Chief Complaint  Patient presents with   Weakness    CHAD TIZNADO is a 72 y.o. female.  72 yo F with a chief complaints of fatigue.  This was reported by EMS.  Patient had not felt well for the past couple days.  Was recently in the hospital and was initiated on dialysis.  Had a dialysis session today and had completed the session.  Due to her hypotension she did not have any fluid withdrawn.  Blood pressure on arrival in the 70s.  Patient had arrested before my initial exam.   Weakness      Home Medications Prior to Admission medications   Medication Sig Start Date End Date Taking? Authorizing Provider  ACCU-CHEK GUIDE test strip USE AS DIRECTED TO CHECK BLOOD SUGARS 2 TIMES A DAY DX: E11.22 02/01/20   Dorothyann Peng, MD  Accu-Chek Softclix Lancets lancets USE AS DIRECTED TO CHECK BLOOD SUGARS 2 TIMES A DAY DX: E11.22 02/01/20   Dorothyann Peng, MD  albuterol (VENTOLIN HFA) 108 (90 Base) MCG/ACT inhaler Inhale 2 puffs into the lungs every 6 (six) hours as needed for wheezing or shortness of breath. 04/21/22   [name], FNP  amLODipine (NORVASC) 10 MG tablet Take 1 tablet (10 mg total) by mouth daily. 05/06/22   Lewie Chamber, MD  apixaban (ELIQUIS) 5 MG TABS tablet Take 1 tablet (5 mg total) by mouth 2 (two) times daily. 05/05/22   Lewie Chamber, MD  atorvastatin (LIPITOR) 40 MG tablet Take one tablet by mouth daily on Monday through Friday. 02/10/22   Dorothyann Peng, MD  calcium acetate (PHOSLO) 667 MG capsule Take 2 capsules (1,334 mg total) by mouth 3 (three) times daily with meals. 05/05/22   Lewie Chamber, MD  carvedilol (COREG) 25 MG tablet Take 1 tablet (25 mg total) by mouth 2 (two) times daily. 03/03/22   Dorothyann Peng, MD  colchicine 0.6 MG tablet Take 1 tablet only on Tuesday and Friday 05/05/22   Lewie Chamber, MD  hydrALAZINE  (APRESOLINE) 25 MG tablet Take 25 mg by mouth daily. 11/04/21   [provider]  latanoprost (XALATAN) 0.005 % ophthalmic solution Place 1 drop into both eyes at bedtime. 09/01/16   [provider]  multivitamin (RENA-VIT) TABS tablet Take 1 tablet by mouth at bedtime. 05/05/22   Lewie Chamber, MD  polyethylene glycol (MIRALAX / GLYCOLAX) 17 g packet Take 17 g by mouth daily as needed for mild constipation or moderate constipation. 05/05/22   Lewie Chamber, MD  senna-docusate (SENOKOT-S) 8.6-50 MG tablet Take 1 tablet by mouth 2 (two) times daily as needed for mild constipation. 05/05/22   Lewie Chamber, MD  timolol (TIMOPTIC-XR) 0.5 % ophthalmic gel-forming Place 1 drop into both eyes in the morning.    [provider]  vitamin C (ASCORBIC ACID) 500 MG tablet Take 1,000 mg by mouth daily.    [provider]  Vitamin D, Ergocalciferol, (DRISDOL) 1.25 MG (50000 UNIT) CAPS capsule TAKE 1 CAPSULE BY MOUTH EVERY 7 DAYS Patient taking differently: Take 50,000 Units by mouth every 7 (seven) days. 03/19/22   Dorothyann Peng, MD      Allergies    Atorvastatin and Shellfish allergy    Review of Systems   Review of Systems  Neurological:  Positive for weakness.    Physical Exam Updated Vital Signs BP 96/67  Pulse 97   Temp (!) 94.5 F (34.7 C)   Resp 20   SpO2 95%  Physical Exam Vitals and nursing note reviewed.  Constitutional:      General: She is not in acute distress.    Appearance: She is well-developed. She is not diaphoretic.  HENT:     Head: Normocephalic and atraumatic.  Eyes:     Pupils: Pupils are equal, round, and reactive to light.  Cardiovascular:     Heart sounds: No murmur heard.    No friction rub. No gallop.  Pulmonary:     Effort: Pulmonary effort is normal.     Breath sounds: No wheezing or rales.  Abdominal:     General: There is no distension.     Palpations: Abdomen is soft.     Tenderness: There is no abdominal tenderness.   Musculoskeletal:        General: No tenderness.     Cervical back: Normal range of motion and neck supple.  Skin:    General: Skin is warm and dry.  Psychiatric:        Behavior: Behavior normal.     ED Results / Procedures / Treatments   Labs (all labs ordered are listed, but only abnormal results are displayed) Labs Reviewed  CBC - Abnormal; Notable for the following components:      Result Value   WBC 17.1 (*)    RBC 3.09 (*)    Hemoglobin 8.2 (*)    HCT 29.4 (*)    MCHC 27.9 (*)    RDW 17.6 (*)    nRBC 1.1 (*)    All other components within normal limits  I-STAT VENOUS BLOOD GAS, ED - Abnormal; Notable for the following components:   pH, Ven 7.177 (*)    pO2, Ven 102 (*)    Bicarbonate 18.0 (*)    TCO2 19 (*)    Acid-base deficit 10.0 (*)    Potassium 3.4 (*)    Calcium, Ion 0.57 (*)    HCT 27.0 (*)    Hemoglobin 9.2 (*)    All other components within normal limits  CULTURE, BLOOD (ROUTINE X 2)  CULTURE, BLOOD (ROUTINE X 2)  TRIGLYCERIDES  BASIC METABOLIC PANEL  LACTIC ACID, PLASMA  LACTIC ACID, PLASMA  HEPATIC FUNCTION PANEL  BLOOD GAS, ARTERIAL  TROPONIN I (HIGH SENSITIVITY)  TROPONIN I (HIGH SENSITIVITY)    EKG None  Radiology DG Chest Portable 1 View  Result Date: 05/11/2022 CLINICAL DATA:  Intubation EXAM: PORTABLE CHEST 1 VIEW COMPARISON:  Portable exam 1756 hours compared to 04/30/2022 FINDINGS: Tip of endotracheal tube projects 5.2 cm above carina. Nasogastric tube extends into stomach. RIGHT jugular dual-lumen central venous catheter with tip projecting over SVC. External pacing leads project over chest. Enlargement of cardiac silhouette. LEFT lower lobe consolidation with additional mild LEFT upper lobe infiltrate. Trace RIGHT pleural effusion. No pneumothorax. IMPRESSION: LEFT lower lobe consolidation with additional mild infiltrate in LEFT upper lobe. Electronically Signed   By: Ulyses Southward M.D.   On: 06/04/2022 18:14    Procedures .Critical  Care  Performed by: Melene Plan, DO Authorized by: Melene Plan, DO   Critical care provider statement:    Critical care time (minutes):  80   Critical care time was exclusive of:  Separately billable procedures and treating other patients   Critical care was time spent personally by me on the following activities:  Development of treatment plan with patient or surrogate, discussions with consultants, evaluation of  patient's response to treatment, examination of patient, ordering and review of laboratory studies, ordering and review of radiographic studies, ordering and performing treatments and interventions, pulse oximetry, re-evaluation of patient's condition and review of old charts   Care discussed with: admitting provider   Procedure Name: Intubation Date/Time: 05/10/2022 6:13 PM  Performed by: Melene Plan, DOPre-anesthesia Checklist: Patient identified, Patient being monitored, Emergency Drugs available, Timeout performed and Suction available Oxygen Delivery Method: Non-rebreather mask Preoxygenation: Pre-oxygenation with 100% oxygen Ventilation: Mask ventilation without difficulty Laryngoscope Size: Glidescope Grade View: Grade I Tube size: 7.5 mm Number of attempts: 1 Airway Equipment and Method: Video-laryngoscopy Placement Confirmation: ETT inserted through vocal cords under direct vision, CO2 detector and Breath sounds checked- equal and bilateral Secured at: 26 cm Tube secured with: ETT holder Dental Injury: Teeth and Oropharynx as per pre-operative assessment  Difficulty Due To: Difficulty was anticipated Future Recommendations: Recommend- induction with short-acting agent, and alternative techniques readily available     Cardiopulmonary Resuscitation (CPR) Procedure Note Directed/Performed by: Rae Roam I personally directed ancillary staff and/or performed CPR in an effort to regain return of spontaneous circulation and to maintain cardiac, neuro and systemic  perfusion.     EMERGENCY DEPARTMENT [place] CARDIAC EXAM "Study: Limited Ultrasound of the Heart and Pericardium"  INDICATIONS:Cardiac arrest Multiple views of the heart and pericardium were obtained in real-time with a multi-frequency probe.  PERFORMED ZO:XWRUEA IMAGES ARCHIVED?: Yes LIMITATIONS:  Body habitus and Emergent procedure VIEWS USED: Subcostal 4 chamber and Apical 4 chamber  INTERPRETATION: Cardiac activity present, Pericardial effusion present, Cardiac tamponade present, Probable elevated CVP, Decreased contractility, IVC dilated, Agonal contractions noted, and Repeat exams, initially with no cardiac activity, repeated with some concern for decreased filling in the R ventricle concerning for tamponade.    Medications Ordered in ED Medications  EPINEPHrine (ADRENALIN) 1 MG/10ML injection (1 mg Intravenous Given 06/01/2022 1740)  EPINEPHrine (ADRENALIN) 1 MG/10ML injection (1 mg Intravenous Given 05/26/2022 1736)  dextrose 50 % solution (25 g Intravenous Given 05/24/2022 1730)  naloxone (NARCAN) injection (2 mg Intravenous Given 05/06/2022 1733)  calcium chloride injection (1 g Intravenous Given 05/06/2022 1737)  sodium bicarbonate injection (50 mEq Intravenous Given 05/11/2022 1845)  propofol (DIPRIVAN) 1000 MG/100ML infusion (0 mcg/kg/min  65.9 kg Intravenous Stopped 05/09/2022 1833)  fentaNYL (SUBLIMAZE) injection 25 mcg (25 mcg Intravenous Not Given 05/20/2022 1845)  fentaNYL in NS (7mcg/ml) infusion-PREMIX (50 mcg/hr Intravenous Infusion Verify 05/06/2022 1856)  fentaNYL (SUBLIMAZE) bolus via infusion 25-100 mcg (has no administration in time range)  norepinephrine (LEVOPHED) 4mg  in (0.016 mg/mL) premix infusion (40 mcg/min Intravenous New Bag/Given 05/16/2022 1934)  vancomycin (VANCOREADY) IVPB 1250 mg/250 mL (1,250 mg Intravenous New Bag/Given 05/24/2022 1927)  midazolam (VERSED) injection 2 mg (2 mg Intravenous Given 06/03/2022 1832)  piperacillin-tazobactam (ZOSYN) IVPB 3.375 g (0 g Intravenous  Stopped 05/27/2022 1914)  sodium chloride 0.9 % bolus 1,000 mL (1,000 mLs Intravenous New Bag/Given 06/04/2022 1857)    ED Course/ Medical Decision Making/ A&P                             Medical Decision Making Amount and/or Complexity of Data Reviewed Labs: ordered. Radiology: ordered.  Risk Prescription drug management. Decision regarding hospitalization.   72 yo F with a chief complaints of fatigue and difficulty breathing.  This was reported over the past couple days by EMS.  The patient unfortunately had arrested within about 5 minutes of arrival here.  CPR was performed for about 20 minutes and ROSC was obtained.  Patient has a known pericardial effusion believed to be uremic.  Bedside ultrasound during CPR with complete lack of cardiac activity though after ROSC repeat ultrasound with some signs of tamponade on my view.  I discussed this with cardiology, Dr. Jovita Gamma to come see the patient at bedside with a stat echo.  Will obtain blood work.  Will discuss with ICU.  The patients results and plan were reviewed and discussed.   Any x-rays performed were independently reviewed by myself.   Differential diagnosis were considered with the presenting HPI.  Medications  EPINEPHrine (ADRENALIN) 1 MG/10ML injection (1 mg Intravenous Given 05/17/2022 1740)  EPINEPHrine (ADRENALIN) 1 MG/10ML injection (1 mg Intravenous Given 05/21/2022 1736)  dextrose 50 % solution (25 g Intravenous Given 05/26/2022 1730)  naloxone (NARCAN) injection (2 mg Intravenous Given 05/25/2022 1733)  calcium chloride injection (1 g Intravenous Given 05/30/2022 1737)  sodium bicarbonate injection (50 mEq Intravenous Given 05/06/2022 1845)  propofol (DIPRIVAN) 1000 MG/100ML infusion (0 mcg/kg/min  65.9 kg Intravenous Stopped 05/28/2022 1833)  fentaNYL (SUBLIMAZE) injection 25 mcg (25 mcg Intravenous Not Given 05/20/2022 1845)  fentaNYL in NS (46mcg/ml) infusion-PREMIX (50 mcg/hr Intravenous Infusion Verify 05/17/2022 1856)  fentaNYL  (SUBLIMAZE) bolus via infusion 25-100 mcg (has no administration in time range)  norepinephrine (LEVOPHED) 4mg  in (0.016 mg/mL) premix infusion (40 mcg/min Intravenous New Bag/Given 05/18/2022 1934)  vancomycin (VANCOREADY) IVPB 1250 mg/250 mL (1,250 mg Intravenous New Bag/Given 05/28/2022 1927)  midazolam (VERSED) injection 2 mg (2 mg Intravenous Given 05/15/2022 1832)  piperacillin-tazobactam (ZOSYN) IVPB 3.375 g (0 g Intravenous Stopped 06/04/2022 1914)  sodium chloride 0.9 % bolus 1,000 mL (1,000 mLs Intravenous New Bag/Given 05/29/2022 1857)    Vitals:   05/25/2022 1933 05/06/2022 1936 06/03/2022 1938 05/28/2022 1942  BP: 92/62 (!) 69/53 97/62 96/67   Pulse: 94  85 97  Resp: (!) 28 (!) 24 (!) 23 20  Temp: (!) 94.5 F (34.7 C) (!) 94.5 F (34.7 C) (!) 94.5 F (34.7 C) (!) 94.5 F (34.7 C)  SpO2: 93%  95% 95%    Final diagnoses:  Cardiac arrest (HCC)  Pericardial effusion    Admission/ observation were discussed with the admitting physician, patient and/or family and they are comfortable with the plan.          Final Clinical Impression(s) / ED Diagnoses Final diagnoses:  Cardiac arrest [facility])  Pericardial effusion    Rx / DC Orders ED Discharge Orders     None         Melene Plan, DO 05/30/2022 1950    Melene Plan, DO 05/11/2022 1952

## 2022-06-06 NOTE — Consult Note (Addendum)
CARDIOLOGY CONSULT NOTE  Patient ID: Michelle Cummings MRN: [id number] DOB/AGE: May 09, 1950 72 y.o.  Admit date: 05/25/2022 Referring Physician  Melene Plan, MD Primary Physician:  Dorothyann Peng, MD Reason for Consultation  Hypotension, C. Arrest  Patient ID: Michelle Cummings, female    DOB: 1950-03-16, 72 y.o.   MRN: [id number]  Chief Complaint  Patient presents with   Weakness   HPI:    Michelle Cummings  is a 73 y.o. patient with chronic kidney disease presently new onset dialysis when she was recently admitted to the hospital on 04/29/2022 and discharged on 05/05/2022 when she was treated for urinary pericarditis, paroxysmal atrial fibrillation, left arm AV fistula placement on 04/25/2022.  She was sent from the dialysis center complaining of marked fatigue, hypotension and not feeling well, upon presentation to the emergency room, patient had a seizure episode followed by asystole needing CPR for 20 minutes and was intubated during CPR and also started on pressors.  Bedside echocardiogram revealed moderate to large pericardial effusion.  I was consulted for management of the same.  Patient is intubated and sedated.  Past Medical History:  Diagnosis Date   Anemia    IDA   Arthritis    Cataracts, bilateral    MD just watching, no surgery as of 04/29/22   Cervical disc disease    Chronic kidney disease    Dr. Antoine Poche yearly for CKD stage II as of 11/26/11   Diabetes mellitus (HCC)    type 2 - diet controlled, no meds   Dyspnea    with exertion   Eczema    Glaucoma    bilateral   Heart murmur    never has caused any problems per patient on 04/29/22   History of chicken pox    Hyperlipidemia    Hypertension    Seasonal allergies    Thyroid nodule    Past Surgical History:  Procedure Laterality Date   AV FISTULA PLACEMENT Left 04/30/2022   Procedure: LEFT ARM CEPHALIC VEIN ARTERIOVENOUS (AV) FISTULA CREATION TDC;  Surgeon: Maeola Harman, MD;  Location: [facility] OR;  Service:  Vascular;  Laterality: Left;   CESAREAN SECTION  01/06/1976   COLONOSCOPY     INSERTION OF DIALYSIS CATHETER Right 04/30/2022   Procedure: INSERTION OF PALINDROME 19CM DIALYSIS CATHETER;  Surgeon: Maeola Harman, MD;  Location: [facility] OR;  Service: Vascular;  Laterality: Right;   ORIF ANKLE FRACTURE  11/05/2011   Procedure: Left OPEN REDUCTION INTERNAL FIXATION (ORIF) ANKLE FRACTURE;  Surgeon: Toni Arthurs, MD;  Location: [facility];  Service: Orthopedics;  Laterality: Left;  OPEN REDUCTION INTERNAL FIXATION LEFT LATERAL MALLEOLUS FRACTURE WITH STRESS XRAYS WITH FLOUROSCOPY   ORIF ELBOW FRACTURE  03/05/2011   Procedure: Left OPEN REDUCTION INTERNAL FIXATION (ORIF) ELBOW/OLECRANON FRACTURE;  Surgeon: Sharma Covert, MD;  Location: [facility] OR;  Service: Orthopedics;  Laterality: Left;   THYROIDECTOMY Right 07/20/2012   Procedure: RIGHT THYROID LOBECTOMY WITH ISTHMUSECTOMY;  Surgeon: Serena Colonel, MD;  Location: [facility] OR;  Service: ENT;  Laterality: Right;   TONSILLECTOMY     TUBAL LIGATION     UPPER GI ENDOSCOPY     Social History   Tobacco Use   Smoking status: Never   Smokeless tobacco: Never  Substance Use Topics   Alcohol use: No    Family History  Problem Relation Age of Onset   Hypertension Mother    Early death Father    Breast cancer Sister    Healthy Brother  Marital Status: Married  ROS  Review of Systems  Unable to perform ROS: Intubated   Objective      05/12/2022    6:36 PM 05/06/2022    6:33 PM 05/06/2022    6:31 PM  Vitals with BMI  Systolic 63 64 71  Diastolic 55 54 54    Blood pressure (!) 63/55, pulse 94, temperature (!) 94.6 F (34.8 C), resp. rate (!) 22, SpO2 100 %.   Physical Exam Constitutional:      Interventions: She is sedated and intubated.  Neck:     Vascular: No carotid bruit.  Cardiovascular:     Rate and Rhythm: Normal rate and regular rhythm.     Pulses:          Dorsalis pedis pulses are 0 on the right side and 0 on the  left side.       Posterior tibial pulses are 0 on the right side and 0 on the left side.     Heart sounds: Normal heart sounds. No murmur heard.    No gallop.  Pulmonary:     Effort: She is intubated.     Breath sounds: Examination of the left-upper field reveals rhonchi. Examination of the left-middle field reveals rhonchi. Examination of the left-lower field reveals rhonchi. Rhonchi present.  Abdominal:     General: Bowel sounds are normal.     Palpations: Abdomen is soft.  Musculoskeletal:     Right lower leg: No edema.     Left lower leg: No edema.  Skin:    Comments: Cool periphery.    Laboratory examination:   Recent Labs    05/03/22 0108 05/04/22 0123 05/05/22 0109 06/03/2022 1834  NA 135 131* 133* 138  K 4.4 4.2 4.3 3.4*  CL 95* 93* 94*  --   CO2 23 24 27   --   GLUCOSE 150* 127* 142*  --   BUN 79* 92* 52*  --   CREATININE 8.21* 9.17* 6.16*  --   CALCIUM 7.3* 6.9* 7.5*  --   GFRNONAA 5* 4* 7*  --    estimated creatinine clearance is 7.6 mL/min (A) (by C-G formula based on SCr of 6.16 mg/dL (H)).     Latest Ref Rng & Units 05/21/2022    6:34 PM 05/05/2022    1:09 AM 05/04/2022    1:23 AM  CMP  Glucose 70 - 99 mg/dL  161  096   BUN 8 - 23 mg/dL  52  92   Creatinine 0.45 - 1.00 mg/dL  4.09  8.11   Sodium 914 - 145 mmol/L 138  133  131   Potassium 3.5 - 5.1 mmol/L 3.4  4.3  4.2   Chloride 98 - 111 mmol/L  94  93   CO2 22 - 32 mmol/L  27  24   Calcium 8.9 - 10.3 mg/dL  7.5  6.9       Latest Ref Rng & Units 05/18/2022    6:34 PM 05/05/2022    1:09 AM 05/04/2022    1:23 AM  CBC  WBC 4.0 - 10.5 K/uL  10.8  10.2   Hemoglobin 12.0 - 15.0 g/dL 9.2  8.3  8.2   Hematocrit 36.0 - 46.0 % 27.0  26.7  25.6   Platelets 150 - 400 K/uL  265  239    Lipid Panel Recent Labs    11/04/21 1001  CHOL 243*  TRIG 73  LDLCALC 149*  HDL 82  CHOLHDL 3.0  HEMOGLOBIN A1C Lab Results  Component Value Date   HGBA1C 6.0 (H) 02/10/2022   TSH Recent Labs    04/30/22 0124   TSH 3.395   BNP (last 3 results) Recent Labs    04/21/22 2207 04/29/22 1843  BNP 1,129.4* 496.4*   Cardiac Panel (last 3 results) No results for input(s): "CKTOTAL", "CKMB", "TROPONINIHS", "RELINDX" in the last 72 hours.   Medications and allergies   Allergies  Allergen Reactions   Atorvastatin Other (See Comments)    Myalgias- pt stopped taking    Shellfish Allergy Itching     No outpatient medications have been marked as taking for the 05/31/2022 encounter [facility] Encounter).    Scheduled Meds:  fentaNYL (SUBLIMAZE) injection  25 mcg Intravenous Once   Continuous Infusions:  fentaNYL infusion INTRAVENOUS 25 mcg/hr (05/21/2022 1838)   norepinephrine (LEVOPHED) Adult infusion 60 mcg/min (05/16/2022 1838)   piperacillin-tazobactam     propofol (DIPRIVAN) infusion Stopped (05/11/2022 1833)   vancomycin     PRN Meds:.calcium chloride, dextrose, EPINEPHrine, EPINEPHrine, fentaNYL, naloxone, sodium bicarbonate   No intake/output data recorded. Total I/O In: 58.8 [I.V.:58.8] Out: 0   Net IO Since Admission: 58.75 mL [06/02/2022 1846]  Radiology:   DG Chest Portable 1 View  Result Date: 05/24/2022 CLINICAL DATA:  Intubation EXAM: PORTABLE CHEST 1 VIEW COMPARISON:  Portable exam 1756 hours compared to 04/30/2022 FINDINGS: Tip of endotracheal tube projects 5.2 cm above carina. Nasogastric tube extends into stomach. RIGHT jugular dual-lumen central venous catheter with tip projecting over SVC. External pacing leads project over chest. Enlargement of cardiac silhouette. LEFT lower lobe consolidation with additional mild LEFT upper lobe infiltrate. Trace RIGHT pleural effusion. No pneumothorax. IMPRESSION: LEFT lower lobe consolidation with additional mild infiltrate in LEFT upper lobe. Electronically Signed   By: Ulyses Southward M.D.   On: 05/27/2022 18:14    Cardiac Studies:   Echocardiogram 05/04/2022: 1. Left ventricular ejection fraction, by estimation, is 60 to 65%. The left ventricle  has normal function. The left ventricle has no regional wall motion abnormalities. There is moderate left ventricular hypertrophy. Left ventricular diastolic  parameters are indeterminate.  2. Right ventricular systolic function is normal. The right ventricular size is normal.  3. Moderate pericardial effusion. The pericardial effusion is circumferential. There is no evidence of cardiac tamponade.  4. The mitral valve is grossly normal. Mild mitral valve regurgitation.  5. The aortic valve is normal in structure. Aortic valve regurgitation is mild to moderate. Aortic valve sclerosis is present, with no evidence of aortic valve stenosis.  6. Aortic dilatation noted. Aneurysm of the aortic arch, measuring 43 mm.  7. The inferior vena cava is dilated in size with <50% respiratory variability, suggesting right atrial pressure of 15 mmHg.    EKG:  EKG 02/06/2022: Atrial fibrillation with controlled ventricular response at the rate of 95 bpm, normal axis.  ST elevation in the inferolateral leads suggestive of acute peritonitis versus acute inferior STEMI.  Compared to 05/03/2022, ST elevation was noted previously in the inferior and lateral leads as well.  Assessment   1.  Paroxysmal atrial fibrillation with controlled ventricular response 2.  Cardiac arrest needing CPR, presenting with asystole.  No shockable rhythm.  Severe metabolic acidosis. 3.  Abnormal EKG, most indicated of acute pericarditis not largely changed from prior EKG. 4.  Pericardial effusion, moderate.  Bedside echocardiogram performed by me no obvious tamponade, no significant change in diffusion, clear fluid. 5.  Abnormal chest x-ray suggesting infiltrate/pneumonia left upper and lower lobe.  6.  End-stage renal disease new onset hemodialysis  Recommendations:   Will obtain formal echocardiogram to exclude tamponade.  Bedside echocardiogram revealed small cavity left and right ventricle, no wall motion abnormality, moderate  circumferential pericardial effusion without any thrombus or fibrinous material.  Patient's blood pressure was hovering around 60 mmHg.  Continue with pressor support, increase IV fluids, consider further workup for metabolic issues and sepsis.  Do not think that pericardial effusion draining would significantly impact or change the outcome at least for now.  Will give additional 1 ampoule of sodium bicarbonate.  Will obtain ABGs.  Patient is in atrial fibrillation but with controlled ventricular response.  I will hold off on starting IV heparin or anticoagulants for now as I am seeing some coffee-ground aspirate coming through the NG tube.  I have discussed with the nursing team, will increase her pressors, presently on norepinephrine to 80 mcg/kg/min until systolic blood pressure >90-100 mmHg was obtained, fluid resuscitation is performed.  Pulmonary critical care has also been consulted.  I am available at any time if pericardial tamponade is confirmed, I will certainly take her to the Cath Lab for drainage.  With fluid station and continued pressor support, blood pressure did improved to around 90 mmHg.  Continue the same until she is transferred to the intensive care unit.  I spent a total of 50 minutes with the patient and evaluation of her records, critical care time.   Yates Decamp, MD, [facility] 05/17/2022, 6:46 PM Office: [phone]    Addendum 7:05 PM: Patient's blood pressure now improved to 120 mmHg systolic, presently on norepinephrine at 15 mcg/kg/min.  Fluid resuscitation immediately resuscitated for blood pressure.  She has extensive crackles on her left lungs, suspect she could have aspirated as well.  She has coffee-ground aspirate from the G-tube.  Will continue to follow.   Yates Decamp, MD, [facility] 05/11/2022, 7:04 PM Office: [phone] Fax: [phone] Pager: [phone]

## 2022-06-06 NOTE — Progress Notes (Signed)
I responded to a page from the nurse to provide spiritual support for the patient's family. I arrived at the patient's room where her daughter and family friend were present. I provided spiritual support through pastoral presence, by reading scripture, sharing words of encouragement/comfort, and leading in prayer.    05/25/2022 2225  Spiritual Encounters  Type of Visit Initial  Care provided to: Pt and family  Conversation partners present during encounter Nurse;Physician  Referral source Nurse (RN/NT/LPN)  Reason for visit Urgent spiritual support  OnCall Visit Yes  Interventions  Spiritual Care Interventions Made Compassionate presence;Prayer;Encouragement    Chaplain Dr Melvyn Novas

## 2022-06-06 NOTE — Procedures (Addendum)
Intubation Procedure Note  Michelle Cummings  [id number]  1950-04-17  Date:05/20/2022  Time:6:24 PM   Provider Performing:Celicia Minahan Tarry Kos, RRT   Procedure: Intubation (31500)  Indication(s) Respiratory Failure  Consent Unable to obtain consent due to emergent nature of procedure.   Anesthesia None   Time Out Verified patient identification, verified procedure, site/side was marked, verified correct patient position, special equipment/implants available, medications/allergies/relevant history reviewed, required imaging and test results available.   Sterile Technique Usual hand hygeine, masks, and gloves were used   Procedure Description Patient positioned in bed supine.  Sedation given as noted above.  Patient was intubated with endotracheal tube using Glidescope.  View was Grade 1 full glottis .  Number of attempts was 1.  Colorimetric CO2 detector was consistent with tracheal placement. Positive color change. ED MD, Jesusita Oka floyd,DO, at bedside during intubation.    Complications/Tolerance None; patient tolerated the procedure well. Chest X-ray is ordered to verify placement.   EBL Minimal   Specimen(s) None

## 2022-06-06 NOTE — Death Summary Note (Signed)
DEATH SUMMARY   Patient Details  Name: Michelle Cummings MRN: [id number] DOB: [birth year] WUJ:[id number], Melina Schools, MD  Admission/Discharge Information   Admit Date:  05/18/2022  Date of Death: Date of Death: 18-May-2022  Time of Death: Time of Death: June 06, 2157  Length of Stay: 1   Principle Cause of death: cardiac arrest, hypoxemia  Hospital Diagnoses: Principal Problem:   Cardiac arrest [facility]) ARDS   Hospital Course: Ms. Ferrence is a 72 yo woman with hx of DM2, CKD recently started on HD, Recent admission for pericarditis and uremic effusion without tamponade, glaucoma, HLD, HTN, atrial fibrillation vs SVT, aortic arch aneurysm.  Here with weakness.   In ED suffered cardiac arrest upon arrival.  Subsequent hypotension started on levophed up to 80 mcg.   Central line placed. Given antibiotics for sepsis.   Unfortunately worsened hypotension and refractory hypoxemia, likely 2/2 ards.  Developed bradycardia and then asystolic cardiac arrest --> PEA.  Brief rosc, added epinephrine gtt, then another cardiac arrest.   Echo at bedside unchanged, no tamponade, small/mod effusion only.    Discussed with Family (daughter and son) at bedside.   Patient made DNR.    Assessment and Plan: No notes have been filed under this hospital service. Service: Hospitalist    Procedures: Central line, arterial line   Consultations: cardiology   The results of significant diagnostics from this hospitalization (including imaging, microbiology, ancillary and laboratory) are listed below for reference.   Significant Diagnostic Studies: DG Chest Port 1 View  Result Date: 05-18-22 CLINICAL DATA:  Chest pain EXAM: PORTABLE CHEST 1 VIEW COMPARISON:  05-18-2022, 04/30/2022, 04/29/2022, 04/21/2022 FINDINGS: Endotracheal tube tip is about 4.7 cm superior to carina. Esophageal tube tip below the diaphragm but incompletely included. Right IJ central venous catheter tip over the SVC. Left IJ central venous catheter tip over the  SVC. Cardiomegaly with pleural effusions and hazy bilateral pulmonary airspace disease, worse compared to prior. Dense left lung base consolidation. IMPRESSION: 1. Support lines and tubes as above. 2. Cardiomegaly with pleural effusions and hazy bilateral pulmonary airspace disease, worse compared to prior. Findings could be secondary to pulmonary edema or pneumonia. Persistent dense left lung base consolidation Electronically Signed   By: Jasmine Pang M.D.   On: May 18, 2022 21:38   ECHOCARDIOGRAM LIMITED  Result Date: 05-18-22    ECHOCARDIOGRAM LIMITED REPORT   Patient Name:   Michelle Cummings Brideau Date of Exam: 05/18/2022 Medical Rec #:  [id number]    Height:       64.0 in Accession #:    [id number]   Weight:       145.3 lb Date of Birth:  1950-07-08    BSA:          1.708 m Patient Age:    72 years     BP:           97/62 mmHg Patient Gender: F            HR:           98 bpm. Exam Location:  Inpatient Procedure: Limited Echo, Limited Color Doppler and Cardiac Doppler STAT ECHO Indications:     cardiac arrest. Pericardial effusion.  History:         Patient has prior history of Echocardiogram examinations, most                  recent 05/04/2022. Chronic kidney disease; Risk  Factors:Hypertension, Diabetes and Dyslipidemia.  Sonographer:     Delcie Roch RDCS Referring Phys:  [id number] DAN FLOYD Diagnosing Phys: Tessa Lerner DO IMPRESSIONS  1. Patient s/p cardiac arrest with known history of moderate pericardial effusion. Currently on vasopressors.  2. Left ventricular ejection fraction, by estimation, is >75%. The left ventricle has hyperdynamic function. The left ventricle has no regional wall motion abnormalities. There is moderate left ventricular hypertrophy. Left ventricular diastolic function could not be evaluated.  3. Right ventricular systolic function is low normal. The right ventricular size is normal. Mildly increased right ventricular wall thickness. There is mildly elevated pulmonary  artery systolic pressure.  4. Left atrial size was grossly normal in size.  5. Right atrial size was grossly normal in size.  6. Moderate pericardial effusion. The pericardial effusion is circumferential. There is no evidence of cardiac tamponade.  7. The mitral valve is grossly normal. No evidence of mitral valve regurgitation. No evidence of mitral stenosis.  8. The aortic valve is tricuspid. Aortic valve regurgitation is mild. No aortic stenosis is present.  9. The inferior vena cava is dilated in size with <50% respiratory variability, suggesting right atrial pressure of 15 mmHg. 10. Rhythm strip during this exam demonstrates atrial fibrillation. Comparison(s): A prior study was performed on 05/04/2022. LVEF 60-65%, moderate LVH, moderate pericardial effusion without tamponade, mild MR, mild to moderate AR, aortic arch 43mm, estimated RAP . FINDINGS  Left Ventricle: Left ventricular ejection fraction, by estimation, is >75%. The left ventricle has hyperdynamic function. The left ventricle has no regional wall motion abnormalities. There is moderate left ventricular hypertrophy. Left ventricular diastolic function could not be evaluated. Left ventricular diastolic function could not be evaluated due to atrial fibrillation. Right Ventricle: The right ventricular size is normal. Mildly increased right ventricular wall thickness. Right ventricular systolic function is low normal. There is mildly elevated pulmonary artery systolic pressure. The tricuspid regurgitant velocity is 2.87 m/s, and with an assumed right atrial pressure of 15 mmHg, the estimated right ventricular systolic pressure is 47.9 mmHg. Left Atrium: Left atrial size was grossly normal in size. Right Atrium: Right atrial size was grossly normal in size. Pericardium: A moderately sized pericardial effusion is present. The pericardial effusion is circumferential. There is no evidence of cardiac tamponade. Mitral Valve: The mitral valve is grossly  normal. No evidence of mitral valve stenosis. Tricuspid Valve: The tricuspid valve is grossly normal. Tricuspid valve regurgitation is mild . No evidence of tricuspid stenosis. Aortic Valve: The aortic valve is tricuspid. Aortic valve regurgitation is mild. No aortic stenosis is present. Pulmonic Valve: The pulmonic valve was grossly normal. Pulmonic valve regurgitation is trivial. No evidence of pulmonic stenosis. Aorta: The aortic root and ascending aorta are structurally normal, with no evidence of dilitation and the aortic arch was not well visualized. Venous: The inferior vena cava is dilated in size with less than 50% respiratory variability, suggesting right atrial pressure of 15 mmHg. IAS/Shunts: The interatrial septum was not well visualized. EKG: Rhythm strip during this exam demonstrates atrial fibrillation. Additional Comments: Spectral Doppler performed. Color Doppler performed.  LEFT VENTRICLE PLAX 2D LVIDd:         2.80 cm LVIDs:         1.70 cm LV PW:         1.60 cm LV IVS:        1.10 cm  IVC IVC diam: 2.40 cm LEFT ATRIUM         Index LA diam:  2.80 cm 1.64 cm/m  AORTIC VALVE LVOT Vmax:   126.00 cm/s LVOT Vmean:  63.500 cm/s LVOT VTI:    0.151 m  AORTA Ao Asc diam: 3.50 cm TRICUSPID VALVE TR Peak grad:   32.9 mmHg TR Vmax:        287.00 cm/s  SHUNTS Systemic VTI: 0.15 m Sunit Tolia DO Electronically signed by Tessa Lerner DO Signature Date/Time: May 22, 2022/8:25:26 PM    Final    DG Chest Portable 1 View  Result Date: 05-22-22 CLINICAL DATA:  Intubation EXAM: PORTABLE CHEST 1 VIEW COMPARISON:  Portable exam 1756 hours compared to 04/30/2022 FINDINGS: Tip of endotracheal tube projects 5.2 cm above carina. Nasogastric tube extends into stomach. RIGHT jugular dual-lumen central venous catheter with tip projecting over SVC. External pacing leads project over chest. Enlargement of cardiac silhouette. LEFT lower lobe consolidation with additional mild LEFT upper lobe infiltrate. Trace RIGHT pleural  effusion. No pneumothorax. IMPRESSION: LEFT lower lobe consolidation with additional mild infiltrate in LEFT upper lobe. Electronically Signed   By: Ulyses Southward M.D.   On: 2022-05-22 18:14   ECHOCARDIOGRAM COMPLETE  Result Date: 05/05/2022    ECHOCARDIOGRAM REPORT   Patient Name:   SHARUNDA RANSAW Pickron Date of Exam: 05/04/2022 Medical Rec #:  [id number]    Height:       64.0 in Accession #:    [id number]   Weight:       145.3 lb Date of Birth:  [birth year]    BSA:          1.708 m Patient Age:    72 years     BP:           126/65 mmHg Patient Gender: F            HR:           80 bpm. Exam Location:  Inpatient Procedure: 2D Echo, Cardiac Doppler and Color Doppler Indications:    A-Fib  History:        Patient has no prior history of Echocardiogram examinations. CKD                 3, Arrythmias:Atrial Fibrillation; Risk Factors:Dyslipidemia,                 Diabetes, Hypertension and Non-Smoker.  Sonographer:    Dondra Prader RVT RCS Referring Phys: [phone] DAVID GIRGUIS IMPRESSIONS  1. Left ventricular ejection fraction, by estimation, is 60 to 65%. The left ventricle has normal function. The left ventricle has no regional wall motion abnormalities. There is moderate left ventricular hypertrophy. Left ventricular diastolic parameters are indeterminate.  2. Right ventricular systolic function is normal. The right ventricular size is normal.  3. Moderate pericardial effusion. The pericardial effusion is circumferential. There is no evidence of cardiac tamponade.  4. The mitral valve is grossly normal. Mild mitral valve regurgitation.  5. The aortic valve is normal in structure. Aortic valve regurgitation is mild to moderate. Aortic valve sclerosis is present, with no evidence of aortic valve stenosis.  6. Aortic dilatation noted. Aneurysm of the aortic arch, measuring 43 mm.  7. The inferior vena cava is dilated in size with <50% respiratory variability, suggesting right atrial pressure of 15 mmHg. Comparison(s): No prior  Echocardiogram. FINDINGS  Left Ventricle: Left ventricular ejection fraction, by estimation, is 60 to 65%. The left ventricle has normal function. The left ventricle has no regional wall motion abnormalities. The left ventricular internal cavity size was normal in size. There is  moderate  left ventricular hypertrophy. Left ventricular diastolic parameters are indeterminate. Right Ventricle: The right ventricular size is normal. No increase in right ventricular wall thickness. Right ventricular systolic function is normal. Left Atrium: Left atrial size was normal in size. Right Atrium: Right atrial size was normal in size. Pericardium: A moderately sized pericardial effusion is present. The pericardial effusion is circumferential. There is no evidence of cardiac tamponade. Mitral Valve: The mitral valve is grossly normal. Mild mitral valve regurgitation. Tricuspid Valve: The tricuspid valve is grossly normal. Tricuspid valve regurgitation is mild. Aortic Valve: The aortic valve is normal in structure. Aortic valve regurgitation is mild to moderate. Aortic regurgitation PHT measures 429 msec. Aortic valve sclerosis is present, with no evidence of aortic valve stenosis. Aortic valve mean gradient measures 4.0 mmHg. Aortic valve peak gradient measures 7.5 mmHg. Pulmonic Valve: The pulmonic valve was normal in structure. Pulmonic valve regurgitation is not visualized. No evidence of pulmonic stenosis. Aorta: The aortic root is normal in size and structure and aortic dilatation noted. There is an aneurysm involving the aortic arch measuring 43 mm. Venous: The inferior vena cava is dilated in size with less than 50% respiratory variability, suggesting right atrial pressure of 15 mmHg. IAS/Shunts: The interatrial septum was not assessed.  LEFT VENTRICLE PLAX 2D LVIDd:         3.30 cm   Diastology LVIDs:         2.20 cm   LV e' medial:    8.38 cm/s LV PW:         1.50 cm   LV E/e' medial:  7.5 LV IVS:        0.80 cm   LV e'  lateral:   4.79 cm/s LVOT diam:     1.80 cm   LV E/e' lateral: 13.2 LVOT Area:     2.54 cm  RIGHT VENTRICLE             IVC RV Basal diam:  3.65 cm     IVC diam: 2.60 cm RV Mid diam:    2.70 cm RV S prime:     14.00 cm/s LEFT ATRIUM             Index        RIGHT ATRIUM           Index LA diam:        3.00 cm 1.76 cm/m   RA Area:     14.50 cm LA Vol (A2C):   54.3 ml 31.79 ml/m  RA Volume:   36.00 ml  21.08 ml/m LA Vol (A4C):   44.6 ml 26.11 ml/m LA Biplane Vol: 50.1 ml 29.33 ml/m  AORTIC VALVE                   PULMONIC VALVE AV Vmax:           136.67 cm/s PV Vmax:       0.90 m/s AV Vmean:          92.667 cm/s PV Peak grad:  3.2 mmHg AV VTI:            0.245 m AV Peak Grad:      7.5 mmHg AV Mean Grad:      4.0 mmHg AI PHT:            429 msec AR Vena Contracta: 0.50 cm  AORTA Ao Root diam: 3.00 cm Ao Asc diam:  3.50 cm Ao Arch diam: 4.3 cm MITRAL VALVE  TRICUSPID VALVE MV Area (PHT): 3.23 cm    TR Peak grad:   26.4 mmHg MV Decel Time: 235 msec    TR Vmax:        257.00 cm/s MV E velocity: 63.27 cm/s MV A velocity: 78.93 cm/s  SHUNTS MV E/A ratio:  0.80        Systemic Diam: 1.80 cm Truett Mainland MD Electronically signed by Truett Mainland MD Signature Date/Time: 05/05/2022/9:30:36 AM    Final    DG Chest Port 1 View  Result Date: 04/30/2022 CLINICAL DATA:  Status post dialysis catheter insertion EXAM: PORTABLE CHEST 1 VIEW COMPARISON:  X-ray 04/29/2022 FINDINGS: Enlarged cardiopericardial silhouette with some slight central vascular congestion. Question trace edema. Small left effusion with some adjacent more focal opacity. Atelectasis versus infiltrate. No pneumothorax. No right-sided effusion. There is right IJ double-lumen catheter with tip along the central SVC. Diffuse degenerative changes of the spine. IMPRESSION: New right IJ catheter.  No pneumothorax. Enlarged heart with some central vascular congestion. Question trace edema. Left retrocardiac opacity and small effusion.   Recommend follow-up Electronically Signed   By: Karen Kays M.D.   On: 04/30/2022 15:39   DG C-Arm 1-60 Min-No Report  Result Date: 04/30/2022 Fluoroscopy was utilized by the requesting physician.  No radiographic interpretation.   DG Chest 1 View  Result Date: 04/29/2022 CLINICAL DATA:  Shortness of breath EXAM: CHEST  1 VIEW COMPARISON:  04/21/2022 FINDINGS: Transverse diameter of heart is increased. Central pulmonary vessels are prominent. There are no signs of alveolar pulmonary edema. Small bilateral pleural effusions are seen, more so on the left side with interval increase. Evaluation of lower lung fields for infiltrates is limited by pleural effusions. There is no pneumothorax. IMPRESSION: Cardiomegaly. Central pulmonary vessels are prominent without signs of alveolar pulmonary edema. Small bilateral pleural effusions, more so on the left side with interval increase. Electronically Signed   By: Ernie Avena M.D.   On: 04/29/2022 19:17   DG Chest 2 View  Result Date: 04/21/2022 CLINICAL DATA:  Chest pain EXAM: CHEST - 2 VIEW COMPARISON:  04/04/2022 FINDINGS: The cardiac silhouette, mediastinal and hilar contours are within normal limits and stable. Streaky bibasilar atelectasis but no infiltrates or effusions. No pneumothorax. The bony thorax is intact. IMPRESSION: Streaky bibasilar atelectasis. Electronically Signed   By: Rudie Meyer M.D.   On: 04/21/2022 16:42    Microbiology: Recent Results (from the past 240 hour(s))  SARS Coronavirus 2 by RT PCR (hospital order, performed in [facility] hospital lab) *cepheid single result test* Anterior Nasal Swab     Status: None   Collection Time: 04/29/22  6:35 PM   Specimen: Anterior Nasal Swab  Result Value Ref Range Status   SARS Coronavirus 2 by RT PCR NEGATIVE NEGATIVE Final    Comment: Performed at [facility], [address] [place], [place] [address]    Time spent: 75 minutes  Signed: Charlotte Sanes,  MD 06/01/2022

## 2022-06-06 NOTE — ED Triage Notes (Signed)
Patient from dialysis for eval of weakness. Completed tx. Patient arrived to room and physician called to room for possible seizure. On arrival patient pulseless and apneic. CPR initiated at 1720.

## 2022-06-06 NOTE — Code Documentation (Signed)
Atropine 

## 2022-06-06 NOTE — Code Documentation (Addendum)
Patient time of death occurred at 23-May-2157 by Melene Plan, DO

## 2022-06-06 NOTE — Progress Notes (Signed)
  Echocardiogram 2D Echocardiogram has been performed.  Delcie Roch 06/05/2022, 7:46 PM

## 2022-06-06 NOTE — Code Documentation (Signed)
Bicarb  

## 2022-06-06 NOTE — ED Notes (Signed)
Bicarb in  

## 2022-06-06 NOTE — ED Notes (Signed)
Epi to per MD verbal order

## 2022-06-06 NOTE — Patient Outreach (Signed)
  Care Coordination   Follow Up Visit Note   05/28/2022 Name: Michelle Cummings MRN: [id number] DOB: 07-15-1950  Received notification from CMA that patient called office this morning and requested call back from nurse. Return call to patient. She reports that she is "feeling weaker today and having more difficulty getting around." She is wondering if she should still go to dialysis. Discussed with pt the importance of adhering to dialysis schedule to reduce fluid overload and avoid exacerbation of sxs. She agrees and states she will go to treatment as scheduled. She confirms she has a walker and cane there to use. She thinks she has a wheelchair as well but is unsure and will have family look for it. Patient voices that daughter is going to take her to dialysis treatment today. Encouraged pt to consider HHPT for strengthening. She voices she will think about it and discuss with daughter. She will call back with any further concerns/issues.       Care Coordination Interventions:  Yes, provided    Follow up plan:  Assigned RN CM will make outreach call to patient as scheduled.     Encounter Outcome:  Pt. Visit Completed   Alessandra Grout [facility] Care Management Care Management Community Coordinator Direct Phone: [phone] Toll Free: [phone] Fax: [phone]

## 2022-06-06 NOTE — ED Notes (Signed)
Bicarb  

## 2022-06-06 NOTE — Telephone Encounter (Signed)
   Telephone encounter was:  Successful.  05/25/2022 Name: Michelle Cummings MRN: [id number] DOB: 03-03-1950  Michelle Cummings is a 72 y.o. year old female who is a primary care patient of Dorothyann Peng, MD . The community resource team was consulted for assistance with Transportation Needs   Care guide performed the following interventions: Patient provided with information about care guide support team and interviewed to confirm resource needs. Patient and daughter stated she will need transportation for her dialysis appointments. Daughter is going to call insurance to see if she has transportation benefits and call me back to set up if she does not  Follow Up Plan:  Care guide will follow up with patient by phone over the next day    Lenard Forth [facility] Guide, [facility] [phone] [address] [place], [place], [place] [address] Phone: [phone] Email: [email or web address]

## 2022-06-06 DEATH — deceased

## 2022-06-10 ENCOUNTER — Encounter (HOSPITAL_COMMUNITY): Payer: Medicare PPO

## 2022-06-25 ENCOUNTER — Ambulatory Visit: Payer: Medicare PPO | Admitting: Internal Medicine

## 2022-09-02 ENCOUNTER — Ambulatory Visit: Payer: Medicare PPO | Admitting: Podiatry

## 2022-11-19 ENCOUNTER — Encounter: Payer: Self-pay | Admitting: Internal Medicine
